# Patient Record
Sex: Female | Born: 1955 | Race: White | Hispanic: No | Marital: Married | Smoking: Current every day smoker
Health system: Southern US, Community
[De-identification: ages and names within clinical notes are randomized; demographics above are authoritative.]

## PROBLEM LIST (undated history)

## (undated) DIAGNOSIS — M1990 Unspecified osteoarthritis, unspecified site: Secondary | ICD-10-CM

## (undated) DIAGNOSIS — K219 Gastro-esophageal reflux disease without esophagitis: Secondary | ICD-10-CM

## (undated) DIAGNOSIS — K644 Residual hemorrhoidal skin tags: Secondary | ICD-10-CM

## (undated) DIAGNOSIS — H269 Unspecified cataract: Secondary | ICD-10-CM

## (undated) DIAGNOSIS — Z800 Family history of malignant neoplasm of digestive organs: Secondary | ICD-10-CM

## (undated) DIAGNOSIS — J449 Chronic obstructive pulmonary disease, unspecified: Secondary | ICD-10-CM

## (undated) DIAGNOSIS — Z5189 Encounter for other specified aftercare: Secondary | ICD-10-CM

## (undated) DIAGNOSIS — E785 Hyperlipidemia, unspecified: Secondary | ICD-10-CM

## (undated) DIAGNOSIS — E119 Type 2 diabetes mellitus without complications: Secondary | ICD-10-CM

## (undated) DIAGNOSIS — I10 Essential (primary) hypertension: Secondary | ICD-10-CM

## (undated) HISTORY — DX: Hyperlipidemia, unspecified: E78.5

## (undated) HISTORY — DX: Family history of malignant neoplasm of digestive organs: Z80.0

## (undated) HISTORY — PX: FINGER SURGERY: SHX640

## (undated) HISTORY — PX: BACK SURGERY: SHX140

## (undated) HISTORY — DX: Residual hemorrhoidal skin tags: K64.4

## (undated) HISTORY — PX: ABDOMINAL HYSTERECTOMY: SHX81

## (undated) HISTORY — DX: Encounter for other specified aftercare: Z51.89

## (undated) HISTORY — DX: Gastro-esophageal reflux disease without esophagitis: K21.9

## (undated) HISTORY — PX: BLADDER SURGERY: SHX569

## (undated) HISTORY — PX: NECK SURGERY: SHX720

## (undated) HISTORY — PX: CARPAL TUNNEL RELEASE: SHX101

## (undated) HISTORY — DX: Unspecified cataract: H26.9

## (undated) HISTORY — PX: COLONOSCOPY: SHX174

---

## 1999-03-22 ENCOUNTER — Encounter (INDEPENDENT_AMBULATORY_CARE_PROVIDER_SITE_OTHER): Payer: Self-pay

## 1999-03-22 ENCOUNTER — Other Ambulatory Visit: Admission: RE | Admit: 1999-03-22 | Discharge: 1999-03-22 | Payer: Self-pay | Admitting: Gastroenterology

## 2000-03-05 ENCOUNTER — Ambulatory Visit (HOSPITAL_COMMUNITY): Admission: RE | Admit: 2000-03-05 | Discharge: 2000-03-05 | Payer: Self-pay | Admitting: Specialist

## 2000-03-09 ENCOUNTER — Ambulatory Visit (HOSPITAL_COMMUNITY): Admission: RE | Admit: 2000-03-09 | Discharge: 2000-03-09 | Payer: Self-pay | Admitting: Specialist

## 2000-03-09 ENCOUNTER — Encounter: Payer: Self-pay | Admitting: Specialist

## 2000-03-23 ENCOUNTER — Encounter: Payer: Self-pay | Admitting: Specialist

## 2000-03-23 ENCOUNTER — Ambulatory Visit (HOSPITAL_COMMUNITY): Admission: RE | Admit: 2000-03-23 | Discharge: 2000-03-23 | Payer: Self-pay | Admitting: Specialist

## 2000-04-13 ENCOUNTER — Ambulatory Visit (HOSPITAL_COMMUNITY): Admission: RE | Admit: 2000-04-13 | Discharge: 2000-04-13 | Payer: Self-pay | Admitting: Specialist

## 2000-04-13 ENCOUNTER — Encounter: Payer: Self-pay | Admitting: Specialist

## 2000-06-13 ENCOUNTER — Observation Stay (HOSPITAL_COMMUNITY): Admission: RE | Admit: 2000-06-13 | Discharge: 2000-06-14 | Payer: Self-pay | Admitting: Specialist

## 2000-06-13 ENCOUNTER — Encounter (INDEPENDENT_AMBULATORY_CARE_PROVIDER_SITE_OTHER): Payer: Self-pay | Admitting: Specialist

## 2000-06-13 ENCOUNTER — Encounter: Payer: Self-pay | Admitting: Specialist

## 2000-12-03 ENCOUNTER — Observation Stay (HOSPITAL_COMMUNITY): Admission: RE | Admit: 2000-12-03 | Discharge: 2000-12-04 | Payer: Self-pay | Admitting: Specialist

## 2000-12-03 ENCOUNTER — Encounter (INDEPENDENT_AMBULATORY_CARE_PROVIDER_SITE_OTHER): Payer: Self-pay

## 2000-12-03 ENCOUNTER — Encounter: Payer: Self-pay | Admitting: Specialist

## 2001-05-07 ENCOUNTER — Encounter (INDEPENDENT_AMBULATORY_CARE_PROVIDER_SITE_OTHER): Payer: Self-pay | Admitting: Gastroenterology

## 2001-05-23 ENCOUNTER — Encounter (INDEPENDENT_AMBULATORY_CARE_PROVIDER_SITE_OTHER): Payer: Self-pay | Admitting: Gastroenterology

## 2001-05-28 ENCOUNTER — Other Ambulatory Visit: Admission: RE | Admit: 2001-05-28 | Discharge: 2001-05-28 | Payer: Self-pay | Admitting: Gastroenterology

## 2001-05-28 ENCOUNTER — Encounter (INDEPENDENT_AMBULATORY_CARE_PROVIDER_SITE_OTHER): Payer: Self-pay | Admitting: Gastroenterology

## 2001-05-28 ENCOUNTER — Encounter (INDEPENDENT_AMBULATORY_CARE_PROVIDER_SITE_OTHER): Payer: Self-pay | Admitting: *Deleted

## 2001-07-15 ENCOUNTER — Encounter: Payer: Self-pay | Admitting: Specialist

## 2001-07-15 ENCOUNTER — Encounter: Admission: RE | Admit: 2001-07-15 | Discharge: 2001-07-15 | Payer: Self-pay | Admitting: Specialist

## 2001-08-06 ENCOUNTER — Ambulatory Visit: Admission: RE | Admit: 2001-08-06 | Discharge: 2001-08-06 | Payer: Self-pay | Admitting: Specialist

## 2001-08-12 ENCOUNTER — Encounter: Payer: Self-pay | Admitting: Specialist

## 2001-08-12 ENCOUNTER — Inpatient Hospital Stay (HOSPITAL_COMMUNITY): Admission: RE | Admit: 2001-08-12 | Discharge: 2001-08-16 | Payer: Self-pay | Admitting: Specialist

## 2001-08-13 ENCOUNTER — Encounter: Payer: Self-pay | Admitting: Specialist

## 2002-03-05 ENCOUNTER — Other Ambulatory Visit: Admission: RE | Admit: 2002-03-05 | Discharge: 2002-03-05 | Payer: Self-pay | Admitting: Obstetrics & Gynecology

## 2002-04-22 DIAGNOSIS — K644 Residual hemorrhoidal skin tags: Secondary | ICD-10-CM

## 2002-04-22 HISTORY — DX: Residual hemorrhoidal skin tags: K64.4

## 2004-09-07 ENCOUNTER — Ambulatory Visit: Payer: Self-pay | Admitting: Family Medicine

## 2004-11-09 ENCOUNTER — Ambulatory Visit: Payer: Self-pay | Admitting: Gastroenterology

## 2004-12-19 ENCOUNTER — Ambulatory Visit: Payer: Self-pay | Admitting: Family Medicine

## 2005-06-14 ENCOUNTER — Encounter: Admission: RE | Admit: 2005-06-14 | Discharge: 2005-06-14 | Payer: Self-pay | Admitting: Specialist

## 2005-11-29 ENCOUNTER — Ambulatory Visit: Payer: Self-pay | Admitting: Gastroenterology

## 2007-05-30 ENCOUNTER — Ambulatory Visit: Payer: Self-pay | Admitting: Internal Medicine

## 2007-09-05 ENCOUNTER — Ambulatory Visit: Payer: Self-pay | Admitting: Gastroenterology

## 2007-10-02 ENCOUNTER — Ambulatory Visit: Payer: Self-pay | Admitting: Gastroenterology

## 2007-10-19 DIAGNOSIS — K219 Gastro-esophageal reflux disease without esophagitis: Secondary | ICD-10-CM | POA: Insufficient documentation

## 2009-02-11 ENCOUNTER — Telehealth: Payer: Self-pay | Admitting: Gastroenterology

## 2009-03-09 ENCOUNTER — Ambulatory Visit: Payer: Self-pay | Admitting: Gastroenterology

## 2010-05-17 ENCOUNTER — Encounter: Admission: RE | Admit: 2010-05-17 | Discharge: 2010-06-30 | Payer: Self-pay | Admitting: Specialist

## 2010-10-14 ENCOUNTER — Ambulatory Visit: Admit: 2010-10-14 | Payer: Self-pay | Admitting: Gastroenterology

## 2010-10-14 ENCOUNTER — Other Ambulatory Visit: Payer: Self-pay | Admitting: Gastroenterology

## 2010-10-14 ENCOUNTER — Encounter (INDEPENDENT_AMBULATORY_CARE_PROVIDER_SITE_OTHER): Payer: Self-pay | Admitting: *Deleted

## 2010-10-14 ENCOUNTER — Ambulatory Visit
Admission: RE | Admit: 2010-10-14 | Discharge: 2010-10-14 | Payer: Self-pay | Source: Home / Self Care | Attending: Gastroenterology | Admitting: Gastroenterology

## 2010-10-14 DIAGNOSIS — I152 Hypertension secondary to endocrine disorders: Secondary | ICD-10-CM | POA: Insufficient documentation

## 2010-10-14 DIAGNOSIS — E1159 Type 2 diabetes mellitus with other circulatory complications: Secondary | ICD-10-CM | POA: Insufficient documentation

## 2010-10-14 DIAGNOSIS — R110 Nausea: Secondary | ICD-10-CM | POA: Insufficient documentation

## 2010-10-14 DIAGNOSIS — E785 Hyperlipidemia, unspecified: Secondary | ICD-10-CM

## 2010-10-14 DIAGNOSIS — E1169 Type 2 diabetes mellitus with other specified complication: Secondary | ICD-10-CM | POA: Insufficient documentation

## 2010-10-14 DIAGNOSIS — I10 Essential (primary) hypertension: Secondary | ICD-10-CM

## 2010-10-14 LAB — BASIC METABOLIC PANEL
BUN: 11 mg/dL (ref 6–23)
CO2: 29 mEq/L (ref 19–32)
Calcium: 10.2 mg/dL (ref 8.4–10.5)
Chloride: 101 mEq/L (ref 96–112)
Creatinine, Ser: 0.7 mg/dL (ref 0.4–1.2)
GFR: 97.7 mL/min (ref >60.00)
Glucose, Bld: 102 mg/dL — ABNORMAL HIGH (ref 70–99)
Potassium: 4.4 mEq/L (ref 3.5–5.1)
Sodium: 140 mEq/L (ref 135–145)

## 2010-10-14 LAB — CBC WITH DIFFERENTIAL/PLATELET
Basophils Absolute: 0 10*3/uL (ref 0.0–0.1)
Basophils Relative: 0.2 % (ref 0.0–3.0)
Eosinophils Absolute: 0.1 10*3/uL (ref 0.0–0.7)
Eosinophils Relative: 0.8 % (ref 0.0–5.0)
HCT: 42.2 % (ref 36.0–46.0)
Hemoglobin: 14.4 g/dL (ref 12.0–15.0)
Lymphocytes Relative: 21.9 % (ref 12.0–46.0)
Lymphs Abs: 2.6 10*3/uL (ref 0.7–4.0)
MCHC: 34.1 g/dL (ref 30.0–36.0)
MCV: 92.9 fl (ref 78.0–100.0)
Monocytes Absolute: 0.5 10*3/uL (ref 0.1–1.0)
Monocytes Relative: 4.2 % (ref 3.0–12.0)
Neutro Abs: 8.7 10*3/uL — ABNORMAL HIGH (ref 1.4–7.7)
Neutrophils Relative %: 72.9 % (ref 43.0–77.0)
Platelets: 470 10*3/uL — ABNORMAL HIGH (ref 150.0–400.0)
RBC: 4.54 Mil/uL (ref 3.87–5.11)
RDW: 13.7 % (ref 11.5–14.6)
WBC: 11.9 10*3/uL — ABNORMAL HIGH (ref 4.5–10.5)

## 2010-10-14 LAB — HEPATIC FUNCTION PANEL
ALT: 27 U/L (ref 0–35)
AST: 20 U/L (ref 0–37)
Albumin: 4.2 g/dL (ref 3.5–5.2)
Alkaline Phosphatase: 96 U/L (ref 39–117)
Bilirubin, Direct: 0.1 mg/dL (ref 0.0–0.3)
Total Bilirubin: 0.7 mg/dL (ref 0.3–1.2)
Total Protein: 7.5 g/dL (ref 6.0–8.3)

## 2010-10-14 LAB — SEDIMENTATION RATE: Sed Rate: 43 mm/hr — ABNORMAL HIGH (ref 0–22)

## 2010-10-14 LAB — TSH: TSH: 1.61 u[IU]/mL (ref 0.35–5.50)

## 2010-10-14 LAB — IBC PANEL
Iron: 81 ug/dL (ref 42–145)
Saturation Ratios: 18.5 % — ABNORMAL LOW (ref 20.0–50.0)
Transferrin: 312.4 mg/dL (ref 212.0–360.0)

## 2010-10-14 LAB — FERRITIN: Ferritin: 87.1 ng/mL (ref 10.0–291.0)

## 2010-10-14 LAB — VITAMIN B12: Vitamin B-12: 333 pg/mL (ref 211–911)

## 2010-10-14 LAB — LIPASE: Lipase: 15 U/L (ref 11.0–59.0)

## 2010-10-14 LAB — AMYLASE: Amylase: 21 U/L — ABNORMAL LOW (ref 27–131)

## 2010-10-17 LAB — FOLATE: Folate: 6.4 ng/mL (ref >5.9)

## 2010-10-18 ENCOUNTER — Ambulatory Visit (HOSPITAL_COMMUNITY)
Admission: RE | Admit: 2010-10-18 | Discharge: 2010-10-18 | Payer: Self-pay | Source: Home / Self Care | Attending: Gastroenterology | Admitting: Gastroenterology

## 2010-10-21 ENCOUNTER — Telehealth: Payer: Self-pay | Admitting: Gastroenterology

## 2010-11-03 NOTE — Letter (Signed)
Summary: EGD Instructions  [facility]  [address] [place], [place] [address]   Phone: [phone]  Fax: [phone]       KENZIE THORESON    24-Mar-1956    MRN: [id number]       Procedure Day /Date: Friday 11/04/2010     Arrival Time: 1:00pm     Procedure Time: 2:00pm     Location of Procedure:                    [facility] (4th Floor)   PREPARATION FOR ENDOSCOPY   On 11/04/2010 THE DAY OF THE PROCEDURE:  1.   No solid foods, milk or milk products are allowed after midnight the night before your procedure.  2.   Do not drink anything colored red or purple.  Avoid juices with pulp.  No orange juice.  3.  You may drink clear liquids until 12:00pm, which is 2 hours before your procedure.                                                                                                CLEAR LIQUIDS INCLUDE: Water Jello Ice Popsicles Tea (sugar ok, no milk/cream) Powdered fruit flavored drinks Coffee (sugar ok, no milk/cream) Gatorade Juice: apple, white grape, white cranberry  Lemonade Clear bullion, consomm, broth Carbonated beverages (any kind) Strained chicken noodle soup Hard Candy   MEDICATION INSTRUCTIONS  Unless otherwise instructed, you should take regular prescription medications with a small sip of water as early as possible the morning of your procedure.                OTHER INSTRUCTIONS  You will need a responsible adult at least 54 years of age to accompany you and drive you home.   This person must remain in the waiting room during your procedure.  Wear loose fitting clothing that is easily removed.  Leave jewelry and other valuables at home.  However, you may wish to bring a book to read or an iPod/MP3 player to listen to music as you wait for your procedure to start.  Remove all body piercing jewelry and leave at home.  Total time from sign-in until discharge is approximately 2-3 hours.  You should go home  directly after your procedure and rest.  You can resume normal activities the day after your procedure.  The day of your procedure you should not:   Drive   Make legal decisions   Operate machinery   Drink alcohol   Return to work  You will receive specific instructions about eating, activities and medications before you leave.    The above instructions have been reviewed and explained to me by   _______________________    I fully understand and can verbalize these instructions _____________________________ Date _________

## 2010-11-03 NOTE — Progress Notes (Signed)
Summary: Ultrasound results   Phone Note Call from Patient Call back at [phone]   Call For: Dr Jarold Motto Reason for Call: Lab or Test Results Initial call taken by: Leanor Kail [facility],  October 21, 2010 10:54 AM  Follow-up for Phone Call        Notified patient per Dr Jarold Motto, "no gallstones noted but findings consistent with fatty liver." Patient stated understanding and she was reminded of her procedure in February. Follow-up by: Graciella Freer RN,  October 21, 2010 11:44 AM

## 2010-11-03 NOTE — Assessment & Plan Note (Signed)
Summary: NAUSEA.Lori Schroeder.    History of Present Illness Visit Type: Follow-up Visit Primary GI MD: Sheryn Bison MD FACP FAGA Primary Provider: Phill Myron, FNP  Requesting Provider: na Chief Complaint: Severe nausea, and one episode of black colored stools  History of Present Illness:   pleasant 55 year old Caucasian female with chronic acid reflux and IBS. She now relates worsening nausea with vague epigastric discomfort relieved by p.o. and Percocet which she takes for chronic low back pain. She is on Nexium 40 mg twice a day. She denies typical reflux symptoms or dysphagia. She also denies any specific hepatobiliary or lower gastrointestinal problems, and she is up-to-date on her colonoscopy. Last endoscopic exam was 10 years ago. She does smoke regular but denies alcohol or NSAID abuse. Previous surgeries have included laminectomy and hysterectomy. Family history is noncontributory.  She denies systemic complaints such as fever, chills, anorexia or weight loss. There is no history of known hepatitis or pancreatitis.   GI Review of Systems    Reports nausea.      Denies abdominal pain, acid reflux, belching, bloating, chest pain, dysphagia with liquids, dysphagia with solids, heartburn, loss of appetite, vomiting, vomiting blood, weight loss, and  weight gain.        Denies anal fissure, black tarry stools, change in bowel habit, constipation, diarrhea, diverticulosis, fecal incontinence, heme positive stool, hemorrhoids, irritable bowel syndrome, jaundice, light color stool, liver problems, rectal bleeding, and  rectal pain.    Current Medications (verified): 1)  Nexium 40 Mg Cpdr (Esomeprazole Magnesium) .... Take 1 Tablet By Mouth Two Times A Day 2)  Lisinopril 20 Mg Tabs (Lisinopril) .Marland Kitchen.. 1 By Mouth Once Daily 3)  Lipitor(Dosage Unknown) .... One Tablet By Mouth Once Daily  Allergies (verified): No Known Drug Allergies  Past History:  Past medical, surgical, family and  social histories (including risk factors) reviewed for relevance to current acute and chronic problems.  Past Medical History: HYPERTENSION (ICD-401.9) HYPERLIPIDEMIA (ICD-272.4) NAUSEA (ICD-787.02) GERD (ICD-530.81) COLORECTAL CANCER, FAMILY HX (ICD-V16.0)  Past Surgical History: Back Surgery Hysterectomy Bladder tact  Family History: Reviewed history from 03/09/2009 and no changes required. Family History of Colon Cancer:MGF   Social History: Reviewed history from 03/09/2009 and no changes required. Occupation: disability Patient currently smokes.  less the a ppd Alcohol Use - no Illicit Drug Use - no Daily Caffeine Use  Review of Systems       The patient complains of back pain, fatigue, and headaches-new.  The patient denies allergy/sinus, anemia, anxiety-new, arthritis/joint pain, blood in urine, breast changes/lumps, change in vision, confusion, cough, coughing up blood, depression-new, fainting, fever, hearing problems, heart murmur, heart rhythm changes, itching, menstrual pain, muscle pains/cramps, night sweats, nosebleeds, pregnancy symptoms, shortness of breath, skin rash, sleeping problems, sore throat, swelling of feet/legs, swollen lymph glands, thirst - excessive , urination - excessive , urination changes/pain, urine leakage, vision changes, and voice change.    Vital Signs:  Patient profile:   56 year old female Height:      59 inches Weight:      153 pounds BMI:     31.01 BSA:     1.65 Pulse rate:   88 / minute Pulse rhythm:   regular BP sitting:   100 / 60  (left arm) Cuff size:   regular  Vitals Entered By: Ok Anis CMA (October 14, 2010 10:50 AM)  Physical Exam  General:  Well developed, well nourished, no acute distress.healthy appearing.   Head:  Normocephalic and atraumatic. Eyes:  PERRLA, no icterus.exam deferred to patient's ophthalmologist.   Neck:  Supple; no masses or thyromegaly. Lungs:  Clear throughout to auscultation. Heart:   Regular rate and rhythm; no murmurs, rubs,  or bruits. Abdomen:  Soft, nontender and nondistended. No masses, hepatosplenomegaly or hernias noted. Normal bowel sounds.mild That hepatomegaly noted without splenomegaly, other abdominal masses or tenderness or ascites. Msk:  Symmetrical with no gross deformities. Normal posture. Extremities:  No clubbing, cyanosis, edema or deformities noted. Neurologic:  Alert and  oriented x4;  grossly normal neurologically. Skin:  Intact without significant lesions or rashes. Cervical Nodes:  No significant cervical adenopathy. Psych:  Alert and cooperative. Normal mood and affect.   Impression & Recommendations:  Problem # 1:  NAUSEA (ICD-787.02) Assessment Deteriorated Rule Out cholelithiasis, H. gloria infection, enlarging hiatal hernia, versus IBS complaints. Labs have been ordered, ultrasound exam, and endoscopy. She is to continue reflux maneuvers or twice a day Nexium and PCA and q.h.s. Carafate suspension with p.r.n. Zofran 4 mg every 8-12 hours as needed. She is up-to-date on her colonoscopy exams. Orders: TLB-CBC Platelet - w/Differential (85025-CBCD) TLB-BMP (Basic Metabolic Panel-BMET) (80048-METABOL) TLB-Hepatic/Liver Function Pnl (80076-HEPATIC) TLB-TSH (Thyroid Stimulating Hormone) (84443-TSH) TLB-B12, Serum-Total ONLY (98119-J47) TLB-Ferritin (82728-FER) TLB-Folic Acid (Folate) (82746-FOL) TLB-IBC Pnl (Iron/FE;Transferrin) (83550-IBC) TLB-Amylase (82150-AMYL) TLB-Lipase (83690-LIPASE) TLB-Sedimentation Rate (ESR) (85652-ESR) EGD (EGD) Ultrasound Abdomen (UAS)  Problem # 2:  HYPERTENSION (ICD-401.9) Assessment: Improved Blood Pressure Today 100/60 on lisinopril 20 mg a day.  Problem # 3:  COLORECTAL CANCER, FAMILY HX (ICD-V16.0) Assessment: Unchanged colonoscopy followup as per clinical protocol.  Patient Instructions: 1)  Copy sent to : Phill Myron, FNP  2)  Your procedure has been scheduled for 11/04/2010, please follow the  seperate instructions.  3)  Please go to the basement today for your labs.  4)  Your prescription(s) have been sent to you pharmacy.  5)   Endoscopy Center Patient Information Guide given to patient.  6)  Upper Endoscopy brochure given.  7)  Your abdominal ultrasound is scheduled for 10/18/2010, please follow the seperate instructions.  8)  The medication list was reviewed and reconciled.  All changed / newly prescribed medications were explained.  A complete medication list was provided to the patient / caregiver. 9)  Avoid foods high in acid content ( tomatoes, citrus juices, spicy foods) . Avoid eating within 3 to 4 hours of lying down or before exercising. Do not over eat; try smaller more frequent meals. Elevate head of bed four inches when sleeping.  Prescriptions: ZOFRAN 4 MG TABS (ONDANSETRON HCL) take one by mouth every 8 hours as needed  #30 x 0   Entered by:   Harlow Mares CMA (AAMA)   Authorized by:   Mardella Layman MD [facility]   Signed by:   Harlow Mares CMA (AAMA) on 10/14/2010   Method used:   Electronically to        [facility] [phone]* (retail)       [address] [place], [place]  [address]       Ph: [phone] or [phone]       Fax: [phone]   RxID:   [id number] CARAFATE 1 GM/10ML SUSP (SUCRALFATE) take 10ml after meals and at bedtime.  #1289ml x 1   Entered by:   Harlow Mares CMA (AAMA)   Authorized by:   Mardella Layman MD [facility]   Signed by:   Hulan Saas  Kowalk CMA (AAMA) on 10/14/2010   Method used:   Electronically to        [facility] [phone]* (retail)       [address] [place], [place]  [address]       Ph: [phone] or [phone]       Fax: [phone]   RxID:   [id number]

## 2010-11-04 ENCOUNTER — Ambulatory Visit: Admit: 2010-11-04 | Payer: Self-pay | Admitting: Gastroenterology

## 2010-11-04 ENCOUNTER — Other Ambulatory Visit: Payer: Self-pay | Admitting: Gastroenterology

## 2010-11-11 ENCOUNTER — Encounter: Payer: Self-pay | Admitting: Gastroenterology

## 2010-11-11 ENCOUNTER — Other Ambulatory Visit: Payer: Self-pay | Admitting: Gastroenterology

## 2010-11-11 ENCOUNTER — Other Ambulatory Visit (AMBULATORY_SURGERY_CENTER): Payer: PRIVATE HEALTH INSURANCE | Admitting: Gastroenterology

## 2010-11-11 DIAGNOSIS — R110 Nausea: Secondary | ICD-10-CM

## 2010-11-11 DIAGNOSIS — D131 Benign neoplasm of stomach: Secondary | ICD-10-CM

## 2010-11-11 DIAGNOSIS — K219 Gastro-esophageal reflux disease without esophagitis: Secondary | ICD-10-CM

## 2010-11-14 LAB — HELICOBACTER PYLORI SCREEN-BIOPSY: UREASE: NEGATIVE

## 2010-11-15 ENCOUNTER — Encounter: Payer: Self-pay | Admitting: Gastroenterology

## 2010-11-16 ENCOUNTER — Telehealth: Payer: Self-pay | Admitting: Gastroenterology

## 2010-11-17 NOTE — Miscellaneous (Signed)
Summary: Orders Update  Clinical Lists Changes  Orders: Added new Test order of TLB-H Pylori Screen Gastric Biopsy (83013-CLOTEST) - Signed 

## 2010-11-17 NOTE — Procedures (Addendum)
Summary: Upper Endoscopy  Patient: [name] Note: All result statuses are Final unless otherwise noted.  Tests: (1) Upper Endoscopy (EGD)   EGD Upper Endoscopy       DONE     [facility]     [address]     [place], [place]  [address]           ENDOSCOPY PROCEDURE REPORT           PATIENT:  Lori Schroeder, Lori Schroeder  MR#:  [id number]     BIRTHDATE:  [birth year], 54 yrs. old  GENDER:  female           ENDOSCOPIST:  Vania Rea. Jarold Motto, MD, [facility]     Referred by:           PROCEDURE DATE:  11/11/2010     PROCEDURE:  EGD with biopsy, 08657     ASA CLASS:  Class II     INDICATIONS:  nausea, GERD RECENT ULTRASOUND WAS NORMAL.           MEDICATIONS:   Fentanyl 50 mcg IV, Versed 8 mg IV     TOPICAL ANESTHETIC:  Exactacain Spray           DESCRIPTION OF PROCEDURE:   After the risks benefits and     alternatives of the procedure were thoroughly explained, informed     consent was obtained.  The LB GIF-H180 [id number] endoscope was     introduced through the mouth and advanced to the second portion of     the duodenum, without limitations.  The instrument was slowly     withdrawn as the mucosa was fully examined.     <<PROCEDUREIMAGES>>           There were multiple polyps identified. in the fundus. HYPERPLASTIC     POLYPS BIOPSIED.  The upper, middle, and distal third of the     esophagus were carefully inspected and no abnormalities were     noted. The z-line was well seen at the GEJ. The endoscope was     pushed into the fundus which was normal including a retroflexed     view. The antrum,gastric body, first and second part of the     duodenum were unremarkable. CLO BX. DONE.    POLYPS.SEE PICTURES.     The scope was then withdrawn from the patient and the procedure     completed.           COMPLICATIONS:  None           ENDOSCOPIC IMPRESSION:     1) Polyps, multiple in the fundus     2) Normal EGD     NAUSEA FROM GERD,BETTER WITH PPI RX.R/O H.PYLORI,     RECOMMENDATIONS:  1) Await biopsy results     2) Rx CLO if positive     3) continue PPI           REPEAT EXAM:  No           ______________________________     Vania Rea. Jarold Motto, MD, Clement[facility] Graham           CC:  Rudi Heap, MDDonna Bevelyn Buckles, NP           n.     Rosalie DoctorMarland Kitchen   Vania Rea. Sie Formisano at 11/11/2010 02:55 PM           Greggory Stallion, [id number]  Note: An exclamation mark (!) indicates a result that was not dispersed into the flowsheet. Document  Creation Date: 11/11/2010 2:56 PM _______________________________________________________________________  (1) Order result status: Final Collection or observation date-time: 11/11/2010 14:48 Requested date-time:  Receipt date-time:  Reported date-time:  Referring Physician:   Ordering Physician: Sheryn Bison [phone]) Specimen Source:  Source: Launa Grill Order Number: [id number] Lab site:

## 2010-11-18 ENCOUNTER — Encounter (INDEPENDENT_AMBULATORY_CARE_PROVIDER_SITE_OTHER): Payer: Self-pay | Admitting: *Deleted

## 2010-11-18 ENCOUNTER — Other Ambulatory Visit: Payer: Self-pay | Admitting: Gastroenterology

## 2010-11-18 ENCOUNTER — Other Ambulatory Visit: Payer: PRIVATE HEALTH INSURANCE

## 2010-11-18 ENCOUNTER — Encounter: Payer: Self-pay | Admitting: Gastroenterology

## 2010-11-18 ENCOUNTER — Ambulatory Visit (INDEPENDENT_AMBULATORY_CARE_PROVIDER_SITE_OTHER): Payer: Medicare Other | Admitting: Gastroenterology

## 2010-11-18 ENCOUNTER — Telehealth: Payer: Self-pay | Admitting: Gastroenterology

## 2010-11-18 ENCOUNTER — Ambulatory Visit (INDEPENDENT_AMBULATORY_CARE_PROVIDER_SITE_OTHER)
Admission: RE | Admit: 2010-11-18 | Discharge: 2010-11-18 | Disposition: A | Discharge status: Home / Self Care | Payer: PRIVATE HEALTH INSURANCE | Source: Ambulatory Visit | Attending: Gastroenterology | Admitting: Gastroenterology

## 2010-11-18 DIAGNOSIS — R079 Chest pain, unspecified: Secondary | ICD-10-CM

## 2010-11-18 DIAGNOSIS — K219 Gastro-esophageal reflux disease without esophagitis: Secondary | ICD-10-CM

## 2010-11-18 DIAGNOSIS — R109 Unspecified abdominal pain: Secondary | ICD-10-CM

## 2010-11-18 HISTORY — DX: Essential (primary) hypertension: I10

## 2010-11-18 LAB — CBC WITH DIFFERENTIAL/PLATELET
Basophils Relative: 0.6 % (ref 0.0–3.0)
Eosinophils Relative: 1 % (ref 0.0–5.0)
Hemoglobin: 14.3 g/dL (ref 12.0–15.0)
Lymphocytes Relative: 23.7 % (ref 12.0–46.0)
MCHC: 33.9 g/dL (ref 30.0–36.0)
Monocytes Relative: 4.9 % (ref 3.0–12.0)
Neutro Abs: 7.3 10*3/uL (ref 1.4–7.7)
Neutrophils Relative %: 69.8 % (ref 43.0–77.0)
RBC: 4.52 Mil/uL (ref 3.87–5.11)
WBC: 10.4 10*3/uL (ref 4.5–10.5)

## 2010-11-18 LAB — HEPATIC FUNCTION PANEL
Albumin: 3.9 g/dL (ref 3.5–5.2)
Alkaline Phosphatase: 98 U/L (ref 39–117)
Total Protein: 7 g/dL (ref 6.0–8.3)

## 2010-11-18 MED ORDER — IOHEXOL 300 MG/ML  SOLN
100.0000 mL | Freq: Once | INTRAMUSCULAR | Status: AC | PRN
  Administered 2010-11-18: 100 mL via INTRAVENOUS

## 2010-11-23 NOTE — Progress Notes (Signed)
Summary: Medication   Phone Note From Pharmacy   Caller: [facility] [phone] Call For: Dr. Jarold Motto  Summary of Call: Needs to verify the quanity and has questions about the Lidocaine Initial call taken by: Karna Christmas,  November 18, 2010 10:43 AM  Follow-up for Phone Call        rx verified Follow-up by: Harlow Mares CMA Duncan Dull),  November 18, 2010 11:34 AM

## 2010-11-23 NOTE — Letter (Signed)
Summary: Patient Notice-Endo Biopsy Results  [facility]  [address] [place], [place] [address]   Phone: [phone]  Fax: [phone]        November 15, 2010 MRN: [id number]    [facility] [address] [place], [place]  [address]    Dear Lori Schroeder,  I am pleased to inform you that the biopsies taken during your recent endoscopic examination did not show any evidence of cancer upon pathologic examination.Biopsies were negative for H. pylori infection.  Additional information/recommendations:  __No further action is needed at this time.  Please follow-up with      your primary care physician for your other healthcare needs.  __ Please call [phone] to schedule a return visit to review      your condition.  _xx_ Continue with the treatment plan as outlined on the day of your      exam.  __ You should have a repeat endoscopic examination for this problem              in _ months/years.   Please call us if you are having persistent problems or have questions about your condition that have not been fully answered at this time.  Sincerely,  Mardella Layman MD [facility]  This letter has been electronically signed by your physician.  Appended Document: Patient Notice-Endo Biopsy Results Letter Mailed  Appended Document: Patient Notice-Endo Biopsy Results Letter Mailed

## 2010-11-23 NOTE — Assessment & Plan Note (Signed)
Summary: Upper abd pain since EGD     History of Present Illness Visit Type: Follow-up Visit Primary GI MD: Sheryn Bison MD FACP FAGA Primary Provider: Phill Myron, FNP  Requesting Provider: na Chief Complaint: Pt c/o upper abd pain that radiates to her back since her EGD on 2-10 History of Present Illness:   55 year old Caucasian female status post endoscopy one week ago for evaluation of nausea. Since her endoscopy she complains of a persistent discomfort in the subxiphoid area with some radiation into her back and some worsening with deep inspiration. She's had no painful swallowing or dysphagia. She had been on PPI medication and Carafate suspension without improvement. She denies fever, chills, other cardiopulmonary or systemic complaints. She does not have a cough, sputum production, shortness of breath or wheezing.  Recent upper abdominal ultrasound exam was negative. Labs also been unremarkable.Biopsy for H. pylori was negative. There is no esophageal dilatation performed.   GI Review of Systems    Reports abdominal pain.     Location of  Abdominal pain: upper abdomen.    Denies acid reflux, belching, bloating, chest pain, dysphagia with liquids, dysphagia with solids, heartburn, loss of appetite, nausea, vomiting, vomiting blood, weight loss, and  weight gain.        Denies anal fissure, black tarry stools, change in bowel habit, constipation, diarrhea, diverticulosis, fecal incontinence, heme positive stool, hemorrhoids, irritable bowel syndrome, jaundice, light color stool, liver problems, rectal bleeding, and  rectal pain.    Current Medications (verified): 1)  Nexium 40 Mg Cpdr (Esomeprazole Magnesium) .... Take 1 Tablet By Mouth Two Times A Day 2)  Lisinopril 20 Mg Tabs (Lisinopril) .Marland Kitchen.. 1 By Mouth Once Daily 3)  Lipitor(Dosage Unknown) .... One Tablet By Mouth Once Daily 4)  Carafate 1 Gm/27ml Susp (Sucralfate) .... Take 10ml After Meals and At Bedtime. 5)  Zofran  4 Mg Tabs (Ondansetron Hcl) .... Take One By Mouth Every 8 Hours As Needed  Allergies (verified): No Known Drug Allergies  Past History:  Past medical, surgical, family and social histories (including risk factors) reviewed for relevance to current acute and chronic problems.  Past Medical History: Reviewed history from 10/14/2010 and no changes required. HYPERTENSION (ICD-401.9) HYPERLIPIDEMIA (ICD-272.4) NAUSEA (ICD-787.02) GERD (ICD-530.81) COLORECTAL CANCER, FAMILY HX (ICD-V16.0)  Past Surgical History: Reviewed history from 10/14/2010 and no changes required. Back Surgery Hysterectomy Bladder tact  Family History: Reviewed history from 10/14/2010 and no changes required. Family History of Colon Cancer:MGF   Social History: Reviewed history from 03/09/2009 and no changes required. Occupation: disability Patient currently smokes.  less the a ppd Alcohol Use - no Illicit Drug Use - no Daily Caffeine Use  Review of Systems       The patient complains of back pain.  The patient denies allergy/sinus, anemia, anxiety-new, arthritis/joint pain, blood in urine, breast changes/lumps, change in vision, confusion, cough, coughing up blood, depression-new, fainting, fatigue, fever, headaches-new, hearing problems, heart murmur, heart rhythm changes, itching, menstrual pain, muscle pains/cramps, night sweats, nosebleeds, pregnancy symptoms, shortness of breath, skin rash, sleeping problems, sore throat, swelling of feet/legs, swollen lymph glands, thirst - excessive , urination - excessive , urination changes/pain, urine leakage, vision changes, and voice change.    Vital Signs:  Patient profile:   55 year old female Height:      59 inches Weight:      158 pounds BMI:     32.03 BSA:     1.67 Pulse rate:   88 / minute Pulse  rhythm:   regular BP sitting:   128 / 74  (left arm) Cuff size:   regular  Vitals Entered By: Ok Anis CMA (November 18, 2010 9:22 AM)  Physical  Exam  General:  Well developed, well nourished, no acute distress.healthy appearing.   Head:  Normocephalic and atraumatic. Eyes:  PERRLA, no icterus. Neck:  Supple; no masses or thyromegaly. Lungs:  Clear throughout to auscultation. Heart:  Regular rate and rhythm; no murmurs, rubs,  or bruits. Abdomen:  Soft, nontender and nondistended. No masses, hepatosplenomegaly or hernias noted. Normal bowel sounds. Cervical Nodes:  No significant cervical adenopathy. Psych:  Alert and cooperative. Normal mood and affect.   Impression & Recommendations:  Problem # 1:  CHEST PAIN (ICD-786.50) Assessment New Unusual pain most consistent with esophagitis. I doubt that this patient has a perforation, but will proceed with CT scan of abdomen and chest and repeat labs. She is to continue PPI therapy and to use p.r.n. viscous Xylocaine p.o. Orders: TLB-Hepatic/Liver Function Pnl (80076-HEPATIC) TLB-CRP-High Sensitivity (C-Reactive Protein) (86140-FCRP) TLB-CBC Platelet - w/Differential (85025-CBCD) TLB-Sedimentation Rate (ESR) (85652-ESR) GI Misc Procedure/ Radiology Order (GI Misc )  Problem # 2:  HYPERTENSION (ICD-401.9) Assessment: Improved blood pressure today normal at 128/74 pulse is 88 and regular. She is to continue lisinopril 20 mg a day.  Patient Instructions: 1)  Copy sent to : Phill Myron, FNP  2)  Your CT Scan is scheduled for today 11/18/2010, please follow the seperate instructions.  3)  Please go to the basement today for your labs.  4)  Your prescription(s) have been sent to you pharmacy.  5)  The medication list was reviewed and reconciled.  All changed / newly prescribed medications were explained.  A complete medication list was provided to the patient / caregiver. Prescriptions: LIDOCAINE VISCOUS 2 % SOLN (LIDOCAINE HCL) take 2 teaspoons every 2-4 hours  #1 month x 0   Entered by:   Harlow Mares CMA (AAMA)   Authorized by:   Mardella Layman MD [facility]   Signed by:    Harlow Mares CMA (AAMA) on 11/18/2010   Method used:   Electronically to        [facility] [phone]* (retail)       [address] [place], [place]  [address]       Ph: [phone] or [phone]       Fax: [phone]   RxID:   [id number] TRAMADOL HCL 50 MG TABS (TRAMADOL HCL) take one by mouth every 6-8 hours.  #60 x 0   Entered by:   Harlow Mares CMA (AAMA)   Authorized by:   Mardella Layman MD [facility]   Signed by:   Harlow Mares CMA (AAMA) on 11/18/2010   Method used:   Faxed to ...       [facility] [phone]* (retail)       [address] [place], [place]  [address]       Ph: [phone] or [phone]       Fax: [phone]   RxID:   [id number]

## 2010-11-23 NOTE — Progress Notes (Signed)
Summary: Hurting since Endo   Phone Note Call from Patient Call back at [phone]   Call For: Dr Jarold Motto Reason for Call: Talk to Nurse Summary of Call: Has been hurting since friday after her Endo.  Initial call taken by: Leanor Kail [facility],  November 16, 2010 4:52 PM  Follow-up for Phone Call        Patient reports intermittent midsternal pain that radiates to her back several times a day that started after her EGD on Friday, 11/11/10.   Saturday the pain was so bad she thought she was having a heart attack. Patient is on Nexium and Carafate per instructions. Patient stated food doesn't matter- she cut out fried and spicey foods w/o help. ABD U/S on 10/18/10 showed nothing acute, small hepatic cyst. ENDO on 11/11/10 showed polyps in fundus and CLO negative. Please advise. Graciella Freer RN  November 17, 2010 8:34 AM   Patient stopped Reglan about 6 months ago d/t commercials on TV. Graciella Freer RN  November 17, 2010 8:41 AM   Additional Follow-up for Phone Call Additional follow up Details #1::         see tomorrow...drp Additional Follow-up by: Mardella Layman MD [facility],  November 17, 2010 9:47 AM    Additional Follow-up for Phone Call Additional follow up Details #2::    Scheduled patient for am appointment . Follow-up by: Graciella Freer RN,  November 17, 2010 10:28 AM

## 2011-02-14 NOTE — Assessment & Plan Note (Signed)
 HEALTHCARE                         GASTROENTEROLOGY OFFICE NOTE   NAME:Pollet, Nadra                          MRN:          [id number]  DATE:05/30/2007                            DOB:          1956/08/26    Ms. Hopson is a 55 year old white female with a family history of colon  cancer in her mother and maternal grandfather who has also chronic  gastroesophageal reflux disease, last endoscopy in 2000.  She is  dependent on Nexium 40 mg a day and Reglan 10 mg a day with complete  relief of her reflux symptoms.  Only on occasion she has additional  reflux.  She is a smoker.  She is also overweight and has been started  on Fosamax for osteopenia which seems to aggravate her symptoms of  reflux.  Last colonoscopy in July 2003, was essentially normal.  She is  due for another colonoscopy.   PHYSICAL EXAMINATION:  VITAL SIGNS:  Blood pressure 118/72, pulse 68,  weight 158 pounds.  GENERAL:  She was alert, oriented, no distress.  LUNGS:  Clear to auscultation.  COR:  Normal S1, normal S2.  ABDOMEN:  Soft, nontender.  Normoactive bowel sounds.  Liver edge at the  costal margin.  RECTAL:  Not done.   IMPRESSION:  29. A 55 year old white female with a family history of colon cancer,      who is due for a  for a repeat colonoscopy.  2. Gastroesophageal reflux disease with positive 24-hour      interesophageal pH probe  in the past,showing a total De Meester      score of 58, normal being less than 14, with normal upper endoscopy      in 2000.   PLAN:  1. Colonoscopy scheduled.  2. Refills for Nexium and Reglan.  3. Stop smoking.  4. Weight loss.     Hedwig Morton. Juanda Chance, MD  Electronically Signed    DMB/MedQ  DD: 05/30/2007  DT: 05/31/2007  Job #: [id number]

## 2011-02-17 NOTE — Discharge Summary (Signed)
[place]. [facility]  Patient:    Lori, Schroeder Visit Number: [id number] MRN: [id number]          Service Type: SUR Location: 5000 [id number] Attending Physician:  Pierce Crane Dictated by:   Marcie Bal Troncale, P.A.C. Admit Date:  08/12/2001 Discharge Date: 08/16/2001                             Discharge Summary  DISCHARGE DIAGNOSES: 1. Degenerative disc disease with L4-L5. 2. Acute blood loss anemia. 3. Epidural fibrosis at L4-L5. 4. Status post microdiskectomy at L4-L5 x 2. 5. Reflux disease.  PROCEDURE:  Redo lumbar decompression at L4-L5, lateral recess decompression, foraminotomies at L4 and L5.  Redo diskectomy.  Posterior lumbar interbody fusion utilizing Brantigan cages supplemented with autogenous and allograft bone.  Posterolateral fusion at L4-L5 utilizing pedicle screw instrumentation and lateral bone grafting, DePuy AcroMed pedicle screw system performed by Dr. Shelle Iron with the assistance of Dr. Noel Gerold and Dorie Rank, P.A.-C on August 12, 2001.  Please see operative summary for further details.  CONSULTATIONS:  None.  LABORATORY DATA AND X-RAY FINDINGS:  Preoperative EKG from August 06, 2001, showed normal sinus rhythm and normal EKG.  A tracing postop showed sinus tachycardia, otherwise normal EKG.  X-rays postop showed satisfactory postop appearance at the L4-L5 fusion.  Preoperative H&H were 12.8 and 37.3 respectively.  This reached a low on November 12, at 7.6 and 22.4.  After her transfusion, she remained stable ranging from 8.7 to 9.8 on the hemoglobin. Her routine chemistry postop showed a sodium of 136, potassium 3.9 and a BUN and creatinine of 8 and 0.8 respectively.  She had no growth with urine culture.  CHIEF COMPLAINT:  Continued back pain and bilateral leg pain.  HISTORY OF PRESENT ILLNESS:  Lori Schroeder is a 55 year old female who presents with persistent worsening of back and bilateral leg pain.  She had  undergone previous back surgeries x 2 at the L4-L5 level in 2001 and 2002.  She has some symptomatic relief at that time, but had not had recurrence of her back pain. She had tried conservative measures including selective nerve root blocks, antiinflammatory medications and various analgesic medications without improvement.  Because of her continued symptoms and the findings on discogram, it is recommended that she may benefit from surgical intervention, redo diskectomy and fusion.  She elected to undergo surgical intervention.  HOSPITAL COURSE:  Following this surgical procedure, the patient was taken to the PACU in stable condition and transferred to the orthopedic floor in good condition.  She did fairly well during her postoperative stay.  On postop day #1, she received two units of packed red blood cells for a low hemoglobin of 8.0.  This brought her hemoglobin back up and remained stable throughout and did not require any further transfusions.  Hemovac was removed on postop day #1.  The wound was examined on postop day #2 and subsequently found to be clean, dry and intact without signs or symptoms of infection.  She was fitted with a chair back brace by [employer].  Neurovascularly, she remained intact throughout her postoperative stay.  She was slow in regaining her bowel function, so she was kept on a clear liquid diet for the first two postoperative days.  She was on PCA initially for pain management and then subsequently switched over to p.o. Percocet with good pain control.  She did have a UA checked on  postop day #3 that showed no evidence of urinary tract infection.  By postop day #4, she was doing well with therapy.  Her pain was well-controlled and she was anxious and ready for discharge home.  DISPOSITION:  Discharged to home.  DIET:  Regular.  ACTIVITY:  No bending, stooping or lifting more than five pounds.  WOUND CARE:  Once daily dry dressing changes to the back.  She  may shower on postop day #5.  DISCHARGE MEDICATIONS: 1. Percocet 5/325 one to two p.o. q.4-6h. p.r.n. pain. 2. Robaxin 500 mg p.o. q.8h. p.r.n. spasm. 3. Nexium 40 mg p.o. q.a.m. 4. Axid 300 mg p.o. q.p.m. 5. Reglan 10 mg p.o. q.h.s. 6. Zoloft 50 mg p.o. q.h.s.  FOLLOWUP:  She is to follow up with Dr. Shelle Iron in one week.  She is to call [phone], for an appointment.  CONDITION ON DISCHARGE:  Good and improved. Dictated by:   Marcie Bal Troncale, P.A.C. Attending Physician:  Pierce Crane DD:  09/02/01 TD:  09/03/01 Job: [id number]

## 2011-02-17 NOTE — Op Note (Signed)
[facility]  Patient:    Lori Schroeder, GUSE Visit Number: [id number] MRN: [id number]          Service Type: SUR Location: 5000 [id number] Attending Physician:  Pierce Crane Dictated by:   Javier Docker, M.D. Proc. Date: 08/12/01 Admit Date:  08/12/2001                             Operative Report  PREOPERATIVE DIAGNOSIS:  Degenerative disc disease L4-5, lateral recess stenosis and epidural fibrosis, recurrent disc protrusion.  POSTOPERATIVE DIAGNOSIS:  Degenerative disc disease L4-5, lateral recess stenosis and epidural fibrosis, recurrent disc protrusion.  OPERATION:  Redo lumbar decompression L4-5, lateral recess decompression, foraminotomies L4 and L5.  Redo diskectomy. Posterior lumbar interbody fusion utilizing Brannigan cages supplemented with autogenous and Allograft bone. Posterior lateral fusion L4-5 utilizing pedicle screw instrumentation and lateral bone grafting, DePuy accruement pedicle screw system.  SURGEON:  Javier Docker, M.D.  ASSISTANT:  Patricia Nettle, M.D./Julie Payton Emerald, P.A.  BRIEF HISTORY AND INDICATIONS:  A 55 year old status post two diskectomies and four to five persistent remaining back pain and lower extremity pain. Discography confirming concordant pain and abnormal morphology at L4-5.  The patient developing more recent bilateral lower extremity, buttock and leg pain indicating further settling at L4-5 lateral recess stenosis.  Operative intervention is indicated for decompression and lumbar stabilization and fusion.  The risks and benefits including infection, CSF leakage, epidural fibrosis, persistent pain, need for repeat procedure were explained.  DESCRIPTION OF PROCEDURE:  With the patient in the supine position.  After administration of adequate general anesthesia, 1 gm of Kefzol and 400 mg of ciprofloxacin, the lumbar region was prepped and draped in the usual sterile fashion.  Incision was made from the  spinous process of 2 to the sacrum.  The subcutaneous tissue was dissected.  Electrocautery was utilized to achieve hemostasis.  Diffuse bleeding throughout the case.  Dorsal lumbar fascia was identified and divided in line with the skin incision.  Paraspinous muscle was elevated from the lamina.  Kochers were placed, initially we were at L2-3. Due to hyperlordosis of her spine, the incision was centered over the preexisting surgical incision at 4-5 and the scar tissue was excised. Paraspinous muscle was elevated from the lamina of S1, 5, 4 and 3 bilaterally. This is over the facet at 4-5 identifying the transverse processes of 4 and 5. Self retaining retractors were placed.  Kochers were placed over 4 and 5 and confirmed with x-ray.  Central decompression was then performed at 4-5 with removal of interspinous ligament with a rongeur, skeletonizing 4 and 5.  The spinous processes of 4 and 5 were then removed with a Leksell rongeur and saved for further bone grafting.  Epidural fibrosis was noted on the left side consistent with previous diskectomy.  Then 2 mm Kerrisons were utilized to enlarge the laminotomy bilaterally freeing up the scar tissue and residual ligamentum flavum.  In the lateral recesses bilaterally were significant stenotic with lateral recess stenosis.  A 2 mm Kerrison was utilized to decompress the lateral recess bilaterally. Foraminotomies were performed at 4-5 bilaterally.  They were both fairly stenotic especially on the left. Bipolar electrocautery was utilized to achieve strict hemostasis.  Attention was turned first to the right at 4-5.  Thecal sac was immobilized medially. Bipolar electrocautery was utilized to achieve hemostasis and with retractors protecting 4-5 root.  After the disc space was confirmed, hemifacetectomy  was performed and diskectomy was performed.  There was a slight disc herniation noted here.  The disc space was curetted and irrigated. Dilators  were utilized to a 9 mm to be optimal and this was confirmed with x-ray to a 25 mm depth. The curet and the brooch was utilized for a 9 Brannigan cage again with the neural elements protected at all times.  The end plates were inspected and recuretted with a forward angle curet.  Dural diskectomy was performed.  With a 9 spacer in, attention was turned to the left side and there was significant epidural fibrosis here.  This required significant mobilization.  Following immobilization medially, preserving the 4 and 5 roots an annulotomy was performed and copious portion of the disc material was removed from the disc space.  Again the disc space was prepared in a similar manner after dilation to a 9 mm.  It was felt that a 9 x 21 cage would be appropriate.  After preparation of the end plates and copious irrigation of the disc space with antibiotic irrigation, a 9 x 21 cage was then inserted after packing with bone graft.  This was combination of right medical Allomatrix bone putty with cancellus bone obtained from the facets from the partial median hemifacetectomies that performed bilaterally as well as the spinous processes. This cage was inserted first left and then on the right after the spacer was removed, augmentation with appropriate positioning of the cage the posterior third of the vertebral body.  Copiously irrigated and then removed.  The nerve roots had been well preserved.  These were protected at all times.  Next, the pars of 5 were identified bilaterally.  The facets were converged using the 4-5 transverse processes in the outer aspect of the facet.  Pedicles were then localized at 4 and 5 utilizing the transverse process and the periarticular process in the center of it converging 30 degrees. Pedicle was felt medially with a Hockey stick probe at all times.  This was utilized by augmentation and preoperative radiographs.  Each were found to be a depth of 40 measured with  a ball tip probe and then each were tapped with a 55 tap and a ball tip probe and each was placed.  Again finding throughout the pedicle throughout.  40 x  62 screws were then inserted at 4 and 5 bilaterally, excellent purchase and then augmentation was in the middle of the pedicle appropriate convergence on the AP.  Following this, the bone graft and the combination of the local bone graft cancellus taken from the facet, the lamina and the spinous process augmented with an Allomatrix bone putty was placed out into the lateral recess, where pressure was placed out between the transversus process on the outer aspect of the facets.  Short straight rods were then placed connecting the pedicles and they were locked in with a locked system and torked to 80 foot pounds.  Prior to this, compression device was utilized to compress the cages.  This was noted to be excellent placement on the AP and lateral. Foramen of 4 and 5 found to be widely patent. There was no residual material compressing the roots or within the canal.  There was no active bleeding at this point.  There was a fair number of facet bleeders noted throughout the case and generalized oozing.  The cell saver was utilized throughout the case.  Next, the remainder of the bone graft was placed beneath the rod in the facets.  Thrombin soaked Gelfoam was placed in the laminotomy defect. There was no active bleeding or ooze. Copious irrigation with antibiotic was utilized throughout the case and into the disc spaces prior to insertion of the cages.  Retractors were then removed.  Parous spinous muscle inspected. Electrocautery utilized to achieve hemostasis.  At 4-5 the paraspinous muscle was reapproximated with 0 Vicryl simple sutures.  Lumbar fascia was then reapproximated with #1 Vicryl with interrupted figure-of-eight sutures as well as oversewn with a #1 Vicryl running suture.  Hemovac was then placed and brought out through a  lateral stab wound in the skin.  Subcutaneous tissue was reapproximated with 2-0 Vicryl simple sutures and the skin closed with staples.  The wound was dressed sterilely.  The patient was placed supine on the hospital bed and extubated without difficulty.  Transported in satisfactory condition.  The patient tolerated the procedure well with no complications.  Blood loss was 1200 cc.  We gave her about 550 cc back in cell saver.  Complete urine output throughout the case as well as good pressure. Dictated by:   Javier Docker, M.D. Attending Physician:  Pierce Crane DD:  08/12/01 TD:  08/13/01 Job: [id number]

## 2011-02-17 NOTE — Op Note (Signed)
[facility]  Patient:    Lori Schroeder, Lori Schroeder                        MRN: [id number] Proc. Date: 12/03/00 Adm. 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  OPERATIVE PROCEDURE:  Redo microdiskectomy L4-5 left.  ANESTHESIA:  General.  SURGEON:  Javier Docker, M.D.  ASSISTANT:  Georges Lynch. Darrelyn Hillock, M.D.  INDICATIONS:  55 year old with recurrent disk herniation refractory radicular pain in the L5 nerve distribution. Operative intervention was indicated for redo diskectomy. We discussed the possibility of recurrent disk herniation, need for fusion, minimal back, no instability on flexion and extension. The risks and benefits discussed including bleeding, infection, damage to vascular structures, no change in symptoms, again need for repeat diskectomy or fusion, CSF leak and fibrosis.  DESCRIPTION OF PROCEDURE:  With the patient in supine position and after induction of adequate general anesthesia, 1 g of Kefzol given for antimicrobial prophylaxis, the patient was placed prone on the Andrews frame and all bony prominences were well padded. The lumbar region was prepped and draped in the usual sterile fashion. Marcaine 0.25% with epinephrine was infiltrated into the subcutaneous tissues. The previous surgical scar was excised. The subcutaneous tissue was sharply dissected. Electrocautery was used to achieve hemostasis in the fascia. The lumbar fascia was identified and divided in line of the skin incision. A McCullough retractor was then placed after the paraspinous muscles were elevated from the lamina of L4 and L5. First radiograph obtained indicated the L3-4 space, second radiograph obtained indicated the L4-5 space. Next, the epidural fibrosis was  gently mobilized from the inferior edge of the lamina of L4 and superior edge of lamina of L5. This was done with a straight curet. The laminotomy was expanded utilizing the 2 mm Kerrison cephalad and distally. The foraminotomy was expanded of L5 and the pedicle of L5 was identified. The nerve root of L5 was identified and gently mobilized medially. There was significant tension noted upon the nerve root with moderate recurrent disk herniation noted with associated epidural fibrosis. The nerve root was gently mobilized medially checking the L4 and L5 roots and the disk herniation was noted. This had slightly migrated caudad. An annulotomy was performed and a copious portion of disk material was removed from the inner space and the extruded fragment. This was mobilized with a hockey and an Epstein and multiple small fragments were retrieved with the micropituitary. This was extended laterally as well as medially protecting the neural elements at all times. Following this, the nerve root of L5 was free and mobilized greater than 1 cm medially and laterally to the pedicle. A hockey stick probe was placed in the foramen of L4 and L5 and found to be widely patent and also beneath the thecal sac and the axilla without any evidence of any residual nerve root compression. The disk space was then copiously irrigated. Bipolar electrocautery was utilized to achieve hemostasis with no evidence of active bleeding or CSF leakage. Thrombin-soaked Gelfoam was placed in the laminotomy defect. The McCullough retractor was removed after the operating microscope which had been used throughout the case was removed. The paraspinous muscles were inspected  with no evidence of active bleeding. The dorsolumbar fascia was reapproximated with #1 Vicryl interrupted figure-of-eight sutures, the subcutaneous tissues were reapproximated with 2-0 Vicryl simple sutures, the skin was reapproximated with 4-0  Prolene subcuticular, reinforced with Steri-Strips and a sterile dressing was applied. The patient was placed supine on the hospital bed, extubated without difficulty and transported to the recovery room in satisfactory condition. The patient tolerated the procedure well with no immediate complications. DD:  12/03/00 TD:  12/04/00 Job: [id number]

## 2011-02-17 NOTE — H&P (Signed)
[facility]  Patient:    Lori Schroeder, VOKES Visit Number: [id number] MRN: [id number]          Service Type: Attending:  Javier Docker, M.D. Dictated by:   Alexzandrew L. Perkins, P.A.-C. Adm. Date:  08/14/01                           History and Physical  CHIEF COMPLAINT:  Continued back pain and bilateral leg pain.  HISTORY OF PRESENT ILLNESS:  The patient is a 55 year old female, well known to Dr. Jene Every and [facility].  She has a previous history of undergoing back surgery back in September 2001, and again in March 2002, for microdiskectomy at the same level of L4-5.  She did have symptomatic relief with her previous surgeries, however, had recurring back pain following both surgeries.  She was seen and has been treated conservatively since her last surgery.  She has undergone bilateral selective nerve root block.  She has undergone different anti-inflammatories and also analgesic medications. This has been ongoing for several months now since her previous surgery.  She has undergo a diskogram and continues to follow up with little relief.  She was found to have an annular tearing posteriorly with extrusion of contrast on the diskogram at the L4-5 space and also some narrowing of the lateral recess. She has been found to have also some epidural fibrosis at the previous site of L4-5.  Due to the fact that the patient has not improved with previous surgeries and also conservative measures following her two previous surgeries, it is felt she could benefit from undergoing a redo diskectomy at L4-5 and posterolateral fusions at L4-5.  The risks and benefits of this procedure have been discussed with the patient, and she has elected to proceed with surgery.  ALLERGIES:  No known drug allergies.  CURRENT MEDICATIONS: 1. Nexium 40 mg p.o. q.a.m. 2. Axid 300 mg p.o. q.p.m. 3. Reglan 10 mg p.o. q.h.s. 4. Zoloft 50 mg p.o. q.h.s. 5.  Percocet for pain p.r.n. 6. Methocarbamol for spasm p.r.n.  PAST MEDICAL HISTORY: 1. Reflux disease. 2. Degenerative disk disease with previous disk ruptures.  PAST SURGICAL HISTORY: 1. Hysterectomy in 1983. 2. Bladder tack procedure in 1989. 3. Microdiskectomy at L4-06 June 2000. 4. Redo microdiskectomy L4-04 December 2000.  SOCIAL HISTORY:  She is married, two children.  Denies use of tobacco products or alcohol products.  She does have a prior history of tobacco; however, she quit 21 years ago.  FAMILY HISTORY:  Mother living, age 87, with a history of hypertension. Father living, age 76, with a history of emphysema.  REVIEW OF SYSTEMS:  GENERAL:  No fevers, chills, night sweats.  NEUROLOGIC: No seizures, syncope, paralysis.  RESPIRATORY:  No shortness of breath, productive cough, or hemoptysis.  CARDIOVASCULAR:  No chest pain, angina, or orthopnea.  GASTROINTESTINAL:  No nausea, vomiting, diarrhea, constipation. No blood or mucous in the stool.  GENITOURINARY:  No dysuria, hematuria, or discharge.  MUSCULOSKELETAL:  Pertinent to that found in the back and history of present illness.  PHYSICAL EXAMINATION:  VITAL SIGNS:  Pulse 84, respirations 16, blood pressure 132/76.  GENERAL:  [age]-year-old white female, short in stature.  Well-nourished, well-developed.  Appears to be in no acute distress.  She is accompanied by her husband.  She appears to be an average historian.  She is alert, oriented, and cooperative.  HEENT:  Normocephalic, atraumatic.  Pupils are round and  reactive.  The patient wears glasses for reading, however, does not have them on at the time of visit.  EOMs are intact.  NECK:  Supple.  No carotid bruits.  CHEST:  Clear to auscultation in anterior and posterior chest walls.  No rhonchi or rales.  HEART:  Regular rate and rhythm.  No murmurs.  S1, S2 noted.  ABDOMEN:  Soft abdomen, nontender.  Bowel sounds are present.  No rebound,  no guarding.  RECTAL, GENITOURINARY:  Not done, not pertinent to present illness.  EXTREMITIES:  Back and lower extremities:  She has global limited range of motion of the lumbar spine with flexion and extension and rotation.  Straight leg raise on the left produces back pain and also reproduces the left leg pain on exam.  Motor function is intact to lower extremities bilaterally.  IMPRESSION: 1. Degenerative disk disease L4-5. 2. Epidural fibrosis L4-5. 3. Status post microdiskectomy L4-5 x 2. 4. Reflux disease.  PLAN:  The patient will be admitted to [facility] to undergo posterolateral fusion at L4-5 with repeat redo microdiskectomy, local versus iliac crest bone graft harvesting with AlloMatrix bone grafting perfusion. The surgery is to be performed by Dr. Jene Every. Dictated by:   Alexzandrew L. Perkins, P.A.-C. Attending:  Javier Docker, M.D. DD:  08/07/01 TD:  08/08/01 Job: [id number]

## 2011-02-17 NOTE — Op Note (Signed)
[facility]  Patient:    Lori Schroeder, Lori Schroeder                        MRN: [id number] Proc. Date: 06/13/00 Adm. Date:  [id number] Attending:  Pierce Crane                           Operative Report  PREOPERATIVE DIAGNOSES:  Herniated nucleus pulposus L4-L5 left.  POSTOPERATIVE DIAGNOSIS:  Herniated nucleus pulposus L4-L5 left.  PROCEDURE:  Microdiskectomy on L4-L5 left, decompression of lateral recess L4-L5 left.  ANESTHESIA:  General.  ASSISTANT:  Georges Lynch. Gioffre, M.D.  BRIEF HISTORY AND INDICATIONS:  A 55 year old with refractory L5 radiculopathy.  MRI indicating HNP, degenerative disk disease, and lateral recessed stenosis.  Operative intervention is indicated for decompression of the L5 nerve root.  Risks and benefits discussed including bleeding, infection, damage to surrounding structures, CSF leakage, recurrent disk herniation, need for fusion in the future, etc.  Preoperative examination revealed EHL weakness on the left, paresthesias in the L5 nerve root distribution.  DESCRIPTION OF PROCEDURE:  The patient is in supine position.  After the induction of adequate general anesthesia, 1 gram of Kefzol IV.  The patient was placed prone on the [place] frame.  All bony prominences were well padded. Lumbar region was prepped and draped in the usual sterile fashion.  Two 18 inch spinal needles were used to localize the L4-L5 interspace confirmed with x-ray.  Incision was made from the spinous process of L4-L5.  Subcutaneous tissue was dissected and electrocautery utilized to achieve strict hemostasis. Dorsal lumbar fascia identified and divided in line with the skin incision. Paraspinous muscles elevated from the lamina of L4-L5.  Marcaine 0.25% with epinephrine with infiltrated in the subcutaneous tissue.  Second radiograph obtained from the L4-L5 intralaminar space.  Next the 3 mm Kerrison was utilized to perform a hemilaminotomy of L4 on  the left.  A 2 mm Kerrison was utilized to remove the ligamentum flavum from the interspace.  ______ confirmed radiographs obtained with ______ just below the disk space.  An operating microscope was draped and brought into the surgical field.  Significant stenosis in lateral recess.  Foraminotomy with 3 mm Kerrison after the nerve root was identified and gently mobilized medially and protected at all times with a DErrico retractor that was intermittently released throughout the case utilizing gentle retraction of the root only. Bipolar electrocautery was utilized to achieve strict hemostasis over the expanse of epidural venous plexus noted.  Large HNP was noted L4-L5.  Central to the left.  Lateral recess was decompressed with 2 mm Kerrison.  The neural element well protected, laminotomy was performed with a 15 blade.  Copious amounts of disk material was removed from the interspace.  This was mobilized from beneath the annulus with an Epstein curette.  The end plates were not curetted.  A biting pituitary and then stripping pituitary were utilized to thoroughly remove any noted herniated disk material.  Nerve root release was performed throughout the case.  Disk space was copiously irrigated. Hockey-stick probe placed in the foramen at L4-L5 ______ following decompression.  Disk space again copiously irrigated and thrombin soaked Gelfoam was placed in the laminotomy defect for 5 minutes.  Achieved strict hemostasis.  The thrombin soaked Gelfoam was removed after bone wax was utilized for the bleeding of bony surfaces.  Inspection revealed no evidence of active bleeding or CSF leakage.  The wound was copiously irrigated again. Thrombin soaked Gelfoam was placed in the laminotomy defect.  L5 nerve root had been found to be erythematous and edematous and completely decompressed following diskectomy and foraminotomy and lateral recess decompression.  Next the [facility] retractor was  removed, the paraspinous muscles inspected. No evidence of active bleeding.  Dorsal lumbar fascia reapproximated with #1 Vicryl interrupted suture, subcutaneous tissue reapproximated with 2-0 Vicryl subcuticular suture.  Skin was reapproximated with 0 subcuticular PDS, reinforced with Steri-Strips.  The wound was dressed sterilely and patient placed supine on the hospital bed, extubated without difficulty, and transported to recovery room in satisfactory condition.  The patient tolerated the procedure well without complications. DD:  06/13/00 TD:  06/14/00 Job: [id number]

## 2011-05-08 ENCOUNTER — Other Ambulatory Visit: Payer: Self-pay | Admitting: Gastroenterology

## 2011-08-29 ENCOUNTER — Other Ambulatory Visit: Payer: Self-pay | Admitting: Obstetrics & Gynecology

## 2011-11-30 DIAGNOSIS — G894 Chronic pain syndrome: Secondary | ICD-10-CM | POA: Diagnosis not present

## 2011-11-30 DIAGNOSIS — M961 Postlaminectomy syndrome, not elsewhere classified: Secondary | ICD-10-CM | POA: Diagnosis not present

## 2011-12-08 DIAGNOSIS — R040 Epistaxis: Secondary | ICD-10-CM | POA: Diagnosis not present

## 2011-12-08 DIAGNOSIS — R7309 Other abnormal glucose: Secondary | ICD-10-CM | POA: Diagnosis not present

## 2011-12-21 IMAGING — US US ABDOMEN COMPLETE
1 series · 13 of 25 positions shown · non-contrast
Comparison: None

CLINICAL DATA: Abdominal pain.

ABDOMINAL ULTRASOUND COMPLETE

[Series 1: us abdomen complete · 0.30mm/px · 13 of 67 slices shown]
[im 1/67]
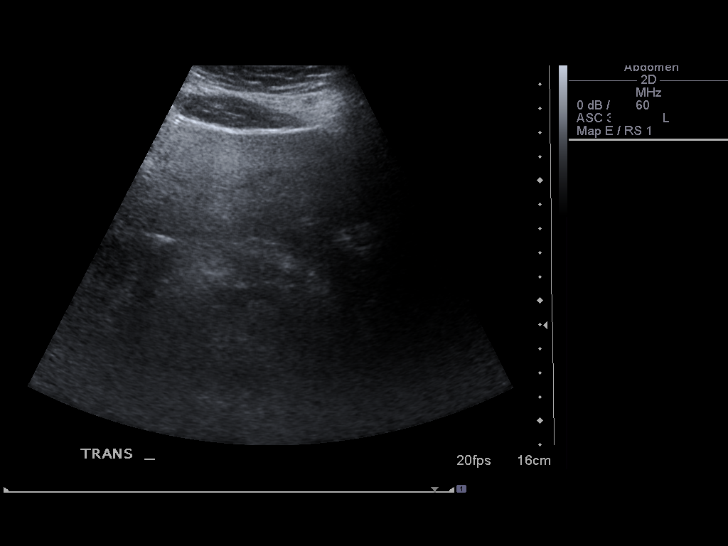
[im 6/67]
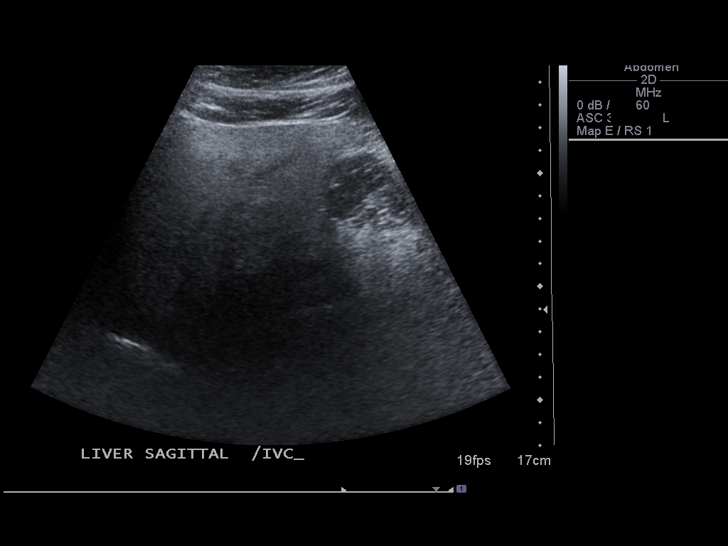
[im 12/67]
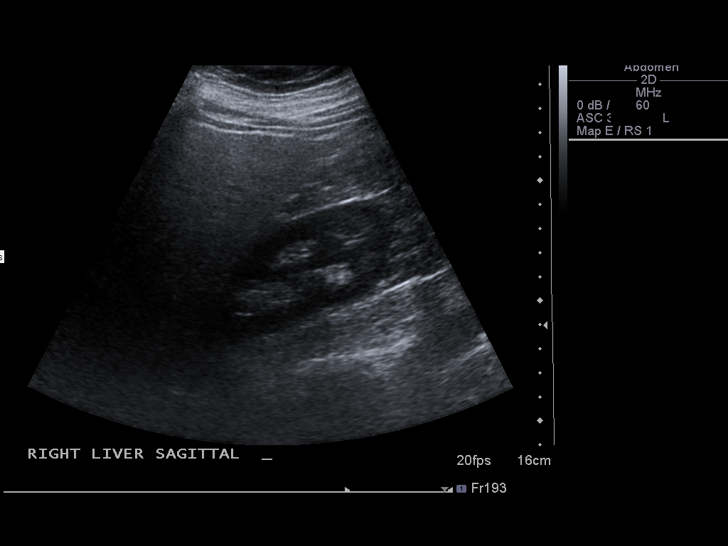
[im 17/67]
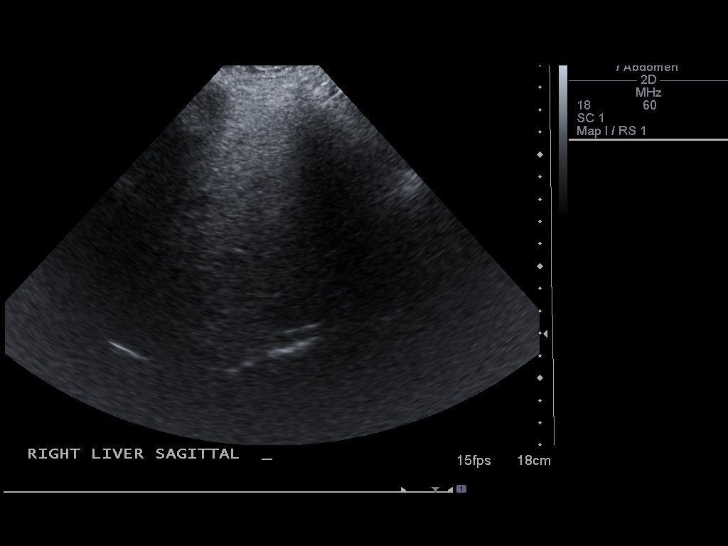
[im 23/67]
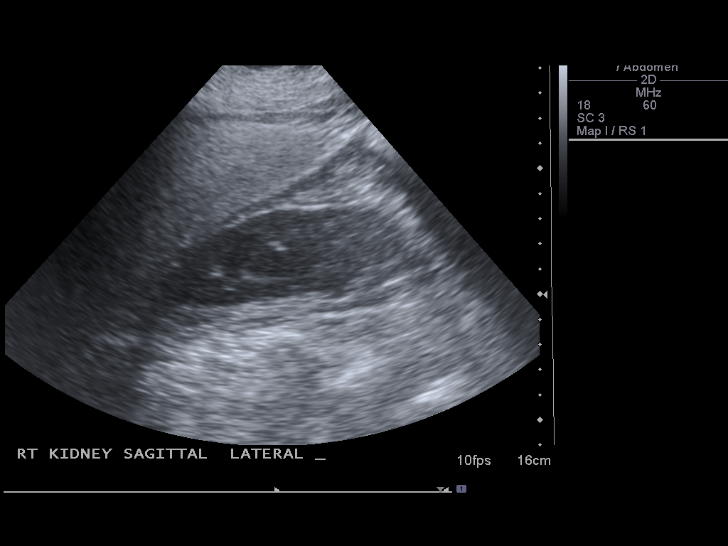
[im 28/67]
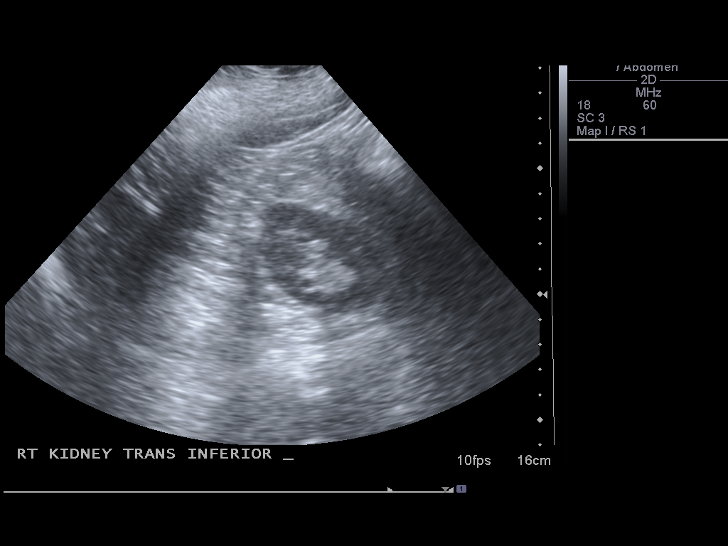
[im 34/67]
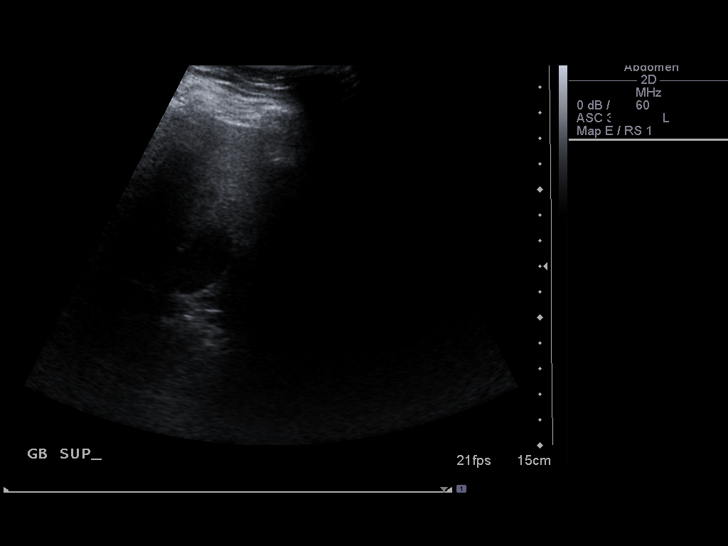
[im 39/67]
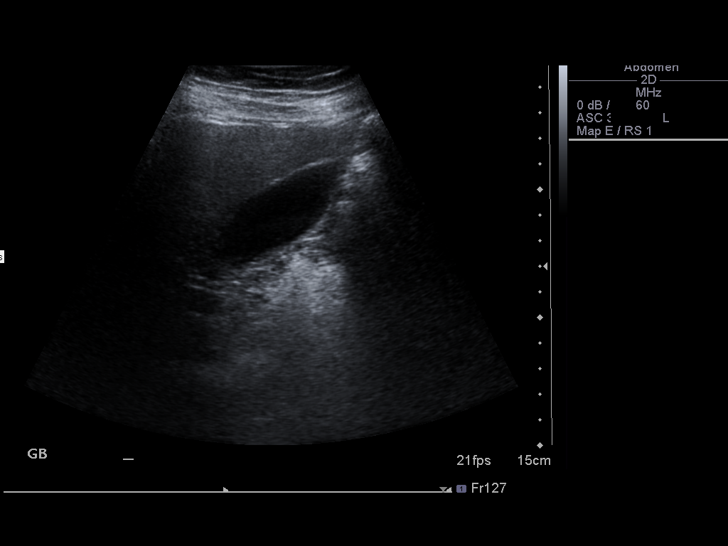
[im 45/67]
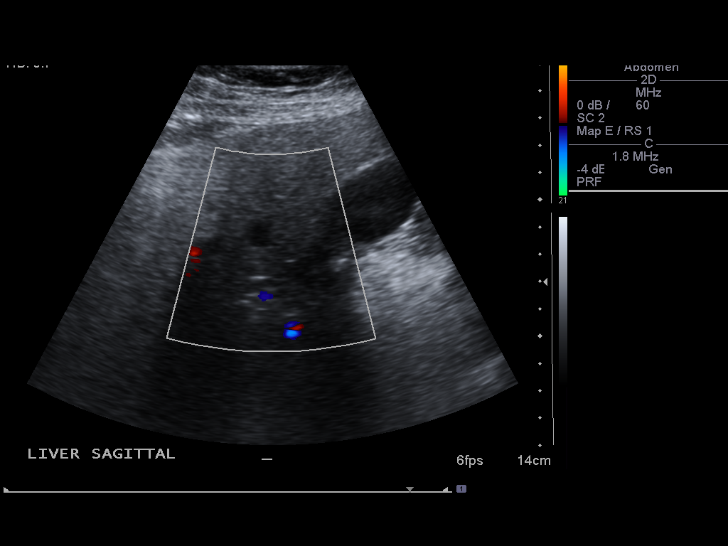
[im 50/67]
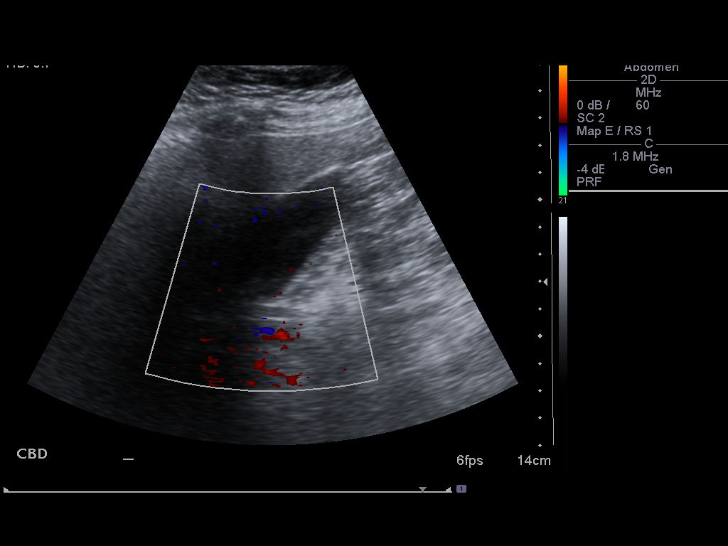
[im 56/67]
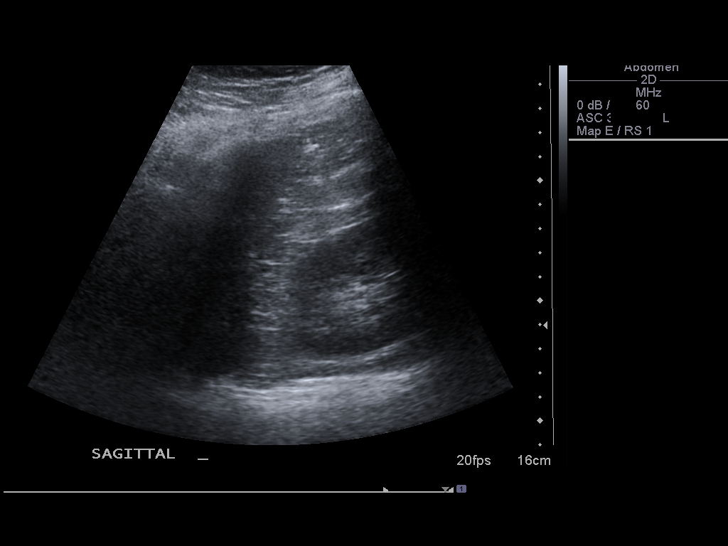
[im 61/67]
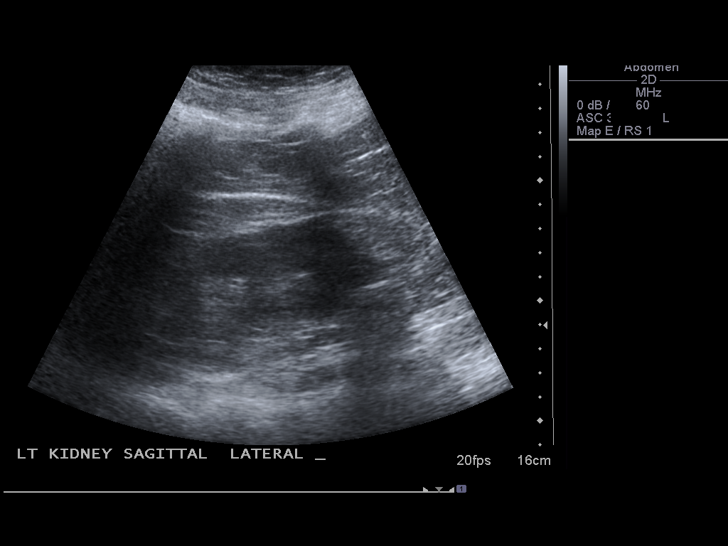
[im 67/67]
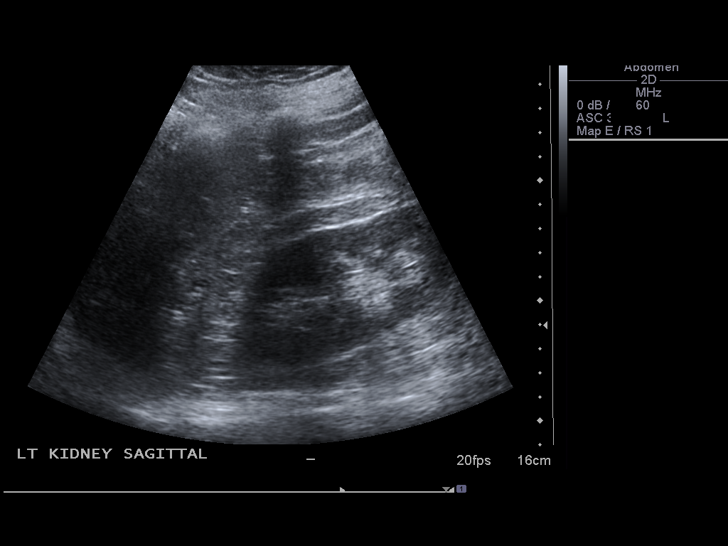

[13 of 25 positions shown; findings below may reference images not displayed]

FINDINGS: Gallbladder: No shadowing gallstones or echogenic sludge. No
gallbladder wall thickening or pericholecystic fluid. The
gallbladder wall thickness measured 1.2 mm. No sonographic Murphy's
sign according to the ultrasound technologist.

CBD: Normal in caliber measuring 4.2 mm. No choledocholithiasis is
evident.

Liver:  Normal size with increased density and increased
echogenicity of the parenchymal echotexture.  There is hypoechoic
area with a few internal echoes consistent with hepatic cyst.  This
area measures 1.3 x 1.1 x 1.0 cm.  Portal vein is patent.  There is
hepatopetal flow by color Doppler examination which is normal.

IVC:  Patent throughout its visualized course in the abdomen.

Pancreas:  Pancreatic tissue was obscured by overlying bowel gas.
It cannot be evaluated.

Spleen:  Normal size and echotexture without focal abnormality.
Splenic length is 7.4 cm.

Right kidney:  No hydronephrosis.  Well-preserved cortex.  Normal
parenchymal echotexture without focal abnormalities.  Right renal
length is 10.9 cm.

Left kidney:  No hydronephrosis.  Well-preserved cortex.  Normal
parenchymal echotexture without focal abnormalities.  Left renal
length is 10.8 cm.

Aorta:  Maximum diameter is 1.2 cm.  No aneurysm is evident.
Proximal portion cannot be visualized.

Ascites:  None.
IMPRESSION: No acute abdominal pathology was demonstrated.  Small hepatic cyst.
Diffuse increased echogenicity of parenchymal pattern of the liver.
Most commonly this is associated with fatty infiltration of the
liver.

## 2011-12-27 DIAGNOSIS — H04129 Dry eye syndrome of unspecified lacrimal gland: Secondary | ICD-10-CM | POA: Diagnosis not present

## 2012-01-01 DIAGNOSIS — K121 Other forms of stomatitis: Secondary | ICD-10-CM | POA: Diagnosis not present

## 2012-01-01 DIAGNOSIS — J019 Acute sinusitis, unspecified: Secondary | ICD-10-CM | POA: Diagnosis not present

## 2012-01-11 DIAGNOSIS — J441 Chronic obstructive pulmonary disease with (acute) exacerbation: Secondary | ICD-10-CM | POA: Diagnosis not present

## 2012-01-11 DIAGNOSIS — J40 Bronchitis, not specified as acute or chronic: Secondary | ICD-10-CM | POA: Diagnosis not present

## 2012-01-30 DIAGNOSIS — G894 Chronic pain syndrome: Secondary | ICD-10-CM | POA: Diagnosis not present

## 2012-01-30 DIAGNOSIS — M961 Postlaminectomy syndrome, not elsewhere classified: Secondary | ICD-10-CM | POA: Diagnosis not present

## 2012-03-11 DIAGNOSIS — R071 Chest pain on breathing: Secondary | ICD-10-CM | POA: Diagnosis not present

## 2012-03-11 DIAGNOSIS — M6250 Muscle wasting and atrophy, not elsewhere classified, unspecified site: Secondary | ICD-10-CM | POA: Diagnosis not present

## 2012-03-21 DIAGNOSIS — M961 Postlaminectomy syndrome, not elsewhere classified: Secondary | ICD-10-CM | POA: Diagnosis not present

## 2012-03-21 DIAGNOSIS — G894 Chronic pain syndrome: Secondary | ICD-10-CM | POA: Diagnosis not present

## 2012-03-21 DIAGNOSIS — M545 Low back pain, unspecified: Secondary | ICD-10-CM | POA: Diagnosis not present

## 2012-04-05 DIAGNOSIS — K120 Recurrent oral aphthae: Secondary | ICD-10-CM | POA: Diagnosis not present

## 2012-04-18 DIAGNOSIS — G894 Chronic pain syndrome: Secondary | ICD-10-CM | POA: Diagnosis not present

## 2012-04-18 DIAGNOSIS — M961 Postlaminectomy syndrome, not elsewhere classified: Secondary | ICD-10-CM | POA: Diagnosis not present

## 2012-05-25 ENCOUNTER — Other Ambulatory Visit: Payer: Self-pay | Admitting: Gastroenterology

## 2012-05-29 ENCOUNTER — Other Ambulatory Visit: Payer: Self-pay | Admitting: Gastroenterology

## 2012-05-29 MED ORDER — ESOMEPRAZOLE MAGNESIUM 40 MG PO CPDR: 40.0000 mg | DELAYED_RELEASE_CAPSULE | Freq: Two times a day (BID) | ORAL | Status: DC

## 2012-05-30 DIAGNOSIS — M533 Sacrococcygeal disorders, not elsewhere classified: Secondary | ICD-10-CM | POA: Diagnosis not present

## 2012-05-30 DIAGNOSIS — G894 Chronic pain syndrome: Secondary | ICD-10-CM | POA: Diagnosis not present

## 2012-05-30 DIAGNOSIS — M961 Postlaminectomy syndrome, not elsewhere classified: Secondary | ICD-10-CM | POA: Diagnosis not present

## 2012-05-30 DIAGNOSIS — Z79899 Other long term (current) drug therapy: Secondary | ICD-10-CM | POA: Diagnosis not present

## 2012-05-30 DIAGNOSIS — Z5181 Encounter for therapeutic drug level monitoring: Secondary | ICD-10-CM | POA: Diagnosis not present

## 2012-05-31 ENCOUNTER — Other Ambulatory Visit: Payer: Self-pay | Admitting: Obstetrics & Gynecology

## 2012-05-31 DIAGNOSIS — R928 Other abnormal and inconclusive findings on diagnostic imaging of breast: Secondary | ICD-10-CM

## 2012-06-05 ENCOUNTER — Ambulatory Visit
Admission: RE | Admit: 2012-06-05 | Discharge: 2012-06-05 | Disposition: A | Discharge status: Home / Self Care | Payer: Medicare Other | Source: Ambulatory Visit | Attending: Obstetrics & Gynecology | Admitting: Obstetrics & Gynecology

## 2012-06-05 ENCOUNTER — Ambulatory Visit
Admission: RE | Admit: 2012-06-05 | Discharge: 2012-06-05 | Disposition: A | Discharge status: Home / Self Care | Payer: PRIVATE HEALTH INSURANCE | Source: Ambulatory Visit | Attending: Obstetrics & Gynecology | Admitting: Obstetrics & Gynecology

## 2012-06-05 DIAGNOSIS — R928 Other abnormal and inconclusive findings on diagnostic imaging of breast: Secondary | ICD-10-CM

## 2012-06-11 ENCOUNTER — Ambulatory Visit: Payer: PRIVATE HEALTH INSURANCE | Admitting: Gastroenterology

## 2012-06-25 ENCOUNTER — Other Ambulatory Visit: Payer: Self-pay | Admitting: Gastroenterology

## 2012-07-16 ENCOUNTER — Ambulatory Visit: Payer: PRIVATE HEALTH INSURANCE | Admitting: Gastroenterology

## 2012-07-16 DIAGNOSIS — M533 Sacrococcygeal disorders, not elsewhere classified: Secondary | ICD-10-CM | POA: Diagnosis not present

## 2012-07-25 ENCOUNTER — Encounter: Payer: Self-pay | Admitting: *Deleted

## 2012-07-30 ENCOUNTER — Ambulatory Visit: Payer: PRIVATE HEALTH INSURANCE | Admitting: Gastroenterology

## 2012-07-30 ENCOUNTER — Telehealth: Payer: Self-pay | Admitting: Gastroenterology

## 2012-07-30 NOTE — Telephone Encounter (Signed)
Per Dr. Jarold Motto do not charge patient for same day cancellation.

## 2012-08-06 ENCOUNTER — Ambulatory Visit: Payer: PRIVATE HEALTH INSURANCE | Admitting: Gastroenterology

## 2012-08-08 ENCOUNTER — Encounter: Payer: Self-pay | Admitting: *Deleted

## 2012-08-13 ENCOUNTER — Encounter: Payer: Self-pay | Admitting: Gastroenterology

## 2012-08-13 ENCOUNTER — Ambulatory Visit (INDEPENDENT_AMBULATORY_CARE_PROVIDER_SITE_OTHER): Payer: PRIVATE HEALTH INSURANCE | Admitting: Gastroenterology

## 2012-08-13 VITALS — BP 108/66 | HR 88 | Ht 58.5 in | Wt 142.0 lb

## 2012-08-13 DIAGNOSIS — K219 Gastro-esophageal reflux disease without esophagitis: Secondary | ICD-10-CM

## 2012-08-13 MED ORDER — ESOMEPRAZOLE MAGNESIUM 40 MG PO CPDR: 40.0000 mg | DELAYED_RELEASE_CAPSULE | Freq: Two times a day (BID) | ORAL | Status: DC

## 2012-08-13 MED ORDER — TRAMADOL HCL 50 MG PO TABS: 50.0000 mg | ORAL_TABLET | Freq: Four times a day (QID) | ORAL | Status: DC | PRN

## 2012-08-13 NOTE — Patient Instructions (Addendum)
We have sent the following medications to your pharmacy for you to pick up at your convenience: Nexium. Please take one capsule by mouth twice daily. AND Tramadol. Please take as directed

## 2012-08-13 NOTE — Progress Notes (Signed)
This is a 56 year old Caucasian female with chronic recurrent chest pain. She currently is on Nexium 40 mg twice a day and is asymptomatic. She does smoke, but is down to 5 cigarettes a day. Previous gallbladder ultrasound exam is unremarkable. She is periodic hemorrhoidal type bleeding but denies other lower gastrointestinal problems. Labs are reviewed and are otherwise unremarkable.  Current Medications, Allergies, Past Medical History, Past Surgical History, Family History and Social History were reviewed in [employer] record.  Pertinent Review of Systems Negative   Physical Exam: Blood pressure 108/66, pulse 88 and regular, and weight 142 pounds. Chest is entirely clear without wheezes or rhonchi. She is a regular rhythm without murmurs gallops or rubs. I cannot appreciate hepatomegaly, abdominal masses or tenderness. Bowel sounds are normal. Mental status is normal.    Assessment and Plan: Chronic GERD under good management with twice a day PPI therapy. We'll continue this regime along with standard antireflux maneuvers. She will see [place] yearly or when necessary basis as needed. I again have encouraged her to discontinue her cigarette habit. She is up-to-date her colonoscopy exams. No diagnosis found.

## 2012-09-03 ENCOUNTER — Encounter: Payer: Self-pay | Admitting: Gastroenterology

## 2012-10-18 DIAGNOSIS — G894 Chronic pain syndrome: Secondary | ICD-10-CM | POA: Diagnosis not present

## 2012-10-18 DIAGNOSIS — M961 Postlaminectomy syndrome, not elsewhere classified: Secondary | ICD-10-CM | POA: Diagnosis not present

## 2012-10-18 DIAGNOSIS — M533 Sacrococcygeal disorders, not elsewhere classified: Secondary | ICD-10-CM | POA: Diagnosis not present

## 2012-11-01 DIAGNOSIS — M546 Pain in thoracic spine: Secondary | ICD-10-CM | POA: Diagnosis not present

## 2012-11-01 DIAGNOSIS — G894 Chronic pain syndrome: Secondary | ICD-10-CM | POA: Diagnosis not present

## 2012-11-01 DIAGNOSIS — M961 Postlaminectomy syndrome, not elsewhere classified: Secondary | ICD-10-CM | POA: Diagnosis not present

## 2012-11-01 DIAGNOSIS — M533 Sacrococcygeal disorders, not elsewhere classified: Secondary | ICD-10-CM | POA: Diagnosis not present

## 2012-11-19 DIAGNOSIS — G894 Chronic pain syndrome: Secondary | ICD-10-CM | POA: Diagnosis not present

## 2012-11-19 DIAGNOSIS — M961 Postlaminectomy syndrome, not elsewhere classified: Secondary | ICD-10-CM | POA: Diagnosis not present

## 2012-11-20 DIAGNOSIS — M542 Cervicalgia: Secondary | ICD-10-CM | POA: Diagnosis not present

## 2012-11-20 DIAGNOSIS — IMO0001 Reserved for inherently not codable concepts without codable children: Secondary | ICD-10-CM | POA: Diagnosis not present

## 2012-12-18 DIAGNOSIS — M961 Postlaminectomy syndrome, not elsewhere classified: Secondary | ICD-10-CM | POA: Diagnosis not present

## 2012-12-18 DIAGNOSIS — M546 Pain in thoracic spine: Secondary | ICD-10-CM | POA: Diagnosis not present

## 2012-12-18 DIAGNOSIS — G894 Chronic pain syndrome: Secondary | ICD-10-CM | POA: Diagnosis not present

## 2012-12-23 ENCOUNTER — Other Ambulatory Visit: Payer: Self-pay

## 2012-12-23 MED ORDER — ATORVASTATIN CALCIUM 10 MG PO TABS: 10.0000 mg | ORAL_TABLET | Freq: Every day | ORAL | Status: DC

## 2013-01-21 ENCOUNTER — Telehealth: Payer: Self-pay | Admitting: Nurse Practitioner

## 2013-01-21 NOTE — Telephone Encounter (Signed)
APPT MADE

## 2013-01-23 ENCOUNTER — Ambulatory Visit (INDEPENDENT_AMBULATORY_CARE_PROVIDER_SITE_OTHER): Payer: PRIVATE HEALTH INSURANCE | Admitting: Nurse Practitioner

## 2013-01-23 ENCOUNTER — Encounter: Payer: Self-pay | Admitting: Nurse Practitioner

## 2013-01-23 VITALS — BP 118/69 | HR 69 | Temp 99.0°F | Ht 59.0 in | Wt 138.0 lb

## 2013-01-23 DIAGNOSIS — M542 Cervicalgia: Secondary | ICD-10-CM

## 2013-01-23 MED ORDER — CYCLOBENZAPRINE HCL 5 MG PO TABS: 5.0000 mg | ORAL_TABLET | Freq: Three times a day (TID) | ORAL | Status: DC | PRN

## 2013-01-23 MED ORDER — PREDNISONE (PAK) 10 MG PO TABS: 10.0000 mg | ORAL_TABLET | Freq: Every day | ORAL | Status: DC

## 2013-01-23 NOTE — Patient Instructions (Signed)

## 2013-01-23 NOTE — Progress Notes (Signed)
  Subjective:    Patient ID: Lori Schroeder, female    DOB: Oct 19, 1955, 57 y.o.   MRN: [id number]  HPI- Patient in C/O neck pain. Started in February. Saw A.Caryn Bee- xray of cervical spine was negative. Still having pain and stiffness. Sometimes will get a knot on right lower neck. Rates pain 5/10. Heat helps pain but nothing helps stiffness. Patient goes to [facility] and patient is on Dilaudid.    Review of Systems  Musculoskeletal:       Neck pain- doesn't radiate down arms   Neurological: Negative for tremors.  All other systems reviewed and are negative.       Objective:   Physical Exam  Constitutional: She appears well-developed and well-nourished.  Cardiovascular: Normal rate.   Pulmonary/Chest: Effort normal.  Musculoskeletal:  Pain on palpation along right sternocleidomastoid muscle Decrease ROM of cervical spine due to pain on rotation in either direction and on extension Grips = bil Motor strength and sensation bil upper ext intact  Neurological: She has normal reflexes.  BP 118/69  Pulse 69  Temp(Src) 99 F (37.2 C) (Oral)  Ht 4\' 11"  (1.499 m)  Wt 138 lb (62.596 kg)  BMI 27.86 kg/m2       Assessment & Plan:  1. Neck pain Moist Heat Rest No heavy lifting If no better once finish prednisone will do MRI of neck - predniSONE (STERAPRED UNI-PAK) 10 MG tablet; Take 1 tablet (10 mg total) by mouth daily. As directed  Dispense: 21 tablet; Refill: 0 - cyclobenzaprine (FLEXERIL) 5 MG tablet; Take 1 tablet (5 mg total) by mouth 3 (three) times daily as needed for muscle spasms.  Dispense: 30 tablet; Refill: 1  Mary-Margaret Daphine Deutscher, FNP

## 2013-02-04 DIAGNOSIS — M47817 Spondylosis without myelopathy or radiculopathy, lumbosacral region: Secondary | ICD-10-CM | POA: Diagnosis not present

## 2013-02-04 DIAGNOSIS — M5380 Other specified dorsopathies, site unspecified: Secondary | ICD-10-CM | POA: Diagnosis not present

## 2013-02-04 DIAGNOSIS — M546 Pain in thoracic spine: Secondary | ICD-10-CM | POA: Diagnosis not present

## 2013-03-18 DIAGNOSIS — M25519 Pain in unspecified shoulder: Secondary | ICD-10-CM | POA: Diagnosis not present

## 2013-03-18 DIAGNOSIS — G894 Chronic pain syndrome: Secondary | ICD-10-CM | POA: Diagnosis not present

## 2013-03-18 DIAGNOSIS — M533 Sacrococcygeal disorders, not elsewhere classified: Secondary | ICD-10-CM | POA: Diagnosis not present

## 2013-03-18 DIAGNOSIS — Z5181 Encounter for therapeutic drug level monitoring: Secondary | ICD-10-CM | POA: Diagnosis not present

## 2013-03-18 DIAGNOSIS — Z79899 Other long term (current) drug therapy: Secondary | ICD-10-CM | POA: Diagnosis not present

## 2013-03-18 DIAGNOSIS — M961 Postlaminectomy syndrome, not elsewhere classified: Secondary | ICD-10-CM | POA: Diagnosis not present

## 2013-03-18 DIAGNOSIS — M546 Pain in thoracic spine: Secondary | ICD-10-CM | POA: Diagnosis not present

## 2013-03-25 DIAGNOSIS — M25511 Pain in right shoulder: Secondary | ICD-10-CM | POA: Insufficient documentation

## 2013-04-03 DIAGNOSIS — S139XXA Sprain of joints and ligaments of unspecified parts of neck, initial encounter: Secondary | ICD-10-CM | POA: Diagnosis not present

## 2013-04-03 DIAGNOSIS — M47812 Spondylosis without myelopathy or radiculopathy, cervical region: Secondary | ICD-10-CM | POA: Diagnosis not present

## 2013-04-03 DIAGNOSIS — M25519 Pain in unspecified shoulder: Secondary | ICD-10-CM | POA: Diagnosis not present

## 2013-04-15 DIAGNOSIS — M5412 Radiculopathy, cervical region: Secondary | ICD-10-CM | POA: Diagnosis not present

## 2013-04-15 DIAGNOSIS — M542 Cervicalgia: Secondary | ICD-10-CM | POA: Diagnosis not present

## 2013-04-23 DIAGNOSIS — M5030 Other cervical disc degeneration, unspecified cervical region: Secondary | ICD-10-CM | POA: Diagnosis not present

## 2013-04-23 DIAGNOSIS — M4802 Spinal stenosis, cervical region: Secondary | ICD-10-CM | POA: Diagnosis not present

## 2013-05-13 DIAGNOSIS — M533 Sacrococcygeal disorders, not elsewhere classified: Secondary | ICD-10-CM | POA: Diagnosis not present

## 2013-05-13 DIAGNOSIS — M25519 Pain in unspecified shoulder: Secondary | ICD-10-CM | POA: Diagnosis not present

## 2013-05-13 DIAGNOSIS — M961 Postlaminectomy syndrome, not elsewhere classified: Secondary | ICD-10-CM | POA: Diagnosis not present

## 2013-05-13 DIAGNOSIS — M546 Pain in thoracic spine: Secondary | ICD-10-CM | POA: Diagnosis not present

## 2013-05-13 DIAGNOSIS — G894 Chronic pain syndrome: Secondary | ICD-10-CM | POA: Diagnosis not present

## 2013-05-29 ENCOUNTER — Encounter: Payer: Self-pay | Admitting: Gastroenterology

## 2013-06-04 DIAGNOSIS — M961 Postlaminectomy syndrome, not elsewhere classified: Secondary | ICD-10-CM | POA: Diagnosis not present

## 2013-06-04 DIAGNOSIS — M461 Sacroiliitis, not elsewhere classified: Secondary | ICD-10-CM | POA: Diagnosis not present

## 2013-06-04 DIAGNOSIS — M545 Low back pain, unspecified: Secondary | ICD-10-CM | POA: Diagnosis not present

## 2013-06-04 DIAGNOSIS — M546 Pain in thoracic spine: Secondary | ICD-10-CM | POA: Diagnosis not present

## 2013-06-04 DIAGNOSIS — IMO0002 Reserved for concepts with insufficient information to code with codable children: Secondary | ICD-10-CM | POA: Diagnosis not present

## 2013-06-04 DIAGNOSIS — G894 Chronic pain syndrome: Secondary | ICD-10-CM | POA: Diagnosis not present

## 2013-06-12 DIAGNOSIS — M4802 Spinal stenosis, cervical region: Secondary | ICD-10-CM | POA: Diagnosis not present

## 2013-06-24 ENCOUNTER — Telehealth: Payer: Self-pay | Admitting: Nurse Practitioner

## 2013-06-24 NOTE — Telephone Encounter (Signed)
APPT MADE

## 2013-06-25 ENCOUNTER — Ambulatory Visit (INDEPENDENT_AMBULATORY_CARE_PROVIDER_SITE_OTHER): Payer: PRIVATE HEALTH INSURANCE | Admitting: Nurse Practitioner

## 2013-06-25 ENCOUNTER — Encounter: Payer: Self-pay | Admitting: Nurse Practitioner

## 2013-06-25 VITALS — BP 113/69 | HR 80 | Temp 99.2°F | Ht 59.0 in | Wt 141.0 lb

## 2013-06-25 DIAGNOSIS — J069 Acute upper respiratory infection, unspecified: Secondary | ICD-10-CM

## 2013-06-25 MED ORDER — AZITHROMYCIN 250 MG PO TABS: ORAL_TABLET | ORAL | Status: DC

## 2013-06-25 NOTE — Progress Notes (Signed)
  Subjective:    Patient ID: Lori Schroeder, female    DOB: [birth year], 57 y.o.   MRN: [id number]  HPI [place] in c/o congestion and cough that started Monday afternoon- bad taste in her mouth- Can't sleep at night due to cough- patient hasn't tried any OTC meds.    Review of Systems  Constitutional: Positive for fever (low grade). Negative for chills.  HENT: Positive for ear pain, congestion, sore throat, rhinorrhea, postnasal drip and sinus pressure. Negative for sneezing, trouble swallowing and voice change.   Respiratory: Positive for cough (productive).   Cardiovascular: Negative.        Objective:   Physical Exam  Constitutional: She is oriented to person, place, and time. She appears well-developed and well-nourished.  HENT:  Right Ear: Hearing, tympanic membrane, external ear and ear canal normal.  Left Ear: Hearing, tympanic membrane, external ear and ear canal normal.  Nose: Mucosal edema and rhinorrhea present. Right sinus exhibits no maxillary sinus tenderness and no frontal sinus tenderness. Left sinus exhibits no maxillary sinus tenderness and no frontal sinus tenderness.  Mouth/Throat: Uvula is midline and mucous membranes are normal. Posterior oropharyngeal erythema present.  Cardiovascular: Normal rate, regular rhythm and normal heart sounds.   Pulmonary/Chest: Effort normal and breath sounds normal. No respiratory distress. She has no wheezes. She has no rales. She exhibits no tenderness.  Deep dry cough  Neurological: She is alert and oriented to person, place, and time.  Skin: Skin is warm and dry.  Psychiatric: She has a normal mood and affect. Her behavior is normal. Judgment and thought content normal.     BP 113/69  Pulse 80  Temp(Src) 99.2 F (37.3 C) (Oral)  Ht 4\' 11"  (1.499 m)  Wt 141 lb (63.957 kg)  BMI 28.46 kg/m2      Assessment & Plan:   1. Upper respiratory infection   1. Take meds as prescribed 2. Use a cool mist humidifier especially during  the winter months and when heat has  been humid. 3. Use saline nose sprays frequently 4. Saline irrigations of the nose can be very helpful if done frequently.  * 4X daily for 1 week*  * Use of a nettie pot can be helpful with this. Follow directions with this* 5. Drink plenty of fluids 6. Keep thermostat turn down low 7.For any cough or congestion  Use plain Mucinex- regular strength or max strength is fine   * Children- consult with Pharmacist for dosing 8. For fever or aces or pains- take tylenol or ibuprofen appropriate for age and weight.  * for fevers greater than 101 orally you may alternate ibuprofen and tylenol every  3 hours.   Meds ordered this encounter  Medications  . azithromycin (ZITHROMAX) 250 MG tablet    Sig: As directed    Dispense:  6 tablet    Refill:  0    Order Specific Question:  Supervising Provider    Answer:  Ernestina Penna [[id number]]   Mary-Margaret Daphine Deutscher, FNP

## 2013-06-25 NOTE — Patient Instructions (Signed)

## 2013-06-26 ENCOUNTER — Other Ambulatory Visit: Payer: Self-pay | Admitting: Nurse Practitioner

## 2013-07-02 ENCOUNTER — Ambulatory Visit: Payer: Self-pay | Admitting: Nurse Practitioner

## 2013-07-07 ENCOUNTER — Ambulatory Visit (INDEPENDENT_AMBULATORY_CARE_PROVIDER_SITE_OTHER): Payer: PRIVATE HEALTH INSURANCE | Admitting: Nurse Practitioner

## 2013-07-07 ENCOUNTER — Encounter: Payer: Self-pay | Admitting: Nurse Practitioner

## 2013-07-07 ENCOUNTER — Other Ambulatory Visit: Payer: 59

## 2013-07-07 VITALS — BP 138/73 | HR 67 | Temp 97.7°F | Ht 59.0 in | Wt 143.0 lb

## 2013-07-07 DIAGNOSIS — R059 Cough, unspecified: Secondary | ICD-10-CM

## 2013-07-07 DIAGNOSIS — I10 Essential (primary) hypertension: Secondary | ICD-10-CM | POA: Diagnosis not present

## 2013-07-07 DIAGNOSIS — E785 Hyperlipidemia, unspecified: Secondary | ICD-10-CM

## 2013-07-07 DIAGNOSIS — R05 Cough: Secondary | ICD-10-CM

## 2013-07-07 DIAGNOSIS — L659 Nonscarring hair loss, unspecified: Secondary | ICD-10-CM | POA: Diagnosis not present

## 2013-07-07 DIAGNOSIS — R7989 Other specified abnormal findings of blood chemistry: Secondary | ICD-10-CM

## 2013-07-07 DIAGNOSIS — R7309 Other abnormal glucose: Secondary | ICD-10-CM

## 2013-07-07 DIAGNOSIS — J069 Acute upper respiratory infection, unspecified: Secondary | ICD-10-CM

## 2013-07-07 MED ORDER — BENZONATATE 100 MG PO CAPS: 100.0000 mg | ORAL_CAPSULE | Freq: Two times a day (BID) | ORAL | Status: DC | PRN

## 2013-07-07 MED ORDER — METHYLPREDNISOLONE ACETATE 80 MG/ML IJ SUSP
80.0000 mg | Freq: Once | INTRAMUSCULAR | Status: AC
  Administered 2013-07-07: 80 mg via INTRAMUSCULAR

## 2013-07-07 NOTE — Progress Notes (Signed)
Pt came in for labs only 

## 2013-07-07 NOTE — Progress Notes (Signed)
  Subjective:    Patient ID: Lori Schroeder, female    DOB: Apr 16, 1956, 57 y.o.   MRN: [id number]  HPI Patient on c/o cough and congestion- She was seen last week and was given a zpak- she feels no better- said that her cough is inchanged and sh ehas been wheezing a lot- PND not much better. No fever  * patient also c/o hair loss- she has been put to sleep for surgery 3 X since MAy  Review of Systems  Constitutional: Positive for appetite change and fatigue. Negative for fever and chills.  HENT: Positive for congestion and postnasal drip. Negative for ear pain.   Respiratory: Positive for cough and wheezing.   Cardiovascular: Negative.        Objective:   Physical Exam  Constitutional: She is oriented to person, place, and time. She appears well-developed and well-nourished.  HENT:  Right Ear: Hearing, tympanic membrane, external ear and ear canal normal.  Left Ear: Hearing, tympanic membrane, external ear and ear canal normal.  Nose: Mucosal edema and rhinorrhea present. Right sinus exhibits no maxillary sinus tenderness and no frontal sinus tenderness. Left sinus exhibits no maxillary sinus tenderness and no frontal sinus tenderness.  Mouth/Throat: Oropharynx is clear and moist and mucous membranes are normal.  Cardiovascular: Normal rate, regular rhythm and normal heart sounds.   Pulmonary/Chest: Effort normal and breath sounds normal. No respiratory distress. She has no wheezes. She has no rales.  Dry cough   Abdominal: Soft. Bowel sounds are normal.  Neurological: She is alert and oriented to person, place, and time.  Skin: Skin is warm and dry.      BP 138/73  Pulse 67  Temp(Src) 97.7 F (36.5 C) (Oral)  Ht 4\' 11"  (1.499 m)  Wt 143 lb (64.864 kg)  BMI 28.87 kg/m2     Assessment & Plan:   1. Hair loss   2. Cough   3. Viral URI with cough    Meds ordered this encounter  Medications  . methylPREDNISolone acetate (DEPO-MEDROL) injection 80 mg    Sig:   . benzonatate  (TESSALON) 100 MG capsule    Sig: Take 1 capsule (100 mg total) by mouth 2 (two) times daily as needed for cough.    Dispense:  20 capsule    Refill:  0    Order Specific Question:  Supervising Provider    Answer:  Ernestina Penna [[id number]]   1. Take meds as prescribed 2. Use a cool mist humidifier especially during the winter months and when heat has  been humid. 3. Use saline nose sprays frequently 4. Saline irrigations of the nose can be very helpful if done frequently.  * 4X daily for 1 week*  * Use of a nettie pot can be helpful with this. Follow directions with this* 5. Drink plenty of fluids 6. Keep thermostat turn down low 7.For any cough or congestion  Use plain Mucinex- regular strength or max strength is fine   * Children- consult with Pharmacist for dosing 8. For fever or aces or pains- take tylenol or ibuprofen appropriate for age and weight.  * for fevers greater than 101 orally you may alternate ibuprofen and tylenol every  3 hours.   Mary-Margaret Daphine Deutscher, FNP

## 2013-07-08 DIAGNOSIS — M5412 Radiculopathy, cervical region: Secondary | ICD-10-CM | POA: Diagnosis not present

## 2013-07-08 DIAGNOSIS — M546 Pain in thoracic spine: Secondary | ICD-10-CM | POA: Diagnosis not present

## 2013-07-08 DIAGNOSIS — G894 Chronic pain syndrome: Secondary | ICD-10-CM | POA: Diagnosis not present

## 2013-07-08 DIAGNOSIS — M25519 Pain in unspecified shoulder: Secondary | ICD-10-CM | POA: Diagnosis not present

## 2013-07-08 DIAGNOSIS — M533 Sacrococcygeal disorders, not elsewhere classified: Secondary | ICD-10-CM | POA: Diagnosis not present

## 2013-07-08 DIAGNOSIS — M542 Cervicalgia: Secondary | ICD-10-CM | POA: Diagnosis not present

## 2013-07-08 DIAGNOSIS — M961 Postlaminectomy syndrome, not elsewhere classified: Secondary | ICD-10-CM | POA: Diagnosis not present

## 2013-07-08 LAB — CMP14+EGFR
AST: 15 IU/L (ref 0–40)
Albumin/Globulin Ratio: 1.8 (ref 1.1–2.5)
Alkaline Phosphatase: 138 IU/L — ABNORMAL HIGH (ref 39–117)
BUN/Creatinine Ratio: 12 (ref 9–23)
Calcium: 9.7 mg/dL (ref 8.7–10.2)
Creatinine, Ser: 0.67 mg/dL (ref 0.57–1.00)
GFR calc Af Amer: 114 mL/min/{1.73_m2} (ref >59)
GFR calc non Af Amer: 99 mL/min/{1.73_m2} (ref >59)
Globulin, Total: 2.3 g/dL (ref 1.5–4.5)
Sodium: 140 mmol/L (ref 134–144)

## 2013-07-08 LAB — NMR, LIPOPROFILE
HDL Cholesterol by NMR: 52 mg/dL (ref >=40)
HDL Particle Number: 34.7 umol/L (ref >=30.5)
Small LDL Particle Number: 771 nmol/L — ABNORMAL HIGH (ref <=527)

## 2013-07-08 LAB — SPECIMEN STATUS REPORT

## 2013-07-09 LAB — THYROID PANEL WITH TSH: Free Thyroxine Index: 2.2 (ref 1.2–4.9)

## 2013-07-17 ENCOUNTER — Telehealth: Payer: Self-pay | Admitting: Nurse Practitioner

## 2013-07-18 NOTE — Telephone Encounter (Signed)
Patient aware.

## 2013-07-22 ENCOUNTER — Telehealth: Payer: Self-pay | Admitting: Nurse Practitioner

## 2013-07-22 NOTE — Addendum Note (Signed)
Addended by: Monica Becton on: 07/22/2013 06:38 PM   Modules accepted: Orders

## 2013-07-23 ENCOUNTER — Other Ambulatory Visit: Payer: Medicare Other

## 2013-07-24 NOTE — Telephone Encounter (Signed)
Patient aware.

## 2013-07-24 NOTE — Telephone Encounter (Signed)
HgbA1c was 6.9%- which is good- low carb diet and recheck in 3 months

## 2013-07-24 NOTE — Telephone Encounter (Signed)
Please go over A1c that you added for her lab and call her back

## 2013-07-31 DIAGNOSIS — G894 Chronic pain syndrome: Secondary | ICD-10-CM | POA: Diagnosis not present

## 2013-07-31 DIAGNOSIS — M542 Cervicalgia: Secondary | ICD-10-CM | POA: Diagnosis not present

## 2013-07-31 DIAGNOSIS — M5412 Radiculopathy, cervical region: Secondary | ICD-10-CM | POA: Diagnosis not present

## 2013-08-05 DIAGNOSIS — Z1231 Encounter for screening mammogram for malignant neoplasm of breast: Secondary | ICD-10-CM | POA: Diagnosis not present

## 2013-08-05 DIAGNOSIS — Z124 Encounter for screening for malignant neoplasm of cervix: Secondary | ICD-10-CM | POA: Diagnosis not present

## 2013-08-19 ENCOUNTER — Ambulatory Visit: Payer: PRIVATE HEALTH INSURANCE | Admitting: Physical Therapy

## 2013-09-02 ENCOUNTER — Ambulatory Visit: Payer: PRIVATE HEALTH INSURANCE | Admitting: Physical Therapy

## 2013-09-05 ENCOUNTER — Ambulatory Visit (INDEPENDENT_AMBULATORY_CARE_PROVIDER_SITE_OTHER): Payer: PRIVATE HEALTH INSURANCE | Admitting: General Practice

## 2013-09-05 ENCOUNTER — Encounter: Payer: Self-pay | Admitting: General Practice

## 2013-09-05 VITALS — BP 122/61 | HR 92 | Temp 97.6°F | Ht 59.0 in | Wt 138.5 lb

## 2013-09-05 DIAGNOSIS — J209 Acute bronchitis, unspecified: Secondary | ICD-10-CM

## 2013-09-05 DIAGNOSIS — R062 Wheezing: Secondary | ICD-10-CM

## 2013-09-05 DIAGNOSIS — J069 Acute upper respiratory infection, unspecified: Secondary | ICD-10-CM

## 2013-09-05 MED ORDER — PREDNISONE (PAK) 10 MG PO TABS: ORAL_TABLET | ORAL | Status: DC

## 2013-09-05 MED ORDER — AMOXICILLIN 500 MG PO CAPS: 500.0000 mg | ORAL_CAPSULE | Freq: Two times a day (BID) | ORAL | Status: DC

## 2013-09-05 MED ORDER — ALBUTEROL SULFATE HFA 108 (90 BASE) MCG/ACT IN AERS: 2.0000 | INHALATION_SPRAY | Freq: Four times a day (QID) | RESPIRATORY_TRACT | Status: DC | PRN

## 2013-09-05 NOTE — Patient Instructions (Signed)
Bronchitis Bronchitis is the body's way of reacting to injury and/or infection (inflammation) of the bronchi. Bronchi are the air tubes that extend from the windpipe into the lungs. If the inflammation becomes severe, it may cause shortness of breath. CAUSES  Inflammation may be caused by:  A virus.  Germs (bacteria).  Dust.  Allergens.  Pollutants and many other irritants. The cells lining the bronchial tree are covered with tiny hairs (cilia). These constantly beat upward, away from the lungs, toward the mouth. This keeps the lungs free of pollutants. When these cells become too irritated and are unable to do their job, mucus begins to develop. This causes the characteristic cough of bronchitis. The cough clears the lungs when the cilia are unable to do their job. Without either of these protective mechanisms, the mucus would settle in the lungs. Then you would develop pneumonia. Smoking is a common cause of bronchitis and can contribute to pneumonia. Stopping this habit is the single most important thing you can do to help yourself. TREATMENT   Your caregiver may prescribe an antibiotic if the cough is caused by bacteria. Also, medicines that open up your airways make it easier to breathe. Your caregiver may also recommend or prescribe an expectorant. It will loosen the mucus to be coughed up. Only take over-the-counter or prescription medicines for pain, discomfort, or fever as directed by your caregiver.  Removing whatever causes the problem (smoking, for example) is critical to preventing the problem from getting worse.  Cough suppressants may be prescribed for relief of cough symptoms.  Inhaled medicines may be prescribed to help with symptoms now and to help prevent problems from returning.  For those with recurrent (chronic) bronchitis, there may be a need for steroid medicines. SEEK IMMEDIATE MEDICAL CARE IF:   During treatment, you develop more pus-like mucus (purulent  sputum).  You have a fever.  You become progressively more ill.  You have increased difficulty breathing, wheezing, or shortness of breath. It is necessary to seek immediate medical care if you are elderly or sick from any other disease. MAKE SURE YOU:   Understand these instructions.  Will watch your condition.  Will get help right away if you are not doing well or get worse. Document Released: 09/18/2005 Document Revised: 05/21/2013 Document Reviewed: 05/13/2013 Exit[employer] Patient Information 2014 [employer].  Upper Respiratory Infection, Adult An upper respiratory infection (URI) is also sometimes known as the common cold. The upper respiratory tract includes the nose, sinuses, throat, trachea, and bronchi. Bronchi are the airways leading to the lungs. Most people improve within 1 week, but symptoms can last up to 2 weeks. A residual cough may last even longer.  CAUSES Many different viruses can infect the tissues lining the upper respiratory tract. The tissues become irritated and inflamed and often become very moist. Mucus production is also common. A cold is contagious. You can easily spread the virus to others by oral contact. This includes kissing, sharing a glass, coughing, or sneezing. Touching your mouth or nose and then touching a surface, which is then touched by another person, can also spread the virus. SYMPTOMS  Symptoms typically develop 1 to 3 days after you come in contact with a cold virus. Symptoms vary from person to person. They may include:  Runny nose.  Sneezing.  Nasal congestion.  Sinus irritation.  Sore throat.  Loss of voice (laryngitis).  Cough.  Fatigue.  Muscle aches.  Loss of appetite.  Headache.  Low-grade fever. DIAGNOSIS  You   might diagnose your own cold based on familiar symptoms, since most people get a cold 2 to 3 times a year. Your caregiver can confirm this based on your exam. Most importantly, your caregiver can check that  your symptoms are not due to another disease such as strep throat, sinusitis, pneumonia, asthma, or epiglottitis. Blood tests, throat tests, and X-rays are not necessary to diagnose a common cold, but they may sometimes be helpful in excluding other more serious diseases. Your caregiver will decide if any further tests are required. RISKS AND COMPLICATIONS  You may be at risk for a more severe case of the common cold if you smoke cigarettes, have chronic heart disease (such as heart failure) or lung disease (such as asthma), or if you have a weakened immune system. The very young and very old are also at risk for more serious infections. Bacterial sinusitis, middle ear infections, and bacterial pneumonia can complicate the common cold. The common cold can worsen asthma and chronic obstructive pulmonary disease (COPD). Sometimes, these complications can require emergency medical care and may be life-threatening. PREVENTION  The best way to protect against getting a cold is to practice good hygiene. Avoid oral or hand contact with people with cold symptoms. Wash your hands often if contact occurs. There is no clear evidence that vitamin C, vitamin E, echinacea, or exercise reduces the chance of developing a cold. However, it is always recommended to get plenty of rest and practice good nutrition. TREATMENT  Treatment is directed at relieving symptoms. There is no cure. Antibiotics are not effective, because the infection is caused by a virus, not by bacteria. Treatment may include:  Increased fluid intake. Sports drinks offer valuable electrolytes, sugars, and fluids.  Breathing heated mist or steam (vaporizer or shower).  Eating chicken soup or other clear broths, and maintaining good nutrition.  Getting plenty of rest.  Using gargles or lozenges for comfort.  Controlling fevers with ibuprofen or acetaminophen as directed by your caregiver.  Increasing usage of your inhaler if you have  asthma. Zinc gel and zinc lozenges, taken in the first 24 hours of the common cold, can shorten the duration and lessen the severity of symptoms. Pain medicines may help with fever, muscle aches, and throat pain. A variety of non-prescription medicines are available to treat congestion and runny nose. Your caregiver can make recommendations and may suggest nasal or lung inhalers for other symptoms.  HOME CARE INSTRUCTIONS   Only take over-the-counter or prescription medicines for pain, discomfort, or fever as directed by your caregiver.  Use a warm mist humidifier or inhale steam from a shower to increase air moisture. This may keep secretions moist and make it easier to breathe.  Drink enough water and fluids to keep your urine clear or pale yellow.  Rest as needed.  Return to work when your temperature has returned to normal or as your caregiver advises. You may need to stay home longer to avoid infecting others. You can also use a face mask and careful hand washing to prevent spread of the virus. SEEK MEDICAL CARE IF:   After the first few days, you feel you are getting worse rather than better.  You need your caregiver's advice about medicines to control symptoms.  You develop chills, worsening shortness of breath, or brown or red sputum. These may be signs of pneumonia.  You develop yellow or brown nasal discharge or pain in the face, especially when you bend forward. These may be signs of sinusitis.    You develop a fever, swollen neck glands, pain with swallowing, or white areas in the back of your throat. These may be signs of strep throat. SEEK IMMEDIATE MEDICAL CARE IF:   You have a fever.  You develop severe or persistent headache, ear pain, sinus pain, or chest pain.  You develop wheezing, a prolonged cough, cough up blood, or have a change in your usual mucus (if you have chronic lung disease).  You develop sore muscles or a stiff neck. Document Released: 03/14/2001  Document Revised: 12/11/2011 Document Reviewed: 01/20/2011 Exit[employer] Patient Information 2014 [employer].  

## 2013-09-05 NOTE — Progress Notes (Signed)
   Subjective:    Patient ID: Lori Schroeder, female    DOB: [birth year], 57 y.o.   MRN: [id number]  Cough This is a new problem. The current episode started in the past 7 days. The problem has been gradually worsening. The problem occurs hourly. The cough is productive of sputum. Associated symptoms include nasal congestion and wheezing. Pertinent negatives include no chest pain, chills, ear congestion, fever, headaches, postnasal drip, rhinorrhea, sore throat or shortness of breath. Nothing aggravates the symptoms. Treatments tried: mucinex OTC. There is no history of asthma, bronchitis or pneumonia.      Review of Systems  Constitutional: Negative for fever and chills.  HENT: Positive for congestion and sneezing. Negative for postnasal drip, rhinorrhea and sore throat.   Respiratory: Positive for cough and wheezing. Negative for chest tightness and shortness of breath.   Cardiovascular: Negative for chest pain and palpitations.  Gastrointestinal: Negative for abdominal pain.  Genitourinary: Negative for difficulty urinating.  Neurological: Negative for dizziness, weakness and headaches.       Objective:   Physical Exam  Constitutional: She is oriented to person, place, and time. She appears well-developed and well-nourished.  Cardiovascular: Normal rate, regular rhythm and normal heart sounds.   Pulmonary/Chest: Effort normal. No respiratory distress. She has wheezes in the right upper field and the left upper field. She exhibits no tenderness.  Neurological: She is alert and oriented to person, place, and time.  Skin: Skin is warm and dry. No rash noted.  Psychiatric: She has a normal mood and affect.          Assessment & Plan:  1. Upper respiratory infection  - amoxicillin (AMOXIL) 500 MG capsule; Take 1 capsule (500 mg total) by mouth 2 (two) times daily.  Dispense: 20 capsule; Refill: 0  2. Acute bronchitis  - predniSONE (STERAPRED UNI-PAK) 10 MG tablet; Take as directed   Dispense: 21 tablet; Refill: 0  3. Wheezing  - albuterol (PROVENTIL HFA;VENTOLIN HFA) 108 (90 BASE) MCG/ACT inhaler; Inhale 2 puffs into the lungs every 6 (six) hours as needed for wheezing or shortness of breath.  Dispense: 1 Inhaler; Refill: 0 -increase fluids -RTO if symptoms worsen or unresolved Patient verbalized understanding Coralie Keens, FNP-C

## 2013-09-15 ENCOUNTER — Ambulatory Visit: Payer: PRIVATE HEALTH INSURANCE | Admitting: Physical Therapy

## 2013-09-15 ENCOUNTER — Ambulatory Visit: Payer: Medicare Other | Admitting: Nurse Practitioner

## 2013-09-23 ENCOUNTER — Ambulatory Visit: Payer: PRIVATE HEALTH INSURANCE | Admitting: General Practice

## 2013-09-26 ENCOUNTER — Ambulatory Visit (INDEPENDENT_AMBULATORY_CARE_PROVIDER_SITE_OTHER): Payer: Medicare Other | Admitting: Nurse Practitioner

## 2013-09-26 ENCOUNTER — Encounter: Payer: Self-pay | Admitting: Nurse Practitioner

## 2013-09-26 DIAGNOSIS — H6640 Suppurative otitis media, unspecified, unspecified ear: Secondary | ICD-10-CM | POA: Diagnosis not present

## 2013-09-26 MED ORDER — CIPROFLOXACIN HCL 500 MG PO TABS: 500.0000 mg | ORAL_TABLET | Freq: Two times a day (BID) | ORAL | Status: DC

## 2013-09-26 NOTE — Patient Instructions (Signed)
Otitis Media with Effusion °Otitis media with effusion is the presence of fluid in the middle ear. This is a common problem that often follows ear infections. It may be present for weeks or longer after the infection. Unlike an acute ear infection, otits media with effusion refers only to fluid behind the ear drum and not infection. Children with repeated ear and sinus infections and allergy problems are the most likely to get otitis media with effusion. °CAUSES  °The most frequent cause of the fluid buildup is dysfunction of the eustacian tubes. These are the tubes that drain fluid in the ears to the throat. °SYMPTOMS  °· The main symptom of this condition is hearing loss. As a result, you or your child may: °· Listen to the TV at a loud volume. °· Not respond to questions. °· Ask "what" often when spoken to. °· There may be a sensation of fullness or pressure but usually not pain. °DIAGNOSIS  °· Your caregiver will diagnose this condition by examining you or your child's ears. °· Your caregiver may test the pressure in you or your child's ear with a tympanometer. °· A hearing test may be conducted if the problem persists. °· A caregiver will want to re-evaluate the condition periodically to see if it improves. °TREATMENT  °· Treatment depends on the duration and the effects of the effusion. °· Antibiotics, decongestants, nose drops, and cortisone-type drugs may not be helpful. °· Children with persistent ear effusions may have delayed language. Children at risk for developmental delays in hearing, learning, and speech may require referral to a specialist earlier than children not at risk. °· You or your child's caregiver may suggest a referral to an Ear, Nose, and Throat (ENT) surgeon for treatment. The following may help restore normal hearing: °· Drainage of fluid. °· Placement of ear tubes (tympanostomy tubes). °· Removal of adenoids (adenoidectomy). °HOME CARE INSTRUCTIONS  °· Avoid second hand  smoke. °· Infants who are breast fed are less likely to have this condition. °· Avoid feeding infants while laying flat. °· Avoid known environmental allergens. °· Be sure to see a caregiver or an ENT specialist for follow up. °· Avoid people who are sick. °SEEK MEDICAL CARE IF:  °· Hearing is not better in 3 months. °· Hearing is worse. °· Ear pain. °· Drainage from the ear. °· Dizziness. °Document Released: 10/26/2004 Document Revised: 12/11/2011 Document Reviewed: 04/15/2013 °ExitCare® Patient Information ©2014 [employer]. ° °

## 2013-09-26 NOTE — Progress Notes (Signed)
   Subjective:    Patient ID: Lori Schroeder, female    DOB: 1956/08/29, 57 y.o.   MRN: [id number]  HPI Patient in c/o ear pain started last night- motrin some help    Review of Systems  Constitutional: Negative for fever and chills.  HENT: Positive for congestion and ear pain.   Respiratory: Positive for cough.        Objective:   Physical Exam  Constitutional: She appears well-developed and well-nourished.  HENT:  Right Ear: Hearing normal. Tympanic membrane is erythematous.  Left Ear: Hearing, tympanic membrane, external ear and ear canal normal.  Nose: Mucosal edema and rhinorrhea present. Right sinus exhibits no maxillary sinus tenderness and no frontal sinus tenderness. Left sinus exhibits no maxillary sinus tenderness and no frontal sinus tenderness.  Cardiovascular: Normal rate and normal heart sounds.   Pulmonary/Chest: Effort normal and breath sounds normal.  BP 110/62  Pulse 88  Temp(Src) 98.8 F (37.1 C) (Oral)  Ht 4\' 11"  (1.499 m)  Wt 136 lb (61.689 kg)  BMI 27.45 kg/m2         Assessment & Plan:   1. Suppurative OM, right    Meds ordered this encounter  Medications  . ciprofloxacin (CIPRO) 500 MG tablet    Sig: Take 1 tablet (500 mg total) by mouth 2 (two) times daily.    Dispense:  14 tablet    Refill:  0    Order Specific Question:  Supervising Provider    Answer:  Deborra Medina   Motrin OTC for pain Force fluids OTC decongestant  Mary-Margaret Daphine Deutscher, FNP

## 2013-09-29 ENCOUNTER — Ambulatory Visit: Payer: PRIVATE HEALTH INSURANCE | Admitting: Physical Therapy

## 2013-10-22 ENCOUNTER — Ambulatory Visit: Payer: PRIVATE HEALTH INSURANCE | Admitting: Physical Therapy

## 2013-10-24 ENCOUNTER — Other Ambulatory Visit: Payer: Self-pay | Admitting: Gastroenterology

## 2013-10-24 ENCOUNTER — Other Ambulatory Visit: Payer: Self-pay | Admitting: Nurse Practitioner

## 2013-10-27 ENCOUNTER — Ambulatory Visit: Payer: PRIVATE HEALTH INSURANCE | Admitting: Physical Therapy

## 2013-10-29 ENCOUNTER — Ambulatory Visit: Payer: Managed Care, Other (non HMO) | Attending: Physician Assistant | Admitting: Physical Therapy

## 2013-10-29 DIAGNOSIS — R5381 Other malaise: Secondary | ICD-10-CM | POA: Diagnosis not present

## 2013-10-29 DIAGNOSIS — M542 Cervicalgia: Secondary | ICD-10-CM | POA: Diagnosis not present

## 2013-10-29 DIAGNOSIS — R293 Abnormal posture: Secondary | ICD-10-CM | POA: Diagnosis not present

## 2013-10-29 DIAGNOSIS — IMO0001 Reserved for inherently not codable concepts without codable children: Secondary | ICD-10-CM | POA: Diagnosis not present

## 2013-10-29 DIAGNOSIS — M2560 Stiffness of unspecified joint, not elsewhere classified: Secondary | ICD-10-CM | POA: Diagnosis not present

## 2013-10-29 DIAGNOSIS — M810 Age-related osteoporosis without current pathological fracture: Secondary | ICD-10-CM | POA: Diagnosis not present

## 2013-10-29 DIAGNOSIS — I10 Essential (primary) hypertension: Secondary | ICD-10-CM | POA: Diagnosis not present

## 2013-10-30 ENCOUNTER — Ambulatory Visit: Payer: Managed Care, Other (non HMO) | Admitting: Physical Therapy

## 2013-10-30 DIAGNOSIS — M2560 Stiffness of unspecified joint, not elsewhere classified: Secondary | ICD-10-CM | POA: Diagnosis not present

## 2013-10-30 DIAGNOSIS — R293 Abnormal posture: Secondary | ICD-10-CM | POA: Diagnosis not present

## 2013-10-30 DIAGNOSIS — I10 Essential (primary) hypertension: Secondary | ICD-10-CM | POA: Diagnosis not present

## 2013-10-30 DIAGNOSIS — R5381 Other malaise: Secondary | ICD-10-CM | POA: Diagnosis not present

## 2013-10-30 DIAGNOSIS — IMO0001 Reserved for inherently not codable concepts without codable children: Secondary | ICD-10-CM | POA: Diagnosis not present

## 2013-10-30 DIAGNOSIS — M542 Cervicalgia: Secondary | ICD-10-CM | POA: Diagnosis not present

## 2013-11-03 ENCOUNTER — Telehealth: Payer: Self-pay | Admitting: Nurse Practitioner

## 2013-11-03 NOTE — Telephone Encounter (Signed)
Due for f/u and has an appt next door on Thursday. Appt scheduled. Patient aware.

## 2013-11-05 ENCOUNTER — Ambulatory Visit: Payer: Managed Care, Other (non HMO) | Attending: Physician Assistant | Admitting: Physical Therapy

## 2013-11-05 DIAGNOSIS — R5381 Other malaise: Secondary | ICD-10-CM | POA: Insufficient documentation

## 2013-11-05 DIAGNOSIS — M2560 Stiffness of unspecified joint, not elsewhere classified: Secondary | ICD-10-CM | POA: Insufficient documentation

## 2013-11-05 DIAGNOSIS — IMO0001 Reserved for inherently not codable concepts without codable children: Secondary | ICD-10-CM | POA: Insufficient documentation

## 2013-11-05 DIAGNOSIS — M542 Cervicalgia: Secondary | ICD-10-CM | POA: Insufficient documentation

## 2013-11-05 DIAGNOSIS — R293 Abnormal posture: Secondary | ICD-10-CM | POA: Insufficient documentation

## 2013-11-06 ENCOUNTER — Encounter: Payer: Self-pay | Admitting: Nurse Practitioner

## 2013-11-06 ENCOUNTER — Ambulatory Visit (INDEPENDENT_AMBULATORY_CARE_PROVIDER_SITE_OTHER): Payer: 59 | Admitting: Nurse Practitioner

## 2013-11-06 ENCOUNTER — Ambulatory Visit: Payer: Managed Care, Other (non HMO) | Admitting: Physical Therapy

## 2013-11-06 VITALS — BP 107/67 | HR 62 | Temp 96.6°F | Ht 59.0 in | Wt 134.0 lb

## 2013-11-06 DIAGNOSIS — I10 Essential (primary) hypertension: Secondary | ICD-10-CM | POA: Diagnosis not present

## 2013-11-06 DIAGNOSIS — E785 Hyperlipidemia, unspecified: Secondary | ICD-10-CM | POA: Diagnosis not present

## 2013-11-06 DIAGNOSIS — K219 Gastro-esophageal reflux disease without esophagitis: Secondary | ICD-10-CM

## 2013-11-06 MED ORDER — LISINOPRIL-HYDROCHLOROTHIAZIDE 10-12.5 MG PO TABS: 1.0000 | ORAL_TABLET | Freq: Every day | ORAL | Status: DC

## 2013-11-06 MED ORDER — ATORVASTATIN CALCIUM 10 MG PO TABS: 10.0000 mg | ORAL_TABLET | Freq: Every day | ORAL | Status: DC

## 2013-11-06 MED ORDER — ESOMEPRAZOLE MAGNESIUM 40 MG PO CPDR: DELAYED_RELEASE_CAPSULE | ORAL | Status: DC

## 2013-11-06 NOTE — Patient Instructions (Signed)

## 2013-11-06 NOTE — Progress Notes (Signed)
   Subjective:    Patient ID: Lori Schroeder, female    DOB: 09/04/1956, 58 y.o.   MRN: [id number]  Hypertension This is a chronic problem. The current episode started more than 1 year ago. The problem has been resolved since onset. The problem is controlled. Associated symptoms include neck pain. Pertinent negatives include no blurred vision, chest pain, headaches, palpitations, shortness of breath or sweats. There are no associated agents to hypertension. Risk factors for coronary artery disease include dyslipidemia, family history and post-menopausal state. Past treatments include ACE inhibitors and diuretics. The current treatment provides moderate improvement. Compliance problems include diet and exercise.   Hyperlipidemia This is a chronic problem. The current episode started more than 1 year ago. The problem is controlled. Recent lipid tests were reviewed and are normal. She has no history of diabetes, hypothyroidism or obesity. Pertinent negatives include no chest pain or shortness of breath. Current antihyperlipidemic treatment includes statins. The current treatment provides moderate improvement of lipids. Compliance problems include adherence to diet and adherence to exercise.  Risk factors for coronary artery disease include dyslipidemia, family history, hypertension and post-menopausal.  GERD nexium working well. No symptoms when she takes meds.  Neck pain- patient is currently receiving physicla therapy- on tizanidine and lyrica which helps some with pain   Review of Systems  HENT: Negative.   Eyes: Negative for blurred vision.  Respiratory: Negative for shortness of breath.   Cardiovascular: Negative for chest pain and palpitations.  Musculoskeletal: Positive for neck pain.  Neurological: Negative for headaches.  Psychiatric/Behavioral: Negative.   All other systems reviewed and are negative.       Objective:   Physical Exam  Constitutional: She is oriented to person, place,  and time. She appears well-developed and well-nourished.  HENT:  Nose: Nose normal.  Mouth/Throat: Oropharynx is clear and moist.  Eyes: EOM are normal.  Neck: Trachea normal, normal range of motion and full passive range of motion without pain. Neck supple. No JVD present. Carotid bruit is not present. No thyromegaly present.  Cardiovascular: Normal rate, regular rhythm, normal heart sounds and intact distal pulses.  Exam reveals no gallop and no friction rub.   No murmur heard. Pulmonary/Chest: Effort normal and breath sounds normal.  Abdominal: Soft. Bowel sounds are normal. She exhibits no distension and no mass. There is no tenderness.  Musculoskeletal: Normal range of motion.  Lymphadenopathy:    She has no cervical adenopathy.  Neurological: She is alert and oriented to person, place, and time. She has normal reflexes.  Skin: Skin is warm and dry.  Psychiatric: She has a normal mood and affect. Her behavior is normal. Judgment and thought content normal.   BP 107/67  Pulse 62  Temp(Src) 96.6 F (35.9 C) (Oral)  Ht $R'4\' 11"'Ai$  (1.499 m)  Wt 134 lb (60.782 kg)  BMI 27.05 kg/m2        Assessment & Plan:  1. HYPERTENSION Low NA diet - lisinopril-hydrochlorothiazide (PRINZIDE,ZESTORETIC) 10-12.5 MG per tablet; Take 1 tablet by mouth daily.  Dispense: 90 tablet; Refill: 1 - CMP14+EGFR  2. HYPERLIPIDEMIA Low fat diet and exercise - atorvastatin (LIPITOR) 10 MG tablet; Take 1 tablet (10 mg total) by mouth daily.  Dispense: 90 tablet; Refill: 1 - NMR, lipoprofile  3. GERD Avoid spicy and fatty foods - esomeprazole (NEXIUM) 40 MG capsule; TAKE 1 CAPSULE (40 MG TOTAL)  BY MOUTH 2 (TWO) TIMES DAILY.  Dispense: 180 capsule; Refill: [place], FNP

## 2013-11-07 LAB — CMP14+EGFR
A/G RATIO: 1.8 (ref 1.1–2.5)
ALK PHOS: 127 IU/L — AB (ref 39–117)
ALT: 20 IU/L (ref 0–32)
AST: 13 IU/L (ref 0–40)
Albumin: 4.6 g/dL (ref 3.5–5.5)
BILIRUBIN TOTAL: 0.4 mg/dL (ref 0.0–1.2)
BUN / CREAT RATIO: 13 (ref 9–23)
BUN: 9 mg/dL (ref 6–24)
CO2: 25 mmol/L (ref 18–29)
CREATININE: 0.68 mg/dL (ref 0.57–1.00)
Calcium: 10.3 mg/dL — ABNORMAL HIGH (ref 8.7–10.2)
Chloride: 95 mmol/L — ABNORMAL LOW (ref 97–108)
GFR calc Af Amer: 113 mL/min/{1.73_m2} (ref >59)
GFR, EST NON AFRICAN AMERICAN: 98 mL/min/{1.73_m2} (ref >59)
GLOBULIN, TOTAL: 2.6 g/dL (ref 1.5–4.5)
Glucose: 114 mg/dL — ABNORMAL HIGH (ref 65–99)
POTASSIUM: 4.1 mmol/L (ref 3.5–5.2)
SODIUM: 138 mmol/L (ref 134–144)
Total Protein: 7.2 g/dL (ref 6.0–8.5)

## 2013-11-07 LAB — NMR, LIPOPROFILE
Cholesterol: 139 mg/dL (ref <200)
HDL Cholesterol by NMR: 43 mg/dL (ref >=40)
HDL PARTICLE NUMBER: 30.9 umol/L (ref >=30.5)
LDL Particle Number: 1279 nmol/L — ABNORMAL HIGH (ref <1000)
LDL SIZE: 20.2 nm — AB (ref >20.5)
LDLC SERPL CALC-MCNC: 75 mg/dL (ref <100)
LP-IR SCORE: 65 — AB (ref <=45)
SMALL LDL PARTICLE NUMBER: 790 nmol/L — AB (ref <=527)
Triglycerides by NMR: 106 mg/dL (ref <150)

## 2013-11-11 ENCOUNTER — Ambulatory Visit: Payer: Managed Care, Other (non HMO) | Admitting: Physical Therapy

## 2013-11-14 ENCOUNTER — Ambulatory Visit: Payer: Managed Care, Other (non HMO) | Admitting: Physical Therapy

## 2013-11-19 ENCOUNTER — Encounter: Payer: PRIVATE HEALTH INSURANCE | Admitting: Physical Therapy

## 2013-11-20 ENCOUNTER — Encounter: Payer: PRIVATE HEALTH INSURANCE | Admitting: Physical Therapy

## 2013-11-25 ENCOUNTER — Encounter: Payer: PRIVATE HEALTH INSURANCE | Admitting: Physical Therapy

## 2013-11-27 ENCOUNTER — Encounter: Payer: PRIVATE HEALTH INSURANCE | Admitting: *Deleted

## 2013-12-03 ENCOUNTER — Ambulatory Visit: Payer: Managed Care, Other (non HMO) | Attending: Physician Assistant | Admitting: Physical Therapy

## 2013-12-03 DIAGNOSIS — R5381 Other malaise: Secondary | ICD-10-CM | POA: Insufficient documentation

## 2013-12-03 DIAGNOSIS — R293 Abnormal posture: Secondary | ICD-10-CM | POA: Insufficient documentation

## 2013-12-03 DIAGNOSIS — M542 Cervicalgia: Secondary | ICD-10-CM | POA: Insufficient documentation

## 2013-12-03 DIAGNOSIS — M2560 Stiffness of unspecified joint, not elsewhere classified: Secondary | ICD-10-CM | POA: Insufficient documentation

## 2013-12-03 DIAGNOSIS — IMO0001 Reserved for inherently not codable concepts without codable children: Secondary | ICD-10-CM | POA: Insufficient documentation

## 2013-12-04 ENCOUNTER — Encounter: Payer: PRIVATE HEALTH INSURANCE | Admitting: Physical Therapy

## 2013-12-09 ENCOUNTER — Encounter: Payer: PRIVATE HEALTH INSURANCE | Admitting: Physical Therapy

## 2013-12-12 ENCOUNTER — Encounter: Payer: PRIVATE HEALTH INSURANCE | Admitting: Physical Therapy

## 2013-12-16 ENCOUNTER — Telehealth: Payer: Self-pay | Admitting: General Practice

## 2013-12-16 ENCOUNTER — Ambulatory Visit: Payer: Medicare Other | Admitting: General Practice

## 2013-12-17 ENCOUNTER — Ambulatory Visit (INDEPENDENT_AMBULATORY_CARE_PROVIDER_SITE_OTHER): Payer: 59 | Admitting: General Practice

## 2013-12-17 ENCOUNTER — Encounter: Payer: Self-pay | Admitting: General Practice

## 2013-12-17 VITALS — BP 111/66 | HR 72 | Temp 97.1°F | Ht 59.0 in | Wt 135.2 lb

## 2013-12-17 DIAGNOSIS — J00 Acute nasopharyngitis [common cold]: Secondary | ICD-10-CM

## 2013-12-17 NOTE — Patient Instructions (Signed)

## 2013-12-17 NOTE — Progress Notes (Signed)
   Subjective:    Patient ID: Lori Schroeder, female    DOB: 09/27/1956, 58 y.o.   MRN: [id number]  Cough This is a new problem. The problem has been unchanged. The cough is productive of sputum. Associated symptoms include nasal congestion and postnasal drip. Pertinent negatives include no chest pain, chills, fever, headaches, sore throat or wheezing. Nothing aggravates the symptoms. She has tried nothing for the symptoms. Her past medical history is significant for bronchitis. There is no history of asthma or pneumonia.      Review of Systems  Constitutional: Negative for fever and chills.  HENT: Positive for postnasal drip. Negative for sore throat.   Respiratory: Positive for cough. Negative for chest tightness and wheezing.   Cardiovascular: Negative for chest pain and palpitations.  Neurological: Negative for dizziness, weakness and headaches.       Objective:   Physical Exam  Constitutional: She is oriented to person, place, and time. She appears well-developed and well-nourished.  HENT:  Head: Normocephalic and atraumatic.  Right Ear: External ear normal.  Left Ear: External ear normal.  Mouth/Throat: Oropharynx is clear and moist.  Eyes: EOM are normal. Pupils are equal, round, and reactive to light.  Cardiovascular: Normal rate, regular rhythm and normal heart sounds.   Pulmonary/Chest: Effort normal and breath sounds normal. No respiratory distress. She exhibits no tenderness.  Neurological: She is alert and oriented to person, place, and time.  Skin: Skin is warm and dry.  Psychiatric: She has a normal mood and affect.          Assessment & Plan:  1. Common cold -patient information provided and discussed -RTO if symptoms worsen or unresolved Patient verbalized understanding Erby Pian, FNP-C

## 2013-12-22 DIAGNOSIS — M5030 Other cervical disc degeneration, unspecified cervical region: Secondary | ICD-10-CM | POA: Diagnosis not present

## 2013-12-24 DIAGNOSIS — G894 Chronic pain syndrome: Secondary | ICD-10-CM | POA: Diagnosis not present

## 2013-12-24 DIAGNOSIS — M961 Postlaminectomy syndrome, not elsewhere classified: Secondary | ICD-10-CM | POA: Diagnosis not present

## 2014-02-12 DIAGNOSIS — M4802 Spinal stenosis, cervical region: Secondary | ICD-10-CM | POA: Diagnosis not present

## 2014-03-03 DIAGNOSIS — N9089 Other specified noninflammatory disorders of vulva and perineum: Secondary | ICD-10-CM | POA: Diagnosis not present

## 2014-03-25 DIAGNOSIS — G894 Chronic pain syndrome: Secondary | ICD-10-CM | POA: Diagnosis not present

## 2014-04-08 DIAGNOSIS — M25519 Pain in unspecified shoulder: Secondary | ICD-10-CM | POA: Diagnosis not present

## 2014-05-11 DIAGNOSIS — G894 Chronic pain syndrome: Secondary | ICD-10-CM | POA: Diagnosis not present

## 2014-05-11 DIAGNOSIS — M25519 Pain in unspecified shoulder: Secondary | ICD-10-CM | POA: Diagnosis not present

## 2014-06-07 ENCOUNTER — Other Ambulatory Visit: Payer: Self-pay | Admitting: *Deleted

## 2014-06-07 DIAGNOSIS — E785 Hyperlipidemia, unspecified: Secondary | ICD-10-CM

## 2014-06-07 MED ORDER — ATORVASTATIN CALCIUM 10 MG PO TABS: 10.0000 mg | ORAL_TABLET | Freq: Every day | ORAL | Status: DC

## 2014-06-17 DIAGNOSIS — M25519 Pain in unspecified shoulder: Secondary | ICD-10-CM | POA: Diagnosis not present

## 2014-06-17 DIAGNOSIS — M4802 Spinal stenosis, cervical region: Secondary | ICD-10-CM | POA: Diagnosis not present

## 2014-06-23 DIAGNOSIS — M4802 Spinal stenosis, cervical region: Secondary | ICD-10-CM | POA: Diagnosis not present

## 2014-06-30 ENCOUNTER — Ambulatory Visit: Payer: 59 | Admitting: Family Medicine

## 2014-07-10 ENCOUNTER — Ambulatory Visit (INDEPENDENT_AMBULATORY_CARE_PROVIDER_SITE_OTHER): Payer: 59 | Admitting: Nurse Practitioner

## 2014-07-10 ENCOUNTER — Encounter: Payer: Self-pay | Admitting: Nurse Practitioner

## 2014-07-10 VITALS — BP 115/64 | HR 67 | Temp 98.0°F | Ht 59.0 in | Wt 141.4 lb

## 2014-07-10 DIAGNOSIS — I10 Essential (primary) hypertension: Secondary | ICD-10-CM | POA: Diagnosis not present

## 2014-07-10 DIAGNOSIS — K219 Gastro-esophageal reflux disease without esophagitis: Secondary | ICD-10-CM | POA: Diagnosis not present

## 2014-07-10 DIAGNOSIS — E785 Hyperlipidemia, unspecified: Secondary | ICD-10-CM | POA: Diagnosis not present

## 2014-07-10 NOTE — Patient Instructions (Signed)

## 2014-07-10 NOTE — Progress Notes (Signed)
   Subjective:    Patient ID: Lori Schroeder, female    DOB: 07-20-1956, 58 y.o.   MRN: [id number]  Patient here today for follow up of chronic medical problems.   Hypertension This is a chronic problem. The current episode started more than 1 year ago. The problem has been resolved since onset. The problem is controlled. Associated symptoms include neck pain. Pertinent negatives include no blurred vision, chest pain, headaches, palpitations, shortness of breath or sweats. There are no associated agents to hypertension. Risk factors for coronary artery disease include dyslipidemia, family history and post-menopausal state. Past treatments include ACE inhibitors and diuretics. The current treatment provides moderate improvement. Compliance problems include diet and exercise.   Hyperlipidemia This is a chronic problem. The current episode started more than 1 year ago. The problem is controlled. Recent lipid tests were reviewed and are normal. She has no history of diabetes, hypothyroidism or obesity. Pertinent negatives include no chest pain or shortness of breath. Current antihyperlipidemic treatment includes statins. The current treatment provides moderate improvement of lipids. Compliance problems include adherence to diet and adherence to exercise.  Risk factors for coronary artery disease include dyslipidemia, family history, hypertension and post-menopausal.  GERD nexium working well. No symptoms when she takes meds.  * Patient is scheduled for an anterior cervical fusion Oct 21,2015   Review of Systems  HENT: Negative.   Eyes: Negative for blurred vision.  Respiratory: Negative for shortness of breath.   Cardiovascular: Negative for chest pain and palpitations.  Musculoskeletal: Positive for neck pain.  Neurological: Negative for headaches.  Psychiatric/Behavioral: Negative.   All other systems reviewed and are negative.      Objective:   Physical Exam  Constitutional: She is oriented  to person, place, and time. She appears well-developed and well-nourished.  HENT:  Nose: Nose normal.  Mouth/Throat: Oropharynx is clear and moist.  Eyes: EOM are normal.  Neck: Trachea normal, normal range of motion and full passive range of motion without pain. Neck supple. No JVD present. Carotid bruit is not present. No thyromegaly present.  Cardiovascular: Normal rate, regular rhythm, normal heart sounds and intact distal pulses.  Exam reveals no gallop and no friction rub.   No murmur heard. Pulmonary/Chest: Effort normal and breath sounds normal.  Abdominal: Soft. Bowel sounds are normal. She exhibits no distension and no mass. There is no tenderness.  Musculoskeletal: Normal range of motion.  Lymphadenopathy:    She has no cervical adenopathy.  Neurological: She is alert and oriented to person, place, and time. She has normal reflexes.  Skin: Skin is warm and dry.  Psychiatric: She has a normal mood and affect. Her behavior is normal. Judgment and thought content normal.   BP 115/64  Pulse 67  Temp(Src) 98 F (36.7 C) (Oral)  Ht $R'4\' 11"'qb$  (1.499 m)  Wt 141 lb 6.4 oz (64.139 kg)  BMI 28.54 kg/m2        Assessment & Plan:   1. Essential hypertension Low NA+ diet - CMP14+EGFR  2. Gastroesophageal reflux disease without esophagitis Avoid caffeine and spicy foods  3. Hyperlipidemia with target LDL less than 100 Watch fat in diet - NMR, lipoprofile  STOP smoking Labs pending Health maintenance reviewed Diet and exercise encouraged Continue all meds Follow up  In 3 months   [place], FNP

## 2014-07-11 LAB — CMP14+EGFR
ALBUMIN: 4.7 g/dL (ref 3.5–5.5)
ALT: 29 IU/L (ref 0–32)
AST: 16 IU/L (ref 0–40)
Albumin/Globulin Ratio: 1.9 (ref 1.1–2.5)
Alkaline Phosphatase: 136 IU/L — ABNORMAL HIGH (ref 39–117)
BILIRUBIN TOTAL: 0.4 mg/dL (ref 0.0–1.2)
BUN/Creatinine Ratio: 13 (ref 9–23)
BUN: 10 mg/dL (ref 6–24)
CALCIUM: 10.2 mg/dL (ref 8.7–10.2)
CHLORIDE: 93 mmol/L — AB (ref 97–108)
CO2: 26 mmol/L (ref 18–29)
Creatinine, Ser: 0.77 mg/dL (ref 0.57–1.00)
GFR calc non Af Amer: 86 mL/min/{1.73_m2} (ref >59)
GFR, EST AFRICAN AMERICAN: 99 mL/min/{1.73_m2} (ref >59)
GLUCOSE: 105 mg/dL — AB (ref 65–99)
Globulin, Total: 2.5 g/dL (ref 1.5–4.5)
POTASSIUM: 4.9 mmol/L (ref 3.5–5.2)
Sodium: 135 mmol/L (ref 134–144)
TOTAL PROTEIN: 7.2 g/dL (ref 6.0–8.5)

## 2014-07-11 LAB — NMR, LIPOPROFILE
Cholesterol: 189 mg/dL (ref 100–199)
HDL Cholesterol by NMR: 46 mg/dL (ref >39)
HDL Particle Number: 33.5 umol/L (ref >=30.5)
LDL PARTICLE NUMBER: 1138 nmol/L — AB (ref <1000)
LDL SIZE: 20.9 nm (ref >20.5)
LDLC SERPL CALC-MCNC: 106 mg/dL — AB (ref 0–99)
LP-IR Score: 80 — ABNORMAL HIGH (ref <=45)
Small LDL Particle Number: 199 nmol/L (ref <=527)
TRIGLYCERIDES BY NMR: 187 mg/dL — AB (ref 0–149)

## 2014-07-15 ENCOUNTER — Telehealth: Payer: Self-pay | Admitting: Family Medicine

## 2014-07-15 DIAGNOSIS — G894 Chronic pain syndrome: Secondary | ICD-10-CM | POA: Diagnosis not present

## 2014-07-15 DIAGNOSIS — M25511 Pain in right shoulder: Secondary | ICD-10-CM | POA: Diagnosis not present

## 2014-07-15 DIAGNOSIS — M25512 Pain in left shoulder: Secondary | ICD-10-CM | POA: Diagnosis not present

## 2014-07-15 DIAGNOSIS — M461 Sacroiliitis, not elsewhere classified: Secondary | ICD-10-CM | POA: Diagnosis not present

## 2014-07-15 DIAGNOSIS — I10 Essential (primary) hypertension: Secondary | ICD-10-CM | POA: Diagnosis not present

## 2014-07-15 DIAGNOSIS — M5417 Radiculopathy, lumbosacral region: Secondary | ICD-10-CM | POA: Diagnosis not present

## 2014-07-15 DIAGNOSIS — M5124 Other intervertebral disc displacement, thoracic region: Secondary | ICD-10-CM | POA: Diagnosis not present

## 2014-07-15 NOTE — Telephone Encounter (Signed)
Message copied by Waverly Ferrari on Wed Jul 15, 2014 12:40 PM ------      Message from: Chevis Pretty      Created: Sat Jul 11, 2014  9:31 AM       Kidney and liver function stable      Cholesterol looks good      Continue current meds- low fat diet and exercise and recheck in 3 months       ------

## 2014-07-17 ENCOUNTER — Encounter: Payer: Self-pay | Admitting: Nurse Practitioner

## 2014-07-22 DIAGNOSIS — M4722 Other spondylosis with radiculopathy, cervical region: Secondary | ICD-10-CM | POA: Diagnosis present

## 2014-07-22 DIAGNOSIS — F4542 Pain disorder with related psychological factors: Secondary | ICD-10-CM | POA: Diagnosis present

## 2014-07-22 DIAGNOSIS — F17210 Nicotine dependence, cigarettes, uncomplicated: Secondary | ICD-10-CM | POA: Diagnosis present

## 2014-07-22 DIAGNOSIS — K219 Gastro-esophageal reflux disease without esophagitis: Secondary | ICD-10-CM | POA: Diagnosis present

## 2014-07-22 DIAGNOSIS — M5412 Radiculopathy, cervical region: Secondary | ICD-10-CM | POA: Diagnosis not present

## 2014-07-22 DIAGNOSIS — I10 Essential (primary) hypertension: Secondary | ICD-10-CM | POA: Diagnosis present

## 2014-07-22 DIAGNOSIS — G894 Chronic pain syndrome: Secondary | ICD-10-CM | POA: Diagnosis present

## 2014-08-01 ENCOUNTER — Other Ambulatory Visit: Payer: Self-pay | Admitting: Nurse Practitioner

## 2014-09-15 DIAGNOSIS — M5417 Radiculopathy, lumbosacral region: Secondary | ICD-10-CM | POA: Diagnosis not present

## 2014-09-15 DIAGNOSIS — M461 Sacroiliitis, not elsewhere classified: Secondary | ICD-10-CM | POA: Diagnosis not present

## 2014-09-15 DIAGNOSIS — M961 Postlaminectomy syndrome, not elsewhere classified: Secondary | ICD-10-CM | POA: Diagnosis not present

## 2014-09-15 DIAGNOSIS — G894 Chronic pain syndrome: Secondary | ICD-10-CM | POA: Diagnosis not present

## 2014-10-08 ENCOUNTER — Encounter: Payer: Self-pay | Admitting: Nurse Practitioner

## 2014-10-08 ENCOUNTER — Ambulatory Visit (INDEPENDENT_AMBULATORY_CARE_PROVIDER_SITE_OTHER): Payer: 59 | Admitting: Nurse Practitioner

## 2014-10-08 VITALS — BP 125/78 | HR 108 | Temp 97.5°F | Ht 59.0 in | Wt 139.0 lb

## 2014-10-08 DIAGNOSIS — J029 Acute pharyngitis, unspecified: Secondary | ICD-10-CM | POA: Diagnosis not present

## 2014-10-08 DIAGNOSIS — J069 Acute upper respiratory infection, unspecified: Secondary | ICD-10-CM | POA: Diagnosis not present

## 2014-10-08 MED ORDER — AZITHROMYCIN 250 MG PO TABS: ORAL_TABLET | ORAL | Status: DC

## 2014-10-08 MED ORDER — HYDROCODONE-HOMATROPINE 5-1.5 MG/5ML PO SYRP
5.0000 mL | ORAL_SOLUTION | Freq: Three times a day (TID) | ORAL | Status: DC | PRN
Start: 2014-10-08 — End: 2014-12-03

## 2014-10-08 NOTE — Progress Notes (Signed)
Subjective:    Patient ID: Lori Schroeder, female    DOB: 25-Aug-1956, 59 y.o.   MRN: [id number]  HPI Patient in today c/o cough and congestion that started 3 weeks ago - Yesterday throat started hurting and had low grade fever.    Review of Systems  Constitutional: Positive for fever and fatigue. Negative for chills and appetite change.  HENT: Positive for congestion, postnasal drip, rhinorrhea, sore throat, trouble swallowing and voice change.   Respiratory: Positive for cough.   Cardiovascular: Negative.   Gastrointestinal: Negative.   Genitourinary: Negative.   Musculoskeletal: Negative.   Neurological: Negative.   Psychiatric/Behavioral: Negative.   All other systems reviewed and are negative.      Objective:   Physical Exam  Constitutional: She is oriented to person, place, and time. She appears well-developed and well-nourished. No distress.  HENT:  Right Ear: Hearing, tympanic membrane, external ear and ear canal normal.  Left Ear: Hearing, tympanic membrane, external ear and ear canal normal.  Nose: Mucosal edema and rhinorrhea present. Right sinus exhibits no maxillary sinus tenderness and no frontal sinus tenderness. Left sinus exhibits no maxillary sinus tenderness and no frontal sinus tenderness.  Mouth/Throat: Uvula is midline. Posterior oropharyngeal erythema (mild) present.  Eyes: Pupils are equal, round, and reactive to light.  Neck: Neck supple.  Cardiovascular: Normal rate, regular rhythm and normal heart sounds.   Pulmonary/Chest: Effort normal and breath sounds normal. No respiratory distress. She has no wheezes. She has no rales.  Dry cough  Abdominal: Soft.  Lymphadenopathy:    She has no cervical adenopathy.  Neurological: She is alert and oriented to person, place, and time.  Skin: Skin is warm and dry.  Psychiatric: She has a normal mood and affect. Her behavior is normal. Judgment and thought content normal.   BP 125/78 mmHg  Pulse 108  Temp(Src)  97.5 F (36.4 C) (Oral)  Ht 4\' 11"  (1.499 m)  Wt 139 lb (63.05 kg)  BMI 28.06 kg/m2        Assessment & Plan:   1. Acute pharyngitis, unspecified pharyngitis type   2. Upper respiratory infection with cough and congestion    Meds ordered this encounter  Medications  . azithromycin (ZITHROMAX Z-PAK) 250 MG tablet    Sig: As directed    Dispense:  6 each    Refill:  0    Order Specific Question:  Supervising Provider    Answer:  Chipper Herb [[id number]]  . HYDROcodone-homatropine (HYCODAN) 5-1.5 MG/5ML syrup    Sig: Take 5 mLs by mouth every 8 (eight) hours as needed for cough.    Dispense:  120 mL    Refill:  0    Order Specific Question:  Supervising Provider    Answer:  Chipper Herb [[id number]]   1. Take meds as prescribed 2. Use a cool mist humidifier especially during the winter months and when heat has been humid. 3. Use saline nose sprays frequently 4. Saline irrigations of the nose can be very helpful if done frequently.  * 4X daily for 1 week*  * Use of a nettie pot can be helpful with this. Follow directions with this* 5. Drink plenty of fluids 6. Keep thermostat turn down low 7.For any cough or congestion  Use plain Mucinex- regular strength or max strength is fine   * Children- consult with Pharmacist for dosing 8. For fever or aces or pains- take tylenol or ibuprofen appropriate for age and weight.  * for fevers  greater than 101 orally you may alternate ibuprofen and tylenol every  3 hours.   Mary-Margaret Hassell Done, FNP

## 2014-10-08 NOTE — Patient Instructions (Signed)

## 2014-10-13 DIAGNOSIS — Z981 Arthrodesis status: Secondary | ICD-10-CM | POA: Insufficient documentation

## 2014-10-13 DIAGNOSIS — F17210 Nicotine dependence, cigarettes, uncomplicated: Secondary | ICD-10-CM | POA: Diagnosis not present

## 2014-10-13 DIAGNOSIS — M47892 Other spondylosis, cervical region: Secondary | ICD-10-CM | POA: Diagnosis not present

## 2014-10-13 DIAGNOSIS — M545 Low back pain: Secondary | ICD-10-CM | POA: Diagnosis not present

## 2014-10-13 DIAGNOSIS — M791 Myalgia: Secondary | ICD-10-CM | POA: Diagnosis not present

## 2014-10-13 DIAGNOSIS — M5136 Other intervertebral disc degeneration, lumbar region: Secondary | ICD-10-CM | POA: Diagnosis not present

## 2014-11-10 DIAGNOSIS — Z981 Arthrodesis status: Secondary | ICD-10-CM | POA: Diagnosis not present

## 2014-11-10 DIAGNOSIS — M47896 Other spondylosis, lumbar region: Secondary | ICD-10-CM | POA: Diagnosis not present

## 2014-11-10 DIAGNOSIS — M5136 Other intervertebral disc degeneration, lumbar region: Secondary | ICD-10-CM | POA: Diagnosis not present

## 2014-11-10 DIAGNOSIS — M4716 Other spondylosis with myelopathy, lumbar region: Secondary | ICD-10-CM | POA: Diagnosis not present

## 2014-11-23 DIAGNOSIS — Z1231 Encounter for screening mammogram for malignant neoplasm of breast: Secondary | ICD-10-CM | POA: Diagnosis not present

## 2014-11-24 DIAGNOSIS — M546 Pain in thoracic spine: Secondary | ICD-10-CM | POA: Diagnosis not present

## 2014-11-24 DIAGNOSIS — M5414 Radiculopathy, thoracic region: Secondary | ICD-10-CM | POA: Diagnosis not present

## 2014-11-24 DIAGNOSIS — G894 Chronic pain syndrome: Secondary | ICD-10-CM | POA: Diagnosis not present

## 2014-11-25 ENCOUNTER — Other Ambulatory Visit: Payer: Self-pay | Admitting: Obstetrics & Gynecology

## 2014-11-25 DIAGNOSIS — R928 Other abnormal and inconclusive findings on diagnostic imaging of breast: Secondary | ICD-10-CM

## 2014-11-27 ENCOUNTER — Other Ambulatory Visit: Payer: Self-pay | Admitting: Family Medicine

## 2014-12-03 ENCOUNTER — Encounter (INDEPENDENT_AMBULATORY_CARE_PROVIDER_SITE_OTHER): Payer: Self-pay

## 2014-12-03 ENCOUNTER — Ambulatory Visit (INDEPENDENT_AMBULATORY_CARE_PROVIDER_SITE_OTHER): Payer: 59

## 2014-12-03 ENCOUNTER — Encounter: Payer: Self-pay | Admitting: Nurse Practitioner

## 2014-12-03 ENCOUNTER — Ambulatory Visit (INDEPENDENT_AMBULATORY_CARE_PROVIDER_SITE_OTHER): Payer: 59 | Admitting: Nurse Practitioner

## 2014-12-03 VITALS — BP 102/59 | HR 64 | Temp 98.8°F | Ht 59.0 in | Wt 138.0 lb

## 2014-12-03 DIAGNOSIS — M2041 Other hammer toe(s) (acquired), right foot: Secondary | ICD-10-CM | POA: Diagnosis not present

## 2014-12-03 DIAGNOSIS — M25571 Pain in right ankle and joints of right foot: Secondary | ICD-10-CM

## 2014-12-03 MED ORDER — MELOXICAM 15 MG PO TABS: 15.0000 mg | ORAL_TABLET | Freq: Every day | ORAL | Status: DC

## 2014-12-03 MED ORDER — ESOMEPRAZOLE MAGNESIUM 40 MG PO CPDR: DELAYED_RELEASE_CAPSULE | ORAL | Status: DC

## 2014-12-03 NOTE — Progress Notes (Signed)
   Subjective:    Patient ID: Lori Schroeder, female    DOB: [birth year], 59 y.o.   MRN: [id number]  HPI Patient here today c/o right toe pain- started about 2 months ago and is getting worse. Thought sh ehad an ingrown toenail. Says that toe feels like it turns downward when she walks.    Review of Systems  Constitutional: Negative.   HENT: Negative.   Respiratory: Negative.   Cardiovascular: Negative.   Genitourinary: Negative.   Neurological: Negative.   Psychiatric/Behavioral: Negative.   All other systems reviewed and are negative.      Objective:   Physical Exam  Constitutional: She is oriented to person, place, and time. She appears well-developed and well-nourished.  Cardiovascular: Normal rate, regular rhythm and normal heart sounds.   Pulmonary/Chest: Effort normal and breath sounds normal.  Musculoskeletal:  Mild edema of distal phalange right second toe with pain on palaption at distal PIP joint and medially down the side of toe.  Neurological: She is alert and oriented to person, place, and time.  Skin: Skin is warm and dry.  Psychiatric: She has a normal mood and affect. Her behavior is normal. Judgment and thought content normal.    BP 102/59 mmHg  Pulse 64  Temp(Src) 98.8 F (37.1 C) (Oral)  Ht 4\' 11"  (1.499 m)  Wt 138 lb (62.596 kg)  BMI 27.86 kg/m2  Right 2nd toe x ray- negative for fracture- hammertoe-Preliminary reading by Ronnald Collum, FNP  [facility] [place]       Assessment & Plan:  1. Pain in joint, ankle and foot, right - DG Foot Complete Right; Future  2. Hammer toe of right foot Soak in epsom salt bid - meloxicam (MOBIC) 15 MG tablet; Take 1 tablet (15 mg total) by mouth daily.  Dispense: 30 tablet; Refill: [place], FNP

## 2014-12-03 NOTE — Patient Instructions (Signed)
Hammer Toes Hammer toes is a condition in which one or more of your toes is permanently flexed. CAUSES  This happens when a muscle imbalance or abnormal bone length makes your small toes buckle. This causes the toe joint to contract and the strong cord-like bands that attach muscles to the bones (tendons) in your toes to shorten.  SIGNS AND SYMPTOMS  Common symptoms of flexible hammer toes include:   A buildup of skin cells (corns). Corns occur where boney bumps come in frequent contact with hard surfaces. For example, where your shoes press and rub.  Irritation.  Inflammation.  Pain.  Limited motion in your toes. DIAGNOSIS  Hammer toes are diagnosed through a physical exam of your toes. During the exam, your health care provider may try to reproduce your symptoms by manipulating your foot. Often, X-ray exams are done to determine the degree of deformity and to make sure that the cause is not a fracture.  TREATMENT  Hammer toes can be treated with corrective surgery. There are several types of surgical procedures that can treat hammer toes. The most common procedures include:  Arthroplasty--A portion of the joint is surgically removed and your toe is straightened. The gap fills in with fibrous tissue. This procedure helps treat pain and deformity and helps restore function.  Fusion--Cartilage between the two bones of the affected joint is taken out and the bones fuse together into one longer bone. This helps keep your toe stable and reduces pain but leaves your toe stiff, yet straight.  Implantation--A portion of your bone is removed and replaced with an implant to restore motion.  Flexor tendon transfers--This procedure repositions the tendons that curl the toes down (flexor tendons). This may be done to release the deforming force that causes your toe to buckle. Several of these procedures require fixing your toe with a pin that is visible at the tip of your toe. The pin keeps the toe  straight during healing. Your health care provider will remove the pin usually within 4-8 weeks after the procedure.  Document Released: 09/15/2000 Document Revised: 09/23/2013 Document Reviewed: 05/26/2013 [facility] Patient Information 2015 [place], [place]. This information is not intended to replace advice given to you by your health care provider. Make sure you discuss any questions you have with your health care provider.

## 2014-12-07 ENCOUNTER — Ambulatory Visit
Admission: RE | Admit: 2014-12-07 | Discharge: 2014-12-07 | Disposition: A | Discharge status: Home / Self Care | Payer: Medicare Other | Source: Ambulatory Visit | Attending: Obstetrics & Gynecology | Admitting: Obstetrics & Gynecology

## 2014-12-07 ENCOUNTER — Other Ambulatory Visit: Payer: Self-pay | Admitting: Obstetrics & Gynecology

## 2014-12-07 ENCOUNTER — Ambulatory Visit
Admission: RE | Admit: 2014-12-07 | Discharge: 2014-12-07 | Disposition: A | Discharge status: Home / Self Care | Payer: Managed Care, Other (non HMO) | Source: Ambulatory Visit | Attending: Obstetrics & Gynecology | Admitting: Obstetrics & Gynecology

## 2014-12-07 DIAGNOSIS — R928 Other abnormal and inconclusive findings on diagnostic imaging of breast: Secondary | ICD-10-CM

## 2014-12-07 DIAGNOSIS — R922 Inconclusive mammogram: Secondary | ICD-10-CM | POA: Diagnosis not present

## 2014-12-15 ENCOUNTER — Encounter: Payer: Self-pay | Admitting: Nurse Practitioner

## 2014-12-15 ENCOUNTER — Ambulatory Visit (INDEPENDENT_AMBULATORY_CARE_PROVIDER_SITE_OTHER): Payer: 59 | Admitting: Nurse Practitioner

## 2014-12-15 VITALS — BP 130/78 | HR 91 | Temp 97.8°F | Resp 18 | Wt 140.4 lb

## 2014-12-15 DIAGNOSIS — J069 Acute upper respiratory infection, unspecified: Secondary | ICD-10-CM | POA: Diagnosis not present

## 2014-12-15 MED ORDER — HYDROCODONE-HOMATROPINE 5-1.5 MG/5ML PO SYRP: 5.0000 mL | ORAL_SOLUTION | Freq: Four times a day (QID) | ORAL | Status: DC | PRN

## 2014-12-15 NOTE — Patient Instructions (Signed)

## 2014-12-15 NOTE — Progress Notes (Signed)
  Subjective:     Lori Schroeder is a 59 y.o. female who presents for evaluation of sinus pain. Symptoms include: congestion, cough, headaches, mouth breathing, nasal congestion, post nasal drip, sinus pressure and sore throat. Onset of symptoms was 5 days ago. Symptoms have been gradually worsening since that time. Past history is significant for hx of bronchitis, pt is a current smoker. . Patient is a smoker  (1/2 ppd x day).  The following portions of the patient's history were reviewed and updated as appropriate: allergies, current medications, past family history, past social history, past surgical history and problem list.  Review of Systems Pertinent items are noted in HPI.   Objective:    BP 130/78 mmHg  Pulse 91  Temp(Src) 97.8 F (36.6 C)  Resp 18  Wt 140 lb 6.4 oz (63.685 kg) General appearance: alert, cooperative and appears stated age Head: Normocephalic, without obvious abnormality, atraumatic Eyes: conjunctivae/corneas clear. PERRL, EOM's intact. Fundi benign. Ears: normal TM's and external ear canals both ears Nose: Nares normal. Septum midline. Mucosa normal. No drainage or sinus tenderness. Throat: lips, mucosa, and tongue normal; teeth and gums normal Neck: no adenopathy, no carotid bruit, no JVD, supple, symmetrical, trachea midline and thyroid not enlarged, symmetric, no tenderness/mass/nodules Lungs: diminished breath sounds LLL and RLL Heart: regular rate and rhythm, S1, S2 normal, no murmur, click, rub or gallop    Assessment:   URI WITH COUGH  Plan:   Meds ordered this encounter  Medications  . HYDROcodone-homatropine (HYCODAN) 5-1.5 MG/5ML syrup    Sig: Take 5 mLs by mouth every 6 (six) hours as needed for cough.    Dispense:  120 mL    Refill:  0    Order Specific Question:  Supervising Provider    Answer:  Chipper Herb [[id number]]    1. Take meds as prescribed 2. Use a cool mist humidifier especially during the winter months and when heat has been  humid. 3. Use saline nose sprays frequently 4. Saline irrigations of the nose can be very helpful if done frequently.  * 4X daily for 1 week*  * Use of a nettie pot can be helpful with this. Follow directions with this* 5. Drink plenty of fluids 6. Keep thermostat turn down low 7.For any cough or congestion  Use plain Mucinex- regular strength or max strength is fine   * Children- consult with Pharmacist for dosing 8. For fever or aces or pains- take tylenol or ibuprofen appropriate for age and weight.  * for fevers greater than 101 orally you may alternate ibuprofen and tylenol every  3 hours.   RTO PRN  Mary-Margaret Hassell Done, FNP

## 2014-12-22 DIAGNOSIS — M461 Sacroiliitis, not elsewhere classified: Secondary | ICD-10-CM | POA: Diagnosis not present

## 2014-12-22 DIAGNOSIS — G894 Chronic pain syndrome: Secondary | ICD-10-CM | POA: Diagnosis not present

## 2014-12-22 DIAGNOSIS — M961 Postlaminectomy syndrome, not elsewhere classified: Secondary | ICD-10-CM | POA: Diagnosis not present

## 2014-12-22 DIAGNOSIS — M5417 Radiculopathy, lumbosacral region: Secondary | ICD-10-CM | POA: Diagnosis not present

## 2015-02-15 ENCOUNTER — Ambulatory Visit: Payer: Medicare Other | Admitting: Nurse Practitioner

## 2015-02-24 DIAGNOSIS — Z79899 Other long term (current) drug therapy: Secondary | ICD-10-CM | POA: Diagnosis not present

## 2015-02-24 DIAGNOSIS — G894 Chronic pain syndrome: Secondary | ICD-10-CM | POA: Diagnosis not present

## 2015-02-24 DIAGNOSIS — M961 Postlaminectomy syndrome, not elsewhere classified: Secondary | ICD-10-CM | POA: Diagnosis not present

## 2015-02-24 DIAGNOSIS — Z5181 Encounter for therapeutic drug level monitoring: Secondary | ICD-10-CM | POA: Diagnosis not present

## 2015-02-24 DIAGNOSIS — M533 Sacrococcygeal disorders, not elsewhere classified: Secondary | ICD-10-CM | POA: Diagnosis not present

## 2015-03-24 ENCOUNTER — Other Ambulatory Visit: Payer: Self-pay | Admitting: Nurse Practitioner

## 2015-03-29 DIAGNOSIS — M533 Sacrococcygeal disorders, not elsewhere classified: Secondary | ICD-10-CM | POA: Diagnosis not present

## 2015-04-09 ENCOUNTER — Encounter: Payer: Self-pay | Admitting: Gastroenterology

## 2015-04-13 DIAGNOSIS — M961 Postlaminectomy syndrome, not elsewhere classified: Secondary | ICD-10-CM | POA: Diagnosis not present

## 2015-04-13 DIAGNOSIS — M533 Sacrococcygeal disorders, not elsewhere classified: Secondary | ICD-10-CM | POA: Diagnosis not present

## 2015-04-13 DIAGNOSIS — G894 Chronic pain syndrome: Secondary | ICD-10-CM | POA: Diagnosis not present

## 2015-04-27 ENCOUNTER — Ambulatory Visit: Payer: 59 | Admitting: Nurse Practitioner

## 2015-05-10 DIAGNOSIS — M5417 Radiculopathy, lumbosacral region: Secondary | ICD-10-CM | POA: Diagnosis not present

## 2015-05-21 ENCOUNTER — Ambulatory Visit: Payer: 59 | Admitting: Nurse Practitioner

## 2015-05-28 ENCOUNTER — Other Ambulatory Visit: Payer: Self-pay | Admitting: Nurse Practitioner

## 2015-05-28 NOTE — Telephone Encounter (Signed)
Last lipids 07/2014

## 2015-05-28 NOTE — Telephone Encounter (Signed)
Refill on lipitor denied- Patient NTBS for follow up and lab work

## 2015-06-14 ENCOUNTER — Ambulatory Visit: Payer: 59 | Admitting: Nurse Practitioner

## 2015-06-22 ENCOUNTER — Other Ambulatory Visit: Payer: Self-pay | Admitting: Nurse Practitioner

## 2015-06-22 NOTE — Telephone Encounter (Signed)
Patient aware and has appointment for Friday

## 2015-06-22 NOTE — Telephone Encounter (Signed)
Last seen 12/15/14  MMM

## 2015-06-22 NOTE — Telephone Encounter (Signed)
no more refills without being seen  

## 2015-06-25 ENCOUNTER — Ambulatory Visit: Payer: 59 | Admitting: Nurse Practitioner

## 2015-06-30 DIAGNOSIS — M533 Sacrococcygeal disorders, not elsewhere classified: Secondary | ICD-10-CM | POA: Diagnosis not present

## 2015-06-30 DIAGNOSIS — M961 Postlaminectomy syndrome, not elsewhere classified: Secondary | ICD-10-CM | POA: Diagnosis not present

## 2015-06-30 DIAGNOSIS — G894 Chronic pain syndrome: Secondary | ICD-10-CM | POA: Diagnosis not present

## 2015-07-20 ENCOUNTER — Encounter: Payer: Self-pay | Admitting: Family Medicine

## 2015-07-20 ENCOUNTER — Ambulatory Visit (INDEPENDENT_AMBULATORY_CARE_PROVIDER_SITE_OTHER): Payer: BLUE CROSS/BLUE SHIELD | Admitting: Family Medicine

## 2015-07-20 VITALS — BP 125/76 | HR 79 | Temp 97.9°F | Ht 59.0 in | Wt 135.8 lb

## 2015-07-20 DIAGNOSIS — J189 Pneumonia, unspecified organism: Secondary | ICD-10-CM | POA: Diagnosis not present

## 2015-07-20 MED ORDER — BENZONATATE 100 MG PO CAPS: 100.0000 mg | ORAL_CAPSULE | Freq: Two times a day (BID) | ORAL | Status: DC | PRN

## 2015-07-20 MED ORDER — LEVOFLOXACIN 500 MG PO TABS: 500.0000 mg | ORAL_TABLET | Freq: Every day | ORAL | Status: DC

## 2015-07-20 NOTE — Patient Instructions (Signed)
Great to meet you!  Come back if you do  Not improve or get worse.   Community-Acquired Pneumonia, Adult Pneumonia is an infection of the lungs. There are different types of pneumonia. One type can develop while a person is in a hospital. A different type, called community-acquired pneumonia, develops in people who are not, or have not recently been, in the hospital or other health care facility.  CAUSES Pneumonia may be caused by bacteria, viruses, or funguses. Community-acquired pneumonia is often caused by Streptococcus pneumonia bacteria. These bacteria are often passed from one person to another by breathing in droplets from the cough or sneeze of an infected person. RISK FACTORS The condition is more likely to develop in:  People who havechronic diseases, such as chronic obstructive pulmonary disease (COPD), asthma, congestive heart failure, cystic fibrosis, diabetes, or kidney disease.  People who haveearly-stage or late-stage HIV.  People who havesickle cell disease.  People who havehad their spleen removed (splenectomy).  People who havepoor Human resources officer.  People who havemedical conditions that increase the risk of breathing in (aspirating) secretions their own mouth and nose.   People who havea weakened immune system (immunocompromised).  People who smoke.  People whotravel to areas where pneumonia-causing germs commonly exist.  People whoare around animal habitats or animals that have pneumonia-causing germs, including birds, bats, rabbits, cats, and farm animals. SYMPTOMS Symptoms of this condition include:  Adry cough.  A wet (productive) cough.  Fever.  Sweating.  Chest pain, especially when breathing deeply or coughing.  Rapid breathing or difficulty breathing.  Shortness of breath.  Shaking chills.  Fatigue.  Muscle aches. DIAGNOSIS Your health care provider will take a medical history and perform a physical exam. You may also have  other tests, including:  Imaging studies of your chest, including X-rays.  Tests to check your blood oxygen level and other blood gases.  Other tests on blood, mucus (sputum), fluid around your lungs (pleural fluid), and urine. If your pneumonia is severe, other tests may be done to identify the specific cause of your illness. TREATMENT The type of treatment that you receive depends on many factors, such as the cause of your pneumonia, the medicines you take, and other medical conditions that you have. For most adults, treatment and recovery from pneumonia may occur at home. In some cases, treatment must happen in a hospital. Treatment may include:  Antibiotic medicines, if the pneumonia was caused by bacteria.  Antiviral medicines, if the pneumonia was caused by a virus.  Medicines that are given by mouth or through an IV tube.  Oxygen.  Respiratory therapy. Although rare, treating severe pneumonia may include:  Mechanical ventilation. This is done if you are not breathing well on your own and you cannot maintain a safe blood oxygen level.  Thoracentesis. This procedureremoves fluid around one lung or both lungs to help you breathe better. HOME CARE INSTRUCTIONS  Take over-the-counter and prescription medicines only as told by your health care provider.  Only takecough medicine if you are losing sleep. Understand that cough medicine can prevent your body's natural ability to remove mucus from your lungs.  If you were prescribed an antibiotic medicine, take it as told by your health care provider. Do not stop taking the antibiotic even if you start to feel better.  Sleep in a semi-upright position at night. Try sleeping in a reclining chair, or place a few pillows under your head.  Do not use tobacco products, including cigarettes, chewing tobacco, and e-cigarettes.  If you need help quitting, ask your health care provider.  Drink enough water to keep your urine clear or pale  yellow. This will help to thin out mucus secretions in your lungs. PREVENTION There are ways that you can decrease your risk of developing community-acquired pneumonia. Consider getting a pneumococcal vaccine if:  You are older than 58 years of age.  You are older than 58 years of age and are undergoing cancer treatment, have chronic lung disease, or have other medical conditions that affect your immune system. Ask your health care provider if this applies to you. There are different types and schedules of pneumococcal vaccines. Ask your health care provider which vaccination option is best for you. You may also prevent community-acquired pneumonia if you take these actions:  Get an influenza vaccine every year. Ask your health care provider which type of influenza vaccine is best for you.  Go to the dentist on a regular basis.  Wash your hands often. Use hand sanitizer if soap and water are not available. SEEK MEDICAL CARE IF:  You have a fever.  You are losing sleep because you cannot control your cough with cough medicine. SEEK IMMEDIATE MEDICAL CARE IF:  You have worsening shortness of breath.  You have increased chest pain.  Your sickness becomes worse, especially if you are an older adult or have a weakened immune system.  You cough up blood.   This information is not intended to replace advice given to you by your health care provider. Make sure you discuss any questions you have with your health care provider.   Document Released: 09/18/2005 Document Revised: 06/09/2015 Document Reviewed: 01/13/2015 Elsevier Interactive Patient Education [employer].

## 2015-07-20 NOTE — Progress Notes (Signed)
   HPI  Patient presents today here for cough, congestion  Patient spends that she's been ill for 11 days with cough, nasal congestion, chest congestion, ear pain bilaterally, and sore throat. She states that she was ill for about 3-4 days and then improved for 2 days, then her symptoms returned with predominant cough, dyspnea, and malaise. That was about 6 days ago. She's tolerating food and fluid easily, she is short of breath and not struggling to breathe and has no increased work of breathing.  She's not taking any medications.  PMH: Smoking status noted ROS: Per HPI  Objective: BP 125/76 mmHg  Pulse 79  Temp(Src) 97.9 F (36.6 C) (Oral)  Ht 4\' 11"  (1.499 m)  Wt 135 lb 12.8 oz (61.598 kg)  BMI 27.41 kg/m2 Gen: NAD, alert, cooperative with exam HEENT: NCAT, TMs erythematous bilaterally but no loss of landmarks, no sinus pain to palpation, oropharynx and nares normal CV: RRR, good S1/S2, no murmur Resp: Nonlabored, coarse breath sounds in left upper lobe and right lower lobe Ext: No edema, warm Neuro: Alert and oriented, No gross deficits  Assessment and plan:  # Community-acquired pneumonia Clinically diagnosed considering duration of illness and lung exam Trea with Levaquin, given 10 day course to cover for sinusitis with prominent frontal sinus pressure and pain Mucinex without decongestion, also Tessalon Perles given    Meds ordered this encounter  Medications  . levofloxacin (LEVAQUIN) 500 MG tablet    Sig: Take 1 tablet (500 mg total) by mouth daily.    Dispense:  10 tablet    Refill:  0  . benzonatate (TESSALON) 100 MG capsule    Sig: Take 1 capsule (100 mg total) by mouth 2 (two) times daily as needed for cough.    Dispense:  30 capsule    Refill:  [place], MD [facility] 07/20/2015, 12:43 PM

## 2015-07-27 ENCOUNTER — Ambulatory Visit: Payer: 59 | Admitting: Nurse Practitioner

## 2015-08-19 ENCOUNTER — Ambulatory Visit: Payer: 59 | Admitting: Nurse Practitioner

## 2015-08-30 ENCOUNTER — Ambulatory Visit: Payer: Medicare Other | Admitting: Nurse Practitioner

## 2015-08-30 ENCOUNTER — Other Ambulatory Visit: Payer: Self-pay | Admitting: Nurse Practitioner

## 2015-09-03 ENCOUNTER — Ambulatory Visit: Payer: Medicare Other | Admitting: Family Medicine

## 2015-09-09 DIAGNOSIS — M47816 Spondylosis without myelopathy or radiculopathy, lumbar region: Secondary | ICD-10-CM | POA: Diagnosis not present

## 2015-09-09 DIAGNOSIS — Z79899 Other long term (current) drug therapy: Secondary | ICD-10-CM | POA: Diagnosis not present

## 2015-09-09 DIAGNOSIS — Z5181 Encounter for therapeutic drug level monitoring: Secondary | ICD-10-CM | POA: Diagnosis not present

## 2015-09-09 DIAGNOSIS — M533 Sacrococcygeal disorders, not elsewhere classified: Secondary | ICD-10-CM | POA: Diagnosis not present

## 2015-09-09 DIAGNOSIS — M961 Postlaminectomy syndrome, not elsewhere classified: Secondary | ICD-10-CM | POA: Diagnosis not present

## 2015-09-09 DIAGNOSIS — G894 Chronic pain syndrome: Secondary | ICD-10-CM | POA: Diagnosis not present

## 2015-09-14 ENCOUNTER — Ambulatory Visit (INDEPENDENT_AMBULATORY_CARE_PROVIDER_SITE_OTHER): Payer: BLUE CROSS/BLUE SHIELD | Admitting: Family

## 2015-09-14 ENCOUNTER — Encounter: Payer: Self-pay | Admitting: Family

## 2015-09-14 VITALS — BP 132/73 | HR 72 | Temp 97.6°F | Ht 59.0 in | Wt 136.2 lb

## 2015-09-14 DIAGNOSIS — J309 Allergic rhinitis, unspecified: Secondary | ICD-10-CM | POA: Diagnosis not present

## 2015-09-14 DIAGNOSIS — J069 Acute upper respiratory infection, unspecified: Secondary | ICD-10-CM | POA: Diagnosis not present

## 2015-09-14 MED ORDER — BENZONATATE 200 MG PO CAPS: 200.0000 mg | ORAL_CAPSULE | Freq: Three times a day (TID) | ORAL | Status: DC | PRN

## 2015-09-14 MED ORDER — CETIRIZINE HCL 10 MG PO TABS: 10.0000 mg | ORAL_TABLET | Freq: Every day | ORAL | Status: DC

## 2015-09-14 MED ORDER — MOMETASONE FUROATE 50 MCG/ACT NA SUSP: 2.0000 | Freq: Every day | NASAL | Status: DC

## 2015-09-14 NOTE — Progress Notes (Signed)
Subjective:    Patient ID: Lori Schroeder, female    DOB: 25-Jan-1956, 59 y.o.   MRN: [id number]  Cough This is a new problem. The current episode started in the past 7 days. The problem has been gradually worsening. The problem occurs every few minutes. The cough is productive of purulent sputum. Associated symptoms include chills, ear pain, headaches, postnasal drip, rhinorrhea and a sore throat. Pertinent negatives include no ear congestion, fever, nasal congestion, shortness of breath or wheezing. The symptoms are aggravated by lying down. Risk factors for lung disease include smoking/tobacco exposure. She has tried rest for the symptoms. The treatment provided no relief. There is no history of asthma or COPD.      Review of Systems  Constitutional: Positive for chills. Negative for fever.  HENT: Positive for ear pain, postnasal drip, rhinorrhea and sore throat.   Eyes: Negative.   Respiratory: Positive for cough. Negative for shortness of breath and wheezing.   Cardiovascular: Negative.  Negative for palpitations.  Gastrointestinal: Negative.   Endocrine: Negative.   Genitourinary: Negative.   Musculoskeletal: Negative.   Neurological: Positive for headaches.  Hematological: Negative.   Psychiatric/Behavioral: Negative.   All other systems reviewed and are negative.      Objective:   Physical Exam  Constitutional: She is oriented to person, place, and time. She appears well-developed and well-nourished. No distress.  HENT:  Head: Normocephalic and atraumatic.  Right Ear: External ear normal.  Left Ear: External ear normal.  Nasal passage erythemas with mild swelling  Oropharynx erythmeas  Eyes: Pupils are equal, round, and reactive to light.  Neck: Normal range of motion. Neck supple. No thyromegaly present.  Cardiovascular: Normal rate, regular rhythm, normal heart sounds and intact distal pulses.   No murmur heard. Pulmonary/Chest: Effort normal and breath sounds normal.  No respiratory distress. She has no wheezes.  Abdominal: Soft. Bowel sounds are normal. She exhibits no distension. There is no tenderness.  Musculoskeletal: Normal range of motion. She exhibits no edema or tenderness.  Neurological: She is alert and oriented to person, place, and time. She has normal reflexes. No cranial nerve deficit.  Skin: Skin is warm and dry.  Psychiatric: She has a normal mood and affect. Her behavior is normal. Judgment and thought content normal.  Vitals reviewed.   BP 132/73 mmHg  Pulse 72  Temp(Src) 97.6 F (36.4 C) (Oral)  Ht 4\' 11"  (1.499 m)  Wt 136 lb 3.2 oz (61.78 kg)  BMI 27.49 kg/m2       Assessment & Plan:  1. Acute upper respiratory infection -- Take meds as prescribed - Use a cool mist humidifier  -Use saline nose sprays frequently -Saline irrigations of the nose can be very helpful if done frequently.  * 4X daily for 1 week*  * Use of a nettie pot can be helpful with this. Follow directions with this* -Force fluids -For any cough or congestion  Use plain Mucinex- regular strength or max strength is fine   * Children- consult with Pharmacist for dosing -For fever or aces or pains- take tylenol or ibuprofen appropriate for age and weight.  * for fevers greater than 101 orally you may alternate ibuprofen and tylenol every  3 hours. -Throat lozenges if help - mometasone (NASONEX) 50 MCG/ACT nasal spray; Place 2 sprays into the nose daily.  Dispense: 17 g; Refill: 12 - cetirizine (ZYRTEC) 10 MG tablet; Take 1 tablet (10 mg total) by mouth daily.  Dispense: 30 tablet; Refill: 11 -  benzonatate (TESSALON) 200 MG capsule; Take 1 capsule (200 mg total) by mouth 3 (three) times daily as needed.  Dispense: 30 capsule; Refill: 1  2. Allergic rhinitis, unspecified allergic rhinitis type -Avoid allergens when possible  - mometasone (NASONEX) 50 MCG/ACT nasal spray; Place 2 sprays into the nose daily.  Dispense: 17 g; Refill: 12 - cetirizine (ZYRTEC) 10  MG tablet; Take 1 tablet (10 mg total) by mouth daily.  Dispense: 30 tablet; Refill: [place], FNP

## 2015-09-14 NOTE — Patient Instructions (Signed)
Upper Respiratory Infection, Adult Most upper respiratory infections (URIs) are a viral infection of the air passages leading to the lungs. A URI affects the nose, throat, and upper air passages. The most common type of URI is nasopharyngitis and is typically referred to as "the common cold." URIs run their course and usually go away on their own. Most of the time, a URI does not require medical attention, but sometimes a bacterial infection in the upper airways can follow a viral infection. This is called a secondary infection. Sinus and middle ear infections are common types of secondary upper respiratory infections. Bacterial pneumonia can also complicate a URI. A URI can worsen asthma and chronic obstructive pulmonary disease (COPD). Sometimes, these complications can require emergency medical care and may be life threatening.  CAUSES Almost all URIs are caused by viruses. A virus is a type of germ and can spread from one person to another.  RISKS FACTORS You may be at risk for a URI if:   You smoke.   You have chronic heart or lung disease.  You have a weakened defense (immune) system.   You are very young or very old.   You have nasal allergies or asthma.  You work in crowded or poorly ventilated areas.  You work in health care facilities or schools. SIGNS AND SYMPTOMS  Symptoms typically develop 2-3 days after you come in contact with a cold virus. Most viral URIs last 7-10 days. However, viral URIs from the influenza virus (flu virus) can last 14-18 days and are typically more severe. Symptoms may include:   Runny or stuffy (congested) nose.   Sneezing.   Cough.   Sore throat.   Headache.   Fatigue.   Fever.   Loss of appetite.   Pain in your forehead, behind your eyes, and over your cheekbones (sinus pain).  Muscle aches.  DIAGNOSIS  Your health care provider may diagnose a URI by:  Physical exam.  Tests to check that your symptoms are not due to  another condition such as:  Strep throat.  Sinusitis.  Pneumonia.  Asthma. TREATMENT  A URI goes away on its own with time. It cannot be cured with medicines, but medicines may be prescribed or recommended to relieve symptoms. Medicines may help:  Reduce your fever.  Reduce your cough.  Relieve nasal congestion. HOME CARE INSTRUCTIONS   Take medicines only as directed by your health care provider.   Gargle warm saltwater or take cough drops to comfort your throat as directed by your health care provider.  Use a warm mist humidifier or inhale steam from a shower to increase air moisture. This may make it easier to breathe.  Drink enough fluid to keep your urine clear or pale yellow.   Eat soups and other clear broths and maintain good nutrition.   Rest as needed.   Return to work when your temperature has returned to normal or as your health care provider advises. You may need to stay home longer to avoid infecting others. You can also use a face mask and careful hand washing to prevent spread of the virus.  Increase the usage of your inhaler if you have asthma.   Do not use any tobacco products, including cigarettes, chewing tobacco, or electronic cigarettes. If you need help quitting, ask your health care provider. PREVENTION  The best way to protect yourself from getting a cold is to practice good hygiene.   Avoid oral or hand contact with people with cold   symptoms.   Wash your hands often if contact occurs.  There is no clear evidence that vitamin C, vitamin E, echinacea, or exercise reduces the chance of developing a cold. However, it is always recommended to get plenty of rest, exercise, and practice good nutrition.  SEEK MEDICAL CARE IF:   You are getting worse rather than better.   Your symptoms are not controlled by medicine.   You have chills.  You have worsening shortness of breath.  You have brown or red mucus.  You have yellow or brown nasal  discharge.  You have pain in your face, especially when you bend forward.  You have a fever.  You have swollen neck glands.  You have pain while swallowing.  You have white areas in the back of your throat. SEEK IMMEDIATE MEDICAL CARE IF:   You have severe or persistent:  Headache.  Ear pain.  Sinus pain.  Chest pain.  You have chronic lung disease and any of the following:  Wheezing.  Prolonged cough.  Coughing up blood.  A change in your usual mucus.  You have a stiff neck.  You have changes in your:  Vision.  Hearing.  Thinking.  Mood. MAKE SURE YOU:   Understand these instructions.  Will watch your condition.  Will get help right away if you are not doing well or get worse.   This information is not intended to replace advice given to you by your health care provider. Make sure you discuss any questions you have with your health care provider.   Document Released: 03/14/2001 Document Revised: 02/02/2015 Document Reviewed: 12/24/2013 Elsevier Interactive Patient Education 2016 [employer]  - Take meds as prescribed - Use a cool mist humidifier  -Use saline nose sprays frequently -Saline irrigations of the nose can be very helpful if done frequently.  * 4X daily for 1 week*  * Use of a nettie pot can be helpful with this. Follow directions with this* -Force fluids -For any cough or congestion  Use plain Mucinex- regular strength or max strength is fine   * Children- consult with Pharmacist for dosing -For fever or aces or pains- take tylenol or ibuprofen appropriate for age and weight.  * for fevers greater than 101 orally you may alternate ibuprofen and tylenol every  3 hours. -Throat lozenges if help   Tayson Schnelle, FNP   

## 2015-10-01 DIAGNOSIS — I10 Essential (primary) hypertension: Secondary | ICD-10-CM | POA: Diagnosis not present

## 2015-10-01 DIAGNOSIS — M961 Postlaminectomy syndrome, not elsewhere classified: Secondary | ICD-10-CM | POA: Diagnosis not present

## 2015-10-01 DIAGNOSIS — G894 Chronic pain syndrome: Secondary | ICD-10-CM | POA: Diagnosis not present

## 2015-10-01 DIAGNOSIS — M47817 Spondylosis without myelopathy or radiculopathy, lumbosacral region: Secondary | ICD-10-CM | POA: Diagnosis not present

## 2015-10-01 DIAGNOSIS — Z981 Arthrodesis status: Secondary | ICD-10-CM | POA: Diagnosis not present

## 2015-10-07 ENCOUNTER — Ambulatory Visit: Payer: Medicare Other | Admitting: Nurse Practitioner

## 2015-10-21 ENCOUNTER — Ambulatory Visit: Payer: Medicare Other | Admitting: Nurse Practitioner

## 2015-10-22 ENCOUNTER — Encounter: Payer: Self-pay | Admitting: Nurse Practitioner

## 2015-10-26 ENCOUNTER — Other Ambulatory Visit: Payer: Self-pay | Admitting: *Deleted

## 2015-10-26 MED ORDER — LISINOPRIL-HYDROCHLOROTHIAZIDE 10-12.5 MG PO TABS: 1.0000 | ORAL_TABLET | Freq: Every day | ORAL | Status: DC

## 2015-10-26 NOTE — Telephone Encounter (Signed)
Last labs 07/2014

## 2015-10-26 NOTE — Telephone Encounter (Signed)
Denied lipitor because need to have blood work- no more refills on lisinopril without being seen

## 2015-10-27 NOTE — Telephone Encounter (Signed)
Patient aware.

## 2015-10-29 ENCOUNTER — Ambulatory Visit (INDEPENDENT_AMBULATORY_CARE_PROVIDER_SITE_OTHER): Payer: BLUE CROSS/BLUE SHIELD | Admitting: Nurse Practitioner

## 2015-10-29 ENCOUNTER — Encounter: Payer: Self-pay | Admitting: Nurse Practitioner

## 2015-10-29 VITALS — BP 113/64 | HR 68 | Temp 97.4°F | Ht 59.0 in | Wt 131.0 lb

## 2015-10-29 DIAGNOSIS — Z1159 Encounter for screening for other viral diseases: Secondary | ICD-10-CM

## 2015-10-29 DIAGNOSIS — K219 Gastro-esophageal reflux disease without esophagitis: Secondary | ICD-10-CM

## 2015-10-29 DIAGNOSIS — Z1212 Encounter for screening for malignant neoplasm of rectum: Secondary | ICD-10-CM

## 2015-10-29 DIAGNOSIS — Z6826 Body mass index (BMI) 26.0-26.9, adult: Secondary | ICD-10-CM | POA: Insufficient documentation

## 2015-10-29 DIAGNOSIS — E785 Hyperlipidemia, unspecified: Secondary | ICD-10-CM | POA: Diagnosis not present

## 2015-10-29 DIAGNOSIS — I10 Essential (primary) hypertension: Secondary | ICD-10-CM | POA: Diagnosis not present

## 2015-10-29 MED ORDER — ATORVASTATIN CALCIUM 10 MG PO TABS: 10.0000 mg | ORAL_TABLET | Freq: Every day | ORAL | Status: DC

## 2015-10-29 MED ORDER — ESOMEPRAZOLE MAGNESIUM 40 MG PO CPDR: 40.0000 mg | DELAYED_RELEASE_CAPSULE | Freq: Two times a day (BID) | ORAL | Status: DC

## 2015-10-29 MED ORDER — LISINOPRIL-HYDROCHLOROTHIAZIDE 10-12.5 MG PO TABS: 1.0000 | ORAL_TABLET | Freq: Every day | ORAL | Status: DC

## 2015-10-29 NOTE — Patient Instructions (Signed)
Bone Health Bones protect organs, store calcium, and anchor muscles. Good health habits, such as eating nutritious foods and exercising regularly, are important for maintaining healthy bones. They can also help to prevent a condition that causes bones to lose density and become weak and brittle (osteoporosis). WHY IS BONE MASS IMPORTANT? Bone mass refers to the amount of bone tissue that you have. The higher your bone mass, the stronger your bones. An important step toward having healthy bones throughout life is to have strong and dense bones during childhood. A young adult who has a high bone mass is more likely to have a high bone mass later in life. Bone mass at its greatest it is called peak bone mass. A large decline in bone mass occurs in older adults. In women, it occurs about the time of menopause. During this time, it is important to practice good health habits, because if more bone is lost than what is replaced, the bones will become less healthy and more likely to break (fracture). If you find that you have a low bone mass, you may be able to prevent osteoporosis or further bone loss by changing your diet and lifestyle. HOW CAN I FIND OUT IF MY BONE MASS IS LOW? Bone mass can be measured with an X-ray test that is called a bone mineral density (BMD) test. This test is recommended for all women who are age 65 or older. It may also be recommended for men who are age 70 or older, or for people who are more likely to develop osteoporosis due to:  Having bones that break easily.  Having a long-term disease that weakens bones, such as kidney disease or rheumatoid arthritis.  Having menopause earlier than normal.  Taking medicine that weakens bones, such as steroids, thyroid hormones, or hormone treatment for breast cancer or prostate cancer.  Smoking.  Drinking three or more alcoholic drinks each day. WHAT ARE THE NUTRITIONAL RECOMMENDATIONS FOR HEALTHY BONES? To have healthy bones, you need  to get enough of the right minerals and vitamins. Most nutrition experts recommend getting these nutrients from the foods that you eat. Nutritional recommendations vary from person to person. Ask your health care provider what is healthy for you. Here are some general guidelines. Calcium Recommendations Calcium is the most important (essential) mineral for bone health. Most people can get enough calcium from their diet, but supplements may be recommended for people who are at risk for osteoporosis. Good sources of calcium include:  Dairy products, such as low-fat or nonfat milk, cheese, and yogurt.  Dark green leafy vegetables, such as bok choy and broccoli.  Calcium-fortified foods, such as orange juice, cereal, bread, soy beverages, and tofu products.  Nuts, such as almonds. Follow these recommended amounts for daily calcium intake:  Children, age 1-3: 700 mg.  Children, age 4-8: 1,000 mg.  Children, age 9-13: 1,300 mg.  Teens, age 14-18: 1,300 mg.  Adults, age 19-50: 1,000 mg.  Adults, age 51-70:  Men: 1,000 mg.  Women: 1,200 mg.  Adults, age 71 or older: 1,200 mg.  Pregnant and breastfeeding females:  Teens: 1,300 mg.  Adults: 1,000 mg. Vitamin D Recommendations Vitamin D is the most essential vitamin for bone health. It helps the body to absorb calcium. Sunlight stimulates the skin to make vitamin D, so be sure to get enough sunlight. If you live in a cold climate or you do not get outside often, your health care provider may recommend that you take vitamin D supplements. Good   sources of vitamin D in your diet include:  Egg yolks.  Saltwater fish.  Milk and cereal fortified with vitamin D. Follow these recommended amounts for daily vitamin D intake:  Children and teens, age 1-18: 600 international units.  Adults, age 50 or younger: 400-800 international units.  Adults, age 51 or older: 800-1,000 international units. Other Nutrients Other nutrients for bone  health include:  Phosphorus. This mineral is found in meat, poultry, dairy foods, nuts, and legumes. The recommended daily intake for adult men and adult women is 700 mg.  Magnesium. This mineral is found in seeds, nuts, dark green vegetables, and legumes. The recommended daily intake for adult men is 400-420 mg. For adult women, it is 310-320 mg.  Vitamin K. This vitamin is found in green leafy vegetables. The recommended daily intake is 120 mg for adult men and 90 mg for adult women. WHAT TYPE OF PHYSICAL ACTIVITY IS BEST FOR BUILDING AND MAINTAINING HEALTHY BONES? Weight-bearing and strength-building activities are important for building and maintaining peak bone mass. Weight-bearing activities cause muscles and bones to work against gravity. Strength-building activities increases muscle strength that supports bones. Weight-bearing and muscle-building activities include:  Walking and hiking.  Jogging and running.  Dancing.  Gym exercises.  Lifting weights.  Tennis and racquetball.  Climbing stairs.  Aerobics. Adults should get at least 30 minutes of moderate physical activity on most days. Children should get at least 60 minutes of moderate physical activity on most days. Ask your health care provide what type of exercise is best for you. WHERE CAN I FIND MORE INFORMATION? For more information, check out the following websites:  National Osteoporosis Foundation: http://nof.org/learn/basics  National Institutes of Health: http://www.niams.nih.gov/Health_Info/Bone/Bone_Health/bone_health_for_life.asp   This information is not intended to replace advice given to you by your health care provider. Make sure you discuss any questions you have with your health care provider.   Document Released: 12/09/2003 Document Revised: 02/02/2015 Document Reviewed: 09/23/2014 Elsevier Interactive Patient Education 2016 Elsevier Inc.  

## 2015-10-29 NOTE — Progress Notes (Signed)
Subjective:    Patient ID: Lori Schroeder, female    DOB: 31-May-1956, 60 y.o.   MRN: [id number]  Patient here today for follow up of chronic medical problems.  Outpatient Encounter Prescriptions as of 10/29/2015  Medication Sig  . atorvastatin (LIPITOR) 10 MG tablet TAKE ONE TABLET BY MOUTH ONE TIME DAILY  . cetirizine (ZYRTEC) 10 MG tablet Take 1 tablet (10 mg total) by mouth daily.  Marland Kitchen esomeprazole (NEXIUM) 40 MG capsule TAKE ONE CAPSULE BY MOUTH TWICE DAILY  . HYDROmorphone (DILAUDID) 4 MG tablet Take 4 mg by mouth 3 (three) times daily as needed.  Marland Kitchen lisinopril-hydrochlorothiazide (PRINZIDE,ZESTORETIC) 10-12.5 MG tablet Take 1 tablet by mouth daily.  . pregabalin (LYRICA) 300 MG capsule Take 450 mg by mouth daily.   Marland Kitchen tiZANidine (ZANAFLEX) 4 MG tablet   . mometasone (NASONEX) 50 MCG/ACT nasal spray Place 2 sprays into the nose daily. (Patient not taking: Reported on 10/29/2015)  . [DISCONTINUED] benzonatate (TESSALON) 200 MG capsule Take 1 capsule (200 mg total) by mouth 3 (three) times daily as needed.   No facility-administered encounter medications on file as of 10/29/2015.     Hypertension This is a chronic problem. The current episode started more than 1 year ago. The problem has been resolved since onset. The problem is controlled. Associated symptoms include neck pain. Pertinent negatives include no blurred vision, chest pain, headaches, palpitations, shortness of breath or sweats. There are no associated agents to hypertension. Risk factors for coronary artery disease include dyslipidemia, family history and post-menopausal state. Past treatments include ACE inhibitors and diuretics. The current treatment provides moderate improvement. Compliance problems include diet and exercise.   Hyperlipidemia This is a chronic problem. The current episode started more than 1 year ago. The problem is controlled. Recent lipid tests were reviewed and are normal. She has no history of diabetes,  hypothyroidism or obesity. Pertinent negatives include no chest pain or shortness of breath. Current antihyperlipidemic treatment includes statins. The current treatment provides moderate improvement of lipids. Compliance problems include adherence to diet and adherence to exercise.  Risk factors for coronary artery disease include dyslipidemia, family history, hypertension and post-menopausal.  GERD On nexium daily- works well to keep symptoms under control  * Has chronic back pain- goes to pain clinic for treatment  Review of Systems  Constitutional: Negative.   Eyes: Negative for blurred vision.  Respiratory: Negative for shortness of breath.   Cardiovascular: Negative for chest pain and palpitations.  Genitourinary: Negative.   Musculoskeletal: Positive for neck pain.  Neurological: Negative for headaches.  Psychiatric/Behavioral: Negative.   All other systems reviewed and are negative.      Objective:   Physical Exam  Constitutional: She is oriented to person, place, and time. She appears well-developed and well-nourished.  Neck: Normal range of motion. Neck supple.  Cardiovascular: Normal rate and normal heart sounds.   Pulmonary/Chest: Effort normal and breath sounds normal.  Neurological: She is alert and oriented to person, place, and time.  Skin: Skin is warm.  Psychiatric: She has a normal mood and affect. Her behavior is normal. Thought content normal.    BP 113/64 mmHg  Pulse 68  Temp(Src) 97.4 F (36.3 C) (Oral)  Ht '4\' 11"'$  (1.499 m)  Wt 131 lb (59.421 kg)  BMI 26.44 kg/m2       Assessment & Plan:  1. BMI 26.0-26.9,adult Discussed diet and exercise for person with BMI >25 Will recheck weight in 3-6 months   2. Essential hypertension Do not  add salt to diet - lisinopril-hydrochlorothiazide (PRINZIDE,ZESTORETIC) 10-12.5 MG tablet; Take 1 tablet by mouth daily.  Dispense: 90 tablet; Refill: 0 - CMP14+EGFR  3. Gastroesophageal reflux disease without  esophagitis Avoid spicy foods Do not eat 2 hours prior to bedtime - esomeprazole (NEXIUM) 40 MG capsule; Take 1 capsule (40 mg total) by mouth 2 (two) times daily.  Dispense: 180 capsule; Refill: 1  4. Hyperlipidemia with target LDL less than 100 Low fat diet - atorvastatin (LIPITOR) 10 MG tablet; Take 1 tablet (10 mg total) by mouth daily.  Dispense: 90 tablet; Refill: 1 - Lipid panel  5. Screening for malignant neoplasm of the rectum - Fecal occult blood, imunochemical; Future  6. Need for hepatitis C screening test - Hepatitis C antibody    Labs pending Health maintenance reviewed Diet and exercise encouraged Continue all meds Follow up  In 3 month   [place], FNP

## 2015-10-30 LAB — CMP14+EGFR
ALBUMIN: 4.3 g/dL (ref 3.5–5.5)
ALK PHOS: 128 IU/L — AB (ref 39–117)
ALT: 20 IU/L (ref 0–32)
AST: 17 IU/L (ref 0–40)
Albumin/Globulin Ratio: 1.7 (ref 1.1–2.5)
BUN/Creatinine Ratio: 12 (ref 9–23)
BUN: 8 mg/dL (ref 6–24)
Bilirubin Total: 0.4 mg/dL (ref 0.0–1.2)
CO2: 24 mmol/L (ref 18–29)
CREATININE: 0.69 mg/dL (ref 0.57–1.00)
Calcium: 9.7 mg/dL (ref 8.7–10.2)
Chloride: 94 mmol/L — ABNORMAL LOW (ref 96–106)
GFR calc Af Amer: 111 mL/min/{1.73_m2} (ref >59)
GFR calc non Af Amer: 96 mL/min/{1.73_m2} (ref >59)
GLUCOSE: 147 mg/dL — AB (ref 65–99)
Globulin, Total: 2.6 g/dL (ref 1.5–4.5)
Potassium: 4.1 mmol/L (ref 3.5–5.2)
Sodium: 136 mmol/L (ref 134–144)
Total Protein: 6.9 g/dL (ref 6.0–8.5)

## 2015-10-30 LAB — HEPATITIS C ANTIBODY: Hep C Virus Ab: <0.1 s/co ratio (ref 0.0–0.9)

## 2015-10-30 LAB — LIPID PANEL
CHOL/HDL RATIO: 2.8 ratio (ref 0.0–4.4)
CHOLESTEROL TOTAL: 148 mg/dL (ref 100–199)
HDL: 53 mg/dL (ref >39)
LDL Calculated: 73 mg/dL (ref 0–99)
TRIGLYCERIDES: 112 mg/dL (ref 0–149)
VLDL Cholesterol Cal: 22 mg/dL (ref 5–40)

## 2015-11-03 ENCOUNTER — Other Ambulatory Visit: Payer: Medicare Other

## 2015-11-03 DIAGNOSIS — Z1212 Encounter for screening for malignant neoplasm of rectum: Secondary | ICD-10-CM | POA: Diagnosis not present

## 2015-11-08 LAB — FECAL OCCULT BLOOD, IMMUNOCHEMICAL: Fecal Occult Bld: NEGATIVE

## 2015-11-26 ENCOUNTER — Ambulatory Visit (INDEPENDENT_AMBULATORY_CARE_PROVIDER_SITE_OTHER): Payer: BLUE CROSS/BLUE SHIELD

## 2015-11-26 ENCOUNTER — Encounter: Payer: Self-pay | Admitting: Nurse Practitioner

## 2015-11-26 ENCOUNTER — Ambulatory Visit (INDEPENDENT_AMBULATORY_CARE_PROVIDER_SITE_OTHER): Payer: BLUE CROSS/BLUE SHIELD | Admitting: Nurse Practitioner

## 2015-11-26 VITALS — BP 110/71 | HR 118 | Temp 101.1°F | Ht 59.0 in | Wt 131.8 lb

## 2015-11-26 DIAGNOSIS — R05 Cough: Secondary | ICD-10-CM

## 2015-11-26 DIAGNOSIS — R059 Cough, unspecified: Secondary | ICD-10-CM

## 2015-11-26 DIAGNOSIS — R509 Fever, unspecified: Secondary | ICD-10-CM | POA: Diagnosis not present

## 2015-11-26 DIAGNOSIS — J111 Influenza due to unidentified influenza virus with other respiratory manifestations: Secondary | ICD-10-CM | POA: Diagnosis not present

## 2015-11-26 LAB — POCT INFLUENZA A/B
INFLUENZA B, POC: NEGATIVE
Influenza A, POC: POSITIVE — AB

## 2015-11-26 MED ORDER — OSELTAMIVIR PHOSPHATE 75 MG PO CAPS: 75.0000 mg | ORAL_CAPSULE | Freq: Two times a day (BID) | ORAL | Status: DC

## 2015-11-26 NOTE — Progress Notes (Signed)
  Subjective:     Lori Schroeder is a 60 y.o. female here for evaluation of a cough. Onset of symptoms was 2 days ago. Symptoms have been gradually worsening since that time. The cough is barky and productive of yellow and green sputum and is aggravated by nothing. Associated symptoms include: change in voice, chills, fever, shortness of breath and sputum production. Patient does not have a history of asthma. Patient does not have a history of environmental allergens. Patient has not traveled recently. Patient does have a history of smoking. Patient has had a previous chest x-ray. Patient has not had a PPD done.  The following portions of the patient's history were reviewed and updated as appropriate: allergies, current medications, past family history, past medical history, past social history, past surgical history and problem list.  Review of Systems Pertinent items are noted in HPI.    Objective:     BP 110/71 mmHg  Pulse 118  Temp(Src) 101.1 F (38.4 C) (Oral)  Ht 4\' 11"  (1.499 m)  Wt 131 lb 12.8 oz (59.784 kg)  BMI 26.61 kg/m2 General appearance: alert, cooperative and mild distress Eyes: conjunctivae/corneas clear. PERRL, EOM's intact. Fundi benign. Ears: normal TM's and external ear canals both ears Nose: clear discharge, moderate congestion, turbinates red, no sinus tenderness Throat: lips, mucosa, and tongue normal; teeth and gums normal Neck: no adenopathy, no carotid bruit, no JVD, supple, symmetrical, trachea midline and thyroid not enlarged, symmetric, no tenderness/mass/nodules Lungs: clear to auscultation bilaterally and dry cough Heart: regular rate and rhythm, S1, S2 normal, no murmur, click, rub or gallop  Results for orders placed or performed in visit on 11/26/15  POCT Influenza A/B  Result Value Ref Range   Influenza A, POC Positive (A) Negative   Influenza B, POC Negative Negative     Assessment:    Influenza    Plan:   1. Take meds as prescribed 2. Use a  cool mist humidifier especially during the winter months and when heat has been humid. 3. Use saline nose sprays frequently 4. Saline irrigations of the nose can be very helpful if done frequently.  * 4X daily for 1 week*  * Use of a nettie pot can be helpful with this. Follow directions with this* 5. Drink plenty of fluids 6. Keep thermostat turn down low 7.For any cough or congestion  Use plain Mucinex- regular strength or max strength is fine   * Children- consult with Pharmacist for dosing 8. For fever or aces or pains- take tylenol or ibuprofen appropriate for age and weight.  * for fevers greater than 101 orally you may alternate ibuprofen and tylenol every  3 hours.   Meds ordered this encounter  Medications  . oseltamivir (TAMIFLU) 75 MG capsule    Sig: Take 1 capsule (75 mg total) by mouth 2 (two) times daily.    Dispense:  10 capsule    Refill:  0    Order Specific Question:  Supervising Provider    Answer:  Chipper Herb Indios, FNP

## 2015-11-26 NOTE — Patient Instructions (Signed)

## 2016-01-18 ENCOUNTER — Ambulatory Visit: Payer: BLUE CROSS/BLUE SHIELD | Attending: Anesthesiology | Admitting: Physical Therapy

## 2016-01-18 DIAGNOSIS — M545 Low back pain, unspecified: Secondary | ICD-10-CM

## 2016-01-18 NOTE — Therapy (Signed)
[facility] [place], [place], [address] Phone: [phone]   Fax:  [phone]  Physical Therapy Evaluation  Patient Details  Name: Lori Schroeder MRN: [id number] Date of Birth: [birth year] Referring Provider: Cleon Gustin MD  Encounter Date: 01/18/2016      PT End of Session - 01/18/16 1252    Visit Number 1   Number of Visits 12   Date for PT Re-Evaluation 03/14/16   PT Start Time 0958   PT Stop Time 1054   PT Time Calculation (min) 56 min   Activity Tolerance Patient tolerated treatment well   Behavior During Therapy [facility] for tasks assessed/performed      Past Medical History  Diagnosis Date  . Hypertension   . Hyperlipemia   . GERD (gastroesophageal reflux disease)   . Family history of malignant neoplasm of gastrointestinal tract   . External hemorrhoids without mention of complication 123XX123    Colonoscopy  . Family history of malignant neoplasm of gastrointestinal tract     Past Surgical History  Procedure Laterality Date  . Back surgery    . Abdominal hysterectomy    . Bladder surgery      Bladder Tact    There were no vitals filed for this visit.       Subjective Assessment - 01/18/16 1440    Subjective They want me to get injections but I have to try therapy first.   Limitations Sitting;Standing   How long can you sit comfortably? 10-15 minutes.   How long can you stand comfortably? 10-15 minues.   Patient Stated Goals Get rid of this pain or at least go longer without pain.   Currently in Pain? Yes   Pain Score 8    Pain Location Back   Pain Descriptors / Indicators Aching;Sharp   Pain Type Chronic pain   Pain Onset More than a month ago   Pain Frequency Constant   Aggravating Factors  Sitting and standing.   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    Referring Provider Cleon Gustin MD   Onset Date/Surgical Date --  Ongoing.   Precautions   Precaution Comments Avoid spinal loading.   Restrictions   Weight Bearing Restrictions No   Balance Screen   Has the patient fallen in the past 6 months No   Has the patient had a decrease in activity level because of a fear of falling?  No   Is the patient reluctant to leave their home because of a fear of falling?  No   Home Environment   Living Environment Private residence   Prior Function   Level of Independence Independent   Posture/Postural Control   Posture/Postural Control Postural limitations   Postural Limitations Rounded Shoulders;Forward head   ROM / Strength   AROM / PROM / Strength AROM;Strength   AROM   Overall AROM Comments Deferred spinal ROM testing due reported instability.   Strength   Overall Strength Comments Right dorsiflexion= 3+/5.   Palpation   Palpation comment Right lower musculature was tender to palpation and very taut to touch.  She is experiencing referred pain into her right buttock.   Special Tests    Special Tests --  LT [facility] DTR= 0+/4+; RT PAT 1+/4+.   Ambulation/Gait   Gait Comments Slow and purposeful and patient appears  to be in obvious pain.                   [facility] Adult PT Treatment/Exercise - 01/18/16 0001    Modalities   Modalities Electrical Stimulation;Moist Heat   Moist Heat Therapy   Number Minutes Moist Heat 15 Minutes   Moist Heat Location --  Low back.   Acupuncturist Location --  Low back.   Electrical Stimulation Action IFC   Electrical Stimulation Parameters 80-150 Hz x 15 minutes at 80-150 Hz 100% scan.                  PT Short Term Goals - 01/18/16 1456    PT SHORT TERM GOAL #1   Title Ind with a HEP.   Time 2   Period Weeks   Status New           PT Long Term Goals - 01/18/16 1456    PT LONG TERM GOAL #1   Title Sit 30 minutes with pain not > 3-4/10.    Time 8   Period Weeks   Status New   PT LONG TERM GOAL #2   Title Stand 20 minutes with pain not > 3-4/10.   Time 8   Period Weeks   Status New   PT LONG TERM GOAL #3   Title Eliminate right buttock pain.   Time 8   Period Weeks   Status New               Plan - 01/18/16 1300    Clinical Impression Statement The patient has a long h/o low back pain and has had multiple lumbar surgeries.  She states her fusion "has shifted" and she is experiencing right low back pain and pain into her right buttock.  Her pain is a 8/10 but she experinces 10/10 at times with prolonged sitting, standing and increased walking.  She has found "nothing" really decreases her pain.     PT Frequency 2x / week   PT Duration 8 weeks   PT Treatment/Interventions ADLs/Self Care Home Management;Cryotherapy;Electrical Stimulation;Moist Heat;Therapeutic exercise;Therapeutic activities;Ultrasound;Patient/family education;Manual techniques   PT Next Visit Plan STW/M to right low back musculature; U/S.  Gentle core exercises without spinal loading.   Consulted and Agree with Plan of Care Patient      Patient will benefit from skilled therapeutic intervention in order to improve the following deficits and impairments:  Pain, Decreased activity tolerance  Visit Diagnosis: Right-sided low back pain without sciatica - Plan: PT plan of care cert/re-cert      G-Codes - [id number]    Functional Assessment Tool Used Clinical judgement.   Functional Limitation Mobility: Walking and moving around   Mobility: Walking and Moving Around Current Status [phone]) At least 60 percent but less than 80 percent impaired, limited or restricted   Mobility: Walking and Moving Around Goal Status [id number]) At least 40 percent but less than 60 percent impaired, limited or restricted       Problem List Patient Active Problem List   Diagnosis Date Noted  . BMI 26.0-26.9,adult 10/29/2015  . Hyperlipidemia with target LDL  less than 100 10/14/2010  . Essential hypertension 10/14/2010  . GERD 10/19/2007    Zarayah Lanting, [place] MPT 01/18/2016, 3:10 PM  [facility] [address] [place], [place], [address] Phone: [phone]   Fax:  [phone]  Name: Lori Schroeder MRN: [id number] Date of Birth: Jan 05, 1956

## 2016-01-26 ENCOUNTER — Encounter: Payer: BLUE CROSS/BLUE SHIELD | Admitting: Physical Therapy

## 2016-01-27 ENCOUNTER — Encounter: Payer: Self-pay | Admitting: Physical Therapy

## 2016-01-27 ENCOUNTER — Ambulatory Visit: Payer: BLUE CROSS/BLUE SHIELD | Admitting: Physical Therapy

## 2016-01-27 DIAGNOSIS — M545 Low back pain, unspecified: Secondary | ICD-10-CM

## 2016-01-27 NOTE — Therapy (Signed)
[facility] [place], [place], [address] Phone: [phone]   Fax:  [phone]  Physical Therapy Treatment  Patient Details  Name: Lori Schroeder MRN: [id number] Date of Birth: 06/06/1956 Referring Provider: Cleon Gustin MD  Encounter Date: 01/27/2016      PT End of Session - 01/27/16 0813    Visit Number 2   Number of Visits 12   Date for PT Re-Evaluation 03/14/16   PT Start Time 0817   PT Stop Time 0901   PT Time Calculation (min) 44 min   Activity Tolerance Patient tolerated treatment well   Behavior During Therapy [facility] for tasks assessed/performed      Past Medical History  Diagnosis Date  . Hypertension   . Hyperlipemia   . GERD (gastroesophageal reflux disease)   . Family history of malignant neoplasm of gastrointestinal tract   . External hemorrhoids without mention of complication 123XX123    Colonoscopy  . Family history of malignant neoplasm of gastrointestinal tract     Past Surgical History  Procedure Laterality Date  . Back surgery    . Abdominal hysterectomy    . Bladder surgery      Bladder Tact    There were no vitals filed for this visit.      Subjective Assessment - 01/27/16 0814    Subjective Did not take pain medication as she had to drive grandkids to school.   Limitations Sitting;Standing   How long can you sit comfortably? 10-15 minutes.   How long can you stand comfortably? 10-15 minues.   Patient Stated Goals Get rid of this pain or at least go longer without pain.   Currently in Pain? 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   Precautions   Precaution Comments Avoid spinal  loading.   Restrictions   Weight Bearing Restrictions No                     [facility] Adult PT Treatment/Exercise - 01/27/16 0001    Modalities   Modalities Electrical Stimulation;Moist Heat;Ultrasound   Moist Heat Therapy   Number Minutes Moist Heat 15 Minutes   Moist Heat Location Lumbar Spine;Other (comment)  in prone per patient request   Acupuncturist Location R low back/ hip   Electrical Stimulation Action IFC   Electrical Stimulation Parameters 80-150 Hz x15 min   Electrical Stimulation Goals Pain   Ultrasound   Ultrasound Location R Glute region   Ultrasound Parameters 1.5 w/cm2, 100%,1 mhz x10 min   Ultrasound Goals Pain   Manual Therapy   Manual Therapy Myofascial release   Myofascial Release MFR/STW to R Glute/ lumbar paraspinals/ QL/ Piriformis to decrease tightness and pain in L sidelying                  PT Short Term Goals - 01/18/16 1456    PT SHORT TERM GOAL #1   Title Ind with a HEP.   Time 2   Period Weeks   Status New  PT Long Term Goals - 01/18/16 1456    PT LONG TERM GOAL #1   Title Sit 30 minutes with pain not > 3-4/10.   Time 8   Period Weeks   Status New   PT LONG TERM GOAL #2   Title Stand 20 minutes with pain not > 3-4/10.   Time 8   Period Weeks   Status New   PT LONG TERM GOAL #3   Title Eliminate right buttock pain.   Time 8   Period Weeks   Status New               Plan - 01/27/16 [id number]    Clinical Impression Statement Patient tolerated today's treatment well although she arrived at clinic with 7/10 R low back pain. Patient requests modalities in prone compared to supine as supine positioning increases her pain. Moderate tightness noted in R Glute region today and minimal increased tightness in R lumbar paraspinals. Patient experienced tenderness and sensitiivty to palpation to R Glute region where the tightness was present per patient report. Normal  modalities response noted following removal of the modalities. Patient experienced 5.5/10 R low back pain upon end of treatment and stated she was returning home and would take her pain medication.   PT Frequency 2x / week   PT Duration 8 weeks   PT Treatment/Interventions ADLs/Self Care Home Management;Cryotherapy;Electrical Stimulation;Moist Heat;Therapeutic exercise;Therapeutic activities;Ultrasound;Patient/family education;Manual techniques   PT Next Visit Plan STW/M to right low back musculature; U/S.  Gentle core exercises without spinal loading.   Consulted and Agree with Plan of Care Patient      Patient will benefit from skilled therapeutic intervention in order to improve the following deficits and impairments:  Pain, Decreased activity tolerance  Visit Diagnosis: Right-sided low back pain without sciatica     Problem List Patient Active Problem List   Diagnosis Date Noted  . BMI 26.0-26.9,adult 10/29/2015  . Hyperlipidemia with target LDL less than 100 10/14/2010  . Essential hypertension 10/14/2010  . GERD 10/19/2007    Wynelle Fanny, PTA 01/27/2016, 9:09 AM  [facility] [address] [place], [place], [address] Phone: [phone]   Fax:  [phone]  Name: Lori Schroeder MRN: [id number] Date of Birth: 1956-07-22

## 2016-01-31 ENCOUNTER — Ambulatory Visit: Payer: BLUE CROSS/BLUE SHIELD | Attending: Anesthesiology | Admitting: Physical Therapy

## 2016-01-31 ENCOUNTER — Encounter: Payer: Self-pay | Admitting: Physical Therapy

## 2016-01-31 DIAGNOSIS — M545 Low back pain, unspecified: Secondary | ICD-10-CM

## 2016-01-31 NOTE — Therapy (Signed)
[facility] [place], [place], [address] Phone: [phone]   Fax:  [phone]  Physical Therapy Treatment  Patient Details  Name: Lori Schroeder MRN: [id number] Date of Birth: November 30, 1955 Referring Provider: Cleon Gustin MD  Encounter Date: 01/31/2016      PT End of Session - 01/31/16 0859    Visit Number 3   Number of Visits 12   Date for PT Re-Evaluation 03/14/16   PT Start Time 0900   PT Stop Time [id number]   PT Time Calculation (min) 47 min      Past Medical History  Diagnosis Date  . Hypertension   . Hyperlipemia   . GERD (gastroesophageal reflux disease)   . Family history of malignant neoplasm of gastrointestinal tract   . External hemorrhoids without mention of complication 123XX123    Colonoscopy  . Family history of malignant neoplasm of gastrointestinal tract     Past Surgical History  Procedure Laterality Date  . Back surgery    . Abdominal hysterectomy    . Bladder surgery      Bladder Tact    There were no vitals filed for this visit.      Subjective Assessment - 01/31/16 0900    Subjective Reports that she is hurting this morning.   Limitations Sitting;Standing   How long can you sit comfortably? 10-15 minutes.   How long can you stand comfortably? 10-15 minues.   Patient Stated Goals Get rid of this pain or at least go longer without pain.   Currently in Pain? 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   Next MD Visit 01/2016   Precautions   Precaution Comments Avoid spinal loading.   Restrictions   Weight Bearing Restrictions No                     [facility] Adult PT  Treatment/Exercise - 01/31/16 0001    Modalities   Modalities Electrical Stimulation;Moist Heat;Ultrasound   Moist Heat Therapy   Number Minutes Moist Heat 15 Minutes   Moist Heat Location Lumbar Spine;Other (comment)  Prone per patient request   Electrical Stimulation   Electrical Stimulation Location R low back/ Glute in prone per patient request   Electrical Stimulation Action IFC   Electrical Stimulation Parameters 80-150 hz x15 min   Electrical Stimulation Goals Pain;Tone   Ultrasound   Ultrasound Location R lumbar paraspinals/ Glute   Ultrasound Parameters 1.2 w/cm2, 100%, 44mhz x10 min   Ultrasound Goals Pain   Manual Therapy   Manual Therapy Myofascial release   Myofascial Release MFR/STW to R Glute/ lumbar paraspinals/ QL/ Piriformis to decrease tightness and pain in L sidelying                  PT Short Term Goals - 01/18/16 1456    PT SHORT TERM GOAL #1   Title Ind with a HEP.   Time 2   Period Weeks   Status New           PT Long Term Goals - 01/18/16 1456    PT LONG TERM GOAL #  1   Title Sit 30 minutes with pain not > 3-4/10.   Time 8   Period Weeks   Status New   PT LONG TERM GOAL #2   Title Stand 20 minutes with pain not > 3-4/10.   Time 8   Period Weeks   Status New   PT LONG TERM GOAL #3   Title Eliminate right buttock pain.   Time 8   Period Weeks   Status New               Plan - 01/31/16 0934    Clinical Impression Statement Patient tolerated today's treatment fairly well as patient reported stinigng with manual therapy today. Continues to present with R lumbar paraspinals tightness in upper lumbar paraspinals and in R Glute. Patient reported that stinging sensation with palpation during manual therapy to R Glute musculature. Normal modalites response noted following removal of the modaliities. Goals remain on-going at this time secondary to pain experienced by patient. Patient experienced stiffness upon rising from prone but  states she has stiffness after every position. Patient experienced 6/10 R lumbar pain following today's treatment.   PT Frequency 2x / week   PT Duration 8 weeks   PT Treatment/Interventions ADLs/Self Care Home Management;Cryotherapy;Electrical Stimulation;Moist Heat;Therapeutic exercise;Therapeutic activities;Ultrasound;Patient/family education;Manual techniques   PT Next Visit Plan STW/M to right low back musculature; U/S.  Gentle core exercises without spinal loading.   Consulted and Agree with Plan of Care Patient      Patient will benefit from skilled therapeutic intervention in order to improve the following deficits and impairments:  Pain, Decreased activity tolerance  Visit Diagnosis: Right-sided low back pain without sciatica     Problem List Patient Active Problem List   Diagnosis Date Noted  . BMI 26.0-26.9,adult 10/29/2015  . Hyperlipidemia with target LDL less than 100 10/14/2010  . Essential hypertension 10/14/2010  . GERD 10/19/2007    Wynelle Fanny, PTA 01/31/2016, 9:53 AM  [facility] [place], [place], [address] Phone: [phone]   Fax:  [phone]  Name: Lori Schroeder MRN: [id number] Date of Birth: 1955/11/07

## 2016-02-01 ENCOUNTER — Ambulatory Visit: Payer: BLUE CROSS/BLUE SHIELD | Admitting: Physical Therapy

## 2016-02-01 ENCOUNTER — Encounter: Payer: Self-pay | Admitting: Physical Therapy

## 2016-02-01 ENCOUNTER — Encounter: Payer: Self-pay | Admitting: Nurse Practitioner

## 2016-02-01 ENCOUNTER — Ambulatory Visit (INDEPENDENT_AMBULATORY_CARE_PROVIDER_SITE_OTHER): Payer: BLUE CROSS/BLUE SHIELD | Admitting: Nurse Practitioner

## 2016-02-01 VITALS — BP 107/66 | HR 68 | Temp 97.2°F | Ht 59.0 in | Wt 130.4 lb

## 2016-02-01 DIAGNOSIS — M545 Low back pain, unspecified: Secondary | ICD-10-CM

## 2016-02-01 DIAGNOSIS — I10 Essential (primary) hypertension: Secondary | ICD-10-CM

## 2016-02-01 DIAGNOSIS — G8929 Other chronic pain: Secondary | ICD-10-CM | POA: Insufficient documentation

## 2016-02-01 DIAGNOSIS — M549 Dorsalgia, unspecified: Secondary | ICD-10-CM | POA: Diagnosis not present

## 2016-02-01 DIAGNOSIS — Z6826 Body mass index (BMI) 26.0-26.9, adult: Secondary | ICD-10-CM | POA: Diagnosis not present

## 2016-02-01 DIAGNOSIS — E785 Hyperlipidemia, unspecified: Secondary | ICD-10-CM | POA: Diagnosis not present

## 2016-02-01 DIAGNOSIS — K219 Gastro-esophageal reflux disease without esophagitis: Secondary | ICD-10-CM

## 2016-02-01 DIAGNOSIS — J301 Allergic rhinitis due to pollen: Secondary | ICD-10-CM | POA: Diagnosis not present

## 2016-02-01 MED ORDER — LISINOPRIL-HYDROCHLOROTHIAZIDE 10-12.5 MG PO TABS: 1.0000 | ORAL_TABLET | Freq: Every day | ORAL | Status: DC

## 2016-02-01 MED ORDER — ESOMEPRAZOLE MAGNESIUM 40 MG PO CPDR: 40.0000 mg | DELAYED_RELEASE_CAPSULE | Freq: Two times a day (BID) | ORAL | Status: DC

## 2016-02-01 MED ORDER — ATORVASTATIN CALCIUM 10 MG PO TABS: 10.0000 mg | ORAL_TABLET | Freq: Every day | ORAL | Status: DC

## 2016-02-01 NOTE — Therapy (Signed)
[facility] [place], [place], [address] Phone: [phone]   Fax:  [phone]  Physical Therapy Treatment  Patient Details  Name: Lori Schroeder MRN: [id number] Date of Birth: 1956-02-12 Referring Provider: Cleon Gustin MD  Encounter Date: 02/01/2016      PT End of Session - 02/01/16 0901    Visit Number 4   Number of Visits 12   Date for PT Re-Evaluation 03/14/16   PT Start Time 0903   PT Stop Time 0948   PT Time Calculation (min) 45 min   Activity Tolerance Patient tolerated treatment well   Behavior During Therapy [facility] for tasks assessed/performed      Past Medical History  Diagnosis Date  . Hypertension   . Hyperlipemia   . GERD (gastroesophageal reflux disease)   . Family history of malignant neoplasm of gastrointestinal tract   . External hemorrhoids without mention of complication 123XX123    Colonoscopy  . Family history of malignant neoplasm of gastrointestinal tract     Past Surgical History  Procedure Laterality Date  . Back surgery    . Abdominal hysterectomy    . Bladder surgery      Bladder Tact    There were no vitals filed for this visit.      Subjective Assessment - 02/01/16 0902    Subjective (p) Reports that she medicated yesterday after treatment as she wasn't going anywhere else. Thinks pain yesterday was from sleeping position. Today her back pain is better today.   Limitations (p) Sitting;Standing   How long can you sit comfortably? (p) 10-15 minutes.   How long can you stand comfortably? (p) 10-15 minues.   Patient Stated Goals (p) Get rid of this pain or at least go longer without pain.   Currently in Pain? (p) Yes   Pain Score (p) 4    Pain Location (p) Back   Pain Orientation (p) Right;Lower   Pain Type (p) Chronic pain   Pain Onset (p) More than a month ago            [facility] PT Assessment - 02/01/16 0001    Assessment   Medical Diagnosis Spondylosis of lumbar  region without myleopathy.   Next MD Visit 01/2016   Precautions   Precaution Comments Avoid spinal loading.   Restrictions   Weight Bearing Restrictions No                     [facility] Adult PT Treatment/Exercise - 02/01/16 0001    Modalities   Modalities Electrical Stimulation;Moist Heat;Ultrasound   Moist Heat Therapy   Number Minutes Moist Heat 15 Minutes   Moist Heat Location Lumbar Spine;Other (comment)  in prone per patient request   Acupuncturist Location R low back/ Glute in prone per patient request   Electrical Stimulation Action IFC   Electrical Stimulation Parameters 80-150 Hzx 15 min   Electrical Stimulation Goals Pain;Tone   Ultrasound   Ultrasound Location R lumbar paraspinals/ Glute   Ultrasound Parameters 1.2 w/cm2, 100%,1 mhz x10 min   Ultrasound Goals Pain   Manual Therapy   Manual Therapy Myofascial release   Myofascial Release MFR/STW to R Glute/ lumbar paraspinals/ QL/ Piriformis to decrease tightness and pain in L sidelying                  PT Short Term Goals - 01/18/16 1456    PT SHORT TERM GOAL #1   Title Ind  with a HEP.   Time 2   Period Weeks   Status New           PT Long Term Goals - 01/18/16 1456    PT LONG TERM GOAL #1   Title Sit 30 minutes with pain not > 3-4/10.   Time 8   Period Weeks   Status New   PT LONG TERM GOAL #2   Title Stand 20 minutes with pain not > 3-4/10.   Time 8   Period Weeks   Status New   PT LONG TERM GOAL #3   Title Eliminate right buttock pain.   Time 8   Period Weeks   Status New               Plan - 02/01/16 [id number]    Clinical Impression Statement Patient tolerated today's treatment well with arrival at clinic with decreased pain. Tightness in R lumbar paraspinals and Glute was decreased overall today. Patient encountered stinging pain into R buttock region with manual therapy in lower lumbar paraspinals just to the R of the incision. No  other stinging or tenderness was noted during manual therapy. Normal modalities response noted following removal of the modalities.   PT Frequency 2x / week   PT Duration 8 weeks   PT Treatment/Interventions ADLs/Self Care Home Management;Cryotherapy;Electrical Stimulation;Moist Heat;Therapeutic exercise;Therapeutic activities;Ultrasound;Patient/family education;Manual techniques   PT Next Visit Plan STW/M to right low back musculature; U/S.  Gentle core exercises without spinal loading.   Consulted and Agree with Plan of Care Patient      Patient will benefit from skilled therapeutic intervention in order to improve the following deficits and impairments:  Pain, Decreased activity tolerance  Visit Diagnosis: Right-sided low back pain without sciatica     Problem List Patient Active Problem List   Diagnosis Date Noted  . BMI 26.0-26.9,adult 10/29/2015  . Hyperlipidemia with target LDL less than 100 10/14/2010  . Essential hypertension 10/14/2010  . GERD 10/19/2007    Lori Schroeder, PTA 02/01/2016, 9:52 AM  [facility] [address] [place], [place], [address] Phone: [phone]   Fax:  [phone]  Name: Lori Schroeder MRN: [id number] Date of Birth: 1956/09/30

## 2016-02-01 NOTE — Patient Instructions (Signed)
Exercising to Stay Healthy Exercising regularly is important. It has many health benefits, such as:  Improving your overall fitness, flexibility, and endurance.  Increasing your bone density.  Helping with weight control.  Decreasing your body fat.  Increasing your muscle strength.  Reducing stress and tension.  Improving your overall health. In order to become healthy and stay healthy, it is recommended that you do moderate-intensity and vigorous-intensity exercise. You can tell that you are exercising at a moderate intensity if you have a higher heart rate and faster breathing, but you are still able to hold a conversation. You can tell that you are exercising at a vigorous intensity if you are breathing much harder and faster and cannot hold a conversation while exercising. HOW OFTEN SHOULD I EXERCISE? Choose an activity that you enjoy and set realistic goals. Your health care provider can help you to make an activity plan that works for you. Exercise regularly as directed by your health care provider. This may include:   Doing resistance training twice each week, such as:  Push-ups.  Sit-ups.  Lifting weights.  Using resistance bands.  Doing a given intensity of exercise for a given amount of time. Choose from these options:  150 minutes of moderate-intensity exercise every week.  75 minutes of vigorous-intensity exercise every week.  A mix of moderate-intensity and vigorous-intensity exercise every week. Children, pregnant women, people who are out of shape, people who are overweight, and older adults may need to consult a health care provider for individual recommendations. If you have any sort of medical condition, be sure to consult your health care provider before starting a new exercise program.  WHAT ARE SOME EXERCISE IDEAS? Some moderate-intensity exercise ideas include:   Walking at a rate of 1 mile in 15  minutes.  Biking.  Hiking.  Golfing.  Dancing. Some vigorous-intensity exercise ideas include:   Walking at a rate of at least 4.5 miles per hour.  Jogging or running at a rate of 5 miles per hour.  Biking at a rate of at least 10 miles per hour.  Lap swimming.  Roller-skating or in-line skating.  Cross-country skiing.  Vigorous competitive sports, such as football, basketball, and soccer.  Jumping rope.  Aerobic dancing. WHAT ARE SOME EVERYDAY ACTIVITIES THAT CAN HELP ME TO GET EXERCISE?  Yard work, such as:  Pushing a lawn mower.  Raking and bagging leaves.  Washing and waxing your car.  Pushing a stroller.  Shoveling snow.  Gardening.  Washing windows or floors. HOW CAN I BE MORE ACTIVE IN MY DAY-TO-DAY ACTIVITIES?  Use the stairs instead of the elevator.  Take a walk during your lunch break.  If you drive, park your car farther away from work or school.  If you take public transportation, get off one stop early and walk the rest of the way.  Make all of your phone calls while standing up and walking around.  Get up, stretch, and walk around every 30 minutes throughout the day. WHAT GUIDELINES SHOULD I FOLLOW WHILE EXERCISING?  Do not exercise so much that you hurt yourself, feel dizzy, or get very short of breath.  Consult your health care provider before starting a new exercise program.  Wear comfortable clothes and shoes with good support.  Drink plenty of water while you exercise to prevent dehydration or heat stroke. Body water is lost during exercise and must be replaced.  Work out until you breathe faster and your heart beats faster.   This information   is not intended to replace advice given to you by your health care provider. Make sure you discuss any questions you have with your health care provider.   Document Released: 10/21/2010 Document Revised: 10/09/2014 Document Reviewed: 02/19/2014 Elsevier Interactive Patient Education  2016 Elsevier Inc.  

## 2016-02-01 NOTE — Progress Notes (Signed)
Subjective:    Patient ID: Lori Schroeder, female    DOB: 07/25/1956, 60 y.o.   MRN: [id number]  Patient here today for follow up of chronic medical problems.  Outpatient Encounter Prescriptions as of 02/01/2016  Medication Sig  . atorvastatin (LIPITOR) 10 MG tablet Take 1 tablet (10 mg total) by mouth daily.  . cetirizine (ZYRTEC) 10 MG tablet Take 1 tablet (10 mg total) by mouth daily.  Marland Kitchen esomeprazole (NEXIUM) 40 MG capsule Take 1 capsule (40 mg total) by mouth 2 (two) times daily.  Marland Kitchen HYDROmorphone (DILAUDID) 4 MG tablet Take 4 mg by mouth 3 (three) times daily as needed.  Marland Kitchen lisinopril-hydrochlorothiazide (PRINZIDE,ZESTORETIC) 10-12.5 MG tablet Take 1 tablet by mouth daily.  . mometasone (NASONEX) 50 MCG/ACT nasal spray Place 2 sprays into the nose daily.  . pregabalin (LYRICA) 300 MG capsule Take 450 mg by mouth daily.   Marland Kitchen tiZANidine (ZANAFLEX) 4 MG tablet     Hypertension This is a chronic problem. The current episode started more than 1 year ago. The problem has been resolved since onset. The problem is controlled. Associated symptoms include neck pain. Pertinent negatives include no blurred vision, chest pain, headaches, palpitations, shortness of breath or sweats. There are no associated agents to hypertension. Risk factors for coronary artery disease include dyslipidemia, family history and post-menopausal state. Past treatments include ACE inhibitors and diuretics. The current treatment provides moderate improvement. Compliance problems include diet and exercise.   Hyperlipidemia This is a chronic problem. The current episode started more than 1 year ago. The problem is controlled. Recent lipid tests were reviewed and are normal. She has no history of diabetes, hypothyroidism or obesity. Pertinent negatives include no chest pain or shortness of breath. Current antihyperlipidemic treatment includes statins. The current treatment provides moderate improvement of lipids. Compliance problems  include adherence to diet and adherence to exercise.  Risk factors for coronary artery disease include dyslipidemia, family history, hypertension and post-menopausal.  GERD On nexium daily- works well to keep symptoms under control Chronic back pain Goes to pain clinic- still has pain daiy between 2-5/10 despite pain medication. Allergic rhinitis Zyrtec and nasonex work well to keep symptoms  Under control   Review of Systems  Constitutional: Negative.   Eyes: Negative for blurred vision.  Respiratory: Negative for shortness of breath.   Cardiovascular: Negative for chest pain and palpitations.  Genitourinary: Negative.   Musculoskeletal: Positive for neck pain.  Neurological: Negative for headaches.  Psychiatric/Behavioral: Negative.   All other systems reviewed and are negative.      Objective:   Physical Exam  Constitutional: She is oriented to person, place, and time. She appears well-developed and well-nourished.  Neck: Normal range of motion. Neck supple.  Cardiovascular: Normal rate and normal heart sounds.   Pulmonary/Chest: Effort normal and breath sounds normal.  Neurological: She is alert and oriented to person, place, and time.  Skin: Skin is warm.  Psychiatric: She has a normal mood and affect. Her behavior is normal. Thought content normal.    BP 107/66 mmHg  Pulse 68  Temp(Src) 97.2 F (36.2 C) (Oral)  Ht '4\' 11"'$  (1.499 m)  Wt 130 lb 6.4 oz (59.149 kg)  BMI 26.32 kg/m2  EKG- Kerry Hough, FNP      Assessment & Plan:  1. Essential hypertension Do not add salt to diet - lisinopril-hydrochlorothiazide (PRINZIDE,ZESTORETIC) 10-12.5 MG tablet; Take 1 tablet by mouth daily.  Dispense: 90 tablet; Refill: 0 - CMP14+EGFR - EKG 12-Lead  2. Gastroesophageal  reflux disease without esophagitis Avoid spicy foods Do not eat 2 hours prior to bedtime - esomeprazole (NEXIUM) 40 MG capsule; Take 1 capsule (40 mg total) by mouth 2 (two) times daily.   Dispense: 180 capsule; Refill: 1  3. Hyperlipidemia with target LDL less than 100 Low fat diet - atorvastatin (LIPITOR) 10 MG tablet; Take 1 tablet (10 mg total) by mouth daily.  Dispense: 90 tablet; Refill: 1 - Lipid panel  4. BMI 26.0-26.9,adult Discussed diet and exercise for person with BMI >25 Will recheck weight in 3-6 months  5. Chronic back pain Continue with pain management and PT  6. Allergic rhinitis due to pollen Continue allergy meds    Labs pending Health maintenance reviewed Diet and exercise encouraged Continue all meds Follow up  In 6 month   [place], FNP

## 2016-02-02 LAB — CMP14+EGFR
ALK PHOS: 114 IU/L (ref 39–117)
ALT: 20 IU/L (ref 0–32)
AST: 19 IU/L (ref 0–40)
Albumin/Globulin Ratio: 1.9 (ref 1.2–2.2)
Albumin: 4.6 g/dL (ref 3.5–5.5)
BUN/Creatinine Ratio: 14 (ref 9–23)
BUN: 8 mg/dL (ref 6–24)
Bilirubin Total: 0.4 mg/dL (ref 0.0–1.2)
CALCIUM: 9.9 mg/dL (ref 8.7–10.2)
CO2: 24 mmol/L (ref 18–29)
CREATININE: 0.59 mg/dL (ref 0.57–1.00)
Chloride: 92 mmol/L — ABNORMAL LOW (ref 96–106)
GFR calc Af Amer: 117 mL/min/{1.73_m2} (ref >59)
GFR, EST NON AFRICAN AMERICAN: 101 mL/min/{1.73_m2} (ref >59)
GLOBULIN, TOTAL: 2.4 g/dL (ref 1.5–4.5)
Glucose: 130 mg/dL — ABNORMAL HIGH (ref 65–99)
Potassium: 4.2 mmol/L (ref 3.5–5.2)
SODIUM: 134 mmol/L (ref 134–144)
TOTAL PROTEIN: 7 g/dL (ref 6.0–8.5)

## 2016-02-02 LAB — LIPID PANEL
CHOLESTEROL TOTAL: 171 mg/dL (ref 100–199)
Chol/HDL Ratio: 3.7 ratio units (ref 0.0–4.4)
HDL: 46 mg/dL (ref >39)
LDL CALC: 95 mg/dL (ref 0–99)
TRIGLYCERIDES: 149 mg/dL (ref 0–149)
VLDL Cholesterol Cal: 30 mg/dL (ref 5–40)

## 2016-02-09 ENCOUNTER — Encounter: Payer: BLUE CROSS/BLUE SHIELD | Admitting: Physical Therapy

## 2016-02-09 DIAGNOSIS — Z79899 Other long term (current) drug therapy: Secondary | ICD-10-CM | POA: Diagnosis not present

## 2016-02-09 DIAGNOSIS — G8929 Other chronic pain: Secondary | ICD-10-CM | POA: Diagnosis not present

## 2016-02-09 DIAGNOSIS — M961 Postlaminectomy syndrome, not elsewhere classified: Secondary | ICD-10-CM | POA: Diagnosis not present

## 2016-02-09 DIAGNOSIS — G894 Chronic pain syndrome: Secondary | ICD-10-CM | POA: Diagnosis not present

## 2016-02-09 DIAGNOSIS — M5442 Lumbago with sciatica, left side: Secondary | ICD-10-CM | POA: Diagnosis not present

## 2016-02-09 DIAGNOSIS — Z5181 Encounter for therapeutic drug level monitoring: Secondary | ICD-10-CM | POA: Diagnosis not present

## 2016-02-10 ENCOUNTER — Ambulatory Visit: Payer: BLUE CROSS/BLUE SHIELD | Admitting: Physical Therapy

## 2016-02-10 ENCOUNTER — Encounter: Payer: Self-pay | Admitting: Physical Therapy

## 2016-02-10 DIAGNOSIS — M545 Low back pain, unspecified: Secondary | ICD-10-CM

## 2016-02-10 NOTE — Therapy (Signed)
[facility] [place], [place], [address] Phone: [phone]   Fax:  [phone]  Physical Therapy Treatment  Patient Details  Name: Lori Schroeder MRN: [id number] Date of Birth: May 15, 1956 Referring Provider: Cleon Gustin MD  Encounter Date: 02/10/2016      PT End of Session - 02/10/16 0902    Visit Number 5   Number of Visits 12   Date for PT Re-Evaluation 03/14/16   PT Start Time 0902   PT Stop Time 0946   PT Time Calculation (min) 44 min   Activity Tolerance Patient tolerated treatment well   Behavior During Therapy [facility] for tasks assessed/performed      Past Medical History  Diagnosis Date  . Hypertension   . Hyperlipemia   . GERD (gastroesophageal reflux disease)   . Family history of malignant neoplasm of gastrointestinal tract   . External hemorrhoids without mention of complication 123XX123    Colonoscopy  . Family history of malignant neoplasm of gastrointestinal tract     Past Surgical History  Procedure Laterality Date  . Back surgery    . Abdominal hysterectomy    . Bladder surgery      Bladder Tact    There were no vitals filed for this visit.      Subjective Assessment - 02/10/16 0901    Subjective Reports that she had a bad day Tuesday and thinks it may come from the weather.   Limitations Sitting;Standing   How long can you sit comfortably? 10-15 minutes.   How long can you stand comfortably? 10-15 minues.   Patient Stated Goals Get rid of this pain or at least go longer without pain.   Currently in Pain? 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   Next MD Visit 01/2016   Precautions   Precaution Comments Avoid spinal loading.   Restrictions   Weight Bearing Restrictions No                     [facility] Adult PT Treatment/Exercise - 02/10/16 0001    Modalities   Modalities Electrical Stimulation;Moist Heat;Ultrasound   Moist Heat Therapy   Number Minutes Moist Heat 15 Minutes   Moist Heat Location Lumbar Spine;Other (comment)  in prone per patient report   Acupuncturist Location R low back/ Glute in prone per patient request   Electrical Stimulation Action IFC   Electrical Stimulation Parameters 80-150 Hz x15 min   Electrical Stimulation Goals Pain;Tone   Ultrasound   Ultrasound Location R lumbar parapsinals/ Glute   Ultrasound Parameters 1.2 w/cm2, 100%,1 mhz x10 min   Ultrasound Goals Pain   Manual Therapy   Manual Therapy Myofascial release   Myofascial Release MFR/STW to R Glute/ lumbar paraspinals/ QL/ Piriformis to decrease tightness and pain in L sidelying                  PT Short Term Goals - 01/18/16 1456    PT SHORT TERM GOAL #1   Title Ind with a HEP.   Time 2  Period Weeks   Status New           PT Long Term Goals - 02/10/16 0950    PT LONG TERM GOAL #1   Title Sit 30 minutes with pain not > 3-4/10.   Time 8   Period Weeks   Status Achieved  If sitting sideways per patient report 02/10/2016   PT LONG TERM GOAL #2   Title Stand 20 minutes with pain not > 3-4/10.   Time 8   Period Weeks   Status On-going   PT LONG TERM GOAL #3   Title Eliminate right buttock pain.   Time 8   Period Weeks   Status On-going               Plan - 02/10/16 0937    Clinical Impression Statement Patient continues to present in clinic with increased R low back pain with tightness along R lumbar paraspinal and into Glute region. Patient reports experiencing stinging sensation predominately in R upper Glute region. Patient reports that disk herniation previously she experienced a  stabbing pain as now it is a stinging sensaiton. Patient had tightness throughout R lumbar paraspinals and into R upper Glute region with fairly good release with manual therapy in L sideling. Patient reported tenderness along distal R lumbar paraspinals and below during manual therapy. Normal modalities response noted following removal of the modalities with stimulation and moist heat in prone per patient request. Patient able to achieve sitting goal as long as she is sitting sidelying with one knee flexed per patient report and that she cannot sit long if both feet have to be on the floor. Patient's standing and LE symptoms goal continues to be on-going at this time. Patient reported experiencing decreased tightness following today's treatment.   PT Frequency 2x / week   PT Duration 8 weeks   PT Treatment/Interventions ADLs/Self Care Home Management;Cryotherapy;Electrical Stimulation;Moist Heat;Therapeutic exercise;Therapeutic activities;Ultrasound;Patient/family education;Manual techniques   PT Next Visit Plan STW/M to right low back musculature; U/S.  Gentle core exercises without spinal loading.   Consulted and Agree with Plan of Care Patient      Patient will benefit from skilled therapeutic intervention in order to improve the following deficits and impairments:  Pain, Decreased activity tolerance  Visit Diagnosis: Right-sided low back pain without sciatica     Problem List Patient Active Problem List   Diagnosis Date Noted  . Chronic back pain 02/01/2016  . Allergic rhinitis due to pollen 02/01/2016  . BMI 26.0-26.9,adult 10/29/2015  . Hyperlipidemia with target LDL less than 100 10/14/2010  . Essential hypertension 10/14/2010  . GERD 10/19/2007    Wynelle Fanny, PTA 02/10/2016, 9:53 AM  [facility] [address] [place], [place], [address] Phone: [phone]   Fax:  [phone]  Name: Lori Schroeder MRN: [id number] Date of  Birth: [birth year]

## 2016-02-14 ENCOUNTER — Ambulatory Visit: Payer: BLUE CROSS/BLUE SHIELD | Admitting: Physical Therapy

## 2016-02-14 ENCOUNTER — Encounter: Payer: Self-pay | Admitting: Physical Therapy

## 2016-02-14 DIAGNOSIS — M545 Low back pain, unspecified: Secondary | ICD-10-CM

## 2016-02-14 NOTE — Therapy (Signed)
[facility] [place], [place], [address] Phone: [phone]   Fax:  [phone]  Physical Therapy Treatment  Patient Details  Name: Ailey Knepper MRN: [id number] Date of Birth: 1956/09/11 Referring Provider: Cleon Gustin MD  Encounter Date: 02/14/2016      PT End of Session - 02/14/16 0902    Visit Number 6   Number of Visits 12   Date for PT Re-Evaluation 03/14/16   PT Start Time 0902   PT Stop Time 0946   PT Time Calculation (min) 44 min   Activity Tolerance Patient tolerated treatment well   Behavior During Therapy [facility] for tasks assessed/performed      Past Medical History  Diagnosis Date  . Hypertension   . Hyperlipemia   . GERD (gastroesophageal reflux disease)   . Family history of malignant neoplasm of gastrointestinal tract   . External hemorrhoids without mention of complication 123XX123    Colonoscopy  . Family history of malignant neoplasm of gastrointestinal tract     Past Surgical History  Procedure Laterality Date  . Back surgery    . Abdominal hysterectomy    . Bladder surgery      Bladder Tact    There were no vitals filed for this visit.      Subjective Assessment - 02/14/16 0901    Subjective Reports that she has not seen much improvement. Reports that she got papers from [facility] that they denied her nerve burn.   Limitations Sitting;Standing   How long can you sit comfortably? 10-15 minutes.   How long can you stand comfortably? 10-15 minues.   Patient Stated Goals Get rid of this pain or at least go longer without pain.   Currently in Pain? Yes   Pain Score 7    Pain Location Back   Pain Orientation Lower   Pain Descriptors / Indicators Other (Comment)  Stinging   Pain Type Chronic pain   Pain Onset More than a month ago            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   Next MD Visit  04/2016   Precautions   Precaution Comments Avoid spinal loading.   Restrictions   Weight Bearing Restrictions No                     [facility] Adult PT Treatment/Exercise - 02/14/16 0001    Modalities   Modalities Electrical Stimulation;Moist Heat;Ultrasound   Moist Heat Therapy   Number Minutes Moist Heat 15 Minutes   Moist Heat Location Lumbar Spine;Other (comment)  in prone per patient request   Acupuncturist Location R low back/ Glute in prone per patient request   Electrical Stimulation Action IFC   Electrical Stimulation Parameters 80-150 Hz x15 min   Electrical Stimulation Goals Pain;Tone   Ultrasound   Ultrasound Location R Glute region   Ultrasound Parameters 1.2 w/cm2, 100%,1 mhz x10 min   Ultrasound Goals Pain   Manual Therapy   Manual Therapy Myofascial release   Myofascial Release MFR/STW to R Glute/ lumbar paraspinals/ QL/ Piriformis to decrease tightness and pain in L sidelying                  PT Short Term Goals - 01/18/16 1456    PT SHORT TERM GOAL #1   Title Ind with a HEP.   Time 2  Period Weeks   Status New           PT Long Term Goals - 02/10/16 0950    PT LONG TERM GOAL #1   Title Sit 30 minutes with pain not > 3-4/10.   Time 8   Period Weeks   Status Achieved  If sitting sideways per patient report 02/10/2016   PT LONG TERM GOAL #2   Title Stand 20 minutes with pain not > 3-4/10.   Time 8   Period Weeks   Status On-going   PT LONG TERM GOAL #3   Title Eliminate right buttock pain.   Time 8   Period Weeks   Status On-going               Plan - 02/14/16 0941    Clinical Impression Statement Patient continues to arrive to clinic with increased R lumbar/ glute pain today with stinging description. Patient reports not seeing much improvement with PT and does that see referring MD until 04/2016 or sooner if needed. Patient very sensitive to palpation just superior to R SI joint  region as patient reported stinging pain would occur down RLE. Patient continues to present with minimal to moderate tightness in R Glute, QL and lumbar paraspinals. Normal modalities response noted following removal of the modalities. No other goals have been achieved at this time secondary to pain experienced by patient.   PT Frequency 2x / week   PT Duration 8 weeks   PT Treatment/Interventions ADLs/Self Care Home Management;Cryotherapy;Electrical Stimulation;Moist Heat;Therapeutic exercise;Therapeutic activities;Ultrasound;Patient/family education;Manual techniques   PT Next Visit Plan STW/M to right low back musculature; U/S.  Gentle core exercises without spinal loading.   Consulted and Agree with Plan of Care Patient      Patient will benefit from skilled therapeutic intervention in order to improve the following deficits and impairments:  Pain, Decreased activity tolerance  Visit Diagnosis: Right-sided low back pain without sciatica     Problem List Patient Active Problem List   Diagnosis Date Noted  . Chronic back pain 02/01/2016  . Allergic rhinitis due to pollen 02/01/2016  . BMI 26.0-26.9,adult 10/29/2015  . Hyperlipidemia with target LDL less than 100 10/14/2010  . Essential hypertension 10/14/2010  . GERD 10/19/2007    Wynelle Fanny, PTA 02/14/2016, 9:52 AM  [facility] [address] [place], [place], [address] Phone: [phone]   Fax:  [phone]  Name: Shaena Tyrell MRN: [id number] Date of Birth: 1955/11/08

## 2016-02-15 ENCOUNTER — Encounter: Payer: BLUE CROSS/BLUE SHIELD | Admitting: Physical Therapy

## 2016-02-18 ENCOUNTER — Encounter: Payer: BLUE CROSS/BLUE SHIELD | Admitting: Physical Therapy

## 2016-02-18 DIAGNOSIS — Z1231 Encounter for screening mammogram for malignant neoplasm of breast: Secondary | ICD-10-CM | POA: Diagnosis not present

## 2016-02-18 DIAGNOSIS — Z6826 Body mass index (BMI) 26.0-26.9, adult: Secondary | ICD-10-CM | POA: Diagnosis not present

## 2016-02-18 DIAGNOSIS — Z01419 Encounter for gynecological examination (general) (routine) without abnormal findings: Secondary | ICD-10-CM | POA: Diagnosis not present

## 2016-02-23 ENCOUNTER — Encounter: Payer: BLUE CROSS/BLUE SHIELD | Admitting: Physical Therapy

## 2016-02-24 ENCOUNTER — Encounter: Payer: BLUE CROSS/BLUE SHIELD | Admitting: Physical Therapy

## 2016-02-29 ENCOUNTER — Ambulatory Visit: Payer: BLUE CROSS/BLUE SHIELD | Admitting: Physical Therapy

## 2016-02-29 DIAGNOSIS — M545 Low back pain, unspecified: Secondary | ICD-10-CM

## 2016-02-29 NOTE — Therapy (Signed)
[facility] [place], [place], [address] Phone: [phone]   Fax:  [phone]  Physical Therapy Treatment  Patient Details  Name: Lori Schroeder MRN: [id number] Date of Birth: December 22, 1955 Referring Provider: Cleon Gustin MD  Encounter Date: 02/29/2016      PT End of Session - 02/29/16 1722    Visit Number 7   Number of Visits 12   Date for PT Re-Evaluation 03/14/16   PT Start Time 0230   PT Stop Time 0317   PT Time Calculation (min) 47 min   Activity Tolerance Patient tolerated treatment well   Behavior During Therapy [facility] for tasks assessed/performed      Past Medical History  Diagnosis Date  . Hypertension   . Hyperlipemia   . GERD (gastroesophageal reflux disease)   . Family history of malignant neoplasm of gastrointestinal tract   . External hemorrhoids without mention of complication 123XX123    Colonoscopy  . Family history of malignant neoplasm of gastrointestinal tract     Past Surgical History  Procedure Laterality Date  . Back surgery    . Abdominal hysterectomy    . Bladder surgery      Bladder Tact    There were no vitals filed for this visit.      Subjective Assessment - 02/29/16 1717    Subjective My pain is a 5 and a half.   Limitations Sitting;Standing   How long can you sit comfortably? 10-15 minutes.   How long can you stand comfortably? 10-15 minues.   Patient Stated Goals Get rid of this pain or at least go longer without pain.   Pain Score 6    Pain Location Back   Pain Orientation Lower   Pain Descriptors / Indicators --  Stinging.   Pain Type Chronic pain                         [facility] Adult PT Treatment/Exercise - 02/29/16 0001    Modalities   Modalities Electrical Stimulation   Moist Heat Therapy   Number Minutes Moist Heat 15 Minutes   Moist Heat Location Lumbar Spine   Electrical Stimulation   Electrical Stimulation Location RT LB/Glut.    Electrical Stimulation Action Left sdly position with folded pillow between knee:  IFC.   Electrical Stimulation Parameters 80-150 Hz. x 15 minutes.   Electrical Stimulation Goals Pain   Ultrasound   Ultrasound Location RT Glut.   Ultrasound Parameters 1.50 W/CM2 x 12 minutes.   Ultrasound Goals Pain   Manual Therapy   Myofascial Release STW/M x 11 minutes.                  PT Short Term Goals - 01/18/16 1456    PT SHORT TERM GOAL #1   Title Ind with a HEP.   Time 2   Period Weeks   Status New           PT Long Term Goals - 02/10/16 0950    PT LONG TERM GOAL #1   Title Sit 30 minutes with pain not > 3-4/10.   Time 8   Period Weeks   Status Achieved  If sitting sideways per patient report 02/10/2016   PT LONG TERM GOAL #2   Title Stand 20 minutes with pain not > 3-4/10.   Time 8   Period Weeks   Status On-going   PT LONG TERM GOAL #3   Title Eliminate right buttock  pain.   Time 8   Period Weeks   Status On-going               Plan - 02/29/16 1723    Clinical Impression Statement Patient continues to have c/o right sided low back pain and radiation into her right buttock.     PT Frequency 2x / week   PT Duration 8 weeks   PT Treatment/Interventions ADLs/Self Care Home Management;Cryotherapy;Electrical Stimulation;Moist Heat;Therapeutic exercise;Therapeutic activities;Ultrasound;Patient/family education;Manual techniques   PT Next Visit Plan STW/M to right low back musculature; U/S.  Gentle core exercises without spinal loading.   Consulted and Agree with Plan of Care Patient      Patient will benefit from skilled therapeutic intervention in order to improve the following deficits and impairments:  Pain, Decreased activity tolerance  Visit Diagnosis: Right-sided low back pain without sciatica     Problem List Patient Active Problem List   Diagnosis Date Noted  . Chronic back pain 02/01/2016  . Allergic rhinitis due to pollen 02/01/2016   . BMI 26.0-26.9,adult 10/29/2015  . Hyperlipidemia with target LDL less than 100 10/14/2010  . Essential hypertension 10/14/2010  . GERD 10/19/2007    APPLE[place], [place] MPT 02/29/2016, 5:28 PM  [facility] [address] [place], [place], [address] Phone: [phone]   Fax:  [phone]  Name: Lori Schroeder MRN: [id number] Date of Birth: [birth year]

## 2016-03-08 ENCOUNTER — Encounter: Payer: Self-pay | Admitting: Physical Therapy

## 2016-03-08 ENCOUNTER — Ambulatory Visit: Payer: BLUE CROSS/BLUE SHIELD | Attending: Anesthesiology | Admitting: Physical Therapy

## 2016-03-08 DIAGNOSIS — M545 Low back pain, unspecified: Secondary | ICD-10-CM

## 2016-03-08 NOTE — Therapy (Signed)
[facility] [place], [place], [address] Phone: [phone]   Fax:  [phone]  Physical Therapy Treatment  Patient Details  Name: Lori Schroeder MRN: [id number] Date of Birth: [birth year] Referring Provider: Cleon Gustin MD  Encounter Date: 03/08/2016      PT End of Session - 03/08/16 1031    Visit Number 8   Number of Visits 12   Date for PT Re-Evaluation 03/14/16   PT Start Time 1033   PT Stop Time 1116   PT Time Calculation (min) 43 min   Activity Tolerance Patient limited by pain   Behavior During Therapy [facility] for tasks assessed/performed      Past Medical History  Diagnosis Date  . Hypertension   . Hyperlipemia   . GERD (gastroesophageal reflux disease)   . Family history of malignant neoplasm of gastrointestinal tract   . External hemorrhoids without mention of complication [id number]    Colonoscopy  . Family history of malignant neoplasm of gastrointestinal tract     Past Surgical History  Procedure Laterality Date  . Back surgery    . Abdominal hysterectomy    . Bladder surgery      Bladder Tact    There were no vitals filed for this visit.      Subjective Assessment - 03/08/16 1125    Subjective "I'm no better than I was prior to therapy."   Limitations Sitting;Standing   How long can you sit comfortably? 10-15 minutes.   How long can you stand comfortably? 10-15 minues.   Patient Stated Goals Get rid of this pain or at least go longer without pain.   Currently in Pain? 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   Next MD Visit 04/2016   Precautions   Precaution Comments Avoid spinal loading.   Restrictions   Weight Bearing Restrictions No                      [facility] Adult PT Treatment/Exercise - 03/08/16 0001    Modalities   Modalities Electrical Stimulation;Moist Heat;Ultrasound   Moist Heat Therapy   Number Minutes Moist Heat 15 Minutes   Moist Heat Location Lumbar Spine   Electrical Stimulation   Electrical Stimulation Location RT LB/Glut.   Electrical Stimulation Action IFC   Electrical Stimulation Parameters 80-150 hz x15 min   Electrical Stimulation Goals Pain   Ultrasound   Ultrasound Location R Glute   Ultrasound Parameters 1.5 w/cm2. 100%,1 mhz x10 min   Ultrasound Goals Pain   Manual Therapy   Manual Therapy Myofascial release   Myofascial Release MFR/STW to R Glute/ lumbar paraspinals to decrease tightness and pain                  PT Short Term Goals - 03/08/16 1101    PT SHORT TERM GOAL #1   Title Ind with a HEP.   Time 2   Period Weeks   Status Not Met           PT Long Term Goals - 03/08/16 1101    PT LONG TERM GOAL #1   Title Sit 30 minutes with pain not >  3-4/10.   Time 8   Period Weeks   Status Achieved  If sitting sideways per patient report 02/10/2016   PT LONG TERM GOAL #2   Title Stand 20 minutes with pain not > 3-4/10.   Time 8   Period Weeks   Status Not Met   PT LONG TERM GOAL #3   Title Eliminate right buttock pain.   Time 8   Period Weeks   Status Not Met               Plan - 03/08/16 1111    Clinical Impression Statement Patient continues to report increased pain in R low back with no improvements reported by patient. Patient continues to present in clinic with increased R Glute and lumbar paraspinals tightness today upon palpation. Patient continues to report "bee sting" sensation with manual therapy and with bringing pants down for manual therapy. Patient remains very sensitive to palpation with manual therapy as well. No other goals were achieved during PT except sitting goal which is achieved only due to sitting positiion. Normal  modalities response noted following removal of the modalities. Patient continues to experience stiffness and discomfort after changing from prone to sitting.    PT Frequency 2x / week   PT Duration 8 weeks   PT Treatment/Interventions ADLs/Self Care Home Management;Cryotherapy;Electrical Stimulation;Moist Heat;Therapeutic exercise;Therapeutic activities;Ultrasound;Patient/family education;Manual techniques   PT Next Visit Plan D/C summary required due to no improvement with PT.   Consulted and Agree with Plan of Care Patient      Patient will benefit from skilled therapeutic intervention in order to improve the following deficits and impairments:  Pain, Decreased activity tolerance  Visit Diagnosis: Right-sided low back pain without sciatica     Problem List Patient Active Problem List   Diagnosis Date Noted  . Chronic back pain 02/01/2016  . Allergic rhinitis due to pollen 02/01/2016  . BMI 26.0-26.9,adult 10/29/2015  . Hyperlipidemia with target LDL less than 100 10/14/2010  . Essential hypertension 10/14/2010  . GERD 10/19/2007   Ahmed Prima, PTA 03/08/2016 11:27 AM  [facility] [place], [place], [address] Phone: [phone]   Fax:  [phone]  Name: Lori Schroeder MRN: [id number] Date of Birth: 06/12/1956

## 2016-03-08 NOTE — Therapy (Signed)
[facility] [place], [place], [address] Phone: [phone]   Fax:  [phone]  Physical Therapy Treatment  Patient Details  Name: Lori Schroeder MRN: [id number] Date of Birth: [birth year] Referring Provider: Cleon Gustin MD  Encounter Date: 03/08/2016      PT End of Session - 03/08/16 1031    Visit Number 8   Number of Visits 12   Date for PT Re-Evaluation 03/14/16   PT Start Time 1033   PT Stop Time 1116   PT Time Calculation (min) 43 min   Activity Tolerance Patient limited by pain   Behavior During Therapy [facility] for tasks assessed/performed      Past Medical History  Diagnosis Date  . Hypertension   . Hyperlipemia   . GERD (gastroesophageal reflux disease)   . Family history of malignant neoplasm of gastrointestinal tract   . External hemorrhoids without mention of complication [id number]    Colonoscopy  . Family history of malignant neoplasm of gastrointestinal tract     Past Surgical History  Procedure Laterality Date  . Back surgery    . Abdominal hysterectomy    . Bladder surgery      Bladder Tact    There were no vitals filed for this visit.      Subjective Assessment - 03/08/16 1125    Subjective "I'm no better than I was prior to therapy."   Limitations Sitting;Standing   How long can you sit comfortably? 10-15 minutes.   How long can you stand comfortably? 10-15 minues.   Patient Stated Goals Get rid of this pain or at least go longer without pain.   Currently in Pain? 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   Next MD Visit 04/2016   Precautions   Precaution Comments Avoid spinal loading.   Restrictions   Weight Bearing Restrictions No                      [facility] Adult PT Treatment/Exercise - 03/08/16 0001    Modalities   Modalities Electrical Stimulation;Moist Heat;Ultrasound   Moist Heat Therapy   Number Minutes Moist Heat 15 Minutes   Moist Heat Location Lumbar Spine   Electrical Stimulation   Electrical Stimulation Location RT LB/Glut.   Electrical Stimulation Action IFC   Electrical Stimulation Parameters 80-150 hz x15 min   Electrical Stimulation Goals Pain   Ultrasound   Ultrasound Location R Glute   Ultrasound Parameters 1.5 w/cm2. 100%,1 mhz x10 min   Ultrasound Goals Pain   Manual Therapy   Manual Therapy Myofascial release   Myofascial Release MFR/STW to R Glute/ lumbar paraspinals to decrease tightness and pain                  PT Short Term Goals - 03/08/16 1101    PT SHORT TERM GOAL #1   Title Ind with a HEP.   Time 2   Period Weeks   Status Not Met           PT Long Term Goals - 03/08/16 1101    PT LONG TERM GOAL #1   Title Sit 30 minutes with pain not >  3-4/10.   Time 8   Period Weeks   Status Achieved  If sitting sideways per patient report 02/10/2016   PT LONG TERM GOAL #2   Title Stand 20 minutes with pain not > 3-4/10.   Time 8   Period Weeks   Status Not Met   PT LONG TERM GOAL #3   Title Eliminate right buttock pain.   Time 8   Period Weeks   Status Not Met               Plan - 03/08/16 1111    Clinical Impression Statement Patient continues to report increased pain in R low back with no improvements reported by patient. Patient continues to present in clinic with increased R Glute and lumbar paraspinals tightness today upon palpation. Patient continues to report "bee sting" sensation with manual therapy and with bringing pants down for manual therapy. Patient remains very sensitive to palpation with manual therapy as well. No other goals were achieved during PT except sitting goal which is achieved only due to sitting positiion. Normal  modalities response noted following removal of the modalities. Patient continues to experience stiffness and discomfort after changing from prone to sitting.    PT Frequency 2x / week   PT Duration 8 weeks   PT Treatment/Interventions ADLs/Self Care Home Management;Cryotherapy;Electrical Stimulation;Moist Heat;Therapeutic exercise;Therapeutic activities;Ultrasound;Patient/family education;Manual techniques   PT Next Visit Plan D/C summary required due to no improvement with PT.   Consulted and Agree with Plan of Care Patient      Patient will benefit from skilled therapeutic intervention in order to improve the following deficits and impairments:  Pain, Decreased activity tolerance  Visit Diagnosis: Right-sided low back pain without sciatica     Problem List Patient Active Problem List   Diagnosis Date Noted  . Chronic back pain 02/01/2016  . Allergic rhinitis due to pollen 02/01/2016  . BMI 26.0-26.9,adult 10/29/2015  . Hyperlipidemia with target LDL less than 100 10/14/2010  . Essential hypertension 10/14/2010  . GERD 10/19/2007   PHYSICAL THERAPY DISCHARGE SUMMARY  Visits from Start of Care: 8  Current functional level related to goals / functional outcomes: Please see above.   Remaining deficits: Patient reports essentially no improvement.   Education / Equipment: HEP.  Plan: Patient agrees to discharge.  Patient goals were not met. Patient is being discharged due to lack of progress.  ?????      APPLE[place], [place] MPT 03/08/2016, 3:13 PM  [facility] [address] [place], [place], [address] Phone: [phone]   Fax:  [phone]  Name: Lori Schroeder MRN: [id number] Date of Birth: April 22, 1956

## 2016-03-09 ENCOUNTER — Encounter: Payer: BLUE CROSS/BLUE SHIELD | Admitting: Physical Therapy

## 2016-04-06 DIAGNOSIS — M545 Low back pain, unspecified: Secondary | ICD-10-CM | POA: Insufficient documentation

## 2016-04-06 DIAGNOSIS — G8929 Other chronic pain: Secondary | ICD-10-CM | POA: Diagnosis not present

## 2016-04-06 DIAGNOSIS — M47816 Spondylosis without myelopathy or radiculopathy, lumbar region: Secondary | ICD-10-CM | POA: Diagnosis not present

## 2016-04-06 DIAGNOSIS — G894 Chronic pain syndrome: Secondary | ICD-10-CM | POA: Diagnosis not present

## 2016-05-26 ENCOUNTER — Encounter: Payer: Self-pay | Admitting: Nurse Practitioner

## 2016-05-26 ENCOUNTER — Ambulatory Visit (INDEPENDENT_AMBULATORY_CARE_PROVIDER_SITE_OTHER): Payer: BLUE CROSS/BLUE SHIELD | Admitting: Nurse Practitioner

## 2016-05-26 VITALS — BP 116/62 | HR 57 | Temp 97.2°F | Ht 59.0 in | Wt 136.0 lb

## 2016-05-26 DIAGNOSIS — R1310 Dysphagia, unspecified: Secondary | ICD-10-CM

## 2016-05-26 NOTE — Progress Notes (Signed)
   Subjective:    Patient ID: Lori Schroeder, female    DOB: 05-03-1956, 60 y.o.   MRN: [id number]  HPI Patient in today c/o " feeling like her throat is closing up ." She said she had neck surgery 2 years ago and had a slight problem with this after surgery but it got better on its own. Occasionally feels like she is choking on food but does not occur daily.    Review of Systems  Constitutional: Negative.   HENT: Negative.   Respiratory: Negative.   Cardiovascular: Negative.   Gastrointestinal: Negative.   Genitourinary: Negative.   Neurological: Negative.   Psychiatric/Behavioral: Negative.   All other systems reviewed and are negative.      Objective:   Physical Exam  Constitutional: She appears well-developed and well-nourished. No distress.  HENT:  Right Ear: External ear normal.  Left Ear: External ear normal.  Nose: Nose normal.  Mouth/Throat: Oropharynx is clear and moist.  Cardiovascular: Normal rate.   Pulmonary/Chest: Effort normal and breath sounds normal.  Abdominal: Soft. Bowel sounds are normal.  Musculoskeletal: Normal range of motion.  Neurological: She is alert.  Skin: Skin is warm.  Psychiatric: She has a normal mood and affect. Her behavior is normal. Judgment and thought content normal.    BP 116/62   Pulse (!) 57   Temp 97.2 F (36.2 C) (Oral)   Ht 4\' 11"  (1.499 m)   Wt 136 lb (61.7 kg)   BMI 27.47 kg/m        Assessment & Plan:   1. Dysphagia    Chew food really good I have already ordered this as a future order in the computer. RTO prn  Mary-Margaret Hassell Done, FNP

## 2016-05-26 NOTE — Patient Instructions (Signed)

## 2016-07-04 ENCOUNTER — Encounter (INDEPENDENT_AMBULATORY_CARE_PROVIDER_SITE_OTHER): Payer: Self-pay

## 2016-07-04 ENCOUNTER — Ambulatory Visit (INDEPENDENT_AMBULATORY_CARE_PROVIDER_SITE_OTHER): Payer: BLUE CROSS/BLUE SHIELD | Admitting: Gastroenterology

## 2016-07-04 VITALS — BP 112/60 | HR 80 | Ht 59.0 in | Wt 136.0 lb

## 2016-07-04 DIAGNOSIS — K219 Gastro-esophageal reflux disease without esophagitis: Secondary | ICD-10-CM | POA: Diagnosis not present

## 2016-07-04 DIAGNOSIS — Z720 Tobacco use: Secondary | ICD-10-CM | POA: Diagnosis not present

## 2016-07-04 DIAGNOSIS — R1314 Dysphagia, pharyngoesophageal phase: Secondary | ICD-10-CM | POA: Diagnosis not present

## 2016-07-04 NOTE — Patient Instructions (Addendum)
You have been scheduled for an endoscopy. Please follow written instructions given to you at your visit today. If you use inhalers (even only as needed), please bring them with you on the day of your procedure. Your physician has requested that you go to www.startemmi.com and enter the access code given to you at your visit today. This web site gives a general overview about your procedure. However, you should still follow specific instructions given to you by our office regarding your preparation for the procedure.  If you are age 23 or older, your body mass index should be between 23-30. Your Body mass index is 27.47 kg/m. If this is out of the aforementioned range listed, please consider follow up with your Primary Care Provider.  If you are age 65 or younger, your body mass index should be between 19-25. Your Body mass index is 27.47 kg/m. If this is out of the aformentioned range listed, please consider follow up with your Primary Care Provider.   Thank you for choosing [facility]  Dr Wilfrid Lund III

## 2016-07-04 NOTE — Progress Notes (Signed)
Lori Schroeder Consult Note:  History: Lori Schroeder 07/04/2016  Referring physician: Chevis Pretty, FNP  Reason for consult/chief complaint: Dysphagia (x 3-4 months solid food dysphagia, no liquids. Pt states she has hx of neck surgery 2 years ago. Pt states that dysphagia resolved 6 months after surgery though.)   Subjective  HPI:  This is a 60 year old woman referred to see Lori Schroeder for progressive dysphagia. She reports that it occurred several years ago when she had a cervical disc surgery. It seemed to resolve about 6 months afterwards, and she was well until maybe 6 months ago. She is having a fairly constant feeling of a lump or a fullness in throat. She bends over it feels like she cannot swallow anything. Her's dysphagia to solids, especially bread and rice. She saw Dr. Velora Heckler and then Dr. Sharlett Iles for many years for GERD. She requires twice daily Nexium to control her pyrosis, and unfortunately continues to smoke. [name] denies odynophagia, nausea, vomiting, early satiety or weight loss.   ROS:  Review of Systems  Constitutional: Negative for appetite change and unexpected weight change.  HENT: Negative for mouth sores and voice change.   Eyes: Negative for pain and redness.  Respiratory: Positive for cough. Negative for shortness of breath.   Cardiovascular: Negative for chest pain and palpitations.  Genitourinary: Negative for dysuria and hematuria.  Musculoskeletal: Negative for arthralgias and myalgias.  Skin: Negative for pallor and rash.  Neurological: Negative for weakness and headaches.  Hematological: Negative for adenopathy.     Past Medical History: Past Medical History:  Diagnosis Date  . External hemorrhoids without mention of complication [id number]   Colonoscopy  . Family history of malignant neoplasm of gastrointestinal tract   . Family history of malignant neoplasm of gastrointestinal tract   . GERD (gastroesophageal reflux disease)   .  Hyperlipemia   . Hypertension      Past Surgical History: Past Surgical History:  Procedure Laterality Date  . ABDOMINAL HYSTERECTOMY    . BACK SURGERY    . BLADDER SURGERY     Bladder Tact     Family History: Family History  Problem Relation Age of Onset  . Colon cancer Maternal Grandmother   She has no parents or siblings with colorectal cancer. Her last colonoscopy in 2008 had no polyps.  Social History: Social History   Social History  . Marital status: Married    Spouse name: [name]  . Number of children: 2  . Years of education: [name]   Occupational History  . disability   .  Disabled   Social History Main Topics  . Smoking status: Current Every Day Smoker    Packs/day: 0.30    Years: 8.00    Types: Cigarettes  . Smokeless tobacco: Never Used  . Alcohol use No  . Drug use: No  . Sexual activity: Not on file   Other Topics Concern  . Not on file   Social History Narrative  . No narrative on file    Allergies: No Known Allergies  Outpatient Meds: Current Outpatient Prescriptions  Medication Sig Dispense Refill  . atorvastatin (LIPITOR) 10 MG tablet Take 1 tablet (10 mg total) by mouth daily. 90 tablet 1  . esomeprazole (NEXIUM) 40 MG capsule Take 1 capsule (40 mg total) by mouth 2 (two) times daily. 180 capsule 1  . HYDROmorphone (DILAUDID) 4 MG tablet Take 4 mg by mouth 3 (three) times daily as needed.    Marland Kitchen lisinopril-hydrochlorothiazide (PRINZIDE,ZESTORETIC) 10-12.5 MG tablet Take 1 tablet  by mouth daily. 90 tablet 0  . pregabalin (LYRICA) 300 MG capsule Take 450 mg by mouth daily.     Marland Kitchen tiZANidine (ZANAFLEX) 4 MG tablet      No current facility-administered medications for this visit.       ___________________________________________________________________ Objective   Exam:  BP 112/60   Pulse 80   Ht 4\' 11"  (1.499 m)   Wt 136 lb (61.7 kg)   BMI 27.47 kg/m    General: this is a(n) Well-appearing middle-aged woman, normal vocal  quality   Eyes: sclera anicteric, no redness  ENT: oral mucosa moist without lesions, no cervical or supraclavicular lymphadenopathy, good dentition and no neck tenderness or fullness  CV: RRR without murmur, S1/S2, no JVD, no peripheral edema  Resp: clear to auscultation bilaterally, normal RR and effort noted  GI: soft, no tenderness, with active bowel sounds. No guarding or palpable organomegaly noted.  Skin; warm and dry, no rash or jaundice noted  Neuro: awake, alert and oriented x 3. Normal gross motor function and fluent speech   Assessment: Encounter Diagnoses  Name Primary?  . Pharyngoesophageal dysphagia Yes  . Gastroesophageal reflux disease without esophagitis   . Tobacco abuse    I suspect this may be GERD related dysmotility of the UES, wraps exacerbated by chronic narcotic pain medicine. Mechanical lesion such as ring or web or extrinsic compression from cervical  hardware is possible. Laryngeal neoplasia must be considered. Naturally, she was strongly advised to quit smoking.  Plan:  Upper endoscopy with possible dilation  The benefits and risks of the planned procedure were described in detail with the patient or (when appropriate) their health care proxy.  Risks were outlined as including, but not limited to, bleeding, infection, perforation, adverse medication reaction leading to cardiac or pulmonary decompensation, or pancreatitis (if ERCP).  The limitation of incomplete mucosal visualization was also discussed.  No guarantees or warranties were given.  She is aware that if I am not able to see her larynx well during the EGD, she will then be referred to ENT for fiberoptic exam.  Thank you for the courtesy of this consult.  Please call me with any questions or concerns.  Nelida Meuse III  CC: [name], FNP

## 2016-07-12 ENCOUNTER — Encounter: Payer: BLUE CROSS/BLUE SHIELD | Admitting: Gastroenterology

## 2016-07-18 ENCOUNTER — Other Ambulatory Visit: Payer: Self-pay

## 2016-07-18 DIAGNOSIS — Z5181 Encounter for therapeutic drug level monitoring: Secondary | ICD-10-CM | POA: Diagnosis not present

## 2016-07-18 DIAGNOSIS — Z79899 Other long term (current) drug therapy: Secondary | ICD-10-CM | POA: Diagnosis not present

## 2016-07-18 DIAGNOSIS — R1310 Dysphagia, unspecified: Secondary | ICD-10-CM

## 2016-07-18 DIAGNOSIS — G894 Chronic pain syndrome: Secondary | ICD-10-CM | POA: Diagnosis not present

## 2016-07-18 DIAGNOSIS — M47816 Spondylosis without myelopathy or radiculopathy, lumbar region: Secondary | ICD-10-CM | POA: Diagnosis not present

## 2016-08-09 ENCOUNTER — Encounter: Payer: Self-pay | Admitting: Gastroenterology

## 2016-08-09 ENCOUNTER — Ambulatory Visit (AMBULATORY_SURGERY_CENTER): Payer: BLUE CROSS/BLUE SHIELD | Admitting: Gastroenterology

## 2016-08-09 VITALS — BP 111/54 | HR 73 | Temp 98.4°F | Resp 16 | Ht 59.0 in | Wt 137.0 lb

## 2016-08-09 DIAGNOSIS — R1310 Dysphagia, unspecified: Secondary | ICD-10-CM | POA: Diagnosis not present

## 2016-08-09 DIAGNOSIS — R1314 Dysphagia, pharyngoesophageal phase: Secondary | ICD-10-CM

## 2016-08-09 DIAGNOSIS — K219 Gastro-esophageal reflux disease without esophagitis: Secondary | ICD-10-CM | POA: Diagnosis not present

## 2016-08-09 DIAGNOSIS — E669 Obesity, unspecified: Secondary | ICD-10-CM | POA: Diagnosis not present

## 2016-08-09 DIAGNOSIS — I10 Essential (primary) hypertension: Secondary | ICD-10-CM | POA: Diagnosis not present

## 2016-08-09 DIAGNOSIS — G894 Chronic pain syndrome: Secondary | ICD-10-CM | POA: Diagnosis not present

## 2016-08-09 MED ORDER — SODIUM CHLORIDE 0.9 % IV SOLN: 500.0000 mL | INTRAVENOUS | Status: DC

## 2016-08-09 NOTE — Progress Notes (Signed)
Report to PACU, RN, vss, BBS= Clear.  

## 2016-08-09 NOTE — Op Note (Signed)
[facility] Patient Name: Lori Schroeder Procedure Date: 08/09/2016 10:03 AM MRN: [id number] Endoscopist: [place]. Loletha Carrow , MD Age: 60 Referring MD:  Date of Birth: [birth year] Gender: Female Account #: [id number] Procedure:                Upper GI endoscopy Indications:              Dysphagia, Globus sensation, GERD Medicines:                Monitored Anesthesia Care Procedure:                Pre-Anesthesia Assessment:                           - Prior to the procedure, a History and Physical                            was performed, and patient medications and                            allergies were reviewed. The patient's tolerance of                            previous anesthesia was also reviewed. The risks                            and benefits of the procedure and the sedation                            options and risks were discussed with the patient.                            All questions were answered, and informed consent                            was obtained. Prior Anticoagulants: The patient has                            taken no previous anticoagulant or antiplatelet                            agents. ASA Grade Assessment: II - A patient with                            mild systemic disease. After reviewing the risks                            and benefits, the patient was deemed in                            satisfactory condition to undergo the procedure.                           After obtaining informed consent, the endoscope was  passed under direct vision. Throughout the                            procedure, the patient's blood pressure, pulse, and                            oxygen saturations were monitored continuously. The                            Model GIF-[id number]) scope was introduced                            through the mouth, and advanced to the second part                            of duodenum. The  upper GI endoscopy was                            accomplished without difficulty. The patient                            tolerated the procedure well. Scope In: Scope Out: Findings:                 The larynx was normal.                           The esophagus was normal.                           Multiple semi-sessile fundic gland polyps with no                            stigmata of recent bleeding were found in the                            gastric fundus and in the gastric body.                           The cardia and gastric fundus were normal on                            retroflexion.                           The examined duodenum was normal. Complications:            No immediate complications. Estimated Blood Loss:     Estimated blood loss: none. Impression:               - Normal larynx.                           - Normal esophagus.                           - Multiple fundic gland polyps. These are benign  and related to chronic PPI use.                           - Normal examined duodenum.                           - No specimens collected.                           This appears to be a motility issue related to GERD                            and possibly chronic use of narcotic analgesic. Recommendation:           - Patient has a contact number available for                            emergencies. The signs and symptoms of potential                            delayed complications were discussed with the                            patient. Return to normal activities tomorrow.                            Written discharge instructions were provided to the                            patient.                           - Resume previous diet.                           - Continue present medications.                           - Perform an esophagram at the next available                            appointment. This is to rule out any contribution                             of the cervical hardware affecting the function of                            the UES.                           - SMOKING CESSATION is essential. Mallie Mussel L. Loletha Carrow, MD 08/09/2016 10:21:26 AM This report has been signed electronically.

## 2016-08-09 NOTE — Patient Instructions (Signed)
Our office will call you with appointment for Esophagram.  Smoking Cessation is essential. Resume current medications. Call us with any questions or concerns. Thank you!  YOU HAD AN ENDOSCOPIC PROCEDURE TODAY AT [facility]:   Refer to the procedure report that was given to you for any specific questions about what was found during the examination.  If the procedure report does not answer your questions, please call your gastroenterologist to clarify.  If you requested that your care partner not be given the details of your procedure findings, then the procedure report has been included in a sealed envelope for you to review at your convenience later.  YOU SHOULD EXPECT: Some feelings of bloating in the abdomen. Passage of more gas than usual.  Walking can help get rid of the air that was put into your GI tract during the procedure and reduce the bloating. If you had a lower endoscopy (such as a colonoscopy or flexible sigmoidoscopy) you may notice spotting of blood in your stool or on the toilet paper. If you underwent a bowel prep for your procedure, you may not have a normal bowel movement for a few days.  Please Note:  You might notice some irritation and congestion in your nose or some drainage.  This is from the oxygen used during your procedure.  There is no need for concern and it should clear up in a day or so.  SYMPTOMS TO REPORT IMMEDIATELY:    Following upper endoscopy (EGD)  Vomiting of blood or coffee ground material  New chest pain or pain under the shoulder blades  Painful or persistently difficult swallowing  New shortness of breath  Fever of 100F or higher  Black, tarry-looking stools  For urgent or emergent issues, a gastroenterologist can be reached at any hour by calling [phone].   DIET:  We do recommend a small meal at first, but then you may proceed to your regular diet.  Drink plenty of fluids but you should avoid alcoholic beverages for 24  hours.  ACTIVITY:  You should plan to take it easy for the rest of today and you should NOT DRIVE or use heavy machinery until tomorrow (because of the sedation medicines used during the test).    FOLLOW UP: Our staff will call the number listed on your records the next business day following your procedure to check on you and address any questions or concerns that you may have regarding the information given to you following your procedure. If we do not reach you, we will leave a message.  However, if you are feeling well and you are not experiencing any problems, there is no need to return our call.  We will assume that you have returned to your regular daily activities without incident.  If any biopsies were taken you will be contacted by phone or by letter within the next 1-3 weeks.  Please call us at [phone] if you have not heard about the biopsies in 3 weeks.    SIGNATURES/CONFIDENTIALITY: You and/or your care partner have signed paperwork which will be entered into your electronic medical record.  These signatures attest to the fact that that the information above on your After Visit Summary has been reviewed and is understood.  Full responsibility of the confidentiality of this discharge information lies with you and/or your care-partner.

## 2016-08-10 ENCOUNTER — Telehealth: Payer: Self-pay

## 2016-08-10 ENCOUNTER — Other Ambulatory Visit: Payer: Self-pay

## 2016-08-10 ENCOUNTER — Other Ambulatory Visit: Payer: Self-pay | Admitting: Gastroenterology

## 2016-08-10 ENCOUNTER — Telehealth: Payer: Self-pay | Admitting: *Deleted

## 2016-08-10 DIAGNOSIS — R1310 Dysphagia, unspecified: Secondary | ICD-10-CM

## 2016-08-10 NOTE — Telephone Encounter (Signed)
Patient advised she is scheduled for an esophagram at [facility] on 11/16 to arrive at 10:45 for an 11:00 test, NPO 6 hours prior.

## 2016-08-10 NOTE — Telephone Encounter (Signed)
  Follow up Call-  Call back number 08/09/2016  Post procedure Call Back phone  # [phone]  Permission to leave phone message No  Some recent data might be hidden     Patient questions:  Do you have a fever, pain , or abdominal swelling? No. Pain Score  0 *  Have you tolerated food without any problems? Yes.    Have you been able to return to your normal activities? Yes.    Do you have any questions about your discharge instructions: Diet   No. Medications  No. Follow up visit  No.  Do you have questions or concerns about your Care? No.  Actions: * If pain score is 4 or above: No action needed, pain <4.

## 2016-08-17 ENCOUNTER — Ambulatory Visit (HOSPITAL_COMMUNITY): Payer: BLUE CROSS/BLUE SHIELD

## 2016-08-29 ENCOUNTER — Ambulatory Visit (HOSPITAL_COMMUNITY)
Admission: RE | Admit: 2016-08-29 | Discharge: 2016-08-29 | Disposition: A | Discharge status: Home / Self Care | Payer: BLUE CROSS/BLUE SHIELD | Source: Ambulatory Visit | Attending: Gastroenterology | Admitting: Gastroenterology

## 2016-08-29 DIAGNOSIS — Z981 Arthrodesis status: Secondary | ICD-10-CM | POA: Diagnosis not present

## 2016-08-29 DIAGNOSIS — R1310 Dysphagia, unspecified: Secondary | ICD-10-CM | POA: Insufficient documentation

## 2016-09-15 ENCOUNTER — Ambulatory Visit (INDEPENDENT_AMBULATORY_CARE_PROVIDER_SITE_OTHER): Payer: BLUE CROSS/BLUE SHIELD | Admitting: Family Medicine

## 2016-09-15 ENCOUNTER — Encounter: Payer: Self-pay | Admitting: Family Medicine

## 2016-09-15 VITALS — BP 111/65 | HR 95 | Temp 98.4°F | Ht 59.0 in | Wt 136.6 lb

## 2016-09-15 DIAGNOSIS — J209 Acute bronchitis, unspecified: Secondary | ICD-10-CM | POA: Diagnosis not present

## 2016-09-15 DIAGNOSIS — Z720 Tobacco use: Secondary | ICD-10-CM | POA: Diagnosis not present

## 2016-09-15 DIAGNOSIS — M79674 Pain in right toe(s): Secondary | ICD-10-CM

## 2016-09-15 DIAGNOSIS — G8929 Other chronic pain: Secondary | ICD-10-CM

## 2016-09-15 DIAGNOSIS — F17200 Nicotine dependence, unspecified, uncomplicated: Secondary | ICD-10-CM | POA: Insufficient documentation

## 2016-09-15 MED ORDER — PREDNISONE 20 MG PO TABS: 40.0000 mg | ORAL_TABLET | Freq: Every day | ORAL | 0 refills | Status: DC

## 2016-09-15 MED ORDER — AZITHROMYCIN 250 MG PO TABS: ORAL_TABLET | ORAL | 0 refills | Status: DC

## 2016-09-15 NOTE — Patient Instructions (Signed)
Great to meet you!  I expect that the prednisone will help your cough and will likely help your toe pain.   I would recommend calling Dr. Irving Shows for an appointment to look at your toe.  A referral has been written   Acute Bronchitis, Adult Acute bronchitis is when air tubes (bronchi) in the lungs suddenly get swollen. The condition can make it hard to breathe. It can also cause these symptoms:  A cough.  Coughing up clear, yellow, or green mucus.  Wheezing.  Chest congestion.  Shortness of breath.  A fever.  Body aches.  Chills.  A sore throat. Follow these instructions at home: Medicines  Take over-the-counter and prescription medicines only as told by your doctor.  If you were prescribed an antibiotic medicine, take it as told by your doctor. Do not stop taking the antibiotic even if you start to feel better. General instructions  Rest.  Drink enough fluids to keep your pee (urine) clear or pale yellow.  Avoid smoking and secondhand smoke. If you smoke and you need help quitting, ask your doctor. Quitting will help your lungs heal faster.  Use an inhaler, cool mist vaporizer, or humidifier as told by your doctor.  Keep all follow-up visits as told by your doctor. This is important. How is this prevented? To lower your risk of getting this condition again:  Wash your hands often with soap and water. If you cannot use soap and water, use hand sanitizer.  Avoid contact with people who have cold symptoms.  Try not to touch your hands to your mouth, nose, or eyes.  Make sure to get the flu shot every year. Contact a doctor if:  Your symptoms do not get better in 2 weeks. Get help right away if:  You cough up blood.  You have chest pain.  You have very bad shortness of breath.  You become dehydrated.  You faint (pass out) or keep feeling like you are going to pass out.  You keep throwing up (vomiting).  You have a very bad headache.  Your fever or  chills gets worse. This information is not intended to replace advice given to you by your health care provider. Make sure you discuss any questions you have with your health care provider. Document Released: 03/06/2008 Document Revised: 04/26/2016 Document Reviewed: 03/08/2016 Elsevier Interactive Patient Education  2017 [employer].

## 2016-09-15 NOTE — Progress Notes (Signed)
   HPI  Patient presents today here with cough and toe pain.  Patient explained she may benefit point about one week ago for toe pain and then developed cough.  Toe pain Has been going on for about one year, states that it's been much worse over the last 3 weeks. Describes right second toe pain that's worse on the lateral edge, no clear injury, no clear ingrown toenail or toenail deformity. She takes intermittent Dilaudid for chronic pain, she does not take this very often. She states that she takes Lyrica regularly as prescribed.  Cough Patient reports just 1-2 days of cough, shortness of breath, intermittent right ear pain, chest tightness Patient states that she smokes about one pack per day, she has a "smoker's cough".  PMH: Smoking status noted ROS: Per HPI  Objective: BP 111/65   Pulse 95   Temp 98.4 F (36.9 C) (Oral)   Ht 4\' 11"  (1.499 m)   Wt 136 lb 9.6 oz (62 kg)   BMI 27.59 kg/m  Gen: NAD, alert, cooperative with exam HEENT: NCAT, oropharynx clear, TMs normal bilaterally, nares clear bilaterally CV: RRR, good S1/S2, no murmur Resp: Nonlabored, clear to auscultation bilaterally Ext: No edema, warm Neuro: Alert and oriented, No gross deficits  MSK:   tenderness to palpation of distal right second toe especially on the lateral surface, slight swelling and erythema not involving the DIP. 2+ dorsalis pedis pulse on the right Brisk cap refill that toe Full range of motion and no clear signs of ingrown toenail or external deformity.   Assessment and plan:  # Right second toe pain Unclear etiology Prednisone given for cough will likely help. Continue Lyrica Recommended follow-up with podiatry for their opinion. No clear toenail deformity  # Acute bronchitis Short duration of symptoms, however with a "smoker's cough" I'm suspicious of underlying lung disease white COPD Visit throat plus prednisone Result likely help toe symptoms as well  # Tobacco  abuse Discussed how to use the patch and quitting hotline Patient is very contemplative   Orders Placed This Encounter  Procedures  . Ambulatory referral to Podiatry    Referral Priority:   Routine    Referral Type:   Consultation    Referral Reason:   Specialty Services Required    Requested Specialty:   Podiatry    Number of Visits Requested:   1    Meds ordered this encounter  Medications  . azithromycin (ZITHROMAX) 250 MG tablet    Sig: Take 2 tablets on day 1 and 1 tablet daily after that    Dispense:  6 tablet    Refill:  0  . predniSONE (DELTASONE) 20 MG tablet    Sig: Take 2 tablets (40 mg total) by mouth daily with breakfast.    Dispense:  10 tablet    Refill:  0    Laroy Apple, MD [facility] 09/15/2016, 4:44 PM

## 2016-09-21 ENCOUNTER — Other Ambulatory Visit: Payer: Self-pay | Admitting: Nurse Practitioner

## 2016-09-21 DIAGNOSIS — I10 Essential (primary) hypertension: Secondary | ICD-10-CM

## 2016-10-05 DIAGNOSIS — M47816 Spondylosis without myelopathy or radiculopathy, lumbar region: Secondary | ICD-10-CM | POA: Diagnosis not present

## 2016-10-05 DIAGNOSIS — G894 Chronic pain syndrome: Secondary | ICD-10-CM | POA: Diagnosis not present

## 2016-10-27 ENCOUNTER — Ambulatory Visit (INDEPENDENT_AMBULATORY_CARE_PROVIDER_SITE_OTHER): Payer: BLUE CROSS/BLUE SHIELD | Admitting: Nurse Practitioner

## 2016-10-27 ENCOUNTER — Encounter: Payer: Self-pay | Admitting: Nurse Practitioner

## 2016-10-27 VITALS — BP 139/77 | HR 72 | Temp 97.4°F | Ht 59.0 in | Wt 132.0 lb

## 2016-10-27 DIAGNOSIS — R200 Anesthesia of skin: Secondary | ICD-10-CM

## 2016-10-27 DIAGNOSIS — B078 Other viral warts: Secondary | ICD-10-CM | POA: Diagnosis not present

## 2016-10-27 DIAGNOSIS — R202 Paresthesia of skin: Secondary | ICD-10-CM

## 2016-10-27 NOTE — Patient Instructions (Signed)
Carpal Tunnel Syndrome Introduction Carpal tunnel syndrome is a condition that causes pain in your hand and arm. The carpal tunnel is a narrow area that is on the palm side of your wrist. Repeated wrist motion or certain diseases may cause swelling in the tunnel. This swelling can pinch the main nerve in the wrist (median nerve). Follow these instructions at home: If you have a splint:  Wear it as told by your doctor. Remove it only as told by your doctor.  Loosen the splint if your fingers:  Become numb and tingle.  Turn blue and cold.  Keep the splint clean and dry. General instructions  Take over-the-counter and prescription medicines only as told by your doctor.  Rest your wrist from any activity that may be causing your pain. If needed, talk to your employer about changes that can be made in your work, such as getting a wrist pad to use while typing.  If directed, apply ice to the painful area:  Put ice in a plastic bag.  Place a towel between your skin and the bag.  Leave the ice on for 20 minutes, 2-3 times per day.  Keep all follow-up visits as told by your doctor. This is important.  Do any exercises as told by your doctor, physical therapist, or occupational therapist. Contact a doctor if:  You have new symptoms.  Medicine does not help your pain.  Your symptoms get worse. This information is not intended to replace advice given to you by your health care provider. Make sure you discuss any questions you have with your health care provider. Document Released: 09/07/2011 Document Revised: 02/24/2016 Document Reviewed: 02/03/2015  2017 Elsevier

## 2016-10-27 NOTE — Progress Notes (Signed)
   Subjective:    Patient ID: Lori Schroeder, female    DOB: [birth year], 61 y.o.   MRN: [id number]  HPI  Patient come sin c/o of multiple warts on both hands and she has been doing OTC freezing and duct tape- no help. SHe has also had some tingling and numbness of index finger, middle finger and part of ring finger on right and sometimes on left- she had carpal tunnel surgery in past on left hand.    Review of Systems  Constitutional: Negative.   HENT: Negative.   Respiratory: Negative.   Cardiovascular: Negative.   Neurological: Negative.   Psychiatric/Behavioral: Negative.   All other systems reviewed and are negative.      Objective:   Physical Exam  Constitutional: She appears well-developed and well-nourished. No distress.  Cardiovascular: Normal rate, regular rhythm and normal heart sounds.   Pulmonary/Chest: Effort normal and breath sounds normal.  Musculoskeletal:  Positive phalen test right hand Negative tinel bil  Skin:  Multiple flesh colored raised skin lesion scattered on bil hands   BP 139/77   Pulse 72   Temp 97.4 F (36.3 C) (Oral)   Ht 4\' 11"  (1.499 m)   Wt 132 lb (59.9 kg)   BMI 26.66 kg/m         Assessment & Plan:  1. Verruca palmaris Stop home remedies for now - Ambulatory referral to Dermatology  2. Numbness and tingling in both hands motrin OTC as needed - Nerve conduction test; Future  Lori Schroeder Done, FNP

## 2016-11-16 ENCOUNTER — Other Ambulatory Visit: Payer: Self-pay

## 2016-11-16 DIAGNOSIS — R200 Anesthesia of skin: Secondary | ICD-10-CM

## 2016-11-16 NOTE — Progress Notes (Unsigned)
neur

## 2016-11-29 DIAGNOSIS — G894 Chronic pain syndrome: Secondary | ICD-10-CM | POA: Diagnosis not present

## 2016-11-29 DIAGNOSIS — Z79899 Other long term (current) drug therapy: Secondary | ICD-10-CM | POA: Diagnosis not present

## 2016-11-29 DIAGNOSIS — Z5181 Encounter for therapeutic drug level monitoring: Secondary | ICD-10-CM | POA: Diagnosis not present

## 2016-11-29 DIAGNOSIS — M961 Postlaminectomy syndrome, not elsewhere classified: Secondary | ICD-10-CM | POA: Diagnosis not present

## 2016-11-29 DIAGNOSIS — M47816 Spondylosis without myelopathy or radiculopathy, lumbar region: Secondary | ICD-10-CM | POA: Diagnosis not present

## 2016-11-29 DIAGNOSIS — M545 Low back pain: Secondary | ICD-10-CM | POA: Diagnosis not present

## 2016-12-08 ENCOUNTER — Telehealth: Payer: Self-pay | Admitting: Nurse Practitioner

## 2016-12-08 DIAGNOSIS — B079 Viral wart, unspecified: Secondary | ICD-10-CM | POA: Diagnosis not present

## 2016-12-13 NOTE — Telephone Encounter (Signed)
No it is fine to take both

## 2016-12-13 NOTE — Telephone Encounter (Signed)
Patient states she went to [facility] Friday and they wanted to start her on Cimetdine 800mg  BID but wanted her to talk with PCP first. Wants to know if it will interfer with Nexium or if she can go off the Nexium? Please advise and send back to the pools.

## 2016-12-13 NOTE — Telephone Encounter (Signed)
Patient aware and verbalizes understanding. 

## 2016-12-19 ENCOUNTER — Ambulatory Visit (INDEPENDENT_AMBULATORY_CARE_PROVIDER_SITE_OTHER): Payer: BLUE CROSS/BLUE SHIELD

## 2016-12-19 ENCOUNTER — Encounter: Payer: Self-pay | Admitting: Family Medicine

## 2016-12-19 ENCOUNTER — Ambulatory Visit (INDEPENDENT_AMBULATORY_CARE_PROVIDER_SITE_OTHER): Payer: BLUE CROSS/BLUE SHIELD | Admitting: Family Medicine

## 2016-12-19 ENCOUNTER — Encounter (INDEPENDENT_AMBULATORY_CARE_PROVIDER_SITE_OTHER): Payer: Self-pay

## 2016-12-19 VITALS — BP 105/61 | HR 97 | Temp 98.4°F | Ht 59.0 in | Wt 131.0 lb

## 2016-12-19 DIAGNOSIS — M79641 Pain in right hand: Secondary | ICD-10-CM

## 2016-12-19 NOTE — Progress Notes (Signed)
   HPI  Patient presents today here with right hand pain.  Patient explains that she fell for word on an outstretched right hand about 2-1/2-3 months ago. Patient states that she's been having tenderness across the middle of her hand throughout this time, however over the last 3 weeks after her last PCP visit, she has developed worsening right hand and wrist pain.  She states that she has difficulty getting dressed or using that hand for cooking.  Patient would like an x-ray.  She has a nerve conduction study on April 6.  She uses dilaudid for chronic back pain  PMH: Smoking status noted ROS: Per HPI  Objective: BP 105/61   Pulse 97   Temp 98.4 F (36.9 C) (Oral)   Ht 4\' 11"  (1.499 m)   Wt 131 lb (59.4 kg)   BMI 26.46 kg/m  Gen: NAD, alert, cooperative with exam HEENT: NCAT CV: RRR, good S1/S2, no murmur Resp: CTABL, no wheezes, non-labored Ext: No edema, warm Neuro: Alert and oriented MSK:   TTP of  the anatomic snuffbox on the right, tenderness across the fifth metacarpal  Assessment and plan:  # Right hand pain Assistant after a fall 3 months ago. Plain film today per her request. She does have tenderness in the anatomic snuffbox, this could be an scaphoid fracture offered ortho Referral, she declines this and states she would like to wait until after her nerve conduction study  Plain film- na acute findings, awaiting radiology read   Orders Placed This Encounter  Procedures  . DG Hand Complete Right    Standing Status:   Future    Number of Occurrences:   1    Standing Expiration Date:   02/18/2018    Order Specific Question:   Reason for Exam (SYMPTOM  OR DIAGNOSIS REQUIRED)    Answer:   fall    Order Specific Question:   Is the patient pregnant?    Answer:   No    Order Specific Question:   Preferred imaging location?    Answer:   Internal    Meds ordered this encounter  Medications  . cimetidine (TAGAMET) 200 MG tablet    Sig: Take 200 mg by  mouth 4 (four) times daily.    Refill:  [place], MD [facility] 12/19/2016, 4:04 PM

## 2016-12-27 ENCOUNTER — Telehealth: Payer: Self-pay | Admitting: Nurse Practitioner

## 2016-12-29 ENCOUNTER — Other Ambulatory Visit: Payer: Self-pay | Admitting: Nurse Practitioner

## 2016-12-29 DIAGNOSIS — E785 Hyperlipidemia, unspecified: Secondary | ICD-10-CM

## 2016-12-29 MED ORDER — ATORVASTATIN CALCIUM 10 MG PO TABS: 10.0000 mg | ORAL_TABLET | Freq: Every day | ORAL | 1 refills | Status: DC

## 2017-01-05 ENCOUNTER — Encounter (INDEPENDENT_AMBULATORY_CARE_PROVIDER_SITE_OTHER): Payer: Self-pay | Admitting: Neurology

## 2017-01-05 ENCOUNTER — Ambulatory Visit (INDEPENDENT_AMBULATORY_CARE_PROVIDER_SITE_OTHER): Payer: BLUE CROSS/BLUE SHIELD | Admitting: Neurology

## 2017-01-05 ENCOUNTER — Encounter: Payer: BLUE CROSS/BLUE SHIELD | Admitting: Neurology

## 2017-01-05 DIAGNOSIS — G5601 Carpal tunnel syndrome, right upper limb: Secondary | ICD-10-CM | POA: Diagnosis not present

## 2017-01-05 DIAGNOSIS — G5600 Carpal tunnel syndrome, unspecified upper limb: Secondary | ICD-10-CM | POA: Insufficient documentation

## 2017-01-05 DIAGNOSIS — Z0289 Encounter for other administrative examinations: Secondary | ICD-10-CM

## 2017-01-05 NOTE — Procedures (Signed)
Full Name: Lori Schroeder Gender: Female MRN #: [id number] Date of Birth: 1955-11-17    Visit Date: 01/05/2017 12:02 Age: 61 Years 33 Months Old Examining Physician: Marcial Pacas, MD  Referring Physician: Chevis Pretty, FNP History: 61 year old right-handed female presented with increased right wrist pain, right finger paresthesia since she fell and braced with her right hand in October 2017. She had a history of cervical decompression surgery, left carpal tunnel release surgery.  On examination: Mild weakness of right abductor pollicis brevis, opponens, decreased pinprick at right first the 3 fingerpads, Right wrist to Tinel sign.  Summary of the test:   Nerve conduction study:  Right median sensory responses showed moderately prolonged peak latency, with mildly decreased snap amplitude.  Right median motor responses showed moderately prolonged distal latency, with well-preserved C map amplitude, conduction velocity.   Right ulnar sensory motor, left median sensorimotor, left median mixed responses were within normal limits.   Electromyography:  Selected needle examination was performed at right upper extremity muscles right cervical paraspinal muscles.  The only abnormality is decreased recruitment at right abductor pollicis brevis, there is no evidence of active denervation.   Conclusion: This is an abnormal study. There is electrodiagnostic evidence of right median neuropathy across the wrist, consistent with moderate right carpal tunnel syndromes. There is no evidence of right cervical radiculopathy.    ------------------------------- Marcial Pacas, M.D.  [facility] [place], [place] [address] Tel: [phone] Fax: [phone]        [facility]    Nerve / Sites Rec. Site Latency Ref. Amplitude Ref. Rel Amp Segments Distance Velocity Ref. Area    ms ms mV mV %  cm m/s m/s mVms  R Median - APB     Wrist APB 6.1 ?4.4 7.5 ?4.0 100 Wrist -  APB 7   29.8     Upper arm APB 10.2  7.2  96.3 Upper arm - Wrist 21 51 ?49 28.9  L Median - APB     Wrist APB 3.8 ?4.4 9.2 ?4.0 100 Wrist - APB 7   32.5     Upper arm APB 7.6  9.4  102 Upper arm - Wrist 20 53 ?49 31.7  R Ulnar - ADM     Wrist ADM 2.8 ?3.3 10.6 ?6.0 100 Wrist - ADM 7   32.6     B.Elbow ADM 5.9  9.9  93.1 B.Elbow - Wrist 17 55 ?49 31.0     A.Elbow ADM 7.5  9.7  98.2 A.Elbow - B.Elbow 9 56 ?49 29.9         A.Elbow - Wrist               SNC    Nerve / Sites Rec. Site Peak Lat Ref.  Amp Ref. Segments Distance Peak Diff Ref.    ms ms V V  cm ms ms  R Median, Ulnar - Transcarpal comparison     Median Palm Wrist 3.70 ?2.20 11 ?35 Median Palm - Wrist 8       Ulnar Palm Wrist 2.03 ?2.20 12 ?12 Ulnar Palm - Wrist 8          Median Palm - Ulnar Palm  1.7 ?0.4  L Median, Ulnar - Transcarpal comparison     Median Palm Wrist 2.24 ?2.20 51 ?35 Median Palm - Wrist 8       Ulnar Palm Wrist 2.08 ?2.20 20 ?12 Ulnar Palm - Wrist 8  Median Palm - Ulnar Palm  0.2 ?0.4  R Median - Orthodromic (Dig II, Mid palm)     Dig II Wrist 4.69 ?3.40 5 ?10 Dig II - Wrist 13    L Median - Orthodromic (Dig II, Mid palm)     Dig II Wrist 3.39 ?3.40 17 ?10 Dig II - Wrist 13    R Ulnar - Orthodromic, (Dig V, Mid palm)     Dig V Wrist 2.81 ?3.10 8 ?5 Dig V - Wrist 11    L Ulnar - Orthodromic, (Dig V, Mid palm)     Dig V Wrist 2.76 ?3.10 5 ?5 Dig V - Wrist 55                   F  Wave    Nerve F Lat Ref.   ms ms  R Ulnar - ADM 26.6 ?32.0       EMG full       EMG Summary Table    Spontaneous MUAP Recruitment  Muscle IA Fib PSW Fasc Other Amp Dur. Poly Pattern  R. First dorsal interosseous Normal None None None _______ Normal Normal Normal Normal  R. Abductor pollicis brevis Increased None None None _______ Increased Normal Normal Reduced  R. Pronator teres Normal None None None _______ Normal Normal Normal Normal  R. Biceps brachii Normal None None None _______ Normal Normal Normal  Normal  R. Deltoid Normal None None None _______ Normal Normal Normal Normal  R. Cervical paraspinals Normal None None None _______ Normal Normal Normal Normal

## 2017-01-10 DIAGNOSIS — M961 Postlaminectomy syndrome, not elsewhere classified: Secondary | ICD-10-CM | POA: Diagnosis not present

## 2017-01-10 DIAGNOSIS — Z79899 Other long term (current) drug therapy: Secondary | ICD-10-CM | POA: Diagnosis not present

## 2017-01-10 DIAGNOSIS — G894 Chronic pain syndrome: Secondary | ICD-10-CM | POA: Diagnosis not present

## 2017-01-10 DIAGNOSIS — M47816 Spondylosis without myelopathy or radiculopathy, lumbar region: Secondary | ICD-10-CM | POA: Diagnosis not present

## 2017-01-10 DIAGNOSIS — Z5181 Encounter for therapeutic drug level monitoring: Secondary | ICD-10-CM | POA: Diagnosis not present

## 2017-01-10 DIAGNOSIS — M545 Low back pain: Secondary | ICD-10-CM | POA: Diagnosis not present

## 2017-03-06 ENCOUNTER — Other Ambulatory Visit: Payer: Self-pay

## 2017-03-06 DIAGNOSIS — K219 Gastro-esophageal reflux disease without esophagitis: Secondary | ICD-10-CM

## 2017-03-06 MED ORDER — ESOMEPRAZOLE MAGNESIUM 40 MG PO CPDR: 40.0000 mg | DELAYED_RELEASE_CAPSULE | Freq: Two times a day (BID) | ORAL | 1 refills | Status: DC

## 2017-03-08 DIAGNOSIS — B079 Viral wart, unspecified: Secondary | ICD-10-CM | POA: Diagnosis not present

## 2017-03-12 DIAGNOSIS — F17200 Nicotine dependence, unspecified, uncomplicated: Secondary | ICD-10-CM | POA: Diagnosis not present

## 2017-03-12 DIAGNOSIS — M47817 Spondylosis without myelopathy or radiculopathy, lumbosacral region: Secondary | ICD-10-CM | POA: Diagnosis not present

## 2017-03-12 DIAGNOSIS — M961 Postlaminectomy syndrome, not elsewhere classified: Secondary | ICD-10-CM | POA: Diagnosis not present

## 2017-03-12 DIAGNOSIS — Z79899 Other long term (current) drug therapy: Secondary | ICD-10-CM | POA: Diagnosis not present

## 2017-03-12 DIAGNOSIS — I10 Essential (primary) hypertension: Secondary | ICD-10-CM | POA: Diagnosis not present

## 2017-03-12 DIAGNOSIS — M47816 Spondylosis without myelopathy or radiculopathy, lumbar region: Secondary | ICD-10-CM | POA: Diagnosis not present

## 2017-03-12 DIAGNOSIS — K219 Gastro-esophageal reflux disease without esophagitis: Secondary | ICD-10-CM | POA: Diagnosis not present

## 2017-03-12 DIAGNOSIS — G894 Chronic pain syndrome: Secondary | ICD-10-CM | POA: Diagnosis not present

## 2017-03-23 ENCOUNTER — Other Ambulatory Visit: Payer: Self-pay | Admitting: Family Medicine

## 2017-03-23 DIAGNOSIS — I10 Essential (primary) hypertension: Secondary | ICD-10-CM

## 2017-03-30 DIAGNOSIS — B079 Viral wart, unspecified: Secondary | ICD-10-CM | POA: Diagnosis not present

## 2017-04-19 DIAGNOSIS — Z5181 Encounter for therapeutic drug level monitoring: Secondary | ICD-10-CM | POA: Diagnosis not present

## 2017-04-19 DIAGNOSIS — M961 Postlaminectomy syndrome, not elsewhere classified: Secondary | ICD-10-CM | POA: Diagnosis not present

## 2017-04-19 DIAGNOSIS — Z79899 Other long term (current) drug therapy: Secondary | ICD-10-CM | POA: Diagnosis not present

## 2017-04-19 DIAGNOSIS — M47816 Spondylosis without myelopathy or radiculopathy, lumbar region: Secondary | ICD-10-CM | POA: Diagnosis not present

## 2017-04-19 DIAGNOSIS — G894 Chronic pain syndrome: Secondary | ICD-10-CM | POA: Diagnosis not present

## 2017-04-19 DIAGNOSIS — M545 Low back pain: Secondary | ICD-10-CM | POA: Diagnosis not present

## 2017-04-27 DIAGNOSIS — B079 Viral wart, unspecified: Secondary | ICD-10-CM | POA: Diagnosis not present

## 2017-05-07 DIAGNOSIS — Z6827 Body mass index (BMI) 27.0-27.9, adult: Secondary | ICD-10-CM | POA: Diagnosis not present

## 2017-05-07 DIAGNOSIS — Z01419 Encounter for gynecological examination (general) (routine) without abnormal findings: Secondary | ICD-10-CM | POA: Diagnosis not present

## 2017-05-07 DIAGNOSIS — Z1231 Encounter for screening mammogram for malignant neoplasm of breast: Secondary | ICD-10-CM | POA: Diagnosis not present

## 2017-05-07 DIAGNOSIS — R300 Dysuria: Secondary | ICD-10-CM | POA: Diagnosis not present

## 2017-05-28 ENCOUNTER — Ambulatory Visit (INDEPENDENT_AMBULATORY_CARE_PROVIDER_SITE_OTHER): Payer: BLUE CROSS/BLUE SHIELD | Admitting: Pediatrics

## 2017-05-28 ENCOUNTER — Encounter: Payer: Self-pay | Admitting: Pediatrics

## 2017-05-28 VITALS — BP 113/69 | HR 76 | Temp 98.2°F | Ht 59.0 in | Wt 134.8 lb

## 2017-05-28 DIAGNOSIS — G5601 Carpal tunnel syndrome, right upper limb: Secondary | ICD-10-CM | POA: Diagnosis not present

## 2017-05-28 NOTE — Progress Notes (Signed)
  Subjective:   Patient ID: Lori Schroeder, female    DOB: 04-Jul-1956, 61 y.o.   MRN: [id number] CC: hand pain HPI: Lori Schroeder is a 61 y.o. female presenting for hand pain  Saw PCP in the spring for R hand pain Did nerve study, told she has carpal tunnel syndrome in R hand Past three weeks having pain up to lower forearm Having more weakness in R hand, has to use L hand to hold coffee Using heating pad regularly Using braces at night, has to take off halfway through the night sometimes bc numbness Does think the braces help some  Relevant past medical, surgical, family and social history reviewed. Allergies and medications reviewed and updated. History  Smoking Status  . Current Every Day Smoker  . Packs/day: 0.30  . Years: 8.00  . Types: Cigarettes  Smokeless Tobacco  . Never Used   ROS: Per HPI   Objective:    BP 113/69   Pulse 76   Temp 98.2 F (36.8 C) (Oral)   Ht 4\' 11"  (1.499 m)   Wt 134 lb 12.8 oz (61.1 kg)   BMI 27.23 kg/m   Wt Readings from Last 3 Encounters:  05/28/17 134 lb 12.8 oz (61.1 kg)  12/19/16 131 lb (59.4 kg)  10/27/16 132 lb (59.9 kg)    Gen: NAD, alert, cooperative with exam, NCAT EYES: EOMI, no conjunctival injection, or no icterus CV:  distal pulses 2+ b/l Resp:  normal WOB Neuro: Alert and oriented, sensation intact b/l hands MSK: 4/5 hand grip R hand, 5/5 left hand  Assessment & Plan:  Diagnoses and all orders for this visit:  Carpal tunnel syndrome on right -     Ambulatory referral to Orthopedic Surgery   Follow up plan: Return if symptoms worsen or fail to improve. Assunta Found, MD [facility]

## 2017-05-30 ENCOUNTER — Ambulatory Visit: Payer: BLUE CROSS/BLUE SHIELD

## 2017-05-30 DIAGNOSIS — G5601 Carpal tunnel syndrome, right upper limb: Secondary | ICD-10-CM | POA: Diagnosis not present

## 2017-06-08 DIAGNOSIS — G5601 Carpal tunnel syndrome, right upper limb: Secondary | ICD-10-CM | POA: Diagnosis not present

## 2017-06-21 ENCOUNTER — Other Ambulatory Visit: Payer: Self-pay | Admitting: Family Medicine

## 2017-06-21 DIAGNOSIS — I10 Essential (primary) hypertension: Secondary | ICD-10-CM

## 2017-07-03 DIAGNOSIS — M961 Postlaminectomy syndrome, not elsewhere classified: Secondary | ICD-10-CM | POA: Diagnosis not present

## 2017-07-03 DIAGNOSIS — G894 Chronic pain syndrome: Secondary | ICD-10-CM | POA: Diagnosis not present

## 2017-07-03 DIAGNOSIS — M545 Low back pain: Secondary | ICD-10-CM | POA: Diagnosis not present

## 2017-07-03 DIAGNOSIS — M47816 Spondylosis without myelopathy or radiculopathy, lumbar region: Secondary | ICD-10-CM | POA: Diagnosis not present

## 2017-07-20 ENCOUNTER — Other Ambulatory Visit: Payer: Self-pay | Admitting: Nurse Practitioner

## 2017-07-20 DIAGNOSIS — E785 Hyperlipidemia, unspecified: Secondary | ICD-10-CM

## 2017-07-23 ENCOUNTER — Encounter: Payer: Self-pay | Admitting: Physician Assistant

## 2017-07-23 ENCOUNTER — Ambulatory Visit (INDEPENDENT_AMBULATORY_CARE_PROVIDER_SITE_OTHER): Payer: BLUE CROSS/BLUE SHIELD | Admitting: Physician Assistant

## 2017-07-23 VITALS — BP 98/63 | HR 100 | Temp 97.6°F | Ht 59.0 in | Wt 132.0 lb

## 2017-07-23 DIAGNOSIS — J40 Bronchitis, not specified as acute or chronic: Secondary | ICD-10-CM | POA: Diagnosis not present

## 2017-07-23 DIAGNOSIS — E785 Hyperlipidemia, unspecified: Secondary | ICD-10-CM | POA: Diagnosis not present

## 2017-07-23 DIAGNOSIS — F17200 Nicotine dependence, unspecified, uncomplicated: Secondary | ICD-10-CM | POA: Diagnosis not present

## 2017-07-23 MED ORDER — METHYLPREDNISOLONE ACETATE 80 MG/ML IJ SUSP
80.0000 mg | Freq: Once | INTRAMUSCULAR | Status: AC
  Administered 2017-07-23: 80 mg via INTRAMUSCULAR

## 2017-07-23 MED ORDER — ALBUTEROL SULFATE HFA 108 (90 BASE) MCG/ACT IN AERS: 2.0000 | INHALATION_SPRAY | Freq: Four times a day (QID) | RESPIRATORY_TRACT | 2 refills | Status: DC | PRN

## 2017-07-23 MED ORDER — DOXYCYCLINE HYCLATE 100 MG PO TABS: 100.0000 mg | ORAL_TABLET | Freq: Two times a day (BID) | ORAL | 0 refills | Status: DC

## 2017-07-23 MED ORDER — ATORVASTATIN CALCIUM 10 MG PO TABS: 10.0000 mg | ORAL_TABLET | Freq: Every day | ORAL | 0 refills | Status: DC

## 2017-07-23 NOTE — Progress Notes (Signed)
BP 98/63   Pulse 100   Temp 97.6 F (36.4 C) (Oral)   Ht 4\' 11"  (1.499 m)   Wt 132 lb (59.9 kg)   BMI 26.66 kg/m    Subjective:    Patient ID: Lori Schroeder, female    DOB: [birth year], 61 y.o.   MRN: [id number]  HPI: Lori Schroeder is a 61 y.o. female presenting on 07/23/2017 for Cough and Wheezing  Patient with several days of progressing upper respiratory and bronchial symptoms. Initially there was more upper respiratory congestion. This progressed to having significant cough that is productive throughout the day and severe at night. There is occasional wheezing after coughing. Sometimes there is slight dyspnea on exertion. It is productive mucus that is yellow in color. Denies any blood.   Relevant past medical, surgical, family and social history reviewed and updated as indicated. Allergies and medications reviewed and updated.  Past Medical History:  Diagnosis Date  . Blood transfusion without reported diagnosis   . External hemorrhoids without mention of complication [id number]   Colonoscopy  . Family history of malignant neoplasm of gastrointestinal tract   . Family history of malignant neoplasm of gastrointestinal tract   . GERD (gastroesophageal reflux disease)   . Hyperlipemia   . Hypertension     Past Surgical History:  Procedure Laterality Date  . ABDOMINAL HYSTERECTOMY    . BACK SURGERY    . BLADDER SURGERY     Bladder Tact  . COLONOSCOPY    . NECK SURGERY      Review of Systems  Constitutional: Positive for chills and fatigue. Negative for activity change and appetite change.  HENT: Positive for congestion, postnasal drip and sore throat.   Eyes: Negative.   Respiratory: Positive for cough and wheezing.   Cardiovascular: Negative.  Negative for chest pain, palpitations and leg swelling.  Gastrointestinal: Negative.   Genitourinary: Negative.   Musculoskeletal: Negative.   Skin: Negative.   Neurological: Positive for headaches.    Allergies as of  07/23/2017   No Known Allergies     Medication List       Accurate as of 07/23/17  3:48 PM. Always use your most recent med list.          albuterol 108 (90 Base) MCG/ACT inhaler Commonly known as:  PROVENTIL HFA;VENTOLIN HFA Inhale 2 puffs into the lungs every 6 (six) hours as needed for wheezing or shortness of breath.   atorvastatin 10 MG tablet Commonly known as:  LIPITOR Take 1 tablet (10 mg total) by mouth daily.   doxycycline 100 MG tablet Commonly known as:  VIBRA-TABS Take 1 tablet (100 mg total) by mouth 2 (two) times daily.   esomeprazole 40 MG capsule Commonly known as:  NEXIUM Take 1 capsule (40 mg total) by mouth 2 (two) times daily.   HYDROmorphone 4 MG tablet Commonly known as:  DILAUDID Take 4 mg by mouth 3 (three) times daily as needed.   lisinopril-hydrochlorothiazide 10-12.5 MG tablet Commonly known as:  PRINZIDE,ZESTORETIC TAKE 1 TABLET DAILY   pregabalin 300 MG capsule Commonly known as:  LYRICA Take 450 mg by mouth daily.   tiZANidine 4 MG tablet Commonly known as:  ZANAFLEX          Objective:    BP 98/63   Pulse 100   Temp 97.6 F (36.4 C) (Oral)   Ht 4\' 11"  (1.499 m)   Wt 132 lb (59.9 kg)   BMI 26.66 kg/m   No Known Allergies  Physical  Exam  Constitutional: She is oriented to person, place, and time. She appears well-developed and well-nourished.  HENT:  Head: Normocephalic and atraumatic.  Right Ear: There is drainage and tenderness.  Left Ear: There is drainage and tenderness.  Nose: Mucosal edema and rhinorrhea present. Right sinus exhibits maxillary sinus tenderness and frontal sinus tenderness. Left sinus exhibits maxillary sinus tenderness and frontal sinus tenderness.  Mouth/Throat: Oropharyngeal exudate and posterior oropharyngeal erythema present.  Eyes: Pupils are equal, round, and reactive to light. Conjunctivae and EOM are normal.  Neck: Normal range of motion. Neck supple.  Cardiovascular: Normal rate, regular  rhythm, normal heart sounds and intact distal pulses.   Pulmonary/Chest: Effort normal. She has wheezes in the right upper field and the left upper field.  Abdominal: Soft. Bowel sounds are normal.  Neurological: She is alert and oriented to person, place, and time. She has normal reflexes.  Skin: Skin is warm and dry. No rash noted.  Psychiatric: She has a normal mood and affect. Her behavior is normal. Judgment and thought content normal.        Assessment & Plan:   1. Bronchitis - methylPREDNISolone acetate (DEPO-MEDROL) injection 80 mg; Inject 1 mL (80 mg total) into the muscle once.  2. Smoker - methylPREDNISolone acetate (DEPO-MEDROL) injection 80 mg; Inject 1 mL (80 mg total) into the muscle once.  3. Hyperlipidemia with target LDL less than 100 - atorvastatin (LIPITOR) 10 MG tablet; Take 1 tablet (10 mg total) by mouth daily.  Dispense: 90 tablet; Refill: 0    Current Outpatient Prescriptions:  .  atorvastatin (LIPITOR) 10 MG tablet, Take 1 tablet (10 mg total) by mouth daily., Disp: 90 tablet, Rfl: 0 .  esomeprazole (NEXIUM) 40 MG capsule, Take 1 capsule (40 mg total) by mouth 2 (two) times daily., Disp: 180 capsule, Rfl: 1 .  HYDROmorphone (DILAUDID) 4 MG tablet, Take 4 mg by mouth 3 (three) times daily as needed., Disp: , Rfl:  .  lisinopril-hydrochlorothiazide (PRINZIDE,ZESTORETIC) 10-12.5 MG tablet, TAKE 1 TABLET DAILY, Disp: 90 tablet, Rfl: 0 .  pregabalin (LYRICA) 300 MG capsule, Take 450 mg by mouth daily. , Disp: , Rfl:  .  tiZANidine (ZANAFLEX) 4 MG tablet, , Disp: , Rfl:  .  albuterol (PROVENTIL HFA;VENTOLIN HFA) 108 (90 Base) MCG/ACT inhaler, Inhale 2 puffs into the lungs every 6 (six) hours as needed for wheezing or shortness of breath., Disp: 1 Inhaler, Rfl: 2 .  doxycycline (VIBRA-TABS) 100 MG tablet, Take 1 tablet (100 mg total) by mouth 2 (two) times daily., Disp: 20 tablet, Rfl: 0  Current Facility-Administered Medications:  .  0.9 %  sodium chloride  infusion, 500 mL, Intravenous, Continuous, Danis, Henry L III, MD .  methylPREDNISolone acetate (DEPO-MEDROL) injection 80 mg, 80 mg, Intramuscular, Once, Darcy Cordner S, PA-C Continue all other maintenance medications as listed above.  Follow up plan: Return if symptoms worsen or fail to improve.  Educational handout given for [place] PA-C [place] [address]  [place], [place] [address] [phone]   07/23/2017, 3:48 PM

## 2017-07-23 NOTE — Patient Instructions (Signed)
In a few days you may receive a survey in the mail or online from [employer] regarding your visit with us today. Please take a moment to fill this out. Your feedback is very important to our whole office. It can help us better understand your needs as well as improve your experience and satisfaction. Thank you for taking your time to complete it. We care about you.  Tzirel Leonor, PA-C  

## 2017-09-18 ENCOUNTER — Other Ambulatory Visit: Payer: Self-pay | Admitting: Nurse Practitioner

## 2017-09-18 DIAGNOSIS — I10 Essential (primary) hypertension: Secondary | ICD-10-CM

## 2017-10-18 DIAGNOSIS — M961 Postlaminectomy syndrome, not elsewhere classified: Secondary | ICD-10-CM | POA: Diagnosis not present

## 2017-10-18 DIAGNOSIS — Z5181 Encounter for therapeutic drug level monitoring: Secondary | ICD-10-CM | POA: Diagnosis not present

## 2017-10-18 DIAGNOSIS — M545 Low back pain: Secondary | ICD-10-CM | POA: Diagnosis not present

## 2017-10-18 DIAGNOSIS — G894 Chronic pain syndrome: Secondary | ICD-10-CM | POA: Diagnosis not present

## 2017-10-18 DIAGNOSIS — Z79899 Other long term (current) drug therapy: Secondary | ICD-10-CM | POA: Diagnosis not present

## 2017-10-18 DIAGNOSIS — M47816 Spondylosis without myelopathy or radiculopathy, lumbar region: Secondary | ICD-10-CM | POA: Diagnosis not present

## 2017-11-06 ENCOUNTER — Encounter: Payer: BLUE CROSS/BLUE SHIELD | Admitting: Family Medicine

## 2017-11-23 ENCOUNTER — Ambulatory Visit: Payer: BLUE CROSS/BLUE SHIELD | Admitting: Family Medicine

## 2017-11-26 ENCOUNTER — Encounter: Payer: Self-pay | Admitting: Family Medicine

## 2017-12-03 ENCOUNTER — Ambulatory Visit (INDEPENDENT_AMBULATORY_CARE_PROVIDER_SITE_OTHER): Payer: BLUE CROSS/BLUE SHIELD | Admitting: Family Medicine

## 2017-12-03 ENCOUNTER — Encounter: Payer: Self-pay | Admitting: Family Medicine

## 2017-12-03 ENCOUNTER — Ambulatory Visit (INDEPENDENT_AMBULATORY_CARE_PROVIDER_SITE_OTHER): Payer: BLUE CROSS/BLUE SHIELD

## 2017-12-03 VITALS — BP 106/62 | HR 56 | Temp 97.1°F | Ht 59.0 in | Wt 127.4 lb

## 2017-12-03 DIAGNOSIS — Z720 Tobacco use: Secondary | ICD-10-CM | POA: Diagnosis not present

## 2017-12-03 DIAGNOSIS — R05 Cough: Secondary | ICD-10-CM

## 2017-12-03 DIAGNOSIS — E785 Hyperlipidemia, unspecified: Secondary | ICD-10-CM | POA: Diagnosis not present

## 2017-12-03 DIAGNOSIS — I10 Essential (primary) hypertension: Secondary | ICD-10-CM

## 2017-12-03 DIAGNOSIS — R059 Cough, unspecified: Secondary | ICD-10-CM

## 2017-12-03 MED ORDER — METHYLPREDNISOLONE ACETATE 80 MG/ML IJ SUSP
80.0000 mg | Freq: Once | INTRAMUSCULAR | Status: AC
  Administered 2017-12-03: 80 mg via INTRAMUSCULAR

## 2017-12-03 MED ORDER — VARENICLINE TARTRATE 1 MG PO TABS: 1.0000 mg | ORAL_TABLET | Freq: Two times a day (BID) | ORAL | 3 refills | Status: DC

## 2017-12-03 MED ORDER — VARENICLINE TARTRATE 0.5 MG X 11 & 1 MG X 42 PO MISC: ORAL | 0 refills | Status: DC

## 2017-12-03 NOTE — Patient Instructions (Signed)
Great to see you!   

## 2017-12-03 NOTE — Progress Notes (Signed)
   HPI  Patient presents today for acute illness.  Patient explains she has had cough, congestion, shortness of breath, and facial pressure for about 2 days. She states that she is a smoker and wants to quit.  She also endorses smoker's cough.  She denies depression, anxiety, or suicidal thoughts.  She is also fasted. She reports good medication compliance with Prinzide and Lipitor.  PMH: Smoking status noted ROS: Per HPI  Objective: BP 106/62   Pulse (!) 56   Temp (!) 97.1 F (36.2 C) (Oral)   Ht 4\' 11"  (1.499 m)   Wt 127 lb 6.4 oz (57.8 kg)   SpO2 99%   BMI 25.73 kg/m  Gen: NAD, alert, cooperative with exam HEENT: NCAT, no tenderness to palpation of bilateral maxillary or frontal sinuses CV: RRR, good S1/S2, no murmur Resp: CTABL, no wheezes, non-labored Ext: No edema, warm Neuro: Alert and oriented, No gross deficits  Assessment and plan:  #Cough Possible COPD exacerbation with perhaps undiagnosed COPD IM Depo-Medrol, plain film to rule out need for antibiotics. No sinus tenderness to indicate sinus infection  #Tobacco abuse Thorough discussion about Chantix today, prescribed  #Hypertension Well controlled on Prinzide, labs fasting  #Hyperlipidemia Previously well controlled with Lipitor, labs     Orders Placed This Encounter  Procedures  . DG Chest 2 View    Standing Status:   Future    Standing Expiration Date:   02/03/2019    Order Specific Question:   Reason for Exam (SYMPTOM  OR DIAGNOSIS REQUIRED)    Answer:   cough    Order Specific Question:   Is patient pregnant?    Answer:   No    Order Specific Question:   Preferred imaging location?    Answer:   Internal    Order Specific Question:   Radiology Contrast Protocol - do NOT remove file path    Answer:   [email or web address]    Meds ordered this encounter  Medications  . methylPREDNISolone acetate (DEPO-MEDROL) injection 80 mg    Laroy Apple,  MD [facility] 12/03/2017, 11:14 AM

## 2017-12-04 ENCOUNTER — Telehealth: Payer: Self-pay | Admitting: Family Medicine

## 2017-12-04 ENCOUNTER — Other Ambulatory Visit: Payer: Self-pay | Admitting: *Deleted

## 2017-12-04 DIAGNOSIS — R7309 Other abnormal glucose: Secondary | ICD-10-CM

## 2017-12-04 LAB — CMP14+EGFR
ALBUMIN: 4.4 g/dL (ref 3.6–4.8)
ALT: 18 IU/L (ref 0–32)
AST: 15 IU/L (ref 0–40)
Albumin/Globulin Ratio: 1.6 (ref 1.2–2.2)
Alkaline Phosphatase: 146 IU/L — ABNORMAL HIGH (ref 39–117)
BILIRUBIN TOTAL: 0.3 mg/dL (ref 0.0–1.2)
BUN / CREAT RATIO: 13 (ref 12–28)
BUN: 9 mg/dL (ref 8–27)
CALCIUM: 9.6 mg/dL (ref 8.7–10.3)
CHLORIDE: 95 mmol/L — AB (ref 96–106)
CO2: 23 mmol/L (ref 20–29)
Creatinine, Ser: 0.72 mg/dL (ref 0.57–1.00)
GFR, EST AFRICAN AMERICAN: 105 mL/min/{1.73_m2} (ref >59)
GFR, EST NON AFRICAN AMERICAN: 91 mL/min/{1.73_m2} (ref >59)
Globulin, Total: 2.8 g/dL (ref 1.5–4.5)
Glucose: 227 mg/dL — ABNORMAL HIGH (ref 65–99)
Potassium: 4.4 mmol/L (ref 3.5–5.2)
Sodium: 134 mmol/L (ref 134–144)
TOTAL PROTEIN: 7.2 g/dL (ref 6.0–8.5)

## 2017-12-04 LAB — CBC WITH DIFFERENTIAL/PLATELET
BASOS: 1 %
Basophils Absolute: 0.1 10*3/uL (ref 0.0–0.2)
EOS (ABSOLUTE): 0.1 10*3/uL (ref 0.0–0.4)
Eos: 1 %
HEMATOCRIT: 41.7 % (ref 34.0–46.6)
HEMOGLOBIN: 14.2 g/dL (ref 11.1–15.9)
IMMATURE GRANS (ABS): 0 10*3/uL (ref 0.0–0.1)
Immature Granulocytes: 0 %
LYMPHS: 32 %
Lymphocytes Absolute: 3.8 10*3/uL — ABNORMAL HIGH (ref 0.7–3.1)
MCH: 30.7 pg (ref 26.6–33.0)
MCHC: 34.1 g/dL (ref 31.5–35.7)
MCV: 90 fL (ref 79–97)
MONOCYTES: 5 %
Monocytes Absolute: 0.6 10*3/uL (ref 0.1–0.9)
NEUTROS ABS: 7.4 10*3/uL — AB (ref 1.4–7.0)
Neutrophils: 61 %
Platelets: 494 10*3/uL — ABNORMAL HIGH (ref 150–379)
RBC: 4.63 x10E6/uL (ref 3.77–5.28)
RDW: 12.8 % (ref 12.3–15.4)
WBC: 12.1 10*3/uL — ABNORMAL HIGH (ref 3.4–10.8)

## 2017-12-04 LAB — LIPID PANEL
Chol/HDL Ratio: 3.7 ratio (ref 0.0–4.4)
Cholesterol, Total: 164 mg/dL (ref 100–199)
HDL: 44 mg/dL (ref >39)
LDL Calculated: 89 mg/dL (ref 0–99)
Triglycerides: 153 mg/dL — ABNORMAL HIGH (ref 0–149)
VLDL CHOLESTEROL CAL: 31 mg/dL (ref 5–40)

## 2017-12-04 LAB — TSH: TSH: 1.71 u[IU]/mL (ref 0.450–4.500)

## 2017-12-04 MED ORDER — PREDNISONE 20 MG PO TABS: ORAL_TABLET | ORAL | 0 refills | Status: DC

## 2017-12-04 MED ORDER — AZITHROMYCIN 250 MG PO TABS: ORAL_TABLET | ORAL | 0 refills | Status: DC

## 2017-12-04 NOTE — Telephone Encounter (Signed)
Pt was given IM steroids instead of po prednisone, will send in pills aslo if needed but it may be a simple misunderstanding.   Will go ahead and send some in.   Laroy Apple, MD [facility] 12/04/2017, 12:01 PM

## 2017-12-04 NOTE — Telephone Encounter (Signed)
Patient aware and would like an oral dose to be sent to pharmacy.  Informed patient that Dr. Wendi Snipes has sent to that to pharmacy

## 2017-12-06 ENCOUNTER — Telehealth: Payer: Self-pay | Admitting: Family Medicine

## 2017-12-06 LAB — SPECIMEN STATUS REPORT

## 2017-12-06 LAB — HGB A1C W/O EAG: Hgb A1c MFr Bld: 11.5 % — ABNORMAL HIGH (ref 4.8–5.6)

## 2017-12-06 NOTE — Telephone Encounter (Signed)
No answer, No VM  New DM2 Dx, will try again.   Laroy Apple, MD [facility] 12/06/2017, 11:51 AM

## 2017-12-07 ENCOUNTER — Telehealth: Payer: Self-pay | Admitting: Family Medicine

## 2017-12-07 MED ORDER — METFORMIN HCL 500 MG PO TABS
500.0000 mg | ORAL_TABLET | Freq: Two times a day (BID) | ORAL | 3 refills | Status: DC
Start: 2017-12-07 — End: 2017-12-10

## 2017-12-07 NOTE — Telephone Encounter (Signed)
Called and discussed with patient new DM2 Dx  Starting metformin.  Recommended diabetic education class, she is hesitant to follow through with this.  Recommended 1 month follow-up, no CBGs for now just emphasis on starting metformin and changing diet.  Previously done very well with a low-carb diet and feels very comfortable that she can identify carbohydrates easily.  Other labs as well  Laroy Apple, MD [facility] 12/07/2017, 11:53 AM

## 2017-12-10 ENCOUNTER — Telehealth: Payer: Self-pay | Admitting: Family Medicine

## 2017-12-10 MED ORDER — SITAGLIPTIN PHOSPHATE 100 MG PO TABS: 100.0000 mg | ORAL_TABLET | Freq: Every day | ORAL | 3 refills | Status: DC

## 2017-12-10 NOTE — Telephone Encounter (Signed)
DC metformin due to intolerance, GI side effects  Start [place].   Laroy Apple, MD [facility] 12/10/2017, 11:43 AM

## 2017-12-22 ENCOUNTER — Other Ambulatory Visit: Payer: Self-pay | Admitting: Nurse Practitioner

## 2017-12-22 DIAGNOSIS — I10 Essential (primary) hypertension: Secondary | ICD-10-CM

## 2017-12-26 DIAGNOSIS — M47816 Spondylosis without myelopathy or radiculopathy, lumbar region: Secondary | ICD-10-CM | POA: Diagnosis not present

## 2017-12-26 DIAGNOSIS — G894 Chronic pain syndrome: Secondary | ICD-10-CM | POA: Diagnosis not present

## 2017-12-26 DIAGNOSIS — M545 Low back pain: Secondary | ICD-10-CM | POA: Diagnosis not present

## 2017-12-26 DIAGNOSIS — M961 Postlaminectomy syndrome, not elsewhere classified: Secondary | ICD-10-CM | POA: Diagnosis not present

## 2017-12-27 ENCOUNTER — Ambulatory Visit: Payer: BLUE CROSS/BLUE SHIELD | Admitting: Family Medicine

## 2018-01-04 ENCOUNTER — Other Ambulatory Visit: Payer: Self-pay | Admitting: Nurse Practitioner

## 2018-01-04 ENCOUNTER — Ambulatory Visit: Payer: BLUE CROSS/BLUE SHIELD | Admitting: Family Medicine

## 2018-01-04 DIAGNOSIS — K219 Gastro-esophageal reflux disease without esophagitis: Secondary | ICD-10-CM

## 2018-01-08 ENCOUNTER — Encounter: Payer: Self-pay | Admitting: Family Medicine

## 2018-01-08 ENCOUNTER — Ambulatory Visit (INDEPENDENT_AMBULATORY_CARE_PROVIDER_SITE_OTHER): Payer: BLUE CROSS/BLUE SHIELD | Admitting: Family Medicine

## 2018-01-08 DIAGNOSIS — E1165 Type 2 diabetes mellitus with hyperglycemia: Secondary | ICD-10-CM | POA: Diagnosis not present

## 2018-01-08 MED ORDER — DULAGLUTIDE 0.75 MG/0.5ML ~~LOC~~ SOAJ: 0.7500 mg | SUBCUTANEOUS | 5 refills | Status: DC

## 2018-01-08 NOTE — Patient Instructions (Signed)
Great to see you!  Take tradjenta until you receive your first shot of trulicity, come back to have our triage nurse teach you how to take the injection.

## 2018-01-08 NOTE — Progress Notes (Signed)
   HPI  Patient presents today here to follow-up for diabetes.  Patient was seen last month and A1c was found to be over 11. She did not tolerate metformin due to GI upset.  She is taking Januvia once daily with no problems.  She denies hypoglycemia.  She is following a low carbohydrate diet, she is interested in weight loss.  She brings in a blood sugar log, her fasting blood sugars over the last 10 days have ranged 150-200 Her random postprandial blood sugars have been 230-450 No hypoglycemia She is watching her diet closely  PMH: Smoking status noted ROS: Per HPI  Objective: BP 94/61   Pulse 79   Temp 98.4 F (36.9 C) (Oral)   Ht 4\' 11"  (1.499 m)   Wt 127 lb 9.6 oz (57.9 kg)   BMI 25.77 kg/m  Gen: NAD, alert, cooperative with exam HEENT: NCAT CV: RRR, good S1/S2, no murmur Resp: CTABL, no wheezes, non-labored Ext: No edema, warm Neuro: Alert and oriented, No gross deficits  Assessment and plan:  #Type 2 diabetes with hyperglycemia CBGs improving, fasting blood sugars are almost at goal Changing from Januvia to Trulicity Postprandials are uncontrolled Given 2 weeks sample of Tradjenta so that she has time to go to the pharmacy filled the Trulicity and come back and be taught how to give the injection.   Meds ordered this encounter  Medications  . Dulaglutide (TRULICITY) 6.73 AL/9.3XT SOPN    Sig: Inject 0.75 mg into the skin once a week.    Dispense:  4 pen    Refill:  Colbert, MD [facility] 01/08/2018, 5:56 PM

## 2018-01-15 ENCOUNTER — Ambulatory Visit: Payer: BLUE CROSS/BLUE SHIELD | Admitting: Family Medicine

## 2018-01-24 DIAGNOSIS — M47816 Spondylosis without myelopathy or radiculopathy, lumbar region: Secondary | ICD-10-CM | POA: Diagnosis not present

## 2018-01-24 DIAGNOSIS — M961 Postlaminectomy syndrome, not elsewhere classified: Secondary | ICD-10-CM | POA: Diagnosis not present

## 2018-01-24 DIAGNOSIS — G894 Chronic pain syndrome: Secondary | ICD-10-CM | POA: Diagnosis not present

## 2018-01-24 DIAGNOSIS — M545 Low back pain: Secondary | ICD-10-CM | POA: Diagnosis not present

## 2018-01-28 ENCOUNTER — Encounter: Payer: Self-pay | Admitting: Family Medicine

## 2018-01-28 ENCOUNTER — Ambulatory Visit (INDEPENDENT_AMBULATORY_CARE_PROVIDER_SITE_OTHER): Payer: BLUE CROSS/BLUE SHIELD | Admitting: Family Medicine

## 2018-01-28 VITALS — BP 100/70 | HR 84 | Temp 98.0°F | Ht 59.0 in | Wt 123.0 lb

## 2018-01-28 DIAGNOSIS — E1165 Type 2 diabetes mellitus with hyperglycemia: Secondary | ICD-10-CM

## 2018-01-28 DIAGNOSIS — R059 Cough, unspecified: Secondary | ICD-10-CM

## 2018-01-28 DIAGNOSIS — I10 Essential (primary) hypertension: Secondary | ICD-10-CM

## 2018-01-28 DIAGNOSIS — Z780 Asymptomatic menopausal state: Secondary | ICD-10-CM

## 2018-01-28 DIAGNOSIS — Z0000 Encounter for general adult medical examination without abnormal findings: Secondary | ICD-10-CM

## 2018-01-28 DIAGNOSIS — R05 Cough: Secondary | ICD-10-CM

## 2018-01-28 MED ORDER — LISINOPRIL 5 MG PO TABS: 5.0000 mg | ORAL_TABLET | Freq: Every day | ORAL | 3 refills | Status: DC

## 2018-01-28 MED ORDER — DOXYCYCLINE HYCLATE 100 MG PO TABS: 100.0000 mg | ORAL_TABLET | Freq: Two times a day (BID) | ORAL | 0 refills | Status: DC

## 2018-01-28 MED ORDER — DULAGLUTIDE 1.5 MG/0.5ML ~~LOC~~ SOAJ: 1.5000 mg | SUBCUTANEOUS | 11 refills | Status: DC

## 2018-01-28 NOTE — Progress Notes (Signed)
   HPI  Patient presents today here for follow-up chronic medical conditions as well as annual physical exam.  Patient feels well kept for not currently having her pain medication due to dosage changing.  Patient states that she is doing very well with watching her diet.  She is tolerating Trulicity well. She did not tolerate metformin. She used Januvia for a short time while transitioning to [employer]. She has lost a few pounds.  She is moderately active. She is watching her diet well as above  She also states that she has had a cough, shortness of breath, and chills with chest congestion for 5 days. She denies sinus pain or pressure. She was treated with IM Depo-Medrol about a month ago, on the day of her diabetes was diagnosed.  She was not on steroids whenever her blood sugar was checked.  Reports average fasting blood sugar of 110-150, postprandials 160- 180   PMH: Smoking status noted ROS: Per HPI  Objective: BP 100/70   Pulse 84   Temp 98 F (36.7 C) (Oral)   Ht 4\' 11"  (1.499 m)   Wt 123 lb (55.8 kg)   SpO2 100%   BMI 24.84 kg/m  Gen: NAD, alert, cooperative with exam HEENT: NCAT CV: RRR, good S1/S2, no murmur Resp: CTABL, no wheezes, non-labored Ext: No edema, warm Neuro: Alert and oriented, No gross deficits   Diabetic Foot Exam - Simple   Simple Foot Form Diabetic Foot exam was performed with the following findings:  Yes 01/28/2018 10:15 AM  Visual Inspection No deformities, no ulcerations, no other skin breakdown bilaterally:  Yes Sensation Testing Intact to touch and monofilament testing bilaterally:  Yes Pulse Check Posterior Tibialis and Dorsalis pulse intact bilaterally:  Yes Comments      Assessment and plan:  #Physical exam She has had a bit of weight loss, she is watching her diet well. She has recently diagnosed diabetes which is summarized below. Discussed DEXA scan with her, she will schedule She has recently had a Pap smear,  mammogram. She reports a normal colonoscopy 3 to 4 years ago at [facility]  #Type 2 diabetes Very uncontrolled, however control improving Continue Trulicity, optimize to max dose  #Hypertension Soft blood pressure, DC Prinzide, continue 5 mg lisinopril  #Cough Given her uncontrolled diabetes and cough for 5 days with chills I treated a bit more aggressively than usual and given doxycycline to cover for pneumonia Lung exam is reassuring, avoiding steroids for now- discussed hyperglycemia with steroids.    Meds ordered this encounter  Medications  . Dulaglutide (TRULICITY) 1.5 TZ/0.0FV SOPN    Sig: Inject 1.5 mg into the skin once a week.    Dispense:  4 pen    Refill:  11  . lisinopril (PRINIVIL,ZESTRIL) 5 MG tablet    Sig: Take 1 tablet (5 mg total) by mouth daily.    Dispense:  90 tablet    Refill:  3    Please dc prinzide  . doxycycline (VIBRA-TABS) 100 MG tablet    Sig: Take 1 tablet (100 mg total) by mouth 2 (two) times daily. 1 po bid    Dispense:  20 tablet    Refill:  0    Laroy Apple, MD [facility] 01/28/2018, 10:19 AM

## 2018-02-11 ENCOUNTER — Other Ambulatory Visit: Payer: Self-pay | Admitting: Physician Assistant

## 2018-02-11 DIAGNOSIS — E785 Hyperlipidemia, unspecified: Secondary | ICD-10-CM

## 2018-02-18 ENCOUNTER — Other Ambulatory Visit: Payer: BLUE CROSS/BLUE SHIELD

## 2018-03-01 ENCOUNTER — Other Ambulatory Visit: Payer: BLUE CROSS/BLUE SHIELD

## 2018-03-06 DIAGNOSIS — G894 Chronic pain syndrome: Secondary | ICD-10-CM | POA: Diagnosis not present

## 2018-03-06 DIAGNOSIS — M5416 Radiculopathy, lumbar region: Secondary | ICD-10-CM | POA: Diagnosis not present

## 2018-03-06 DIAGNOSIS — Z79899 Other long term (current) drug therapy: Secondary | ICD-10-CM | POA: Diagnosis not present

## 2018-03-06 DIAGNOSIS — M545 Low back pain: Secondary | ICD-10-CM | POA: Diagnosis not present

## 2018-03-06 DIAGNOSIS — M961 Postlaminectomy syndrome, not elsewhere classified: Secondary | ICD-10-CM | POA: Diagnosis not present

## 2018-03-06 DIAGNOSIS — Z5181 Encounter for therapeutic drug level monitoring: Secondary | ICD-10-CM | POA: Diagnosis not present

## 2018-03-12 ENCOUNTER — Ambulatory Visit: Payer: BLUE CROSS/BLUE SHIELD | Admitting: Family Medicine

## 2018-03-12 ENCOUNTER — Other Ambulatory Visit: Payer: BLUE CROSS/BLUE SHIELD

## 2018-03-14 ENCOUNTER — Encounter: Payer: Self-pay | Admitting: Family Medicine

## 2018-03-25 DIAGNOSIS — M5126 Other intervertebral disc displacement, lumbar region: Secondary | ICD-10-CM | POA: Diagnosis not present

## 2018-03-25 DIAGNOSIS — M47816 Spondylosis without myelopathy or radiculopathy, lumbar region: Secondary | ICD-10-CM | POA: Diagnosis not present

## 2018-03-25 DIAGNOSIS — Z9889 Other specified postprocedural states: Secondary | ICD-10-CM | POA: Diagnosis not present

## 2018-03-25 DIAGNOSIS — M961 Postlaminectomy syndrome, not elsewhere classified: Secondary | ICD-10-CM | POA: Diagnosis not present

## 2018-03-25 DIAGNOSIS — G894 Chronic pain syndrome: Secondary | ICD-10-CM | POA: Diagnosis not present

## 2018-04-09 DIAGNOSIS — M545 Low back pain: Secondary | ICD-10-CM | POA: Diagnosis not present

## 2018-04-09 DIAGNOSIS — M961 Postlaminectomy syndrome, not elsewhere classified: Secondary | ICD-10-CM | POA: Diagnosis not present

## 2018-04-09 DIAGNOSIS — G894 Chronic pain syndrome: Secondary | ICD-10-CM | POA: Diagnosis not present

## 2018-04-09 DIAGNOSIS — M5416 Radiculopathy, lumbar region: Secondary | ICD-10-CM | POA: Diagnosis not present

## 2018-05-16 DIAGNOSIS — M545 Low back pain: Secondary | ICD-10-CM | POA: Diagnosis not present

## 2018-05-16 DIAGNOSIS — M961 Postlaminectomy syndrome, not elsewhere classified: Secondary | ICD-10-CM | POA: Diagnosis not present

## 2018-05-16 DIAGNOSIS — M47816 Spondylosis without myelopathy or radiculopathy, lumbar region: Secondary | ICD-10-CM | POA: Diagnosis not present

## 2018-05-16 DIAGNOSIS — G894 Chronic pain syndrome: Secondary | ICD-10-CM | POA: Diagnosis not present

## 2018-05-28 LAB — HM MAMMOGRAPHY

## 2018-05-29 ENCOUNTER — Ambulatory Visit (INDEPENDENT_AMBULATORY_CARE_PROVIDER_SITE_OTHER): Payer: BLUE CROSS/BLUE SHIELD | Admitting: Family Medicine

## 2018-05-29 ENCOUNTER — Encounter: Payer: Self-pay | Admitting: Family Medicine

## 2018-05-29 VITALS — BP 128/70 | HR 74 | Temp 97.9°F | Ht 59.0 in | Wt 113.0 lb

## 2018-05-29 DIAGNOSIS — E1165 Type 2 diabetes mellitus with hyperglycemia: Secondary | ICD-10-CM

## 2018-05-29 DIAGNOSIS — H66001 Acute suppurative otitis media without spontaneous rupture of ear drum, right ear: Secondary | ICD-10-CM | POA: Diagnosis not present

## 2018-05-29 DIAGNOSIS — F17200 Nicotine dependence, unspecified, uncomplicated: Secondary | ICD-10-CM

## 2018-05-29 DIAGNOSIS — Z23 Encounter for immunization: Secondary | ICD-10-CM

## 2018-05-29 LAB — BAYER DCA HB A1C WAIVED: HB A1C (BAYER DCA - WAIVED): 6.3 % (ref <7.0)

## 2018-05-29 MED ORDER — DULAGLUTIDE 1.5 MG/0.5ML ~~LOC~~ SOAJ: 1.5000 mg | SUBCUTANEOUS | 4 refills | Status: DC

## 2018-05-29 MED ORDER — AMOXICILLIN-POT CLAVULANATE 875-125 MG PO TABS: 1.0000 | ORAL_TABLET | Freq: Two times a day (BID) | ORAL | 0 refills | Status: DC

## 2018-05-29 NOTE — Progress Notes (Signed)
Subjective: CC: DM PCP: [name], DO GHW:EXHBZ Lori Schroeder is a 62 y.o. female presenting to clinic today for:  1. Type 2 Diabetes:  Patient reports High at home: 170; Low at home: 104, Taking medication(s): Trulicity 1.5 sub q every week, Side effects: none  Last eye exam: Oct 2018. Sees Dr Marin Comment. Last foot exam: 12/2017 Last A1c: 11/2017 11.5 Nephropathy screen indicated?: on Lisinopril 5mg  Last flu, zoster and/or pneumovax: Needs PNA and TDap  ROS: denies fever, chills, dizziness, LOC, polyuria, polydipsia, unintended weight loss/gain, foot ulcerations, numbness or tingling in extremities or chest pain.  2. Sinus symptoms/ tobacco use disorder Patient reports onset of sinus symptoms Thursday.  She describes a worsening sinus headache, right ear pain, nasal congestion with purulent nasal discharge.  She reports subjective fevers at home, wheeze and cough.  She is an everyday tobacco smoker.  She notes that she failed Chantix in the past but was able to quit cold [place] many years ago and actually did not smoke for about 22 years.  No hemoptysis.   ROS: Per HPI  No Known Allergies Past Medical History:  Diagnosis Date  . Blood transfusion without reported diagnosis   . External hemorrhoids without mention of complication [id number]   Colonoscopy  . Family history of malignant neoplasm of gastrointestinal tract   . Family history of malignant neoplasm of gastrointestinal tract   . GERD (gastroesophageal reflux disease)   . Hyperlipemia   . Hypertension     Current Outpatient Medications:  .  albuterol (PROVENTIL HFA;VENTOLIN HFA) 108 (90 Base) MCG/ACT inhaler, Inhale 2 puffs into the lungs every 6 (six) hours as needed for wheezing or shortness of breath., Disp: 1 Inhaler, Rfl: 2 .  atorvastatin (LIPITOR) 10 MG tablet, TAKE 1 TABLET DAILY, Disp: 90 tablet, Rfl: 1 .  Dulaglutide (TRULICITY) 1.5 FY/1.0FB SOPN, Inject 1.5 mg into the skin once a week., Disp: 12 pen, Rfl: 4 .   esomeprazole (NEXIUM) 40 MG capsule, TAKE  (1)  CAPSULE  TWICE DAILY., Disp: 180 capsule, Rfl: 1 .  lisinopril (PRINIVIL,ZESTRIL) 5 MG tablet, Take 1 tablet (5 mg total) by mouth daily., Disp: 90 tablet, Rfl: 3 .  NUCYNTA 75 MG tablet, Take 75 mg by mouth 3 (three) times daily as needed. , Disp: , Rfl: 0 .  pregabalin (LYRICA) 300 MG capsule, Take 450 mg by mouth daily. , Disp: , Rfl:   Social History   Socioeconomic History  . Marital status: Married    Spouse name: Not on file  . Number of children: 2  . Years of education: Not on file  . Highest education level: Not on file  Occupational History  . Occupation: disability    Employer: DISABLED  Social Needs  . Financial resource strain: Not on file  . Food insecurity:    Worry: Not on file    Inability: Not on file  . Transportation needs:    Medical: Not on file    Non-medical: Not on file  Tobacco Use  . Smoking status: Current Every Day Smoker    Packs/day: 0.30    Years: 8.00    Pack years: 2.40    Types: Cigarettes  . Smokeless tobacco: Never Used  Substance and Sexual Activity  . Alcohol use: No  . Drug use: No  . Sexual activity: Not on file  Lifestyle  . Physical activity:    Days per week: Not on file    Minutes per session: Not on file  .  Stress: Not on file  Relationships  . Social connections:    Talks on phone: Not on file    Gets together: Not on file    Attends religious service: Not on file    Active member of club or organization: Not on file    Attends meetings of clubs or organizations: Not on file    Relationship status: Not on file  . Intimate partner violence:    Fear of current or ex partner: Not on file    Emotionally abused: Not on file    Physically abused: Not on file    Forced sexual activity: Not on file  Other Topics Concern  . Not on file  Social History Narrative  . Not on file   Family History  Problem Relation Age of Onset  . Colon cancer Maternal Grandmother   . Breast  cancer Neg Hx   . Celiac disease Neg Hx   . Cirrhosis Neg Hx   . Clotting disorder Neg Hx   . Colitis Neg Hx   . Colon polyps Neg Hx   . Crohn's disease Neg Hx   . Cystic fibrosis Neg Hx   . Diabetes Neg Hx   . Esophageal cancer Neg Hx   . Heart disease Neg Hx   . Hemochromatosis Neg Hx   . Inflammatory bowel disease Neg Hx   . Irritable bowel syndrome Neg Hx   . Kidney disease Neg Hx   . Liver cancer Neg Hx   . Liver disease Neg Hx   . Ovarian cancer Neg Hx   . Pancreatic cancer Neg Hx   . Prostate cancer Neg Hx   . Rectal cancer Neg Hx   . Stomach cancer Neg Hx   . Ulcerative colitis Neg Hx   . Uterine cancer Neg Hx   . Wilson's disease Neg Hx     Objective: Office vital signs reviewed. BP 128/70   Pulse 74   Temp 97.9 F (36.6 C) (Oral)   Ht 4\' 11"  (1.499 m)   Wt 113 lb (51.3 kg)   BMI 22.82 kg/m   Physical Examination:  General: Awake, alert, nontoxic, No acute distress HEENT: Normal    Neck: No masses palpated. No lymphadenopathy    Ears: Tympanic membranes intact, dulled light reflex on right w/ purulent discharge behind TM, mild erythema, mild bulging    Eyes: PERRLA, extraocular membranes intact, sclera white    Nose: nasal turbinates moist, clear nasal discharge    Throat: moist mucus membranes, no erythema, no tonsillar exudate.  Airway is patent Cardio: regular rate and rhythm, S1S2 heard, no murmurs appreciated Pulm: Global expiratory wheezes w/ good air movement. No rhonchi or rales; normal work of breathing on room air  Assessment/ Plan: 62 y.o. female   1. Type 2 diabetes mellitus with hyperglycemia, without long-term current use of insulin (HCC) Well-controlled.  Her A1c was 6.3 today.  No changes in medication.  This is been sent to pharmacy for 1 year.  We will get her diabetic eye exam from Dr. Truman Hayward.  Her pneumonia shot was updated today. - Bayer DCA Hb A1c Waived  2. Non-recurrent acute suppurative otitis media of right ear without  spontaneous rupture of tympanic membrane Clinically consistent with a right otitis media.  Augmentin 875 p.o. twice daily prescribed.  Home care instructions were reviewed.  Follow-up PRN.  3. Tobacco use disorder Patient is contemplative.  We will continue to address this with each visit.  I spent about 6  minutes today counseling patient with regards to the impact that smoking has on the body, including the immune system and increased risk of cancer, stroke and heart attack.  Patient voiced good understanding and is interested in quitting.  Orders Placed This Encounter  Procedures  . Bayer DCA Hb A1c Waived   Meds ordered this encounter  Medications  . Dulaglutide (TRULICITY) 1.5 PJ/2.4NH SOPN    Sig: Inject 1.5 mg into the skin once a week.    Dispense:  12 pen    Refill:  4  . amoxicillin-clavulanate (AUGMENTIN) 875-125 MG tablet    Sig: Take 1 tablet by mouth 2 (two) times daily.    Dispense:  20 tablet    Refill:  Prentiss, DO [place] [phone]

## 2018-05-29 NOTE — Patient Instructions (Addendum)
Health Risks of Smoking Smoking cigarettes is very bad for your health. Tobacco smoke has over 200 known poisons in it. It contains the poisonous gases nitrogen oxide and carbon monoxide. There are over 60 chemicals in tobacco smoke that cause cancer. Smoking is difficult to quit because a chemical in tobacco, called nicotine, causes addiction or dependence. When you smoke and inhale, nicotine is absorbed rapidly into the bloodstream through your lungs. Both inhaled and non-inhaled nicotine may be addictive. What are the risks of cigarette smoke? Cigarette smokers have an increased risk of many serious medical problems, including:  Lung cancer.  Lung disease, such as pneumonia, bronchitis, and emphysema.  Chest pain (angina) and heart attack because the heart is not getting enough oxygen.  Heart disease and peripheral blood vessel disease.  High blood pressure (hypertension).  Stroke.  Oral cancer, including cancer of the lip, mouth, or voice box.  Bladder cancer.  Pancreatic cancer.  Cervical cancer.  Pregnancy complications, including premature birth.  Stillbirths and smaller newborn babies, birth defects, and genetic damage to sperm.  Early menopause.  Lower estrogen level for women.  Infertility.  Facial wrinkles.  Blindness.  Increased risk of broken bones (fractures).  Senile dementia.  Stomach ulcers and internal bleeding.  Delayed wound healing and increased risk of complications during surgery.  Even smoking lightly shortens your life expectancy by several years.  Because of secondhand smoke exposure, children of smokers have an increased risk of the following:  Sudden infant death syndrome (SIDS).  Respiratory infections.  Lung cancer.  Heart disease.  Ear infections.  What are the benefits of quitting? There are many health benefits of quitting smoking. Here are some of them:  Within days of quitting smoking, your risk of having a heart  attack decreases, your blood flow improves, and your lung capacity improves. Blood pressure, pulse rate, and breathing patterns start returning to normal soon after quitting.  Within months, your lungs may clear up completely.  Quitting for 10 years reduces your risk of developing lung cancer and heart disease to almost that of a nonsmoker.  People who quit may see an improvement in their overall quality of life.  How do I quit smoking? Smoking is an addiction with both physical and psychological effects, and longtime habits can be hard to change. Your health care provider can recommend:  Programs and community resources, which may include group support, education, or talk therapy.  Prescription medicines to help reduce cravings.  Nicotine replacement products, such as patches, gum, and nasal sprays. Use these products only as directed. Do not replace cigarette smoking with electronic cigarettes, which are commonly called e-cigarettes. The safety of e-cigarettes is not known, and some may contain harmful chemicals.  A combination of two or more of these methods.  Where to find more information:  American Lung Association: www.lung.org  American Cancer Society: www.cancer.org Summary  Smoking cigarettes is very bad for your health. Cigarette smokers have an increased risk of many serious medical problems, including several cancers, heart disease, and stroke.  Smoking is an addiction with both physical and psychological effects, and longtime habits can be hard to change.  By stopping right away, you can greatly reduce the risk of medical problems for you and your family.  To help you quit smoking, your health care provider can recommend programs, community resources, prescription medicines, and nicotine replacement products such as patches, gum, and nasal sprays. This information is not intended to replace advice given to you by your health  care provider. Make sure you discuss any  questions you have with your health care provider. Document Released: 10/26/2004 Document Revised: 09/22/2016 Document Reviewed: 09/22/2016 Elsevier Interactive Patient Education  2017 [employer]. Otitis Media, Adult Otitis media is redness, soreness, and puffiness (swelling) in the space just behind your eardrum (middle ear). It may be caused by allergies or infection. It often happens along with a cold. Follow these instructions at home:  Take your medicine as told. Finish it even if you start to feel better.  Only take over-the-counter or prescription medicines for pain, discomfort, or fever as told by your doctor.  Follow up with your doctor as told. Contact a doctor if:  You have otitis media only in one ear, or bleeding from your nose, or both.  You notice a lump on your neck.  You are not getting better in 3-5 days.  You feel worse instead of better. Get help right away if:  You have pain that is not helped with medicine.  You have puffiness, redness, or pain around your ear.  You get a stiff neck.  You cannot move part of your face (paralysis).  You notice that the bone behind your ear hurts when you touch it. This information is not intended to replace advice given to you by your health care provider. Make sure you discuss any questions you have with your health care provider. Document Released: 03/06/2008 Document Revised: 02/24/2016 Document Reviewed: 04/15/2013 Elsevier Interactive Patient Education  2017 [employer].

## 2018-05-29 NOTE — Addendum Note (Signed)
Addended by: Rolena Infante on: 05/29/2018 09:51 AM   Modules accepted: Orders

## 2018-06-04 ENCOUNTER — Ambulatory Visit (INDEPENDENT_AMBULATORY_CARE_PROVIDER_SITE_OTHER): Payer: BLUE CROSS/BLUE SHIELD | Admitting: Family Medicine

## 2018-06-04 ENCOUNTER — Ambulatory Visit (INDEPENDENT_AMBULATORY_CARE_PROVIDER_SITE_OTHER): Payer: BLUE CROSS/BLUE SHIELD

## 2018-06-04 ENCOUNTER — Encounter: Payer: Self-pay | Admitting: Family Medicine

## 2018-06-04 VITALS — BP 128/62 | HR 60 | Temp 97.9°F | Ht 59.0 in | Wt 112.4 lb

## 2018-06-04 DIAGNOSIS — S8001XA Contusion of right knee, initial encounter: Secondary | ICD-10-CM

## 2018-06-04 DIAGNOSIS — M79601 Pain in right arm: Secondary | ICD-10-CM

## 2018-06-04 DIAGNOSIS — S0003XA Contusion of scalp, initial encounter: Secondary | ICD-10-CM | POA: Diagnosis not present

## 2018-06-04 NOTE — Patient Instructions (Signed)

## 2018-06-04 NOTE — Progress Notes (Signed)
Subjective:  Patient ID: Lori Schroeder, female    DOB: 30-Nov-1955  Age: 62 y.o. MRN: [id number]  CC: Fall (Yesterday 06/03/18)   HPI Lori Schroeder presents for Tripped over object in floor in aisle at local store. Hit head. Occurred last night at 6 PM. Cut right fifth finger. Landed on Right elbow and knee. Knee sore. Able to walk. Elbow hurting to move. Spouse awakened her every three hours due to head injury. No mental status changes noted. Pt. Denies nausea, vomiting. No LOC.  Depression screen [facility] 2/9 05/29/2018 01/28/2018 01/08/2018  Decreased Interest 0 0 0  Down, Depressed, Hopeless 0 0 0  PHQ - 2 Score 0 0 0    History Lori Schroeder has a past medical history of Blood transfusion without reported diagnosis, External hemorrhoids without mention of complication ([id number]), Family history of malignant neoplasm of gastrointestinal tract, Family history of malignant neoplasm of gastrointestinal tract, GERD (gastroesophageal reflux disease), Hyperlipemia, and Hypertension.   She has a past surgical history that includes Back surgery; Abdominal hysterectomy; Bladder surgery; Colonoscopy; Neck surgery; and Carpal tunnel release (Right).   Her family history includes Colon cancer in her maternal grandmother.She reports that she has been smoking cigarettes. She has a 2.40 pack-year smoking history. She has never used smokeless tobacco. She reports that she does not drink alcohol or use drugs.    ROS Review of Systems  Constitutional: Negative.   HENT: Negative.   Eyes: Negative for visual disturbance.  Respiratory: Negative for shortness of breath.   Cardiovascular: Negative for chest pain.  Gastrointestinal: Negative for abdominal pain.  Musculoskeletal: Negative for arthralgias.    Objective:  BP 128/62   Pulse 60   Temp 97.9 F (36.6 C) (Oral)   Ht 4\' 11"  (1.499 m)   Wt 112 lb 6.4 oz (51 kg)   BMI 22.70 kg/m   BP Readings from Last 3 Encounters:  06/04/18 128/62  05/29/18 128/70    01/28/18 100/70    Wt Readings from Last 3 Encounters:  06/04/18 112 lb 6.4 oz (51 kg)  05/29/18 113 lb (51.3 kg)  01/28/18 123 lb (55.8 kg)     Physical Exam  Constitutional: She is oriented to person, place, and time. She appears well-developed and well-nourished. No distress.  HENT:  Head: Normocephalic and atraumatic.  Nose: Nose normal.  Eyes: Pupils are equal, round, and reactive to light. Conjunctivae and EOM are normal.  Neck: Normal range of motion. Neck supple.  Cardiovascular: Normal rate, regular rhythm and normal heart sounds.  No murmur heard. Pulmonary/Chest: Effort normal and breath sounds normal.  Abdominal: Soft. Bowel sounds are normal.  Musculoskeletal: Normal range of motion.  Right fifth finger has skin avulsion at distal phalanx measuring 4 mm. Tender ROM. Right elbow is tender for ROM. No edema. Contusion distal right knee.   Neurological: She is alert and oriented to person, place, and time. She has normal reflexes.  Skin: Skin is warm and dry.  Psychiatric: She has a normal mood and affect. Her behavior is normal. Judgment and thought content normal.      Assessment & Plan:   Lori Schroeder was seen today for fall.  Diagnoses and all orders for this visit:  Arm pain, diffuse, right -     DG Finger Little Right; Future -     DG Forearm Right; Future  Contusion of right knee, initial encounter  Contusion of occipital region of scalp, initial encounter    I am having Lori Schroeder maintain her pregabalin,  albuterol, esomeprazole, lisinopril, atorvastatin, NUCYNTA, Dulaglutide, and amoxicillin-clavulanate.  Allergies as of 06/04/2018   No Known Allergies     Medication List        Accurate as of 06/04/18 10:44 PM. Always use your most recent med list.          albuterol 108 (90 Base) MCG/ACT inhaler Commonly known as:  PROVENTIL HFA;VENTOLIN HFA Inhale 2 puffs into the lungs every 6 (six) hours as needed for wheezing or shortness of breath.    amoxicillin-clavulanate 875-125 MG tablet Commonly known as:  AUGMENTIN Take 1 tablet by mouth 2 (two) times daily.   atorvastatin 10 MG tablet Commonly known as:  LIPITOR TAKE 1 TABLET DAILY   Dulaglutide 1.5 MG/0.5ML Sopn Inject 1.5 mg into the skin once a week.   esomeprazole 40 MG capsule Commonly known as:  NEXIUM TAKE  (1)  CAPSULE  TWICE DAILY.   lisinopril 5 MG tablet Commonly known as:  PRINIVIL,ZESTRIL Take 1 tablet (5 mg total) by mouth daily.   NUCYNTA 75 MG tablet Generic drug:  tapentadol HCl Take 75 mg by mouth 3 (three) times daily as needed.   pregabalin 300 MG capsule Commonly known as:  LYRICA Take 450 mg by mouth daily.        Follow-up: Return if symptoms worsen or fail to improve.  Claretta Fraise, M.D.

## 2018-06-17 ENCOUNTER — Ambulatory Visit (INDEPENDENT_AMBULATORY_CARE_PROVIDER_SITE_OTHER): Payer: BLUE CROSS/BLUE SHIELD | Admitting: Nurse Practitioner

## 2018-06-17 ENCOUNTER — Encounter: Payer: Self-pay | Admitting: Nurse Practitioner

## 2018-06-17 VITALS — BP 123/64 | HR 56 | Temp 96.9°F | Ht 59.0 in | Wt 116.0 lb

## 2018-06-17 DIAGNOSIS — L03011 Cellulitis of right finger: Secondary | ICD-10-CM | POA: Diagnosis not present

## 2018-06-17 DIAGNOSIS — M24541 Contracture, right hand: Secondary | ICD-10-CM

## 2018-06-17 MED ORDER — SULFAMETHOXAZOLE-TRIMETHOPRIM 800-160 MG PO TABS: 1.0000 | ORAL_TABLET | Freq: Two times a day (BID) | ORAL | 0 refills | Status: DC

## 2018-06-17 NOTE — Progress Notes (Signed)
   Subjective:    Patient ID: Lori Schroeder, female    DOB: Oct 15, 1955, 61 y.o.   MRN: [id number]   Chief Complaint: Recheck finger   HPI Patient comes in today to have finger rechecked. She fell 2 weeks ago and landed on it. She had xray taken and was told there was no fracture. She is still unable to straighten it.    Review of Systems  Respiratory: Negative.   Cardiovascular: Negative.   Gastrointestinal: Negative.   Musculoskeletal: Positive for arthralgias (right fifth finger).  Neurological: Negative.   Psychiatric/Behavioral: Negative.   All other systems reviewed and are negative.      Objective:   Physical Exam  Constitutional: She appears well-developed and well-nourished. No distress.  Cardiovascular: Normal rate and regular rhythm.  Pulmonary/Chest: Effort normal.  Musculoskeletal:  Right fifth finger-contracture of didtal pip join with edema and erythema. Slight  Pain on extension  Neurological: She is alert.  Skin: Skin is warm and dry.  Scabbed over area on medial sideof right 5th finger  Psychiatric: She has a normal mood and affect. Her behavior is normal. Thought content normal.  Nursing note and vitals reviewed.  BP 123/64   Pulse (!) 56   Temp (!) 96.9 F (36.1 C) (Oral)   Ht 4\' 11"  (1.499 m)   Wt 116 lb (52.6 kg)   BMI 23.43 kg/m             Assessment & Plan:  Lori Schroeder in today with chief complaint of Recheck finger   1. Cellulitis of finger of right hand Soak in epsom salt bid - sulfamethoxazole-trimethoprim (BACTRIM DS) 800-160 MG tablet; Take 1 tablet by mouth 2 (two) times daily.  Dispense: 20 tablet; Refill: 0  2. Contracture of joint of finger of right hand Stretch 3-4 x  Day Call if not improving and will do ferral to ortho  Mary-Margaret Hassell Done, FNP

## 2018-07-02 ENCOUNTER — Ambulatory Visit (INDEPENDENT_AMBULATORY_CARE_PROVIDER_SITE_OTHER): Payer: Managed Care, Other (non HMO)

## 2018-07-02 ENCOUNTER — Encounter: Payer: Self-pay | Admitting: Family Medicine

## 2018-07-02 ENCOUNTER — Ambulatory Visit (INDEPENDENT_AMBULATORY_CARE_PROVIDER_SITE_OTHER): Payer: Managed Care, Other (non HMO) | Admitting: Family Medicine

## 2018-07-02 VITALS — BP 104/64 | HR 83 | Temp 97.7°F | Ht 59.0 in | Wt 111.0 lb

## 2018-07-02 DIAGNOSIS — M20011 Mallet finger of right finger(s): Secondary | ICD-10-CM

## 2018-07-02 DIAGNOSIS — S6991XD Unspecified injury of right wrist, hand and finger(s), subsequent encounter: Secondary | ICD-10-CM

## 2018-07-02 NOTE — Progress Notes (Signed)
Subjective: CC: finger injury PCP: [name], DO JQB:HALPF Lori Schroeder is a 62 y.o. female presenting to clinic today for:  1. Finger injury Patient sustained a mechanical fall on 06/03/2018.  She was seen the following day here in office and had x-rays performed of the right little finger.  At that time, there was soft tissue swelling noted around the fifth DIP joint but no acute fractures noted.  She continues to have soft tissue swelling and pain with extension of the right pinky finger.  She was actually seen about a week ago and treated as a cellulitis but she states that this is not improved at all with use of Bactrim.  She is not currently using any anti-inflammatories because she has acid reflux.  She has not been using any ice but has been using the recommended brace.  While overall, soft tissue swelling has greatly improved from initial injury, she is quite worried that she continues to have any swelling this far out from the injury.  She is interested in seeing an orthopedist.  She saw agrees for orthopedics for her back previously would like to be referred there if possible.   ROS: Per HPI  No Known Allergies Past Medical History:  Diagnosis Date  . Blood transfusion without reported diagnosis   . External hemorrhoids without mention of complication [id number]   Colonoscopy  . Family history of malignant neoplasm of gastrointestinal tract   . Family history of malignant neoplasm of gastrointestinal tract   . GERD (gastroesophageal reflux disease)   . Hyperlipemia   . Hypertension     Current Outpatient Medications:  .  albuterol (PROVENTIL HFA;VENTOLIN HFA) 108 (90 Base) MCG/ACT inhaler, Inhale 2 puffs into the lungs every 6 (six) hours as needed for wheezing or shortness of breath., Disp: 1 Inhaler, Rfl: 2 .  atorvastatin (LIPITOR) 10 MG tablet, TAKE 1 TABLET DAILY, Disp: 90 tablet, Rfl: 1 .  Dulaglutide (TRULICITY) 1.5 BD/5.3GD SOPN, Inject 1.5 mg into the skin once a  week., Disp: 12 pen, Rfl: 4 .  esomeprazole (NEXIUM) 40 MG capsule, TAKE  (1)  CAPSULE  TWICE DAILY., Disp: 180 capsule, Rfl: 1 .  lisinopril (PRINIVIL,ZESTRIL) 5 MG tablet, Take 1 tablet (5 mg total) by mouth daily., Disp: 90 tablet, Rfl: 3 .  NUCYNTA 75 MG tablet, Take 75 mg by mouth 3 (three) times daily as needed. , Disp: , Rfl: 0 .  pregabalin (LYRICA) 300 MG capsule, Take 450 mg by mouth daily. , Disp: , Rfl:  Social History   Socioeconomic History  . Marital status: Married    Spouse name: Not on file  . Number of children: 2  . Years of education: Not on file  . Highest education level: Not on file  Occupational History  . Occupation: disability    Employer: DISABLED  Social Needs  . Financial resource strain: Not on file  . Food insecurity:    Worry: Not on file    Inability: Not on file  . Transportation needs:    Medical: Not on file    Non-medical: Not on file  Tobacco Use  . Smoking status: Current Every Day Smoker    Packs/day: 0.30    Years: 8.00    Pack years: 2.40    Types: Cigarettes  . Smokeless tobacco: Never Used  Substance and Sexual Activity  . Alcohol use: No  . Drug use: No  . Sexual activity: Not on file  Lifestyle  . Physical activity:  Days per week: Not on file    Minutes per session: Not on file  . Stress: Not on file  Relationships  . Social connections:    Talks on phone: Not on file    Gets together: Not on file    Attends religious service: Not on file    Active member of club or organization: Not on file    Attends meetings of clubs or organizations: Not on file    Relationship status: Not on file  . Intimate partner violence:    Fear of current or ex partner: Not on file    Emotionally abused: Not on file    Physically abused: Not on file    Forced sexual activity: Not on file  Other Topics Concern  . Not on file  Social History Narrative  . Not on file   Family History  Problem Relation Age of Onset  . Colon cancer  Maternal Grandmother   . Breast cancer Neg Hx   . Celiac disease Neg Hx   . Cirrhosis Neg Hx   . Clotting disorder Neg Hx   . Colitis Neg Hx   . Colon polyps Neg Hx   . Crohn's disease Neg Hx   . Cystic fibrosis Neg Hx   . Diabetes Neg Hx   . Esophageal cancer Neg Hx   . Heart disease Neg Hx   . Hemochromatosis Neg Hx   . Inflammatory bowel disease Neg Hx   . Irritable bowel syndrome Neg Hx   . Kidney disease Neg Hx   . Liver cancer Neg Hx   . Liver disease Neg Hx   . Ovarian cancer Neg Hx   . Pancreatic cancer Neg Hx   . Prostate cancer Neg Hx   . Rectal cancer Neg Hx   . Stomach cancer Neg Hx   . Ulcerative colitis Neg Hx   . Uterine cancer Neg Hx   . Wilson's disease Neg Hx     Objective: Office vital signs reviewed. BP 104/64   Pulse 83   Temp 97.7 F (36.5 C) (Oral)   Ht 4\' 11"  (1.499 m)   Wt 111 lb (50.3 kg)   BMI 22.42 kg/m   Physical Examination:  General: Awake, alert, well nourished, No acute distress Extremities: warm, well perfused, No edema, cyanosis or clubbing; +2 pulses bilaterally MSK:   Right hand: Patient has a visible mallet deformity in the distal interphalangeal joint of the right pinky finger.  There is visible soft tissue swelling over that joint.  I am unable to passively extend the finger at the DIP without causing pain.  There is no substantial erythema.  She does have tenderness to palpation over the DIP. Neuro: light touch sensation grossly in tact  Dg Finger Little Right  Result Date: 07/02/2018 CLINICAL DATA:  Injured finger 06/03/18;unable to extend rt little finger EXAM: RIGHT LITTLE FINGER 2+V COMPARISON:  06/04/2018 FINDINGS: No fracture or dislocation. DIP joint of the fifth finger remains flexed with slight hyperextension at the PIP joint. Soft tissue swelling noted previously has mildly improved. Remaining joints normally spaced and aligned. IMPRESSION: 1. Findings consistent with tendon injury to the DIP joint of the right fifth  finger. DIP joint is in flexion. This may be due to disruption extensor tendon. This could be further assessed with MRI. 2. No fracture or dislocation. Electronically Signed   By: Lajean Manes M.D.   On: 07/02/2018 13:44    Assessment/ Plan: 62 y.o. female   1.  Finger injury, right, subsequent encounter Physical exam concerning for Mallet finger.  X-ray was repeated today.  I agree that her continued joint swelling and visible deformity is concerning and have placed the referral to the orthopedic office of her request.  I recommended icing the affected area and continue use of brace for now.  Will await orthopedic input.  Patient to follow-up PRN or as scheduled for chronic medical needs. - Ambulatory referral to Orthopedic Surgery - DG Finger Little Right; Future  2. Mallet deformity of right little finger X-ray results are in and it appears that she does have disruption of the extensor tendon in the right pinky finger.  Referral to orthopedics has been placed.  Results were discussed with the patient over the phone.    Orders Placed This Encounter  Procedures  . DG Finger Little Right    Standing Status:   Future    Standing Expiration Date:   09/01/2019    Order Specific Question:   Reason for Exam (SYMPTOM  OR DIAGNOSIS REQUIRED)    Answer:   injury/ fall 1 month ago.  cannot extend pinky finger.  continues to have joint swelling.    Order Specific Question:   Preferred imaging location?    Answer:   Internal  . Ambulatory referral to Orthopedic Surgery    Referral Priority:   Routine    Referral Type:   Surgical    Referral Reason:   Specialty Services Required    Requested Specialty:   Orthopedic Surgery    Number of Visits Requested:   [place], [place] [phone]

## 2018-07-15 DIAGNOSIS — M79641 Pain in right hand: Secondary | ICD-10-CM | POA: Insufficient documentation

## 2018-07-29 DIAGNOSIS — Z5181 Encounter for therapeutic drug level monitoring: Secondary | ICD-10-CM | POA: Diagnosis not present

## 2018-07-29 DIAGNOSIS — M47816 Spondylosis without myelopathy or radiculopathy, lumbar region: Secondary | ICD-10-CM | POA: Diagnosis not present

## 2018-07-29 DIAGNOSIS — Z79899 Other long term (current) drug therapy: Secondary | ICD-10-CM | POA: Diagnosis not present

## 2018-07-29 DIAGNOSIS — G894 Chronic pain syndrome: Secondary | ICD-10-CM | POA: Diagnosis not present

## 2018-07-29 DIAGNOSIS — M961 Postlaminectomy syndrome, not elsewhere classified: Secondary | ICD-10-CM | POA: Diagnosis not present

## 2018-08-05 DIAGNOSIS — M47817 Spondylosis without myelopathy or radiculopathy, lumbosacral region: Secondary | ICD-10-CM | POA: Diagnosis not present

## 2018-08-08 DIAGNOSIS — M65321 Trigger finger, right index finger: Secondary | ICD-10-CM | POA: Insufficient documentation

## 2018-08-08 DIAGNOSIS — M20011 Mallet finger of right finger(s): Secondary | ICD-10-CM | POA: Diagnosis not present

## 2018-08-27 ENCOUNTER — Ambulatory Visit (INDEPENDENT_AMBULATORY_CARE_PROVIDER_SITE_OTHER): Payer: Managed Care, Other (non HMO) | Admitting: Family Medicine

## 2018-08-27 ENCOUNTER — Encounter: Payer: Self-pay | Admitting: Family Medicine

## 2018-08-27 VITALS — BP 136/70 | HR 66 | Temp 97.1°F | Ht 59.0 in | Wt 116.0 lb

## 2018-08-27 DIAGNOSIS — J069 Acute upper respiratory infection, unspecified: Secondary | ICD-10-CM | POA: Diagnosis not present

## 2018-08-27 DIAGNOSIS — J0100 Acute maxillary sinusitis, unspecified: Secondary | ICD-10-CM

## 2018-08-27 DIAGNOSIS — H6501 Acute serous otitis media, right ear: Secondary | ICD-10-CM

## 2018-08-27 MED ORDER — AMOXICILLIN-POT CLAVULANATE 875-125 MG PO TABS: 1.0000 | ORAL_TABLET | Freq: Two times a day (BID) | ORAL | 0 refills | Status: AC

## 2018-08-27 MED ORDER — METHYLPREDNISOLONE ACETATE 80 MG/ML IJ SUSP
80.0000 mg | Freq: Once | INTRAMUSCULAR | Status: AC
  Administered 2018-08-27: 80 mg via INTRAMUSCULAR

## 2018-08-27 NOTE — Progress Notes (Signed)
Subjective:    Patient ID: Lori Schroeder, female    DOB: 1956-03-12, 62 y.o.   MRN: [id number]  Chief Complaint:  Sinusitis (Nasal congestion and drainage, ears full, cough; symptoms began 4 days ago)   HPI: Lori Schroeder is a 62 y.o. female presenting on 08/27/2018 for Sinusitis (Nasal congestion and drainage, ears full, cough; symptoms began 4 days ago)  Pt presents today with sinus pressure, congestion, ear pressure, rhinorrhea, productive cough, fever, chills, and fatigue that started 6 days ago and became worse 4 days ago. States the symptoms were intermittent at first, but now they are constant and getting worse. States she has been taking Mucinex with minimal relief of her symptoms. Reports a productive cough with thick brownish-yellow sputum. She states her right ear hurts and her left ear has been popping.   Relevant past medical, surgical, family, and social history reviewed and updated as indicated.  Allergies and medications reviewed and updated.   Past Medical History:  Diagnosis Date  . Blood transfusion without reported diagnosis   . External hemorrhoids without mention of complication [id number]   Colonoscopy  . Family history of malignant neoplasm of gastrointestinal tract   . Family history of malignant neoplasm of gastrointestinal tract   . GERD (gastroesophageal reflux disease)   . Hyperlipemia   . Hypertension     Past Surgical History:  Procedure Laterality Date  . ABDOMINAL HYSTERECTOMY    . BACK SURGERY    . BLADDER SURGERY     Bladder Tact  . CARPAL TUNNEL RELEASE Right   . COLONOSCOPY    . NECK SURGERY      Social History   Socioeconomic History  . Marital status: Married    Spouse name: Not on file  . Number of children: 2  . Years of education: Not on file  . Highest education level: Not on file  Occupational History  . Occupation: disability    Employer: DISABLED  Social Needs  . Financial resource strain: Not on file  . Food  insecurity:    Worry: Not on file    Inability: Not on file  . Transportation needs:    Medical: Not on file    Non-medical: Not on file  Tobacco Use  . Smoking status: Current Every Day Smoker    Packs/day: 0.30    Years: 8.00    Pack years: 2.40    Types: Cigarettes  . Smokeless tobacco: Never Used  Substance and Sexual Activity  . Alcohol use: No  . Drug use: No  . Sexual activity: Not on file  Lifestyle  . Physical activity:    Days per week: Not on file    Minutes per session: Not on file  . Stress: Not on file  Relationships  . Social connections:    Talks on phone: Not on file    Gets together: Not on file    Attends religious service: Not on file    Active member of club or organization: Not on file    Attends meetings of clubs or organizations: Not on file    Relationship status: Not on file  . Intimate partner violence:    Fear of current or ex partner: Not on file    Emotionally abused: Not on file    Physically abused: Not on file    Forced sexual activity: Not on file  Other Topics Concern  . Not on file  Social History Narrative  . Not on file  Outpatient Encounter Medications as of 08/27/2018  Medication Sig  . albuterol (PROVENTIL HFA;VENTOLIN HFA) 108 (90 Base) MCG/ACT inhaler Inhale 2 puffs into the lungs every 6 (six) hours as needed for wheezing or shortness of breath.  Marland Kitchen atorvastatin (LIPITOR) 10 MG tablet TAKE 1 TABLET DAILY  . Dulaglutide (TRULICITY) 1.5 TM/5.4YT SOPN Inject 1.5 mg into the skin once a week.  . esomeprazole (NEXIUM) 40 MG capsule TAKE  (1)  CAPSULE  TWICE DAILY.  Marland Kitchen lisinopril (PRINIVIL,ZESTRIL) 5 MG tablet Take 1 tablet (5 mg total) by mouth daily.  . NUCYNTA 75 MG tablet Take 75 mg by mouth 3 (three) times daily as needed.   . pregabalin (LYRICA) 300 MG capsule Take 450 mg by mouth daily.   Marland Kitchen amoxicillin-clavulanate (AUGMENTIN) 875-125 MG tablet Take 1 tablet by mouth 2 (two) times daily for 7 days.   Facility-Administered  Encounter Medications as of 08/27/2018  Medication  . methylPREDNISolone acetate (DEPO-MEDROL) injection 80 mg    No Known Allergies  Review of Systems  Constitutional: Positive for chills, fatigue and fever.  HENT: Positive for congestion, ear pain, postnasal drip, rhinorrhea, sinus pressure, sinus pain and sore throat.   Eyes: Negative for photophobia and visual disturbance.  Respiratory: Positive for cough and wheezing. Negative for shortness of breath.   Cardiovascular: Negative for chest pain and palpitations.  Gastrointestinal: Negative for abdominal pain, constipation, diarrhea, nausea and vomiting.  Neurological: Positive for headaches. Negative for dizziness, weakness and light-headedness.  Psychiatric/Behavioral: Negative for confusion.  All other systems reviewed and are negative.       Objective:    BP 136/70   Pulse 66   Temp (!) 97.1 F (36.2 C) (Oral)   Ht 4\' 11"  (1.499 m)   Wt 116 lb (52.6 kg)   BMI 23.43 kg/m    Wt Readings from Last 3 Encounters:  08/27/18 116 lb (52.6 kg)  07/02/18 111 lb (50.3 kg)  06/17/18 116 lb (52.6 kg)    Physical Exam  Constitutional: She is oriented to person, place, and time. She appears well-developed and well-nourished. She is cooperative. She appears distressed (mild).  HENT:  Head: Normocephalic and atraumatic.  Right Ear: Hearing, external ear and ear canal normal. Tympanic membrane is erythematous and bulging. Tympanic membrane is not perforated.  Left Ear: Hearing, external ear and ear canal normal. Tympanic membrane is not injected, not perforated and not erythematous. A middle ear effusion is present.  Nose: Mucosal edema and rhinorrhea present. Right sinus exhibits maxillary sinus tenderness. Right sinus exhibits no frontal sinus tenderness. Left sinus exhibits maxillary sinus tenderness. Left sinus exhibits no frontal sinus tenderness.  Mouth/Throat: Uvula is midline and mucous membranes are normal. Posterior  oropharyngeal erythema present. No oropharyngeal exudate, posterior oropharyngeal edema or tonsillar abscesses. No tonsillar exudate.  Eyes: Pupils are equal, round, and reactive to light. Conjunctivae and lids are normal.  Neck: Trachea normal and phonation normal. Neck supple. No JVD present. No thyroid mass and no thyromegaly present.  Cardiovascular: Normal rate, regular rhythm, S1 normal, S2 normal and normal heart sounds. Exam reveals no gallop and no friction rub.  No murmur heard. Pulmonary/Chest: Effort normal. No respiratory distress. She has wheezes (mild) in the right lower field and the left lower field.  Lymphadenopathy:       Head (right side): Submandibular adenopathy present.       Head (left side): Submandibular adenopathy present.  Neurological: She is alert and oriented to person, place, and time.  Skin: Skin is  warm and dry. Capillary refill takes less than 2 seconds.  Psychiatric: She has a normal mood and affect. Her speech is normal and behavior is normal. Judgment and thought content normal. Cognition and memory are normal.    Results for orders placed or performed in visit on 06/18/18  HM MAMMOGRAPHY  Result Value Ref Range   HM Mammogram 0-4 Bi-Rad 0-4 Bi-Rad, Self Reported Normal       Pertinent labs & imaging results that were available during my care of the patient were reviewed by me and considered in my medical decision making.  Assessment & Plan:  Lori Schroeder was seen today for sinusitis.  Diagnoses and all orders for this visit:  Acute non-recurrent maxillary sinusitis Increase fluid intake. Flonase daily. Frequent saline nasal sprays. Avoid cigarette smoke. Medications as prescribed.  -     amoxicillin-clavulanate (AUGMENTIN) 875-125 MG tablet; Take 1 tablet by mouth 2 (two) times daily for 7 days. -     methylPREDNISolone acetate (DEPO-MEDROL) injection 80 mg  Non-recurrent acute serous otitis media of right ear Avoid cigarette smoke. Tylenol or motrin  as needed for fever and pain control. Medications as prescribed.  -     amoxicillin-clavulanate (AUGMENTIN) 875-125 MG tablet; Take 1 tablet by mouth 2 (two) times daily for 7 days.  Upper respiratory infection with cough and congestion Increase humidity in the air. Increase water intake. Continue Mucinex as needed. Return if symptoms worsen or fail to improve.  -     amoxicillin-clavulanate (AUGMENTIN) 875-125 MG tablet; Take 1 tablet by mouth 2 (two) times daily for 7 days. -     methylPREDNISolone acetate (DEPO-MEDROL) injection 80 mg      Continue all other maintenance medications.  Follow up plan: Return if symptoms worsen or fail to improve.  Educational handout given for sinusitis, otitis media  The above assessment and management plan was discussed with the patient. The patient verbalized understanding of and has agreed to the management plan. Patient is aware to call the clinic if symptoms persist or worsen. Patient is aware when to return to the clinic for a follow-up visit. Patient educated on when it is appropriate to go to the emergency department.   Monia Pouch, FNP-C [facility] [phone]

## 2018-08-27 NOTE — Patient Instructions (Signed)
Sinusitis, Adult Sinusitis is soreness and inflammation of your sinuses. Sinuses are hollow spaces in the bones around your face. They are located:  Around your eyes.  In the middle of your forehead.  Behind your nose.  In your cheekbones.  Your sinuses and nasal passages are lined with a stringy fluid (mucus). Mucus normally drains out of your sinuses. When your nasal tissues get inflamed or swollen, the mucus can get trapped or blocked so air cannot flow through your sinuses. This lets bacteria, viruses, and funguses grow, and that leads to infection. Follow these instructions at home: Medicines  Take, use, or apply over-the-counter and prescription medicines only as told by your doctor. These may include nasal sprays.  If you were prescribed an antibiotic medicine, take it as told by your doctor. Do not stop taking the antibiotic even if you start to feel better. Hydrate and Humidify  Drink enough water to keep your pee (urine) clear or pale yellow.  Use a cool mist humidifier to keep the humidity level in your home above 50%.  Breathe in steam for 10-15 minutes, 3-4 times a day or as told by your doctor. You can do this in the bathroom while a hot shower is running.  Try not to spend time in cool or dry air. Rest  Rest as much as possible.  Sleep with your head raised (elevated).  Make sure to get enough sleep each night. General instructions  Put a warm, moist washcloth on your face 3-4 times a day or as told by your doctor. This will help with discomfort.  Wash your hands often with soap and water. If there is no soap and water, use hand sanitizer.  Do not smoke. Avoid being around people who are smoking (secondhand smoke).  Keep all follow-up visits as told by your doctor. This is important. Contact a doctor if:  You have a fever.  Your symptoms get worse.  Your symptoms do not get better within 10 days. Get help right away if:  You have a very bad  headache.  You cannot stop throwing up (vomiting).  You have pain or swelling around your face or eyes.  You have trouble seeing.  You feel confused.  Your neck is stiff.  You have trouble breathing. This information is not intended to replace advice given to you by your health care provider. Make sure you discuss any questions you have with your health care provider. Document Released: 03/06/2008 Document Revised: 05/14/2016 Document Reviewed: 07/14/2015 Elsevier Interactive Patient Education  2018 [employer]. Otitis Media, Adult Otitis media is redness, soreness, and puffiness (swelling) in the space just behind your eardrum (middle ear). It may be caused by allergies or infection. It often happens along with a cold. Follow these instructions at home:  Take your medicine as told. Finish it even if you start to feel better.  Only take over-the-counter or prescription medicines for pain, discomfort, or fever as told by your doctor.  Follow up with your doctor as told. Contact a doctor if:  You have otitis media only in one ear, or bleeding from your nose, or both.  You notice a lump on your neck.  You are not getting better in 3-5 days.  You feel worse instead of better. Get help right away if:  You have pain that is not helped with medicine.  You have puffiness, redness, or pain around your ear.  You get a stiff neck.  You cannot move part of your face (  paralysis).  You notice that the bone behind your ear hurts when you touch it. This information is not intended to replace advice given to you by your health care provider. Make sure you discuss any questions you have with your health care provider. Document Released: 03/06/2008 Document Revised: 02/24/2016 Document Reviewed: 04/15/2013 Elsevier Interactive Patient Education  2017 [employer].

## 2018-09-02 ENCOUNTER — Other Ambulatory Visit: Payer: Self-pay | Admitting: Physician Assistant

## 2018-09-02 DIAGNOSIS — E785 Hyperlipidemia, unspecified: Secondary | ICD-10-CM

## 2018-09-09 ENCOUNTER — Ambulatory Visit (INDEPENDENT_AMBULATORY_CARE_PROVIDER_SITE_OTHER): Payer: Managed Care, Other (non HMO) | Admitting: Family Medicine

## 2018-09-09 ENCOUNTER — Encounter: Payer: Self-pay | Admitting: Family Medicine

## 2018-09-09 VITALS — BP 117/59 | HR 62 | Temp 98.1°F | Ht 59.0 in | Wt 114.0 lb

## 2018-09-09 DIAGNOSIS — E785 Hyperlipidemia, unspecified: Secondary | ICD-10-CM

## 2018-09-09 DIAGNOSIS — H66001 Acute suppurative otitis media without spontaneous rupture of ear drum, right ear: Secondary | ICD-10-CM

## 2018-09-09 DIAGNOSIS — E1165 Type 2 diabetes mellitus with hyperglycemia: Secondary | ICD-10-CM | POA: Diagnosis not present

## 2018-09-09 DIAGNOSIS — I10 Essential (primary) hypertension: Secondary | ICD-10-CM | POA: Diagnosis not present

## 2018-09-09 DIAGNOSIS — E1169 Type 2 diabetes mellitus with other specified complication: Secondary | ICD-10-CM | POA: Diagnosis not present

## 2018-09-09 DIAGNOSIS — I152 Hypertension secondary to endocrine disorders: Secondary | ICD-10-CM

## 2018-09-09 DIAGNOSIS — E1159 Type 2 diabetes mellitus with other circulatory complications: Secondary | ICD-10-CM

## 2018-09-09 LAB — BAYER DCA HB A1C WAIVED: HB A1C (BAYER DCA - WAIVED): 6.3 % (ref <7.0)

## 2018-09-09 MED ORDER — CIPROFLOXACIN-DEXAMETHASONE 0.3-0.1 % OT SUSP: 4.0000 [drp] | Freq: Two times a day (BID) | OTIC | 0 refills | Status: AC

## 2018-09-09 NOTE — Progress Notes (Signed)
Subjective: CC: DM PCP: [name], DO UKG:URKYH Cercone is a 62 y.o. female presenting to clinic today for:  1. Type 2 Diabetes w/ HTN and HLD Patient reports High at home: 140-150 ; Low at home: 104, Taking medication(s): Trulicity 1.5 sub q every week, Lipitor 10mg  daily and Lisinopril 5mg  daily. Side effects: none  Last eye exam: Oct 2018. Sees Dr Marin Comment. Has not seen her this year Last foot exam: 12/2017 Last A1c: 05/2018 and was 6.3 Nephropathy screen indicated?: on Lisinopril 5mg  Last flu, zoster and/or pneumovax: UTD  ROS: No LOC, polyuria, polydipsia, unintended weight loss/gain, foot ulcerations, numbness or tingling in extremities or chest pain.  2. Tobacco use disorder Patient reports smoking three quarters of a pack to 1 pack/day for a total of 19 years.  She was able to totally stop smoking for about 22 years but then resumed smoking about 11 years ago.  No hemoptysis, unplanned weight loss or shortness of breath.    3.  Ear pain Patient reports persistent right-sided ear pain, despite recent treatment with Augmentin for sinus infection.  Denies any drainage.  She has a surgery coming up on Wednesday and she is worried about this.  No fevers.   No Known Allergies Past Medical History:  Diagnosis Date  . Blood transfusion without reported diagnosis   . External hemorrhoids without mention of complication [id number]   Colonoscopy  . Family history of malignant neoplasm of gastrointestinal tract   . Family history of malignant neoplasm of gastrointestinal tract   . GERD (gastroesophageal reflux disease)   . Hyperlipemia   . Hypertension     Current Outpatient Medications:  .  albuterol (PROVENTIL HFA;VENTOLIN HFA) 108 (90 Base) MCG/ACT inhaler, Inhale 2 puffs into the lungs every 6 (six) hours as needed for wheezing or shortness of breath., Disp: 1 Inhaler, Rfl: 2 .  atorvastatin (LIPITOR) 10 MG tablet, TAKE 1 TABLET DAILY, Disp: 90 tablet, Rfl: 1 .  Dulaglutide  (TRULICITY) 1.5 GB/1.5VV SOPN, Inject 1.5 mg into the skin once a week., Disp: 12 pen, Rfl: 4 .  esomeprazole (NEXIUM) 40 MG capsule, TAKE  (1)  CAPSULE  TWICE DAILY., Disp: 180 capsule, Rfl: 1 .  lisinopril (PRINIVIL,ZESTRIL) 5 MG tablet, Take 1 tablet (5 mg total) by mouth daily., Disp: 90 tablet, Rfl: 3 .  NUCYNTA 75 MG tablet, Take 75 mg by mouth 3 (three) times daily as needed. , Disp: , Rfl: 0 .  pregabalin (LYRICA) 300 MG capsule, Take 450 mg by mouth daily. , Disp: , Rfl:   Social History   Socioeconomic History  . Marital status: Married    Spouse name: Not on file  . Number of children: 2  . Years of education: Not on file  . Highest education level: Not on file  Occupational History  . Occupation: disability    Employer: DISABLED  Social Needs  . Financial resource strain: Not on file  . Food insecurity:    Worry: Not on file    Inability: Not on file  . Transportation needs:    Medical: Not on file    Non-medical: Not on file  Tobacco Use  . Smoking status: Current Every Day Smoker    Packs/day: 0.30    Years: 8.00    Pack years: 2.40    Types: Cigarettes  . Smokeless tobacco: Never Used  Substance and Sexual Activity  . Alcohol use: No  . Drug use: No  . Sexual activity: Not on file  Lifestyle  . Physical activity:    Days per week: Not on file    Minutes per session: Not on file  . Stress: Not on file  Relationships  . Social connections:    Talks on phone: Not on file    Gets together: Not on file    Attends religious service: Not on file    Active member of club or organization: Not on file    Attends meetings of clubs or organizations: Not on file    Relationship status: Not on file  . Intimate partner violence:    Fear of current or ex partner: Not on file    Emotionally abused: Not on file    Physically abused: Not on file    Forced sexual activity: Not on file  Other Topics Concern  . Not on file  Social History Narrative  . Not on file    Family History  Problem Relation Age of Onset  . Colon cancer Maternal Grandmother   . Breast cancer Neg Hx   . Celiac disease Neg Hx   . Cirrhosis Neg Hx   . Clotting disorder Neg Hx   . Colitis Neg Hx   . Colon polyps Neg Hx   . Crohn's disease Neg Hx   . Cystic fibrosis Neg Hx   . Diabetes Neg Hx   . Esophageal cancer Neg Hx   . Heart disease Neg Hx   . Hemochromatosis Neg Hx   . Inflammatory bowel disease Neg Hx   . Irritable bowel syndrome Neg Hx   . Kidney disease Neg Hx   . Liver cancer Neg Hx   . Liver disease Neg Hx   . Ovarian cancer Neg Hx   . Pancreatic cancer Neg Hx   . Prostate cancer Neg Hx   . Rectal cancer Neg Hx   . Stomach cancer Neg Hx   . Ulcerative colitis Neg Hx   . Uterine cancer Neg Hx   . Wilson's disease Neg Hx     Objective: Office vital signs reviewed. BP (!) 117/59   Pulse 62   Temp 98.1 F (36.7 C) (Oral)   Ht 4\' 11"  (1.499 m)   Wt 114 lb (51.7 kg)   BMI 23.03 kg/m   Physical Examination:  General: Awake, alert, nontoxic, No acute distress HEENT: Normal    Neck: No masses palpated. No lymphadenopathy    Ears: Tympanic membranes intact, dulled light reflex on right w/inflammation of TM, mild erythema.    Eyes: PERRLA, extraocular membranes intact, sclera white    Nose: nasal turbinates moist, clear nasal discharge    Throat: moist mucus membranes, no erythema, no tonsillar exudate.  Airway is patent Cardio: regular rate and rhythm, S1S2 heard, no murmurs appreciated Pulm: Global expiratory wheezes w/ good air movement. No rhonchi or rales; normal work of breathing on room air  Assessment/ Plan: 62 y.o. female   1. Type 2 diabetes mellitus with hyperglycemia, without long-term current use of insulin (HCC) Well-controlled.  Her A1c was 6.3 today.  No changes in medication.  She is to schedule her diabetic eye exam with Dr. Truman Hayward and have this record sent to me.  We discussed that she is to continue lisinopril 5 mg daily for blood  pressure control and renal protection given type 2 diabetes.  We did discuss that if her blood pressure drops below today's blood pressures, plan to decrease lisinopril to 2.5 mg daily.  She voiced good understanding. - Bayer DCA Hb A1c  Waived  2. Hypertension associated with diabetes ([place]) Controlled.  May need to back Lisinopril down to 2.5mg  at some point.  3. Hyperlipidemia associated with type 2 diabetes mellitus ([place]) Continue statin  4. Acute suppurative otitis media of right ear without spontaneous rupture of tympanic membrane, recurrence not specified Ciprodex rx'd BID x7 days.  Return precautions discussed.  If no improvement, next step referral to ENT.    Orders Placed This Encounter  Procedures  . Bayer DCA Hb A1c Waived   Meds ordered this encounter  Medications  . ciprofloxacin-dexamethasone (CIPRODEX) OTIC suspension    Sig: Place 4 drops into the right ear 2 (two) times daily for 7 days.    Dispense:  7.5 mL    Refill:  0     Generoso Cropper Windell Moulding, DO [place] [phone]

## 2018-09-09 NOTE — Patient Instructions (Signed)
I have added Ciprodex ear drops to use 2 times a day x7 days in the right ear.  Ok to hold your medications day of surgery.  Diabetes is under excellent control today w/ A1c 6.3.  Keep up the good work.

## 2018-09-11 DIAGNOSIS — M20011 Mallet finger of right finger(s): Secondary | ICD-10-CM | POA: Diagnosis not present

## 2018-09-11 DIAGNOSIS — M65321 Trigger finger, right index finger: Secondary | ICD-10-CM | POA: Diagnosis not present

## 2018-09-23 DIAGNOSIS — M25641 Stiffness of right hand, not elsewhere classified: Secondary | ICD-10-CM | POA: Diagnosis not present

## 2018-09-23 DIAGNOSIS — M65321 Trigger finger, right index finger: Secondary | ICD-10-CM | POA: Diagnosis not present

## 2018-09-23 DIAGNOSIS — Z5189 Encounter for other specified aftercare: Secondary | ICD-10-CM | POA: Diagnosis not present

## 2018-09-23 DIAGNOSIS — M20011 Mallet finger of right finger(s): Secondary | ICD-10-CM | POA: Diagnosis not present

## 2018-10-17 DIAGNOSIS — M65321 Trigger finger, right index finger: Secondary | ICD-10-CM | POA: Diagnosis not present

## 2018-10-17 DIAGNOSIS — Z5189 Encounter for other specified aftercare: Secondary | ICD-10-CM | POA: Diagnosis not present

## 2018-10-17 DIAGNOSIS — M20011 Mallet finger of right finger(s): Secondary | ICD-10-CM | POA: Diagnosis not present

## 2018-10-31 ENCOUNTER — Ambulatory Visit (INDEPENDENT_AMBULATORY_CARE_PROVIDER_SITE_OTHER): Payer: Managed Care, Other (non HMO) | Admitting: Nurse Practitioner

## 2018-10-31 ENCOUNTER — Encounter: Payer: Self-pay | Admitting: Nurse Practitioner

## 2018-10-31 VITALS — BP 79/54 | HR 97 | Temp 98.0°F | Ht 59.0 in | Wt 110.0 lb

## 2018-10-31 DIAGNOSIS — J0100 Acute maxillary sinusitis, unspecified: Secondary | ICD-10-CM | POA: Diagnosis not present

## 2018-10-31 DIAGNOSIS — R52 Pain, unspecified: Secondary | ICD-10-CM | POA: Diagnosis not present

## 2018-10-31 LAB — VERITOR FLU A/B WAIVED
Influenza A: NEGATIVE
Influenza B: NEGATIVE

## 2018-10-31 MED ORDER — AMOXICILLIN-POT CLAVULANATE 875-125 MG PO TABS: 1.0000 | ORAL_TABLET | Freq: Two times a day (BID) | ORAL | 0 refills | Status: DC

## 2018-10-31 NOTE — Progress Notes (Signed)
Subjective:    Patient ID: Lori Schroeder, female    DOB: 06/04/1956, 63 y.o.   MRN: [id number]   Chief Complaint: Nasal Congestion; Generalized Body Aches; and Fever   HPI Patient comes in today c/o body aches, cough, sob and headache. This started on Monday. Has had low grade fever. Has constant chills.     Review of Systems  Constitutional: Positive for chills and fever.  HENT: Positive for congestion, ear pain, rhinorrhea, sinus pressure, sinus pain and sore throat. Negative for trouble swallowing.   Respiratory: Positive for cough and shortness of breath.   Cardiovascular: Negative.   Genitourinary: Negative.   Musculoskeletal: Positive for myalgias.  Neurological: Positive for headaches.  Psychiatric/Behavioral: Negative.   All other systems reviewed and are negative.      Objective:   Physical Exam Constitutional:      General: She is in acute distress (mild).     Appearance: Normal appearance. She is normal weight.  HENT:     Right Ear: Hearing, tympanic membrane, ear canal and external ear normal.     Left Ear: Hearing, tympanic membrane, ear canal and external ear normal.     Nose: Mucosal edema, congestion and rhinorrhea present.     Right Sinus: Maxillary sinus tenderness present. No frontal sinus tenderness.     Left Sinus: Maxillary sinus tenderness present. No frontal sinus tenderness.     Mouth/Throat:     Lips: Pink.     Mouth: Mucous membranes are moist.     Pharynx: Oropharynx is clear. Uvula midline.  Neck:     Musculoskeletal: Normal range of motion.  Cardiovascular:     Rate and Rhythm: Normal rate and regular rhythm.     Heart sounds: Normal heart sounds.  Pulmonary:     Effort: Pulmonary effort is normal.     Breath sounds: Normal breath sounds.     Comments: Dry cough Skin:    General: Skin is warm and dry.  Neurological:     General: No focal deficit present.     Mental Status: She is alert and oriented to person, place, and time.    Psychiatric:        Mood and Affect: Mood normal.        Behavior: Behavior normal.    BP (!) 79/54   Pulse 97   Temp 98 F (36.7 C) (Oral)   Ht 4\' 11"  (1.499 m)   Wt 110 lb (49.9 kg)   BMI 22.22 kg/m    Flu negative     Assessment & Plan:  Lori Schroeder in today with chief complaint of Nasal Congestion; Generalized Body Aches; and Fever   1. Body aches - Veritor Flu A/B Waived  2. Acute non-recurrent maxillary sinusitis 1. Take meds as prescribed 2. Use a cool mist humidifier especially during the winter months and when heat has been humid. 3. Use saline nose sprays frequently 4. Saline irrigations of the nose can be very helpful if done frequently.  * 4X daily for 1 week*  * Use of a nettie pot can be helpful with this. Follow directions with this* 5. Drink plenty of fluids 6. Keep thermostat turn down low 7.For any cough or congestion  Use plain Mucinex- regular strength or max strength is fine   * Children- consult with Pharmacist for dosing 8. For fever or aces or pains- take tylenol or ibuprofen appropriate for age and weight.  * for fevers greater than 101 orally you may alternate ibuprofen  and tylenol every  3 hours.   Meds ordered this encounter  Medications  . amoxicillin-clavulanate (AUGMENTIN) 875-125 MG tablet    Sig: Take 1 tablet by mouth 2 (two) times daily.    Dispense:  14 tablet    Refill:  0    Order Specific Question:   Supervising Provider    Answer:   Worthy Rancher [[id number]]   [place], FNP

## 2018-10-31 NOTE — Patient Instructions (Signed)

## 2018-11-05 DIAGNOSIS — M65321 Trigger finger, right index finger: Secondary | ICD-10-CM | POA: Diagnosis not present

## 2018-11-05 DIAGNOSIS — M20011 Mallet finger of right finger(s): Secondary | ICD-10-CM | POA: Diagnosis not present

## 2018-11-05 DIAGNOSIS — Z5189 Encounter for other specified aftercare: Secondary | ICD-10-CM | POA: Diagnosis not present

## 2018-11-13 DIAGNOSIS — M47816 Spondylosis without myelopathy or radiculopathy, lumbar region: Secondary | ICD-10-CM | POA: Diagnosis not present

## 2018-11-13 DIAGNOSIS — G894 Chronic pain syndrome: Secondary | ICD-10-CM | POA: Diagnosis not present

## 2018-11-13 DIAGNOSIS — Z79899 Other long term (current) drug therapy: Secondary | ICD-10-CM | POA: Diagnosis not present

## 2018-11-13 DIAGNOSIS — Z5181 Encounter for therapeutic drug level monitoring: Secondary | ICD-10-CM | POA: Diagnosis not present

## 2018-11-13 DIAGNOSIS — M961 Postlaminectomy syndrome, not elsewhere classified: Secondary | ICD-10-CM | POA: Diagnosis not present

## 2018-11-19 DIAGNOSIS — M7541 Impingement syndrome of right shoulder: Secondary | ICD-10-CM | POA: Diagnosis not present

## 2018-11-19 DIAGNOSIS — M25511 Pain in right shoulder: Secondary | ICD-10-CM | POA: Diagnosis not present

## 2018-11-26 ENCOUNTER — Other Ambulatory Visit: Payer: Self-pay | Admitting: *Deleted

## 2018-11-26 DIAGNOSIS — K219 Gastro-esophageal reflux disease without esophagitis: Secondary | ICD-10-CM

## 2018-11-26 MED ORDER — ESOMEPRAZOLE MAGNESIUM 40 MG PO CPDR: DELAYED_RELEASE_CAPSULE | ORAL | 1 refills | Status: DC

## 2018-12-02 DIAGNOSIS — Z472 Encounter for removal of internal fixation device: Secondary | ICD-10-CM | POA: Diagnosis not present

## 2018-12-02 DIAGNOSIS — M20011 Mallet finger of right finger(s): Secondary | ICD-10-CM | POA: Diagnosis not present

## 2018-12-10 DIAGNOSIS — M20011 Mallet finger of right finger(s): Secondary | ICD-10-CM | POA: Diagnosis not present

## 2018-12-10 DIAGNOSIS — M65321 Trigger finger, right index finger: Secondary | ICD-10-CM | POA: Diagnosis not present

## 2018-12-10 DIAGNOSIS — M79641 Pain in right hand: Secondary | ICD-10-CM | POA: Diagnosis not present

## 2018-12-10 DIAGNOSIS — Z5189 Encounter for other specified aftercare: Secondary | ICD-10-CM | POA: Diagnosis not present

## 2018-12-18 ENCOUNTER — Other Ambulatory Visit: Payer: Self-pay | Admitting: Physician Assistant

## 2018-12-18 MED ORDER — FLUTICASONE PROPIONATE 50 MCG/ACT NA SUSP: 2.0000 | Freq: Every day | NASAL | 6 refills | Status: DC

## 2018-12-21 ENCOUNTER — Other Ambulatory Visit: Payer: Self-pay | Admitting: Family Medicine

## 2018-12-21 DIAGNOSIS — E785 Hyperlipidemia, unspecified: Secondary | ICD-10-CM

## 2019-01-09 DIAGNOSIS — G894 Chronic pain syndrome: Secondary | ICD-10-CM | POA: Diagnosis not present

## 2019-01-09 DIAGNOSIS — M961 Postlaminectomy syndrome, not elsewhere classified: Secondary | ICD-10-CM | POA: Diagnosis not present

## 2019-01-09 DIAGNOSIS — M545 Low back pain: Secondary | ICD-10-CM | POA: Diagnosis not present

## 2019-01-09 DIAGNOSIS — M47816 Spondylosis without myelopathy or radiculopathy, lumbar region: Secondary | ICD-10-CM | POA: Diagnosis not present

## 2019-02-05 IMAGING — DX DG CHEST 2V
2 series · 2 of 2 positions shown · non-contrast
Comparison: 11/26/2015

CLINICAL DATA: Cough

EXAM:
CHEST  2 VIEW

[chest pa]
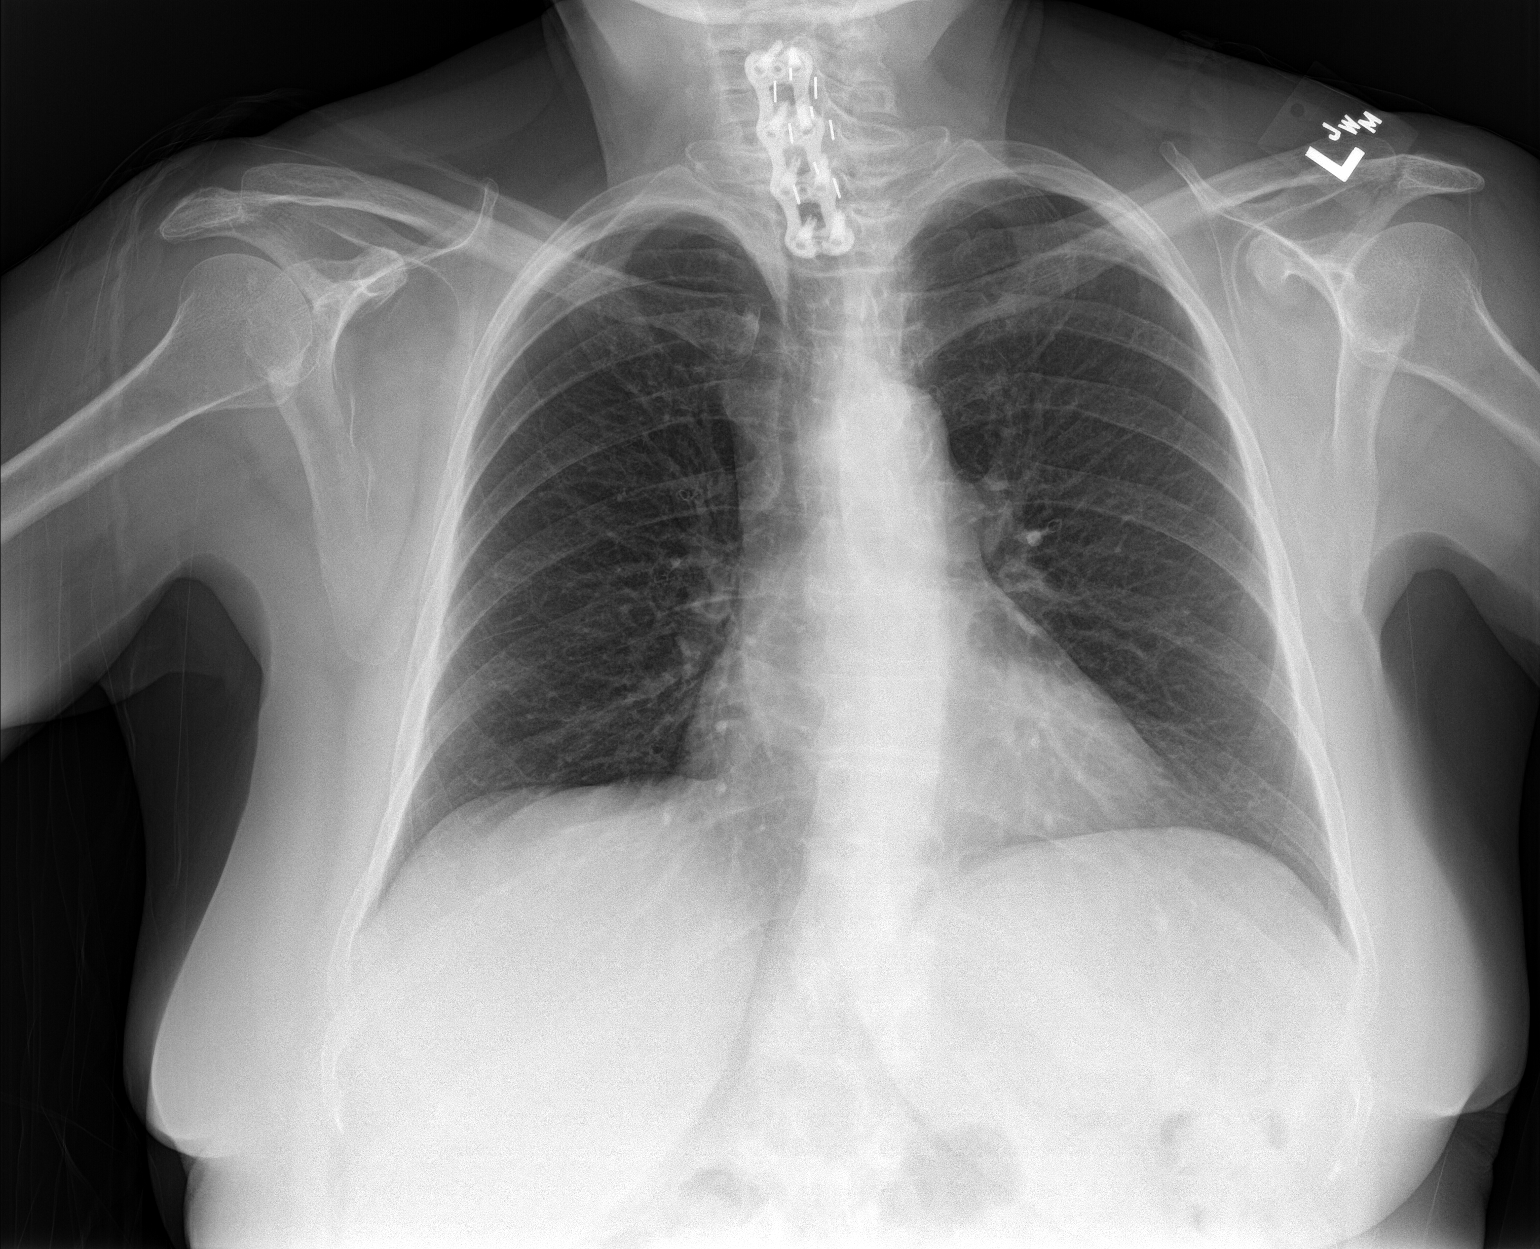

[chest lat]
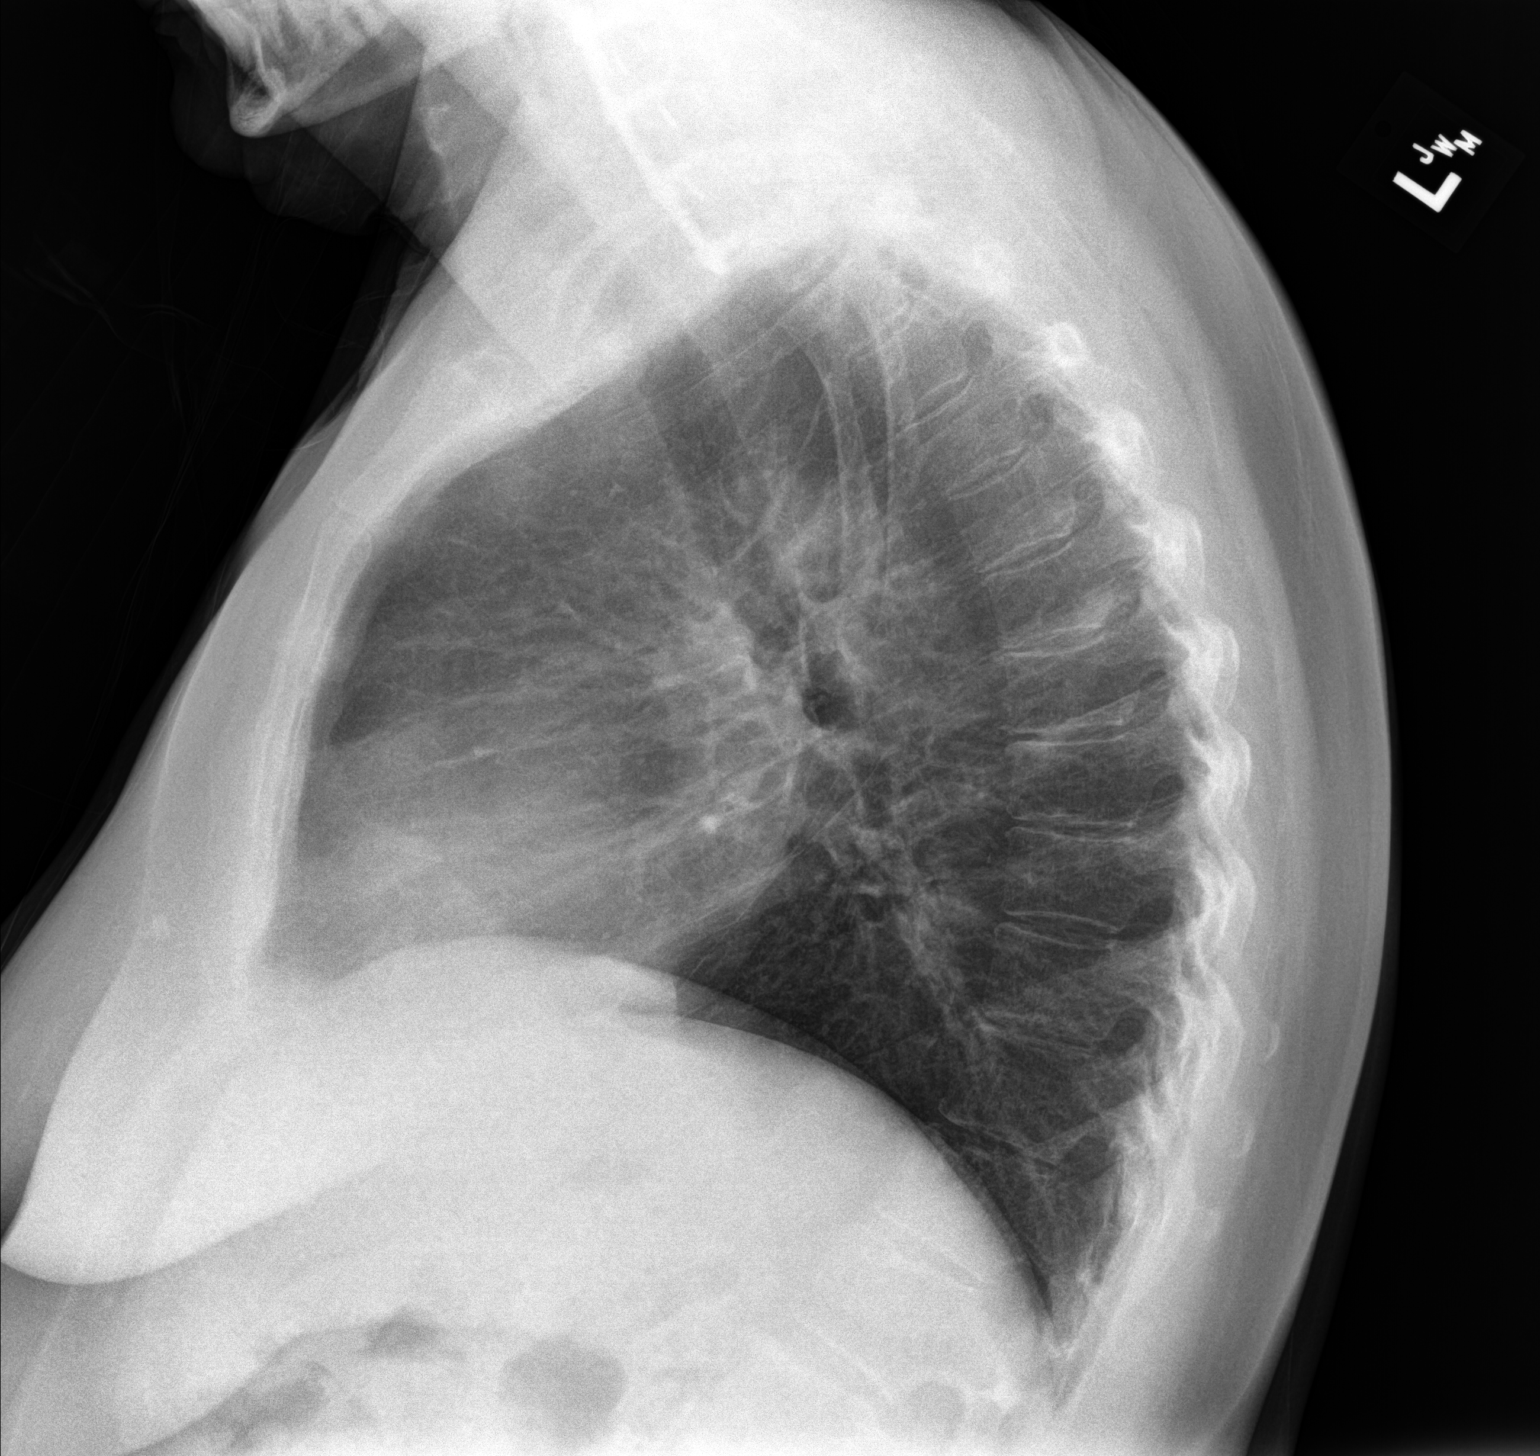

[2 of 2 positions shown; findings below may reference images not displayed]

FINDINGS: Heart and mediastinal contours are within normal limits. No focal
opacities or effusions. No acute bony abnormality.
IMPRESSION: No active cardiopulmonary disease.

## 2019-02-06 DIAGNOSIS — M79645 Pain in left finger(s): Secondary | ICD-10-CM | POA: Diagnosis not present

## 2019-02-06 DIAGNOSIS — M65321 Trigger finger, right index finger: Secondary | ICD-10-CM | POA: Diagnosis not present

## 2019-02-06 DIAGNOSIS — M20011 Mallet finger of right finger(s): Secondary | ICD-10-CM | POA: Diagnosis not present

## 2019-02-12 DIAGNOSIS — M47816 Spondylosis without myelopathy or radiculopathy, lumbar region: Secondary | ICD-10-CM | POA: Diagnosis not present

## 2019-02-12 DIAGNOSIS — G894 Chronic pain syndrome: Secondary | ICD-10-CM | POA: Diagnosis not present

## 2019-02-12 DIAGNOSIS — M961 Postlaminectomy syndrome, not elsewhere classified: Secondary | ICD-10-CM | POA: Diagnosis not present

## 2019-02-13 ENCOUNTER — Other Ambulatory Visit: Payer: Self-pay

## 2019-02-14 ENCOUNTER — Other Ambulatory Visit: Payer: Self-pay

## 2019-02-14 ENCOUNTER — Ambulatory Visit (INDEPENDENT_AMBULATORY_CARE_PROVIDER_SITE_OTHER): Payer: Managed Care, Other (non HMO) | Admitting: Family Medicine

## 2019-02-14 ENCOUNTER — Encounter: Payer: Self-pay | Admitting: Family Medicine

## 2019-02-14 VITALS — BP 115/70 | HR 76 | Temp 97.4°F | Ht 59.0 in | Wt 123.0 lb

## 2019-02-14 DIAGNOSIS — E785 Hyperlipidemia, unspecified: Secondary | ICD-10-CM

## 2019-02-14 DIAGNOSIS — E1159 Type 2 diabetes mellitus with other circulatory complications: Secondary | ICD-10-CM

## 2019-02-14 DIAGNOSIS — Z114 Encounter for screening for human immunodeficiency virus [HIV]: Secondary | ICD-10-CM | POA: Diagnosis not present

## 2019-02-14 DIAGNOSIS — F17200 Nicotine dependence, unspecified, uncomplicated: Secondary | ICD-10-CM | POA: Diagnosis not present

## 2019-02-14 DIAGNOSIS — I10 Essential (primary) hypertension: Secondary | ICD-10-CM

## 2019-02-14 DIAGNOSIS — K219 Gastro-esophageal reflux disease without esophagitis: Secondary | ICD-10-CM

## 2019-02-14 DIAGNOSIS — D649 Anemia, unspecified: Secondary | ICD-10-CM

## 2019-02-14 DIAGNOSIS — E1169 Type 2 diabetes mellitus with other specified complication: Secondary | ICD-10-CM

## 2019-02-14 DIAGNOSIS — I152 Hypertension secondary to endocrine disorders: Secondary | ICD-10-CM

## 2019-02-14 DIAGNOSIS — E1165 Type 2 diabetes mellitus with hyperglycemia: Secondary | ICD-10-CM | POA: Diagnosis not present

## 2019-02-14 DIAGNOSIS — Z124 Encounter for screening for malignant neoplasm of cervix: Secondary | ICD-10-CM

## 2019-02-14 DIAGNOSIS — Z0000 Encounter for general adult medical examination without abnormal findings: Secondary | ICD-10-CM

## 2019-02-14 LAB — BAYER DCA HB A1C WAIVED: HB A1C (BAYER DCA - WAIVED): 6.4 % (ref <7.0)

## 2019-02-14 MED ORDER — ESOMEPRAZOLE MAGNESIUM 40 MG PO CPDR: DELAYED_RELEASE_CAPSULE | ORAL | 1 refills | Status: DC

## 2019-02-14 MED ORDER — LISINOPRIL 5 MG PO TABS: 5.0000 mg | ORAL_TABLET | Freq: Every day | ORAL | 3 refills | Status: DC

## 2019-02-14 MED ORDER — ATORVASTATIN CALCIUM 10 MG PO TABS: 10.0000 mg | ORAL_TABLET | Freq: Every day | ORAL | 3 refills | Status: DC

## 2019-02-14 NOTE — Patient Instructions (Signed)
You had labs performed today.  You will be contacted with the results of the labs once they are available, usually in the next 3 business days for routine lab work.  If you had a pap smear or biopsy performed, expect to be contacted in about 7-10 days.  Health Maintenance, Female Adopting a healthy lifestyle and getting preventive care can go a long way to promote health and wellness. Talk with your health care provider about what schedule of regular examinations is right for you. This is a good chance for you to check in with your provider about disease prevention and staying healthy. In between checkups, there are plenty of things you can do on your own. Experts have done a lot of research about which lifestyle changes and preventive measures are most likely to keep you healthy. Ask your health care provider for more information. Weight and diet Eat a healthy diet  Be sure to include plenty of vegetables, fruits, low-fat dairy products, and lean protein.  Do not eat a lot of foods high in solid fats, added sugars, or salt.  Get regular exercise. This is one of the most important things you can do for your health. ? Most adults should exercise for at least 150 minutes each week. The exercise should increase your heart rate and make you sweat (moderate-intensity exercise). ? Most adults should also do strengthening exercises at least twice a week. This is in addition to the moderate-intensity exercise. Maintain a healthy weight  Body mass index (BMI) is a measurement that can be used to identify possible weight problems. It estimates body fat based on height and weight. Your health care provider can help determine your BMI and help you achieve or maintain a healthy weight.  For females 20 years of age and older: ? A BMI below 18.5 is considered underweight. ? A BMI of 18.5 to 24.9 is normal. ? A BMI of 25 to 29.9 is considered overweight. ? A BMI of 30 and above is considered obese. Watch  levels of cholesterol and blood lipids  You should start having your blood tested for lipids and cholesterol at 62 years of age, then have this test every 5 years.  You may need to have your cholesterol levels checked more often if: ? Your lipid or cholesterol levels are high. ? You are older than 62 years of age. ? You are at high risk for heart disease. Cancer screening Lung Cancer  Lung cancer screening is recommended for adults 55-80 years old who are at high risk for lung cancer because of a history of smoking.  A yearly low-dose CT scan of the lungs is recommended for people who: ? Currently smoke. ? Have quit within the past 15 years. ? Have at least a 30-pack-year history of smoking. A pack year is smoking an average of one pack of cigarettes a day for 1 year.  Yearly screening should continue until it has been 15 years since you quit.  Yearly screening should stop if you develop a health problem that would prevent you from having lung cancer treatment. Breast Cancer  Practice breast self-awareness. This means understanding how your breasts normally appear and feel.  It also means doing regular breast self-exams. Let your health care provider know about any changes, no matter how small.  If you are in your 20s or 30s, you should have a clinical breast exam (CBE) by a health care provider every 1-3 years as part of a regular health exam.    If you are 40 or older, have a CBE every year. Also consider having a breast X-ray (mammogram) every year.  If you have a family history of breast cancer, talk to your health care provider about genetic screening.  If you are at high risk for breast cancer, talk to your health care provider about having an MRI and a mammogram every year.  Breast cancer gene (BRCA) assessment is recommended for women who have family members with BRCA-related cancers. BRCA-related cancers include: ? Breast. ? Ovarian. ? Tubal. ? Peritoneal  cancers.  Results of the assessment will determine the need for genetic counseling and BRCA1 and BRCA2 testing. Cervical Cancer Your health care provider may recommend that you be screened regularly for cancer of the pelvic organs (ovaries, uterus, and vagina). This screening involves a pelvic examination, including checking for microscopic changes to the surface of your cervix (Pap test). You may be encouraged to have this screening done every 3 years, beginning at age 21.  For women ages 30-65, health care providers may recommend pelvic exams and Pap testing every 3 years, or they may recommend the Pap and pelvic exam, combined with testing for human papilloma virus (HPV), every 5 years. Some types of HPV increase your risk of cervical cancer. Testing for HPV may also be done on women of any age with unclear Pap test results.  Other health care providers may not recommend any screening for nonpregnant women who are considered low risk for pelvic cancer and who do not have symptoms. Ask your health care provider if a screening pelvic exam is right for you.  If you have had past treatment for cervical cancer or a condition that could lead to cancer, you need Pap tests and screening for cancer for at least 20 years after your treatment. If Pap tests have been discontinued, your risk factors (such as having a new sexual partner) need to be reassessed to determine if screening should resume. Some women have medical problems that increase the chance of getting cervical cancer. In these cases, your health care provider may recommend more frequent screening and Pap tests. Colorectal Cancer  This type of cancer can be detected and often prevented.  Routine colorectal cancer screening usually begins at 62 years of age and continues through 62 years of age.  Your health care provider may recommend screening at an earlier age if you have risk factors for colon cancer.  Your health care provider may also  recommend using home test kits to check for hidden blood in the stool.  A small camera at the end of a tube can be used to examine your colon directly (sigmoidoscopy or colonoscopy). This is done to check for the earliest forms of colorectal cancer.  Routine screening usually begins at age 50.  Direct examination of the colon should be repeated every 5-10 years through 62 years of age. However, you may need to be screened more often if early forms of precancerous polyps or small growths are found. Skin Cancer  Check your skin from head to toe regularly.  Tell your health care provider about any new moles or changes in moles, especially if there is a change in a mole's shape or color.  Also tell your health care provider if you have a mole that is larger than the size of a pencil eraser.  Always use sunscreen. Apply sunscreen liberally and repeatedly throughout the day.  Protect yourself by wearing long sleeves, pants, a wide-brimmed hat, and sunglasses whenever you   are outside. Heart disease, diabetes, and high blood pressure  High blood pressure causes heart disease and increases the risk of stroke. High blood pressure is more likely to develop in: ? People who have blood pressure in the high end of the normal range (130-139/85-89 mm Hg). ? People who are overweight or obese. ? People who are African American.  If you are 18-39 years of age, have your blood pressure checked every 3-5 years. If you are 40 years of age or older, have your blood pressure checked every year. You should have your blood pressure measured twice-once when you are at a hospital or clinic, and once when you are not at a hospital or clinic. Record the average of the two measurements. To check your blood pressure when you are not at a hospital or clinic, you can use: ? An automated blood pressure machine at a pharmacy. ? A home blood pressure monitor.  If you are between 55 years and 79 years old, ask your health  care provider if you should take aspirin to prevent strokes.  Have regular diabetes screenings. This involves taking a blood sample to check your fasting blood sugar level. ? If you are at a normal weight and have a low risk for diabetes, have this test once every three years after 62 years of age. ? If you are overweight and have a high risk for diabetes, consider being tested at a younger age or more often. Preventing infection Hepatitis B  If you have a higher risk for hepatitis B, you should be screened for this virus. You are considered at high risk for hepatitis B if: ? You were born in a country where hepatitis B is common. Ask your health care provider which countries are considered high risk. ? Your parents were born in a high-risk country, and you have not been immunized against hepatitis B (hepatitis B vaccine). ? You have HIV or AIDS. ? You use needles to inject street drugs. ? You live with someone who has hepatitis B. ? You have had sex with someone who has hepatitis B. ? You get hemodialysis treatment. ? You take certain medicines for conditions, including cancer, organ transplantation, and autoimmune conditions. Hepatitis C  Blood testing is recommended for: ? Everyone born from 1945 through 1965. ? Anyone with known risk factors for hepatitis C. Sexually transmitted infections (STIs)  You should be screened for sexually transmitted infections (STIs) including gonorrhea and chlamydia if: ? You are sexually active and are younger than 62 years of age. ? You are older than 62 years of age and your health care provider tells you that you are at risk for this type of infection. ? Your sexual activity has changed since you were last screened and you are at an increased risk for chlamydia or gonorrhea. Ask your health care provider if you are at risk.  If you do not have HIV, but are at risk, it may be recommended that you take a prescription medicine daily to prevent HIV  infection. This is called pre-exposure prophylaxis (PrEP). You are considered at risk if: ? You are sexually active and do not regularly use condoms or know the HIV status of your partner(s). ? You take drugs by injection. ? You are sexually active with a partner who has HIV. Talk with your health care provider about whether you are at high risk of being infected with HIV. If you choose to begin PrEP, you should first be tested for HIV. You   should then be tested every 3 months for as long as you are taking PrEP. Pregnancy  If you are premenopausal and you may become pregnant, ask your health care provider about preconception counseling.  If you may become pregnant, take 400 to 800 micrograms (mcg) of folic acid every day.  If you want to prevent pregnancy, talk to your health care provider about birth control (contraception). Osteoporosis and menopause  Osteoporosis is a disease in which the bones lose minerals and strength with aging. This can result in serious bone fractures. Your risk for osteoporosis can be identified using a bone density scan.  If you are 65 years of age or older, or if you are at risk for osteoporosis and fractures, ask your health care provider if you should be screened.  Ask your health care provider whether you should take a calcium or vitamin D supplement to lower your risk for osteoporosis.  Menopause may have certain physical symptoms and risks.  Hormone replacement therapy may reduce some of these symptoms and risks. Talk to your health care provider about whether hormone replacement therapy is right for you. Follow these instructions at home:  Schedule regular health, dental, and eye exams.  Stay current with your immunizations.  Do not use any tobacco products including cigarettes, chewing tobacco, or electronic cigarettes.  If you are pregnant, do not drink alcohol.  If you are breastfeeding, limit how much and how often you drink alcohol.  Limit  alcohol intake to no more than 1 drink per day for nonpregnant women. One drink equals 12 ounces of beer, 5 ounces of wine, or 1 ounces of hard liquor.  Do not use street drugs.  Do not share needles.  Ask your health care provider for help if you need support or information about quitting drugs.  Tell your health care provider if you often feel depressed.  Tell your health care provider if you have ever been abused or do not feel safe at home. This information is not intended to replace advice given to you by your health care provider. Make sure you discuss any questions you have with your health care provider. Document Released: 04/03/2011 Document Revised: 02/24/2016 Document Reviewed: 06/22/2015 Elsevier Interactive Patient Education  2019 [employer] Inc[employer]  

## 2019-02-14 NOTE — Progress Notes (Signed)
Lori Schroeder is a 63 y.o. female presents to office today for annual physical exam examination.    Concerns today include: 1. Type 2 Diabetes with hypertension and hyperlipidemia:  Patient reports High at home: 150; Low at home: 102, Taking medication(s): trulicity 1.5 q week, lisinopril 5 mg daily and Lipitor 10 mg daily, Side effects: none.  She admits to eating poorly since the COVID-19 outbreak has resulted in at home orders.  She is baking more frequently cooking more often, particularly since her grandchildren at home and out of school.  She notes that she has gained some weight as a result and is worried that her blood sugar is going to be high despite it reading fairly normal at home.  Last eye exam: needs Last foot exam: needs Last A1c:  Lab Results  Component Value Date   HGBA1C 6.3 09/09/2018   Nephropathy screen indicated?: UTD Last flu, zoster and/or pneumovax:  Immunization History  Administered Date(s) Administered  . Pneumococcal Conjugate-13 05/29/2018  . Tdap 05/29/2018    ROS: Denies dizziness, foot ulcerations, numbness or tingling in the lower extremities outside of normal.  Occupation: Disabled, Marital status: Married, Substance use: None Diet: Not good lately, Exercise: Tries to stay active but chronic pain limits her in her left lower extremity Last eye exam: Needs but avoiding the office is open again Last colonoscopy: needs Last mammogram: Gets w/OBGYN Last pap smear: as above  Past Medical History:  Diagnosis Date  . Blood transfusion without reported diagnosis   . External hemorrhoids without mention of complication [id number]   Colonoscopy  . Family history of malignant neoplasm of gastrointestinal tract   . Family history of malignant neoplasm of gastrointestinal tract   . GERD (gastroesophageal reflux disease)   . Hyperlipemia   . Hypertension    Social History   Socioeconomic History  . Marital status: Married    Spouse name: Not on file   . Number of children: 2  . Years of education: Not on file  . Highest education level: Not on file  Occupational History  . Occupation: disability    Employer: DISABLED  Social Needs  . Financial resource strain: Not on file  . Food insecurity:    Worry: Not on file    Inability: Not on file  . Transportation needs:    Medical: Not on file    Non-medical: Not on file  Tobacco Use  . Smoking status: Current Every Day Smoker    Packs/day: 0.30    Years: 8.00    Pack years: 2.40    Types: Cigarettes  . Smokeless tobacco: Never Used  Substance and Sexual Activity  . Alcohol use: No  . Drug use: No  . Sexual activity: Not on file  Lifestyle  . Physical activity:    Days per week: Not on file    Minutes per session: Not on file  . Stress: Not on file  Relationships  . Social connections:    Talks on phone: Not on file    Gets together: Not on file    Attends religious service: Not on file    Active member of club or organization: Not on file    Attends meetings of clubs or organizations: Not on file    Relationship status: Not on file  . Intimate partner violence:    Fear of current or ex partner: Not on file    Emotionally abused: Not on file    Physically abused: Not on file  Forced sexual activity: Not on file  Other Topics Concern  . Not on file  Social History Narrative  . Not on file   Past Surgical History:  Procedure Laterality Date  . ABDOMINAL HYSTERECTOMY    . BACK SURGERY    . BLADDER SURGERY     Bladder Tact  . CARPAL TUNNEL RELEASE Right   . COLONOSCOPY    . NECK SURGERY     Family History  Problem Relation Age of Onset  . Colon cancer Maternal Grandmother   . Breast cancer Neg Hx   . Celiac disease Neg Hx   . Cirrhosis Neg Hx   . Clotting disorder Neg Hx   . Colitis Neg Hx   . Colon polyps Neg Hx   . Crohn's disease Neg Hx   . Cystic fibrosis Neg Hx   . Diabetes Neg Hx   . Esophageal cancer Neg Hx   . Heart disease Neg Hx   .  Hemochromatosis Neg Hx   . Inflammatory bowel disease Neg Hx   . Irritable bowel syndrome Neg Hx   . Kidney disease Neg Hx   . Liver cancer Neg Hx   . Liver disease Neg Hx   . Ovarian cancer Neg Hx   . Pancreatic cancer Neg Hx   . Prostate cancer Neg Hx   . Rectal cancer Neg Hx   . Stomach cancer Neg Hx   . Ulcerative colitis Neg Hx   . Uterine cancer Neg Hx   . Wilson's disease Neg Hx     Current Outpatient Medications:  .  atorvastatin (LIPITOR) 10 MG tablet, Take 1 tablet (10 mg total) by mouth daily., Disp: 90 tablet, Rfl: 3 .  Dulaglutide (TRULICITY) 1.5 OI/7.1IW SOPN, Inject 1.5 mg into the skin once a week., Disp: 12 pen, Rfl: 4 .  esomeprazole (NEXIUM) 40 MG capsule, TAKE  (1)  CAPSULE  TWICE DAILY., Disp: 180 capsule, Rfl: 1 .  fluticasone (FLONASE) 50 MCG/ACT nasal spray, Place 2 sprays into both nostrils daily., Disp: 16 g, Rfl: 6 .  lisinopril (ZESTRIL) 5 MG tablet, Take 1 tablet (5 mg total) by mouth daily., Disp: 90 tablet, Rfl: 3 .  NUCYNTA 75 MG tablet, Take 75 mg by mouth 3 (three) times daily as needed. , Disp: , Rfl: 0 .  pregabalin (LYRICA) 300 MG capsule, Take 450 mg by mouth daily. , Disp: , Rfl:  .  PROAIR HFA 108 (90 Base) MCG/ACT inhaler, 2 PUFFS EVERY 6 HOURS AS NEEDED FOR WHEEZING OR SHORTNESS OF BREATH, Disp: 8.5 g, Rfl: 0 .  tiZANidine (ZANAFLEX) 4 MG tablet, TAKE ONE TABLET BY MOUTH THREE TIMES DAILY AS NEEDED FOR MUSCLE SPASM, Disp: , Rfl:  .  venlafaxine XR (EFFEXOR-XR) 75 MG 24 hr capsule, venlafaxine ER 75 mg capsule,extended release 24 hr, Disp: , Rfl:  .  naloxone (NARCAN) nasal spray 4 mg/0.1 mL, Place 1 spray into the nose once., Disp: , Rfl:   No Known Allergies   ROS: Review of Systems Constitutional: negative Eyes: negative Ears, nose, mouth, throat, and face: negative Respiratory: negative Cardiovascular: negative Gastrointestinal: negative Genitourinary:negative Integument/breast: negative Hematologic/lymphatic: negative  Musculoskeletal:positive for chronic pain RLE. Neurological: positive for neuropathy of RLE Behavioral/Psych: negative Endocrine: negative Allergic/Immunologic: negative    Physical exam BP 115/70   Pulse 76   Temp (!) 97.4 F (36.3 C) (Oral)   Ht '4\' 11"'$  (1.499 m)   Wt 123 lb (55.8 kg)   BMI 24.84 kg/m  General appearance: alert,  cooperative, appears stated age and no distress Head: Normocephalic, without obvious abnormality, atraumatic Eyes: negative findings: lids and lashes normal, conjunctivae and sclerae normal, corneas clear and pupils equal, round, reactive to light and accomodation Ears: normal TM's and external ear canals both ears Nose: Nares normal. Septum midline. Mucosa normal. No drainage or sinus tenderness. Throat: lips, mucosa, and tongue normal; teeth and gums normal Neck: no adenopathy, no carotid bruit, no JVD, supple, symmetrical, trachea midline and thyroid not enlarged, symmetric, no tenderness/mass/nodules Back: symmetric, no curvature. ROM normal. No CVA tenderness. Lungs: clear to auscultation bilaterally Heart: regular rate and rhythm, S1, S2 normal, no murmur, click, rub or gallop Abdomen: soft, non-tender; bowel sounds normal; no masses,  no organomegaly Extremities: extremities normal, atraumatic, no cyanosis or edema Pulses: 2+ and symmetric Skin: Skin color, texture, turgor normal. No rashes or lesions Lymph nodes: Cervical, supraclavicular, and axillary nodes normal. Neurologic: Alert and oriented x3.  Follows all commands.  Patellar DTRs 2 out of 4 on the left and 1 out of 4 on the right. Psych: Mood stable, speech normal, affect appropriate, pleasant and interactive. Depression screen [facility] 2/9 02/14/2019 10/31/2018 09/09/2018  Decreased Interest 0 0 0  Down, Depressed, Hopeless 0 0 0  PHQ - 2 Score 0 0 0  Altered sleeping 0 - 0  Tired, decreased energy 0 - 0  Change in appetite 0 - 0  Feeling bad or failure about yourself  0 - 0  Trouble  concentrating 0 - 0  Moving slowly or fidgety/restless 0 - 0  Suicidal thoughts 0 - 0  PHQ-9 Score 0 - 0  Difficult doing work/chores - - Not difficult at all    Assessment/ Plan: Leward Quan here for annual physical exam.   1. Type 2 diabetes mellitus with hyperglycemia, without long-term current use of insulin (HCC) Controlled with A1c of 6.4 today.  Continue Trulicity - Bayer DCA Hb A1c Waived  2. Annual physical exam We will need to obtain records of mammogram, Pap smear and colonoscopy  3. Hyperlipidemia associated with type 2 diabetes mellitus (HCC) Check fasting lipid panel - Lipid Panel - TSH  4. Hypertension associated with diabetes ([place]) Controlled - CMP14+EGFR  5. Tobacco use disorder Counseling performed  6. Anemia, unspecified type - CBC  7. Screening for HIV without presence of risk factors - HIV antibody (with reflex)  8. Screening for malignant neoplasm of cervix Gets done with [facility].  ROI obtained and will obtain records.   Counseled on healthy lifestyle choices, including diet (rich in fruits, vegetables and lean meats and low in salt and simple carbohydrates) and exercise (at least 30 minutes of moderate physical activity daily).  Patient to follow up in 1 year for annual exam or sooner if needed.  Jireh Elmore M. Lajuana Ripple, DO

## 2019-02-15 LAB — CMP14+EGFR
ALT: 19 IU/L (ref 0–32)
AST: 17 IU/L (ref 0–40)
Albumin/Globulin Ratio: 2 (ref 1.2–2.2)
Albumin: 4.4 g/dL (ref 3.8–4.8)
Alkaline Phosphatase: 106 IU/L (ref 39–117)
BUN/Creatinine Ratio: 16 (ref 12–28)
BUN: 10 mg/dL (ref 8–27)
Bilirubin Total: 0.3 mg/dL (ref 0.0–1.2)
CO2: 23 mmol/L (ref 20–29)
Calcium: 9.8 mg/dL (ref 8.7–10.3)
Chloride: 104 mmol/L (ref 96–106)
Creatinine, Ser: 0.63 mg/dL (ref 0.57–1.00)
GFR calc Af Amer: 112 mL/min/{1.73_m2} (ref >59)
GFR calc non Af Amer: 97 mL/min/{1.73_m2} (ref >59)
Globulin, Total: 2.2 g/dL (ref 1.5–4.5)
Glucose: 91 mg/dL (ref 65–99)
Potassium: 4.2 mmol/L (ref 3.5–5.2)
Sodium: 140 mmol/L (ref 134–144)
Total Protein: 6.6 g/dL (ref 6.0–8.5)

## 2019-02-15 LAB — LIPID PANEL
Chol/HDL Ratio: 2.8 ratio (ref 0.0–4.4)
Cholesterol, Total: 148 mg/dL (ref 100–199)
HDL: 52 mg/dL (ref >39)
LDL Calculated: 75 mg/dL (ref 0–99)
Triglycerides: 103 mg/dL (ref 0–149)
VLDL Cholesterol Cal: 21 mg/dL (ref 5–40)

## 2019-02-15 LAB — CBC
Hematocrit: 41.3 % (ref 34.0–46.6)
Hemoglobin: 14.4 g/dL (ref 11.1–15.9)
MCH: 30.8 pg (ref 26.6–33.0)
MCHC: 34.9 g/dL (ref 31.5–35.7)
MCV: 88 fL (ref 79–97)
Platelets: 402 10*3/uL (ref 150–450)
RBC: 4.67 x10E6/uL (ref 3.77–5.28)
RDW: 12.3 % (ref 11.7–15.4)
WBC: 10.2 10*3/uL (ref 3.4–10.8)

## 2019-02-15 LAB — HIV ANTIBODY (ROUTINE TESTING W REFLEX): HIV Screen 4th Generation wRfx: NONREACTIVE

## 2019-02-15 LAB — TSH: TSH: 1.97 u[IU]/mL (ref 0.450–4.500)

## 2019-03-06 DIAGNOSIS — M20011 Mallet finger of right finger(s): Secondary | ICD-10-CM | POA: Diagnosis not present

## 2019-03-06 DIAGNOSIS — M65321 Trigger finger, right index finger: Secondary | ICD-10-CM | POA: Diagnosis not present

## 2019-03-06 DIAGNOSIS — M79645 Pain in left finger(s): Secondary | ICD-10-CM | POA: Diagnosis not present

## 2019-03-06 DIAGNOSIS — M13842 Other specified arthritis, left hand: Secondary | ICD-10-CM | POA: Diagnosis not present

## 2019-03-19 ENCOUNTER — Encounter: Payer: Self-pay | Admitting: *Deleted

## 2019-03-19 ENCOUNTER — Other Ambulatory Visit: Payer: Self-pay

## 2019-03-19 ENCOUNTER — Ambulatory Visit (INDEPENDENT_AMBULATORY_CARE_PROVIDER_SITE_OTHER): Payer: Managed Care, Other (non HMO) | Admitting: *Deleted

## 2019-03-19 DIAGNOSIS — Z0000 Encounter for general adult medical examination without abnormal findings: Secondary | ICD-10-CM

## 2019-03-19 NOTE — Progress Notes (Signed)
MEDICARE ANNUAL WELLNESS VISIT  03/19/2019  Telephone Visit Disclaimer This Medicare AWV was conducted by telephone due to national recommendations for restrictions regarding the COVID-19 Pandemic (e.g. social distancing).  I verified, using two identifiers, that I am speaking with Lori Schroeder or their authorized healthcare agent. I discussed the limitations, risks, security, and privacy concerns of performing an evaluation and management service by telephone and the potential availability of an in-person appointment in the future. The patient expressed understanding and agreed to proceed.   Subjective:  Lori Schroeder is a 63 y.o. female patient of [name], DO who had a Medicare Annual Wellness Visit today via telephone. Lori Schroeder is disabled and lives with her husband, adult daughter, and granddaughter. she has 2 adult children and 5 grandchildren. She reports that she is socially active and does interact with friends/family regularly. She is minimally physically active due to lower back and left leg pain.  She enjoys reading and working in her yard.  Patient Care Team: [name], DO as PCP - General (Family Medicine)  Marshall, Otho   Advanced Directives 03/19/2019 01/18/2016  Does Patient Have a Medical Advance Directive? No No  Would patient like information on creating a medical advance directive? Yes (MAU/Ambulatory/Procedural Areas - Information given) -    Hospital Utilization Over the Past 12 Months: # of hospitalizations or ER visits: 0 # of surgeries: 0  Review of Systems    Patient reports that her overall health is unchanged compared to last year.    Review of Systems:   Musculoskeletal - lower back and left leg pain  Other systems negative reported by patient  Pain Assessment Pain Score: 4      Current Medications & Allergies (verified) Allergies as of 03/19/2019   No Known Allergies     Medication List       Accurate as of  March 19, 2019  4:07 PM. If you have any questions, ask your nurse or doctor.        atorvastatin 10 MG tablet Commonly known as: LIPITOR Take 1 tablet (10 mg total) by mouth daily.   Dulaglutide 1.5 MG/0.5ML Sopn Commonly known as: Trulicity Inject 1.5 mg into the skin once a week.   esomeprazole 40 MG capsule Commonly known as: NEXIUM TAKE  (1)  CAPSULE  TWICE DAILY.   fluticasone 50 MCG/ACT nasal spray Commonly known as: FLONASE Place 2 sprays into both nostrils daily.   lisinopril 5 MG tablet Commonly known as: ZESTRIL Take 1 tablet (5 mg total) by mouth daily.   naloxone 4 MG/0.1ML Liqd nasal spray kit Commonly known as: NARCAN Place 1 spray into the nose once.   Nucynta 75 MG tablet Generic drug: tapentadol HCl Take 75 mg by mouth 3 (three) times daily as needed.   pregabalin 300 MG capsule Commonly known as: LYRICA Take 300 mg by mouth daily.   ProAir HFA 108 (90 Base) MCG/ACT inhaler Generic drug: albuterol 2 PUFFS EVERY 6 HOURS AS NEEDED FOR WHEEZING OR SHORTNESS OF BREATH   tiZANidine 4 MG tablet Commonly known as: ZANAFLEX TAKE ONE TABLET BY MOUTH THREE TIMES DAILY AS NEEDED FOR MUSCLE SPASM   venlafaxine XR 75 MG 24 hr capsule Commonly known as: EFFEXOR-XR venlafaxine ER 75 mg capsule,extended release 24 hr       History (reviewed): Past Medical History:  Diagnosis Date  . Blood transfusion without reported diagnosis   . External hemorrhoids without mention of complication 10-04-1592  Colonoscopy  . Family history of malignant neoplasm of gastrointestinal tract   . Family history of malignant neoplasm of gastrointestinal tract   . GERD (gastroesophageal reflux disease)   . Hyperlipemia   . Hypertension    Past Surgical History:  Procedure Laterality Date  . ABDOMINAL HYSTERECTOMY    . BACK SURGERY    . BLADDER SURGERY     Bladder Tact  . CARPAL TUNNEL RELEASE Right   . COLONOSCOPY    . NECK SURGERY     Family History  Problem  Relation Age of Onset  . Colon cancer Maternal Grandmother   . Alzheimer's disease Mother   . Hyperlipidemia Mother   . Hypertension Mother   . Diabetes Daughter        Type 1  . Breast cancer Neg Hx   . Celiac disease Neg Hx   . Cirrhosis Neg Hx   . Clotting disorder Neg Hx   . Colitis Neg Hx   . Colon polyps Neg Hx   . Crohn's disease Neg Hx   . Cystic fibrosis Neg Hx   . Esophageal cancer Neg Hx   . Heart disease Neg Hx   . Hemochromatosis Neg Hx   . Inflammatory bowel disease Neg Hx   . Irritable bowel syndrome Neg Hx   . Kidney disease Neg Hx   . Liver cancer Neg Hx   . Liver disease Neg Hx   . Ovarian cancer Neg Hx   . Pancreatic cancer Neg Hx   . Prostate cancer Neg Hx   . Rectal cancer Neg Hx   . Stomach cancer Neg Hx   . Ulcerative colitis Neg Hx   . Uterine cancer Neg Hx   . Wilson's disease Neg Hx    Social History   Socioeconomic History  . Marital status: Married    Spouse name: Not on file  . Number of children: 2  . Years of education: 31  . Highest education level: Associate degree: occupational, Hotel manager, or vocational program  Occupational History  . Occupation: disability    Employer: DISABLED  Social Needs  . Financial resource strain: Not hard at all  . Food insecurity    Worry: Never true    Inability: Never true  . Transportation needs    Medical: No    Non-medical: No  Tobacco Use  . Smoking status: Current Every Day Smoker    Packs/day: 0.50    Years: 10.00    Pack years: 5.00    Types: Cigarettes  . Smokeless tobacco: Never Used  Substance and Sexual Activity  . Alcohol use: No  . Drug use: No  . Sexual activity: Not on file  Lifestyle  . Physical activity    Days per week: 0 days    Minutes per session: 0 min  . Stress: Not at all  Relationships  . Social connections    Talks on phone: More than three times a week    Gets together: More than three times a week    Attends religious service: More than 4 times per year     Active member of club or organization: No    Attends meetings of clubs or organizations: Never    Relationship status: Married  Other Topics Concern  . Not on file  Social History Narrative  . Not on file    Activities of Daily Living In your present state of health, do you have any difficulty performing the following activities: 03/19/2019  Hearing? N  Vision? N  Difficulty concentrating or making decisions? N  Walking or climbing stairs? Y  Comment Due to back and leg pain  Dressing or bathing? N  Doing errands, shopping? N  Preparing Food and eating ? N  Using the Toilet? N  In the past six months, have you accidently leaked urine? Y  Comment Occassional urine leaking  Do you have problems with loss of bowel control? N  Managing your Medications? N  Managing your Finances? N  Housekeeping or managing your Housekeeping? Y  Comment Daughter helps  Some recent data might be hidden        Exercise Current Exercise Habits: The patient does not participate in regular exercise at present, Exercise limited by: orthopedic condition(s)  Diet Patient reports consuming 2 meals a day and 2 snack(s) a day Patient reports that her primary diet is: Diabetic Patient reports that she does have regular access to food.   Depression Screen PHQ 2/9 Scores 03/19/2019 02/14/2019 10/31/2018 09/09/2018 08/27/2018 07/02/2018 06/17/2018  PHQ - 2 Score 0 0 0 0 0 0 0  PHQ- 9 Score - 0 - 0 - - -     Fall Risk Fall Risk  03/19/2019 10/31/2018 09/09/2018 07/02/2018 06/17/2018  Falls in the past year? 1 0 0 Yes Yes  Number falls in past yr: 0 - - 1 1  Injury with Fall? 1 - - Yes Yes  Comment - - - - Finger injury  Follow up Falls prevention discussed;Education provided - - - -     Objective:  Lori Schroeder seemed alert and oriented and she participated appropriately during our telephone visit.  Blood Pressure Weight BMI  BP Readings from Last 3 Encounters:  02/14/19 115/70  10/31/18 (!) 79/54   09/09/18 (!) 117/59   Wt Readings from Last 3 Encounters:  02/14/19 123 lb (55.8 kg)  10/31/18 110 lb (49.9 kg)  09/09/18 114 lb (51.7 kg)   BMI Readings from Last 1 Encounters:  02/14/19 24.84 kg/m    *Unable to obtain current vital signs, weight, and BMI due to telephone visit type  Hearing/Vision  . Chanetta did not seem to have difficulty with hearing/understanding during the telephone conversation . Reports that she has had a formal eye exam by an eye care professional within the past year . Reports that she has not had a formal hearing evaluation within the past year *Unable to fully assess hearing and vision during telephone visit type  Cognitive Function: 6CIT Screen 03/19/2019  What Year? 0 points  What month? 0 points  What time? 0 points  Count back from 20 0 points  Months in reverse 0 points  Repeat phrase 0 points  Total Score 0   (Normal:0-7, Significant for Dysfunction: >8)  Normal Cognitive Function Screening: Yes   Immunization & Health Maintenance Record Immunization History  Administered Date(s) Administered  . Pneumococcal Conjugate-13 05/29/2018  . Tdap 05/29/2018    Health Maintenance  Topic Date Due  . DEXA SCAN  01/25/[birth year]  . PNEUMOCOCCAL POLYSACCHARIDE VACCINE AGE 57-64 HIGH RISK  05/17/1959  . OPHTHALMOLOGY EXAM  05/17/1967  . PAP SMEAR-Modifier  07/06/2016  . COLON CANCER SCREENING ANNUAL FOBT  11/02/2016  . FOOT EXAM  01/29/2019  . HEMOGLOBIN A1C  08/17/2019  . MAMMOGRAM  05/28/2020  . COLONOSCOPY  11/06/2022  . TETANUS/TDAP  05/29/2028  . Hepatitis C Screening  Completed  . HIV Screening  Completed  . INFLUENZA VACCINE  Discontinued       Assessment  This is a routine wellness examination for [employer]  Health Maintenance: Due or Overdue Health Maintenance Due  Topic Date Due  . DEXA SCAN  03/08/1957  . PNEUMOCOCCAL POLYSACCHARIDE VACCINE AGE 63-64 HIGH RISK  05/17/1959  . OPHTHALMOLOGY EXAM  05/17/1967  . PAP  SMEAR-Modifier  07/06/2016  . COLON CANCER SCREENING ANNUAL FOBT  11/02/2016  . FOOT EXAM  01/29/2019   Recommend Dexa scan and foot exam at next in person office visit. Pneumovax 23 due after 05/30/2019   Lori Schroeder does not need a referral for Community Assistance: does not need Care Management:   no Social Work:    no Prescription Assistance:  no Nutrition/Diabetes Education:  no   Plan:  Personalized Goals Goals Addressed            This Visit's Progress   . DIET - EAT MORE NON STARCHY VEGETABLES        Personalized Health Maintenance & Screening Recommendations  Bone densitometry screening Glaucoma screening Smoking cessation counseling Advanced directives: has NO advanced directive  - add't info requested. Referral to SW: no Shingrix vaccine series    Lung Cancer Screening Recommended: no (Low Dose CT Chest recommended if Age 43-80 years, 30 pack-year currently smoking OR have quit w/in past 15 years) Hepatitis C Screening recommended: completed 10/29/2015   Advanced Directives: Written information was not prepared per patient's request.  Referrals & Orders [name]    Follow-up Plan . Follow-up with [name], DO as planned . Schedule eye exam . Schedule GYN exam to discuss pap smear    I have personally reviewed and noted the following in the patient's chart:   . Medical and social history . Use of alcohol, tobacco or illicit drugs  . Current medications and supplements . Functional ability and status . Nutritional status . Physical activity . Advanced directives . List of other physicians . Hospitalizations, surgeries, and ER visits in previous 12 months . Vitals . Screenings to include cognitive, depression, and falls . Referrals and appointments  In addition, I have reviewed and discussed with Lori Schroeder certain preventive protocols, quality metrics, and best practice recommendations. A written personalized care plan for preventive  services as well as general preventive health recommendations is available and can be mailed to the patient at her request.      Nolberto Hanlon, RN  03/19/2019

## 2019-03-19 NOTE — Patient Instructions (Signed)
  Lori Schroeder , Thank you for taking time to talk with me for your Medicare Wellness Visit. I appreciate your ongoing commitment to your health goals. Please review the following plan we discussed and let me know if I can assist you in the future.   These are the goals we discussed: Goals    . DIET - EAT MORE NON STARCHY VEGETABLES       This is a list of the screening recommended for you and due dates:  Health Maintenance  Topic Date Due  . DEXA scan (bone density measurement)  Feb 10, 1956  . Pneumococcal vaccine  05/17/1959  . Eye exam for diabetics  05/17/1967  . Pap Smear  07/06/2016  . Stool Blood Test  11/02/2016  . Complete foot exam   01/29/2019  . Hemoglobin A1C  08/17/2019  . Mammogram  05/28/2020  . Colon Cancer Screening  11/06/2022  . Tetanus Vaccine  05/29/2028  .  Hepatitis C: One time screening is recommended by Center for Disease Control  (CDC) for  adults born from [birth year] through 1964.   Completed  . HIV Screening  Completed  . Flu Shot  Discontinued

## 2019-03-20 ENCOUNTER — Ambulatory Visit: Payer: Medicare Other | Admitting: *Deleted

## 2019-04-08 DIAGNOSIS — M5416 Radiculopathy, lumbar region: Secondary | ICD-10-CM | POA: Diagnosis not present

## 2019-04-08 DIAGNOSIS — M961 Postlaminectomy syndrome, not elsewhere classified: Secondary | ICD-10-CM | POA: Diagnosis not present

## 2019-04-08 DIAGNOSIS — G894 Chronic pain syndrome: Secondary | ICD-10-CM | POA: Diagnosis not present

## 2019-04-08 DIAGNOSIS — M47816 Spondylosis without myelopathy or radiculopathy, lumbar region: Secondary | ICD-10-CM | POA: Diagnosis not present

## 2019-04-10 DIAGNOSIS — M189 Osteoarthritis of first carpometacarpal joint, unspecified: Secondary | ICD-10-CM | POA: Insufficient documentation

## 2019-04-28 ENCOUNTER — Other Ambulatory Visit: Payer: Self-pay

## 2019-04-28 ENCOUNTER — Ambulatory Visit (INDEPENDENT_AMBULATORY_CARE_PROVIDER_SITE_OTHER): Payer: Managed Care, Other (non HMO) | Admitting: Family

## 2019-04-28 ENCOUNTER — Encounter: Payer: Self-pay | Admitting: Family

## 2019-04-28 ENCOUNTER — Other Ambulatory Visit: Payer: BLUE CROSS/BLUE SHIELD

## 2019-04-28 DIAGNOSIS — F17200 Nicotine dependence, unspecified, uncomplicated: Secondary | ICD-10-CM | POA: Diagnosis not present

## 2019-04-28 DIAGNOSIS — Z20822 Contact with and (suspected) exposure to covid-19: Secondary | ICD-10-CM

## 2019-04-28 DIAGNOSIS — J441 Chronic obstructive pulmonary disease with (acute) exacerbation: Secondary | ICD-10-CM

## 2019-04-28 MED ORDER — ALBUTEROL SULFATE HFA 108 (90 BASE) MCG/ACT IN AERS: INHALATION_SPRAY | RESPIRATORY_TRACT | 0 refills | Status: DC

## 2019-04-28 MED ORDER — BENZONATATE 200 MG PO CAPS: 200.0000 mg | ORAL_CAPSULE | Freq: Three times a day (TID) | ORAL | 1 refills | Status: DC | PRN

## 2019-04-28 NOTE — Progress Notes (Signed)
   Virtual Visit via telephone Note  I connected with Lori Schroeder on 04/28/19 at 9:31 AM by telephone and verified that I am speaking with the correct person using two identifiers. Lori Schroeder is currently located at home and granddaughter is currently with her during visit. The provider, [name], FNP is located in their office at time of visit.  I discussed the limitations, risks, security and privacy concerns of performing an evaluation and management service by telephone and the availability of in person appointments. I also discussed with the patient that there may be a patient responsible charge related to this service. The patient expressed understanding and agreed to proceed.   History and Present Illness:  Cough This is a new problem. The current episode started yesterday. The problem has been waxing and waning. The problem occurs every few minutes. The cough is productive of sputum. Associated symptoms include a sore throat, shortness of breath and wheezing. Pertinent negatives include no chills, ear congestion, ear pain, fever, headaches, myalgias, nasal congestion, postnasal drip or rhinorrhea. The symptoms are aggravated by lying down. Risk factors for lung disease include smoking/tobacco exposure. She has tried rest and OTC cough suppressant for the symptoms. The treatment provided mild relief. Her past medical history is significant for COPD.      Review of Systems  Constitutional: Negative for chills and fever.  HENT: Positive for sore throat. Negative for ear pain, postnasal drip and rhinorrhea.   Respiratory: Positive for cough, shortness of breath and wheezing.   Musculoskeletal: Negative for myalgias.  Neurological: Negative for headaches.  All other systems reviewed and are negative.    Observations/Objective: No SOB or distress noted   Assessment and Plan: 1. COPD exacerbation ([place]) Will do COVID testing to rule out Need to self isolate until results return  Smoking cessation discussed - Take meds as prescribed - Use a cool mist humidifier  -Use saline nose sprays frequently -Force fluids -For any cough or congestion  Use plain Mucinex- regular strength or max strength is fine -For fever or aces or pains- take tylenol or ibuprofen. -RTO if symptoms worsen or do not improve  - Novel Coronavirus, NAA ([facility]) - benzonatate (TESSALON) 200 MG capsule; Take 1 capsule (200 mg total) by mouth 3 (three) times daily as needed.  Dispense: 30 capsule; Refill: 1 - albuterol (PROAIR HFA) 108 (90 Base) MCG/ACT inhaler; 2 PUFFS EVERY 6 HOURS AS NEEDED FOR WHEEZING OR SHORTNESS OF BREATH  Dispense: 8.5 g; Refill: 0  2. Current smoker Smoking cessation discussed     I discussed the assessment and treatment plan with the patient. The patient was provided an opportunity to ask questions and all were answered. The patient agreed with the plan and demonstrated an understanding of the instructions.   The patient was advised to call back or seek an in-person evaluation if the symptoms worsen or if the condition fails to improve as anticipated.  The above assessment and management plan was discussed with the patient. The patient verbalized understanding of and has agreed to the management plan. Patient is aware to call the clinic if symptoms persist or worsen. Patient is aware when to return to the clinic for a follow-up visit. Patient educated on when it is appropriate to go to the emergency department.   Time call ended:  9:41 AM   I provided 10 minutes of non-face-to-face time during this encounter.    [name], FNP

## 2019-04-30 LAB — NOVEL CORONAVIRUS, NAA: SARS-CoV-2, NAA: NOT DETECTED

## 2019-05-15 DIAGNOSIS — M961 Postlaminectomy syndrome, not elsewhere classified: Secondary | ICD-10-CM | POA: Diagnosis not present

## 2019-05-15 DIAGNOSIS — M5416 Radiculopathy, lumbar region: Secondary | ICD-10-CM | POA: Diagnosis not present

## 2019-05-15 DIAGNOSIS — G894 Chronic pain syndrome: Secondary | ICD-10-CM | POA: Diagnosis not present

## 2019-05-16 ENCOUNTER — Ambulatory Visit (INDEPENDENT_AMBULATORY_CARE_PROVIDER_SITE_OTHER): Payer: Managed Care, Other (non HMO) | Admitting: Family Medicine

## 2019-05-16 ENCOUNTER — Other Ambulatory Visit: Payer: Self-pay

## 2019-05-16 DIAGNOSIS — R05 Cough: Secondary | ICD-10-CM

## 2019-05-16 DIAGNOSIS — R058 Other specified cough: Secondary | ICD-10-CM

## 2019-05-16 MED ORDER — PREDNISONE 20 MG PO TABS: 40.0000 mg | ORAL_TABLET | Freq: Every day | ORAL | 0 refills | Status: AC

## 2019-05-16 MED ORDER — AZITHROMYCIN 250 MG PO TABS: ORAL_TABLET | ORAL | 0 refills | Status: DC

## 2019-05-16 NOTE — Progress Notes (Signed)
Telephone visit  Subjective: CC: cough PCP: [name], DO PJK:[id number] Lori Schroeder is a 63 y.o. female calls for telephone consult today. Patient provides verbal consent for consult held via phone.  Location of patient: home Location of provider: Working remotely from home Others present for call: none  1. Cough Patient was evaluated via televisit 04/28/2019 and she was prescribed albuterol and tessalon perles.  She reports that symptoms have not improved.  She is a smoker.  She reports ongoing coughing, wheezing.  She has been using the albuterol inhaler as prescribed.  She actually put off her back injection secondary to coughing, wheezing and sensation of shortness of breath.  She was worried that she would have issues with the injection if she had to be put under.   ROS: Per HPI  No Known Allergies Past Medical History:  Diagnosis Date  . Blood transfusion without reported diagnosis   . External hemorrhoids without mention of complication [id number]   Colonoscopy  . Family history of malignant neoplasm of gastrointestinal tract   . Family history of malignant neoplasm of gastrointestinal tract   . GERD (gastroesophageal reflux disease)   . Hyperlipemia   . Hypertension     Current Outpatient Medications:  .  albuterol (PROAIR HFA) 108 (90 Base) MCG/ACT inhaler, 2 PUFFS EVERY 6 HOURS AS NEEDED FOR WHEEZING OR SHORTNESS OF BREATH, Disp: 8.5 g, Rfl: 0 .  atorvastatin (LIPITOR) 10 MG tablet, Take 1 tablet (10 mg total) by mouth daily., Disp: 90 tablet, Rfl: 3 .  benzonatate (TESSALON) 200 MG capsule, Take 1 capsule (200 mg total) by mouth 3 (three) times daily as needed., Disp: 30 capsule, Rfl: 1 .  Dulaglutide (TRULICITY) 1.5 XI/3.3AS SOPN, Inject 1.5 mg into the skin once a week., Disp: 12 pen, Rfl: 4 .  esomeprazole (NEXIUM) 40 MG capsule, TAKE  (1)  CAPSULE  TWICE DAILY., Disp: 180 capsule, Rfl: 1 .  fluticasone (FLONASE) 50 MCG/ACT nasal spray, Place 2 sprays into both  nostrils daily. (Patient not taking: Reported on 03/19/2019), Disp: 16 g, Rfl: 6 .  lisinopril (ZESTRIL) 5 MG tablet, Take 1 tablet (5 mg total) by mouth daily., Disp: 90 tablet, Rfl: 3 .  naloxone (NARCAN) nasal spray 4 mg/0.1 mL, Place 1 spray into the nose once., Disp: , Rfl:  .  NUCYNTA 75 MG tablet, Take 75 mg by mouth 3 (three) times daily as needed. , Disp: , Rfl: 0 .  pregabalin (LYRICA) 300 MG capsule, Take 300 mg by mouth daily. , Disp: , Rfl:  .  tiZANidine (ZANAFLEX) 4 MG tablet, TAKE ONE TABLET BY MOUTH THREE TIMES DAILY AS NEEDED FOR MUSCLE SPASM, Disp: , Rfl:  .  venlafaxine XR (EFFEXOR-XR) 75 MG 24 hr capsule, venlafaxine ER 75 mg capsule,extended release 24 hr, Disp: , Rfl:   Assessment/ Plan: 63 y.o. female   1. Cough present for greater than 3 weeks Suspected COPD exacerbation at last visit in July but was not placed on antibiotics or steroids and therefore I will go ahead and proceed with this.  We discussed that if symptoms are not responsive to the therapies that we should consider chest x-ray, particularly given longstanding history of smoking.  I advised her to use nicotine 7 mg patches applied once daily and removed prior to bedtime.  She can do this for a month and hopefully come off of cigarettes totally.  She is motivated to stop smoking.  Additionally, I notified her that her COVID test was negative.  Apparently  she had not received these results.  If no significant improvement in the next week or so, we discussed her call me back and we will proceed with chest x-ray. - azithromycin (ZITHROMAX) 250 MG tablet; Take 2 tablets today, then take 1 tablet daily until gone.  Dispense: 6 tablet; Refill: 0 - predniSONE (DELTASONE) 20 MG tablet; Take 2 tablets (40 mg total) by mouth daily with breakfast for 5 days.  Dispense: 10 tablet; Refill: 0   Start time: 11:20am End time: 11:31am  Total time spent on patient care (including telephone call/ virtual visit): 16  minutes  [place], [place] [phone]

## 2019-05-29 DIAGNOSIS — M65321 Trigger finger, right index finger: Secondary | ICD-10-CM | POA: Diagnosis not present

## 2019-05-29 DIAGNOSIS — M1812 Unilateral primary osteoarthritis of first carpometacarpal joint, left hand: Secondary | ICD-10-CM | POA: Diagnosis not present

## 2019-05-29 DIAGNOSIS — Z5189 Encounter for other specified aftercare: Secondary | ICD-10-CM | POA: Diagnosis not present

## 2019-05-29 DIAGNOSIS — M654 Radial styloid tenosynovitis [de Quervain]: Secondary | ICD-10-CM | POA: Diagnosis not present

## 2019-05-31 ENCOUNTER — Other Ambulatory Visit: Payer: Self-pay | Admitting: Family Medicine

## 2019-06-02 ENCOUNTER — Ambulatory Visit (INDEPENDENT_AMBULATORY_CARE_PROVIDER_SITE_OTHER): Payer: Managed Care, Other (non HMO) | Admitting: Family Medicine

## 2019-06-02 DIAGNOSIS — H60331 Swimmer's ear, right ear: Secondary | ICD-10-CM | POA: Diagnosis not present

## 2019-06-02 MED ORDER — CIPRODEX 0.3-0.1 % OT SUSP: 4.0000 [drp] | Freq: Two times a day (BID) | OTIC | 0 refills | Status: AC

## 2019-06-02 NOTE — Progress Notes (Signed)
Telephone visit  Subjective: CC: ear pain PCP: [name], DO [id number] Lori Schroeder is a 63 y.o. female calls for telephone consult today. Patient provides verbal consent for consult held via phone.  Location of patient: home Location of provider: Working remotely from home Others present for call: none  1. Otalgia Patient reports several day history of right-sided otalgia that radiates to the right side of her neck.  She does note that last Monday she had her teeth pulled and several stitches placed.  She is supposed to get them taken out today but somebody tested positive for COVID-19 at her dentist office.  She denies any foul taste in her mouth.  She is not seen any purulence or had any fevers.  Overall her respiratory symptoms have gotten quite a bit improved after use of Z-Pak and prednisone.  Her blood sugars are remaining below 140 despite use of the steroid.     ROS: Per HPI  No Known Allergies Past Medical History:  Diagnosis Date  . Blood transfusion without reported diagnosis   . External hemorrhoids without mention of complication [id number]   Colonoscopy  . Family history of malignant neoplasm of gastrointestinal tract   . Family history of malignant neoplasm of gastrointestinal tract   . GERD (gastroesophageal reflux disease)   . Hyperlipemia   . Hypertension     Current Outpatient Medications:  .  albuterol (PROAIR HFA) 108 (90 Base) MCG/ACT inhaler, 2 PUFFS EVERY 6 HOURS AS NEEDED FOR WHEEZING OR SHORTNESS OF BREATH, Disp: 8.5 g, Rfl: 0 .  atorvastatin (LIPITOR) 10 MG tablet, Take 1 tablet (10 mg total) by mouth daily., Disp: 90 tablet, Rfl: 3 .  azithromycin (ZITHROMAX) 250 MG tablet, Take 2 tablets today, then take 1 tablet daily until gone., Disp: 6 tablet, Rfl: 0 .  benzonatate (TESSALON) 200 MG capsule, Take 1 capsule (200 mg total) by mouth 3 (three) times daily as needed., Disp: 30 capsule, Rfl: 1 .  esomeprazole (NEXIUM) 40 MG capsule, TAKE  (1)  CAPSULE   TWICE DAILY., Disp: 180 capsule, Rfl: 1 .  fluticasone (FLONASE) 50 MCG/ACT nasal spray, Place 2 sprays into both nostrils daily. (Patient not taking: Reported on 03/19/2019), Disp: 16 g, Rfl: 6 .  lisinopril (ZESTRIL) 5 MG tablet, Take 1 tablet (5 mg total) by mouth daily., Disp: 90 tablet, Rfl: 3 .  naloxone (NARCAN) nasal spray 4 mg/0.1 mL, Place 1 spray into the nose once., Disp: , Rfl:  .  NUCYNTA 75 MG tablet, Take 75 mg by mouth 3 (three) times daily as needed. , Disp: , Rfl: 0 .  pregabalin (LYRICA) 300 MG capsule, Take 300 mg by mouth daily. , Disp: , Rfl:  .  tiZANidine (ZANAFLEX) 4 MG tablet, TAKE ONE TABLET BY MOUTH THREE TIMES DAILY AS NEEDED FOR MUSCLE SPASM, Disp: , Rfl:  .  TRULICITY 1.5 0000000 SOPN, Inject 1.5 mg into the skin once a week., Disp: 2 mL, Rfl: 0 .  venlafaxine XR (EFFEXOR-XR) 75 MG 24 hr capsule, venlafaxine ER 75 mg capsule,extended release 24 hr, Disp: , Rfl:   Assessment/ Plan: 63 y.o. female   1. Acute swimmer's ear of right side I am going to empirically treat her with a topical steroid and antibiotic to cover for possible ear infection.  I have also recommended that she use heat/ice to the affected areas.  This may certainly be a eustachian tube drainage issue versus related to her recent tooth removal.  I have recommended that she follow-up  with her dentist for direct visualization of her recent extraction.  Differential diagnosis includes dry socket. - ciprofloxacin-dexamethasone (CIPRODEX) OTIC suspension; Place 4 drops into the right ear 2 (two) times daily for 7 days.  Dispense: 7.5 mL; Refill: 0   Start time: 4:02pm End time: 4:07pm  Total time spent on patient care (including telephone call/ virtual visit): 11 minutes  [place], [place] [phone]

## 2019-06-02 NOTE — Patient Instructions (Signed)

## 2019-06-05 DIAGNOSIS — G894 Chronic pain syndrome: Secondary | ICD-10-CM | POA: Diagnosis not present

## 2019-06-05 DIAGNOSIS — M961 Postlaminectomy syndrome, not elsewhere classified: Secondary | ICD-10-CM | POA: Diagnosis not present

## 2019-06-05 DIAGNOSIS — M5416 Radiculopathy, lumbar region: Secondary | ICD-10-CM | POA: Diagnosis not present

## 2019-06-10 ENCOUNTER — Ambulatory Visit (INDEPENDENT_AMBULATORY_CARE_PROVIDER_SITE_OTHER): Payer: Managed Care, Other (non HMO) | Admitting: Family Medicine

## 2019-06-10 DIAGNOSIS — H9201 Otalgia, right ear: Secondary | ICD-10-CM | POA: Diagnosis not present

## 2019-06-10 MED ORDER — AMOXICILLIN-POT CLAVULANATE 875-125 MG PO TABS: 1.0000 | ORAL_TABLET | Freq: Two times a day (BID) | ORAL | 0 refills | Status: DC

## 2019-06-10 NOTE — Patient Instructions (Signed)
Otitis Media, Adult  Otitis media means that the middle ear is red and swollen (inflamed) and full of fluid. The condition usually goes away on its own. Follow these instructions at home:  Take over-the-counter and prescription medicines only as told by your doctor.  If you were prescribed an antibiotic medicine, take it as told by your doctor. Do not stop taking the antibiotic even if you start to feel better.  Keep all follow-up visits as told by your doctor. This is important. Contact a doctor if:  You have bleeding from your nose.  There is a lump on your neck.  You are not getting better in 5 days.  You feel worse instead of better. Get help right away if:  You have pain that is not helped with medicine.  You have swelling, redness, or pain around your ear.  You get a stiff neck.  You cannot move part of your face (paralyzed).  You notice that the bone behind your ear hurts when you touch it.  You get a very bad headache. Summary  Otitis media means that the middle ear is red, swollen, and full of fluid.  This condition usually goes away on its own. In some cases, treatment may be needed.  If you were prescribed an antibiotic medicine, take it as told by your doctor. This information is not intended to replace advice given to you by your health care provider. Make sure you discuss any questions you have with your health care provider. Document Released: 03/06/2008 Document Revised: 08/31/2017 Document Reviewed: 10/09/2016 Elsevier Patient Education  2020 Else[employer] Inc[employer]  

## 2019-06-10 NOTE — Progress Notes (Signed)
Telephone visit  Subjective: CC: Otalgia PCP: [name], DO TX:[id number] [place] is a 63 y.o. female calls for telephone consult today. Patient provides verbal consent for consult held via phone.  Location of patient: home Location of provider: [facility] Others present for call: none  1.  Otalgia Patient was seen 1 week ago for right-sided otalgia.  She was empirically treated with a topical antibiotic and steroid for presumed otitis externa.  She has ongoing pain despite use of medication as directed.  Of note she was treated with a Z-Pak and steroids shortly before her last visit for cough.  She had previous COVID-19 testing which was negative.  She denies any fevers or dizziness.  No drainage.   ROS: Per HPI  No Known Allergies Past Medical History:  Diagnosis Date  . Blood transfusion without reported diagnosis   . External hemorrhoids without mention of complication 123XX123   Colonoscopy  . Family history of malignant neoplasm of gastrointestinal tract   . Family history of malignant neoplasm of gastrointestinal tract   . GERD (gastroesophageal reflux disease)   . Hyperlipemia   . Hypertension     Current Outpatient Medications:  .  albuterol (PROAIR HFA) 108 (90 Base) MCG/ACT inhaler, 2 PUFFS EVERY 6 HOURS AS NEEDED FOR WHEEZING OR SHORTNESS OF BREATH, Disp: 8.5 g, Rfl: 0 .  atorvastatin (LIPITOR) 10 MG tablet, Take 1 tablet (10 mg total) by mouth daily., Disp: 90 tablet, Rfl: 3 .  azithromycin (ZITHROMAX) 250 MG tablet, Take 2 tablets today, then take 1 tablet daily until gone., Disp: 6 tablet, Rfl: 0 .  benzonatate (TESSALON) 200 MG capsule, Take 1 capsule (200 mg total) by mouth 3 (three) times daily as needed., Disp: 30 capsule, Rfl: 1 .  esomeprazole (NEXIUM) 40 MG capsule, TAKE  (1)  CAPSULE  TWICE DAILY., Disp: 180 capsule, Rfl: 1 .  fluticasone (FLONASE) 50 MCG/ACT nasal spray, Place 2 sprays into both nostrils daily. (Patient not taking: Reported on 03/19/2019),  Disp: 16 g, Rfl: 6 .  lisinopril (ZESTRIL) 5 MG tablet, Take 1 tablet (5 mg total) by mouth daily., Disp: 90 tablet, Rfl: 3 .  naloxone (NARCAN) nasal spray 4 mg/0.1 mL, Place 1 spray into the nose once., Disp: , Rfl:  .  NUCYNTA 75 MG tablet, Take 75 mg by mouth 3 (three) times daily as needed. , Disp: , Rfl: 0 .  pregabalin (LYRICA) 300 MG capsule, Take 300 mg by mouth daily. , Disp: , Rfl:  .  tiZANidine (ZANAFLEX) 4 MG tablet, TAKE ONE TABLET BY MOUTH THREE TIMES DAILY AS NEEDED FOR MUSCLE SPASM, Disp: , Rfl:  .  TRULICITY 1.5 0000000 SOPN, Inject 1.5 mg into the skin once a week., Disp: 2 mL, Rfl: 0 .  venlafaxine XR (EFFEXOR-XR) 75 MG 24 hr capsule, venlafaxine ER 75 mg capsule,extended release 24 hr, Disp: , Rfl:   Assessment/ Plan: 63 y.o. female   1. Otalgia, right Not responding well to eardrops and therefore I will transition her over to oral antibiotics to cover for otitis media.  Of note, she is status post Z-Pak and steroids about a month ago for upper respiratory infection.  We discussed return precautions.  May need to consider repeat COVID-19 testing if ongoing symptoms. - amoxicillin-clavulanate (AUGMENTIN) 875-125 MG tablet; Take 1 tablet by mouth 2 (two) times daily.  Dispense: 20 tablet; Refill: 0   Start time: 2:24pm End time: 2:27pm  Total time spent on patient care (including telephone call/ virtual visit): 7  minutes  [name], DO [place] [phone]

## 2019-06-13 ENCOUNTER — Ambulatory Visit: Payer: Medicare Other | Admitting: Family Medicine

## 2019-07-01 LAB — HM DIABETES EYE EXAM

## 2019-07-08 DIAGNOSIS — M47816 Spondylosis without myelopathy or radiculopathy, lumbar region: Secondary | ICD-10-CM | POA: Diagnosis not present

## 2019-07-08 DIAGNOSIS — M545 Low back pain: Secondary | ICD-10-CM | POA: Diagnosis not present

## 2019-07-08 DIAGNOSIS — M5416 Radiculopathy, lumbar region: Secondary | ICD-10-CM | POA: Diagnosis not present

## 2019-07-08 DIAGNOSIS — G894 Chronic pain syndrome: Secondary | ICD-10-CM | POA: Diagnosis not present

## 2019-07-09 ENCOUNTER — Other Ambulatory Visit: Payer: Self-pay

## 2019-07-10 ENCOUNTER — Other Ambulatory Visit: Payer: Self-pay | Admitting: Family Medicine

## 2019-07-10 ENCOUNTER — Other Ambulatory Visit: Payer: Self-pay

## 2019-07-11 ENCOUNTER — Ambulatory Visit: Payer: Medicare Other | Admitting: Family Medicine

## 2019-07-11 MED ORDER — TRULICITY 1.5 MG/0.5ML ~~LOC~~ SOAJ: SUBCUTANEOUS | 0 refills | Status: DC

## 2019-07-11 NOTE — Addendum Note (Signed)
Addended by: Zannie Cove on: 07/11/2019 11:29 AM   Modules accepted: Orders

## 2019-07-11 NOTE — Telephone Encounter (Signed)
Called and appt  Made

## 2019-07-11 NOTE — Telephone Encounter (Signed)
Gottschalk. NTBS 30 days given 06/02/19

## 2019-07-16 ENCOUNTER — Other Ambulatory Visit: Payer: Self-pay

## 2019-07-17 ENCOUNTER — Other Ambulatory Visit: Payer: Self-pay

## 2019-07-17 ENCOUNTER — Encounter: Payer: Self-pay | Admitting: Family Medicine

## 2019-07-17 ENCOUNTER — Ambulatory Visit (INDEPENDENT_AMBULATORY_CARE_PROVIDER_SITE_OTHER): Payer: Managed Care, Other (non HMO) | Admitting: Family Medicine

## 2019-07-17 VITALS — BP 122/71 | HR 68 | Temp 97.1°F | Ht 59.0 in | Wt 122.0 lb

## 2019-07-17 DIAGNOSIS — I10 Essential (primary) hypertension: Secondary | ICD-10-CM | POA: Diagnosis not present

## 2019-07-17 DIAGNOSIS — I152 Hypertension secondary to endocrine disorders: Secondary | ICD-10-CM

## 2019-07-17 DIAGNOSIS — E1165 Type 2 diabetes mellitus with hyperglycemia: Secondary | ICD-10-CM | POA: Diagnosis not present

## 2019-07-17 DIAGNOSIS — E785 Hyperlipidemia, unspecified: Secondary | ICD-10-CM

## 2019-07-17 DIAGNOSIS — E1159 Type 2 diabetes mellitus with other circulatory complications: Secondary | ICD-10-CM | POA: Diagnosis not present

## 2019-07-17 DIAGNOSIS — E1169 Type 2 diabetes mellitus with other specified complication: Secondary | ICD-10-CM | POA: Diagnosis not present

## 2019-07-17 DIAGNOSIS — E119 Type 2 diabetes mellitus without complications: Secondary | ICD-10-CM

## 2019-07-17 DIAGNOSIS — M25532 Pain in left wrist: Secondary | ICD-10-CM

## 2019-07-17 LAB — BAYER DCA HB A1C WAIVED: HB A1C (BAYER DCA - WAIVED): 6.2 % (ref <7.0)

## 2019-07-17 MED ORDER — TRULICITY 1.5 MG/0.5ML ~~LOC~~ SOAJ: SUBCUTANEOUS | 12 refills | Status: DC

## 2019-07-17 MED ORDER — DICLOFENAC SODIUM 1 % TD GEL: 4.0000 g | Freq: Four times a day (QID) | TRANSDERMAL | 3 refills | Status: DC

## 2019-07-17 NOTE — Progress Notes (Signed)
Subjective: CC: DM2 f/u PCP: Lori Norlander, DO TX:[id number] Lori Schroeder is a 63 y.o. female presenting to clinic today for:  1. Type 2 Diabetes with hypertension and hyperlipidemia:  Patient is doing okay.  She had several teeth pulled over the last several weeks and notes some discomfort regarding that but she had her second set of stitches out and seems to be on the upswing of things.  She does admit to eating quite a bit more carbs then her normal, citing that she is only been able to eat puddings and potatoes and things that are soft due to the dental work.  She is compliant with the Trulicity every week, her lisinopril 5 mg and her Lipitor 10 mg daily  Last eye exam: Just had done Last foot exam: needs Last A1c:  Lab Results  Component Value Date   HGBA1C 6.2 07/17/2019   Nephropathy screen indicated?: UTD Last flu, zoster and/or pneumovax:  Immunization History  Administered Date(s) Administered  . Pneumococcal Conjugate-13 05/29/2018  . Tdap 05/29/2018    ROS: Denies dizziness, foot ulcerations, numbness or tingling in the lower extremities outside of normal.  2.  Left wrist pain Patient reports longstanding history of left wrist pain.  She gets occasional corticosteroid injections to the left wrist but notes they only last about 2 weeks.  She is wondering if there are alternative therapies that she can use in efforts to control pain.  She is currently treated with both Lyrica and Nucynta for chronic pain with her orthopedist.  ROS: Per HPI  No Known Allergies Past Medical History:  Diagnosis Date  . Blood transfusion without reported diagnosis   . External hemorrhoids without mention of complication 123XX123   Colonoscopy  . Family history of malignant neoplasm of gastrointestinal tract   . Family history of malignant neoplasm of gastrointestinal tract   . GERD (gastroesophageal reflux disease)   . Hyperlipemia   . Hypertension     Current Outpatient Medications:   .  albuterol (PROAIR HFA) 108 (90 Base) MCG/ACT inhaler, 2 PUFFS EVERY 6 HOURS AS NEEDED FOR WHEEZING OR SHORTNESS OF BREATH, Disp: 8.5 g, Rfl: 0 .  atorvastatin (LIPITOR) 10 MG tablet, Take 1 tablet (10 mg total) by mouth daily., Disp: 90 tablet, Rfl: 3 .  Dulaglutide (TRULICITY) 1.5 0000000 SOPN, Inject 1.5 mg into the skin once a week., Disp: 2 mL, Rfl: 12 .  esomeprazole (NEXIUM) 40 MG capsule, TAKE  (1)  CAPSULE  TWICE DAILY., Disp: 180 capsule, Rfl: 1 .  fluticasone (FLONASE) 50 MCG/ACT nasal spray, Place 2 sprays into both nostrils daily., Disp: 16 g, Rfl: 6 .  lisinopril (ZESTRIL) 5 MG tablet, Take 1 tablet (5 mg total) by mouth daily., Disp: 90 tablet, Rfl: 3 .  naloxone (NARCAN) nasal spray 4 mg/0.1 mL, Place 1 spray into the nose once., Disp: , Rfl:  .  NUCYNTA 75 MG tablet, Take 75 mg by mouth 3 (three) times daily as needed. , Disp: , Rfl: 0 .  pregabalin (LYRICA) 300 MG capsule, Take 300 mg by mouth daily. , Disp: , Rfl:  .  tiZANidine (ZANAFLEX) 4 MG tablet, TAKE ONE TABLET BY MOUTH THREE TIMES DAILY AS NEEDED FOR MUSCLE SPASM, Disp: , Rfl:  .  venlafaxine XR (EFFEXOR-XR) 75 MG 24 hr capsule, venlafaxine ER 75 mg capsule,extended release 24 hr, Disp: , Rfl:  .  diclofenac sodium (VOLTAREN) 1 % GEL, Apply 4 g topically 4 (four) times daily., Disp: 400 g, Rfl: 3 Social  History   Socioeconomic History  . Marital status: Married    Spouse name: Not on file  . Number of children: 2  . Years of education: 11  . Highest education level: Associate degree: occupational, Hotel manager, or vocational program  Occupational History  . Occupation: disability    Employer: DISABLED  Social Needs  . Financial resource strain: Not hard at all  . Food insecurity    Worry: Never true    Inability: Never true  . Transportation needs    Medical: No    Non-medical: No  Tobacco Use  . Smoking status: Current Every Day Smoker    Packs/day: 0.50    Years: 10.00    Pack years: 5.00    Types:  Cigarettes  . Smokeless tobacco: Never Used  Substance and Sexual Activity  . Alcohol use: No  . Drug use: No  . Sexual activity: Not on file  Lifestyle  . Physical activity    Days per week: 0 days    Minutes per session: 0 min  . Stress: Not at all  Relationships  . Social connections    Talks on phone: More than three times a week    Gets together: More than three times a week    Attends religious service: More than 4 times per year    Active member of club or organization: No    Attends meetings of clubs or organizations: Never    Relationship status: Married  . Intimate partner violence    Fear of current or ex partner: No    Emotionally abused: No    Physically abused: No    Forced sexual activity: No  Other Topics Concern  . Not on file  Social History Narrative  . Not on file   Family History  Problem Relation Age of Onset  . Colon cancer Maternal Grandmother   . Alzheimer's disease Mother   . Hyperlipidemia Mother   . Hypertension Mother   . Diabetes Daughter        Type 1  . Breast cancer Neg Hx   . Celiac disease Neg Hx   . Cirrhosis Neg Hx   . Clotting disorder Neg Hx   . Colitis Neg Hx   . Colon polyps Neg Hx   . Crohn's disease Neg Hx   . Cystic fibrosis Neg Hx   . Esophageal cancer Neg Hx   . Heart disease Neg Hx   . Hemochromatosis Neg Hx   . Inflammatory bowel disease Neg Hx   . Irritable bowel syndrome Neg Hx   . Kidney disease Neg Hx   . Liver cancer Neg Hx   . Liver disease Neg Hx   . Ovarian cancer Neg Hx   . Pancreatic cancer Neg Hx   . Prostate cancer Neg Hx   . Rectal cancer Neg Hx   . Stomach cancer Neg Hx   . Ulcerative colitis Neg Hx   . Uterine cancer Neg Hx   . Wilson's disease Neg Hx     Objective: Office vital signs reviewed. BP 122/71   Pulse 68   Temp (!) 97.1 F (36.2 C) (Temporal)   Ht 4\' 11"  (1.499 m)   Wt 122 lb (55.3 kg)   SpO2 99%   BMI 24.64 kg/m   Physical Examination:  General: Awake, alert, well  nourished, No acute distress HEENT: Normal. MMM Cardio: regular rate and rhythm, S1S2 heard, no murmurs appreciated Pulm: clear to auscultation bilaterally, no wheezes, rhonchi or rales;  normal work of breathing on room air Extremities: warm, well perfused, No edema, cyanosis or clubbing; +2 pulses bilaterally MSK: normal gait and station; left wrist with no gross deformity.  No gross swelling or palpable soft tissue masses.  She has intact range of motion Skin: dry; intact; Onychomycotic changes noted to the great toenails bilaterally Neuro: see DM foot Diabetic Foot Exam - Simple   Simple Foot Form Diabetic Foot exam was performed with the following findings: Yes 07/17/2019  4:58 PM  Visual Inspection No deformities, no ulcerations, no other skin breakdown bilaterally: Yes Sensation Testing Intact to touch and monofilament testing bilaterally: Yes Pulse Check Posterior Tibialis and Dorsalis pulse intact bilaterally: Yes Comments     Assessment/ Plan: 63 y.o. female   1. Controlled type 2 diabetes mellitus without complication, without long-term current use of insulin (HCC) A1c under excellent control at 6.2 today.  Continue current regimen with Trulicity - Bayer DCA Hb A1c Waived - Dulaglutide (TRULICITY) 1.5 0000000 SOPN; Inject 1.5 mg into the skin once a week.  Dispense: 2 mL; Refill: 12  2. Hyperlipidemia associated with type 2 diabetes mellitus (HCC) Continue statin.  3. Hypertension associated with diabetes ([place]) Controlled. Continue current regimen.  4. Left wrist pain Start Voltaren gel topically.  Continue pain medications as prescribed by pain doctor. - diclofenac sodium (VOLTAREN) 1 % GEL; Apply 4 g topically 4 (four) times daily.  Dispense: 400 g; Refill: 3   Orders Placed This Encounter  Procedures  . Bayer DCA Hb A1c Waived   Meds ordered this encounter  Medications  . Dulaglutide (TRULICITY) 1.5 0000000 SOPN    Sig: Inject 1.5 mg into the skin once a  week.    Dispense:  2 mL    Refill:  12  . diclofenac sodium (VOLTAREN) 1 % GEL    Sig: Apply 4 g topically 4 (four) times daily.    Dispense:  400 g    Refill:  [place], [place] [phone]

## 2019-07-18 ENCOUNTER — Telehealth: Payer: Self-pay | Admitting: Family Medicine

## 2019-07-18 NOTE — Telephone Encounter (Signed)
Voltaren GEL will NOT raise blood pressure because it is topical.  That's why we picked the GEL formulation.  The PILL WILL raise blood pressure.  Ok to proceed with GEL as prescribed.

## 2019-07-18 NOTE — Telephone Encounter (Signed)
Patient aware.

## 2019-08-07 IMAGING — DX DG FINGER LITTLE 2+V*R*
3 series · 3 of 3 positions shown · non-contrast
Comparison: Right hand films of 12/19/2016

CLINICAL DATA: Fell last night with pain

EXAM:
RIGHT LITTLE FINGER 2+V

[finger ap]
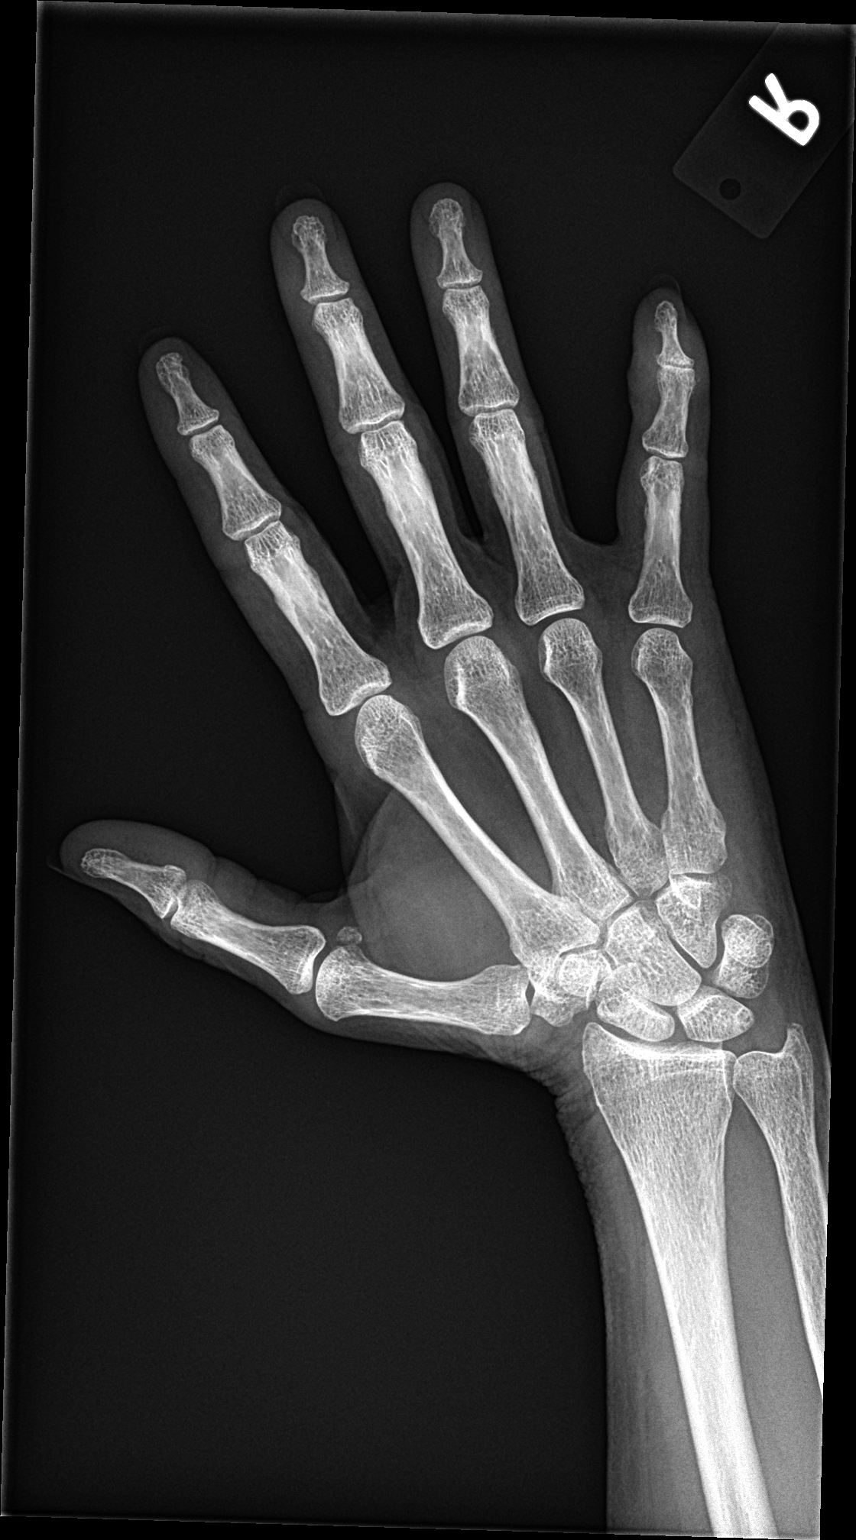

[finger obl]
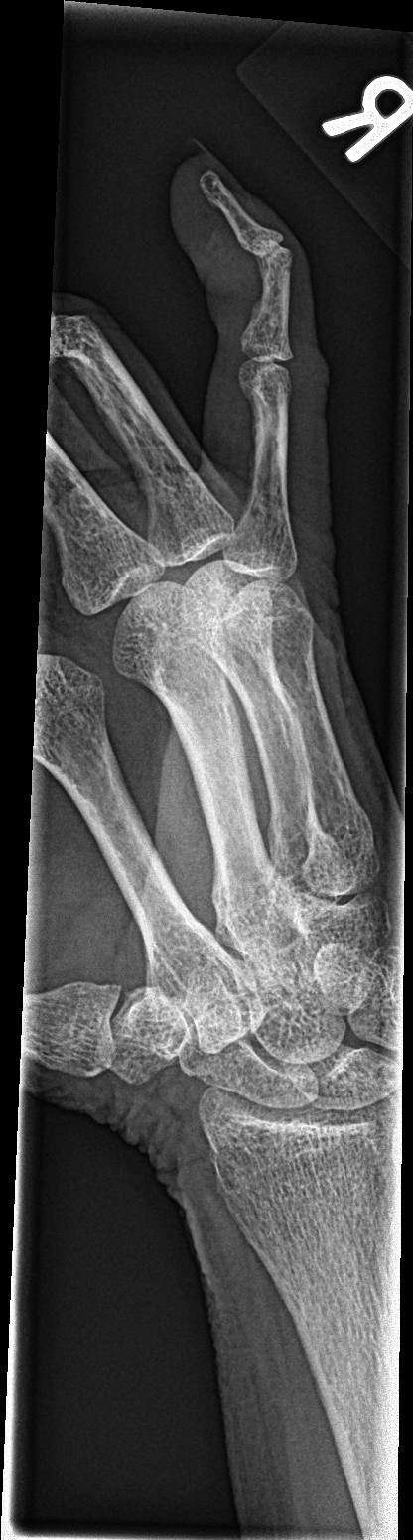

[finger lat]
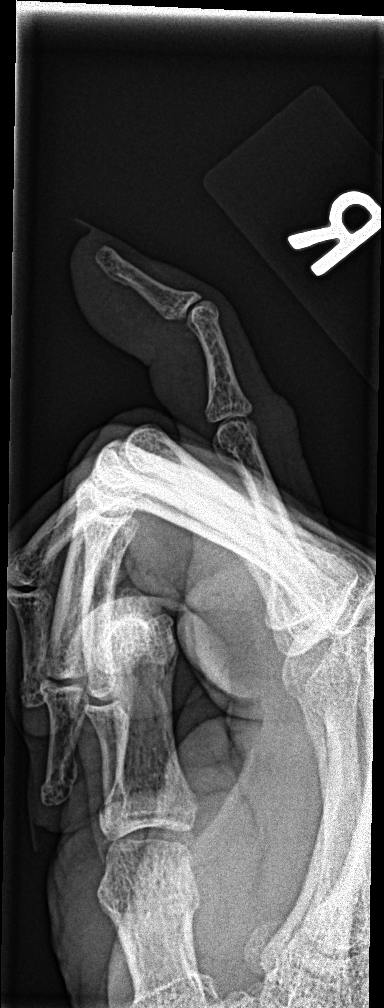

[3 of 3 positions shown; findings below may reference images not displayed]

FINDINGS: The radiocarpal joint space appears normal. There is no change in
the appearance of the ulnar styloid. The carpal bones are normal
position. There does appear to be soft tissue swelling around the
right fifth DIP joint but no fracture is seen.
IMPRESSION: No acute fracture. Mild soft tissue swelling of the right fifth DIP
joint.

## 2019-09-19 ENCOUNTER — Encounter: Payer: Self-pay | Admitting: Physician Assistant

## 2019-09-19 ENCOUNTER — Ambulatory Visit (INDEPENDENT_AMBULATORY_CARE_PROVIDER_SITE_OTHER): Payer: Managed Care, Other (non HMO) | Admitting: Physician Assistant

## 2019-09-19 DIAGNOSIS — J029 Acute pharyngitis, unspecified: Secondary | ICD-10-CM | POA: Diagnosis not present

## 2019-09-19 MED ORDER — AMOXICILLIN 500 MG PO CAPS: 500.0000 mg | ORAL_CAPSULE | Freq: Three times a day (TID) | ORAL | 0 refills | Status: DC

## 2019-09-19 NOTE — Progress Notes (Signed)
Telephone visit  Subjective: Lori Schroeder:[id number] throat PCP: [name], DO PF:[id number] [name] is a 63 y.o. female calls for telephone consult today. Patient provides verbal consent for consult held via phone.  Patient is identified with 2 separate identifiers.  At this time the entire area is on COVID-19 social distancing and stay home orders are in place.  Patient is of higher risk and therefore we are performing this by a virtual method.  Location of patient: home Location of provider: [facility] Others present for call: no  This patient has sore throat and had less than 2 days with some fatigue. Denies sinus headache and postnasal drainage. . Pain with swallowing, decreased appetite and headache.  No known exposure to strep or to COVID 19  No nausea or vomiting    ROS: Per HPI  No Known Allergies Past Medical History:  Diagnosis Date  . Blood transfusion without reported diagnosis   . External hemorrhoids without mention of complication 123XX123   Colonoscopy  . Family history of malignant neoplasm of gastrointestinal tract   . Family history of malignant neoplasm of gastrointestinal tract   . GERD (gastroesophageal reflux disease)   . Hyperlipemia   . Hypertension     Current Outpatient Medications:  .  albuterol (PROAIR HFA) 108 (90 Base) MCG/ACT inhaler, 2 PUFFS EVERY 6 HOURS AS NEEDED FOR WHEEZING OR SHORTNESS OF BREATH, Disp: 8.5 g, Rfl: 0 .  amoxicillin (AMOXIL) 500 MG capsule, Take 1 capsule (500 mg total) by mouth 3 (three) times daily., Disp: 30 capsule, Rfl: 0 .  atorvastatin (LIPITOR) 10 MG tablet, Take 1 tablet (10 mg total) by mouth daily., Disp: 90 tablet, Rfl: 3 .  diclofenac sodium (VOLTAREN) 1 % GEL, Apply 4 g topically 4 (four) times daily., Disp: 400 g, Rfl: 3 .  Dulaglutide (TRULICITY) 1.5 0000000 SOPN, Inject 1.5 mg into the skin once a week., Disp: 2 mL, Rfl: 12 .  esomeprazole (NEXIUM) 40 MG capsule, TAKE  (1)  CAPSULE  TWICE DAILY., Disp: 180 capsule,  Rfl: 1 .  fluticasone (FLONASE) 50 MCG/ACT nasal spray, Place 2 sprays into both nostrils daily., Disp: 16 g, Rfl: 6 .  lisinopril (ZESTRIL) 5 MG tablet, Take 1 tablet (5 mg total) by mouth daily., Disp: 90 tablet, Rfl: 3 .  naloxone (NARCAN) nasal spray 4 mg/0.1 mL, Place 1 spray into the nose once., Disp: , Rfl:  .  NUCYNTA 75 MG tablet, Take 75 mg by mouth 3 (three) times daily as needed. , Disp: , Rfl: 0 .  pregabalin (LYRICA) 300 MG capsule, Take 300 mg by mouth daily. , Disp: , Rfl:  .  tiZANidine (ZANAFLEX) 4 MG tablet, TAKE ONE TABLET BY MOUTH THREE TIMES DAILY AS NEEDED FOR MUSCLE SPASM, Disp: , Rfl:  .  venlafaxine XR (EFFEXOR-XR) 75 MG 24 hr capsule, venlafaxine ER 75 mg capsule,extended release 24 hr, Disp: , Rfl:   Assessment/ Plan: 63 y.o. female   1. Sore throat Probable strep - amoxicillin (AMOXIL) 500 MG capsule; Take 1 capsule (500 mg total) by mouth 3 (three) times daily.  Dispense: 30 capsule; Refill: 0   No follow-ups on file.  Continue all other maintenance medications as listed above.  Start time: 1:22 PM End time: 1:29 PM  Meds ordered this encounter  Medications  . amoxicillin (AMOXIL) 500 MG capsule    Sig: Take 1 capsule (500 mg total) by mouth 3 (three) times daily.    Dispense:  30 capsule  Refill:  0    Order Specific Question:   Supervising Provider    Answer:   [name] [[id number]]    Particia Nearing PA-C [place] [phone]

## 2019-10-17 ENCOUNTER — Ambulatory Visit (INDEPENDENT_AMBULATORY_CARE_PROVIDER_SITE_OTHER): Payer: Medicare Other | Admitting: Family Medicine

## 2019-10-17 ENCOUNTER — Other Ambulatory Visit: Payer: Self-pay

## 2019-10-17 ENCOUNTER — Encounter: Payer: Self-pay | Admitting: Family Medicine

## 2019-10-17 DIAGNOSIS — J441 Chronic obstructive pulmonary disease with (acute) exacerbation: Secondary | ICD-10-CM | POA: Diagnosis not present

## 2019-10-17 DIAGNOSIS — H9202 Otalgia, left ear: Secondary | ICD-10-CM

## 2019-10-17 DIAGNOSIS — J069 Acute upper respiratory infection, unspecified: Secondary | ICD-10-CM

## 2019-10-17 MED ORDER — ALBUTEROL SULFATE HFA 108 (90 BASE) MCG/ACT IN AERS: INHALATION_SPRAY | RESPIRATORY_TRACT | 0 refills | Status: DC

## 2019-10-17 MED ORDER — PREDNISONE 20 MG PO TABS: ORAL_TABLET | ORAL | 0 refills | Status: DC

## 2019-10-17 MED ORDER — AMOXICILLIN-POT CLAVULANATE 875-125 MG PO TABS: 1.0000 | ORAL_TABLET | Freq: Two times a day (BID) | ORAL | 0 refills | Status: AC

## 2019-10-17 NOTE — Progress Notes (Signed)
Virtual Visit via telephone Note Due to COVID-19 pandemic this visit was conducted virtually. This visit type was conducted due to national recommendations for restrictions regarding the COVID-19 Pandemic (e.g. social distancing, sheltering in place) in an effort to limit this patient's exposure and mitigate transmission in our community. All issues noted in this document were discussed and addressed.  A physical exam was not performed with this format.   I connected with Tanis Brecker on 10/17/2019 at 0910 by telephone and verified that I am speaking with the correct person using two identifiers. Acasia Helm is currently located at home and family is currently with them during visit. The provider, Monia Pouch, FNP is located in their office at time of visit.  I discussed the limitations, risks, security and privacy concerns of performing an evaluation and management service by telephone and the availability of in person appointments. I also discussed with the patient that there may be a patient responsible charge related to this service. The patient expressed understanding and agreed to proceed.  Subjective:  Patient ID: Lori Schroeder, female    DOB: 02-10-1956, 64 y.o.   MRN: [id number]  Chief Complaint:  URI   HPI: Lori Schroeder is a 64 y.o. female presenting on 10/17/2019 for URI   Pt reports left otalgia, cough, chest congestion, and tightness in her chest.  She denies fever or increased sputum production.  She does have exertional shortness of breath.  She has been using her albuterol without complete relief of symptoms.  She denies fever, weakness, or confusion.  No fatigue.  States the chest tightness is with breathing.  No pressure-like pain.  No radiation of pain.  States it just feels a little bit more difficult to breathe.  She denies exposure to anyone sick or recent travel.    Relevant past medical, surgical, family, and social history reviewed and updated as indicated.  Allergies  and medications reviewed and updated.   Past Medical History:  Diagnosis Date  . Blood transfusion without reported diagnosis   . External hemorrhoids without mention of complication 123XX123   Colonoscopy  . Family history of malignant neoplasm of gastrointestinal tract   . Family history of malignant neoplasm of gastrointestinal tract   . GERD (gastroesophageal reflux disease)   . Hyperlipemia   . Hypertension     Past Surgical History:  Procedure Laterality Date  . ABDOMINAL HYSTERECTOMY    . BACK SURGERY    . BLADDER SURGERY     Bladder Tact  . CARPAL TUNNEL RELEASE Right   . COLONOSCOPY    . NECK SURGERY      Social History   Socioeconomic History  . Marital status: Married    Spouse name: Not on file  . Number of children: 2  . Years of education: 21  . Highest education level: Associate degree: occupational, Hotel manager, or vocational program  Occupational History  . Occupation: disability    Employer: DISABLED  Tobacco Use  . Smoking status: Current Every Day Smoker    Packs/day: 0.50    Years: 10.00    Pack years: 5.00    Types: Cigarettes  . Smokeless tobacco: Never Used  Substance and Sexual Activity  . Alcohol use: No  . Drug use: No  . Sexual activity: Not on file  Other Topics Concern  . Not on file  Social History Narrative  . Not on file   Social Determinants of Health   Financial Resource Strain: Low Risk   . Difficulty of  Paying Living Expenses: Not hard at all  Food Insecurity: No Food Insecurity  . Worried About Charity fundraiser in the Last Year: Never true  . Ran Out of Food in the Last Year: Never true  Transportation Needs: No Transportation Needs  . Lack of Transportation (Medical): No  . Lack of Transportation (Non-Medical): No  Physical Activity: Inactive  . Days of Exercise per Week: 0 days  . Minutes of Exercise per Session: 0 min  Stress: No Stress Concern Present  . Feeling of Stress : Not at all  Social Connections:  Slightly Isolated  . Frequency of Communication with Friends and Family: More than three times a week  . Frequency of Social Gatherings with Friends and Family: More than three times a week  . Attends Religious Services: More than 4 times per year  . Active Member of Clubs or Organizations: No  . Attends Archivist Meetings: Never  . Marital Status: Married  Human resources officer Violence: Not At Risk  . Fear of Current or Ex-Partner: No  . Emotionally Abused: No  . Physically Abused: No  . Sexually Abused: No    Outpatient Encounter Medications as of 10/17/2019  Medication Sig  . albuterol (PROAIR HFA) 108 (90 Base) MCG/ACT inhaler 2 PUFFS EVERY 6 HOURS AS NEEDED FOR WHEEZING OR SHORTNESS OF BREATH  . amoxicillin (AMOXIL) 500 MG capsule Take 1 capsule (500 mg total) by mouth 3 (three) times daily.  Marland Kitchen amoxicillin-clavulanate (AUGMENTIN) 875-125 MG tablet Take 1 tablet by mouth 2 (two) times daily for 10 days.  Marland Kitchen atorvastatin (LIPITOR) 10 MG tablet Take 1 tablet (10 mg total) by mouth daily.  . diclofenac sodium (VOLTAREN) 1 % GEL Apply 4 g topically 4 (four) times daily.  . Dulaglutide (TRULICITY) 1.5 0000000 SOPN Inject 1.5 mg into the skin once a week.  . esomeprazole (NEXIUM) 40 MG capsule TAKE  (1)  CAPSULE  TWICE DAILY.  . fluticasone (FLONASE) 50 MCG/ACT nasal spray Place 2 sprays into both nostrils daily.  Marland Kitchen lisinopril (ZESTRIL) 5 MG tablet Take 1 tablet (5 mg total) by mouth daily.  . naloxone (NARCAN) nasal spray 4 mg/0.1 mL Place 1 spray into the nose once.  . NUCYNTA 75 MG tablet Take 75 mg by mouth 3 (three) times daily as needed.   . predniSONE (DELTASONE) 20 MG tablet 2 po at sametime daily for 5 days  . pregabalin (LYRICA) 300 MG capsule Take 300 mg by mouth daily.   Marland Kitchen tiZANidine (ZANAFLEX) 4 MG tablet TAKE ONE TABLET BY MOUTH THREE TIMES DAILY AS NEEDED FOR MUSCLE SPASM  . venlafaxine XR (EFFEXOR-XR) 75 MG 24 hr capsule venlafaxine ER 75 mg capsule,extended release  24 hr  . [DISCONTINUED] albuterol (PROAIR HFA) 108 (90 Base) MCG/ACT inhaler 2 PUFFS EVERY 6 HOURS AS NEEDED FOR WHEEZING OR SHORTNESS OF BREATH   No facility-administered encounter medications on file as of 10/17/2019.    No Known Allergies  Review of Systems  Constitutional: Negative for activity change, appetite change, chills, diaphoresis, fatigue, fever and unexpected weight change.  HENT: Positive for congestion and ear pain. Negative for postnasal drip, rhinorrhea, sinus pressure, sinus pain, sneezing, sore throat and voice change.   Eyes: Negative.  Negative for photophobia and visual disturbance.  Respiratory: Positive for cough, chest tightness, shortness of breath and wheezing.   Cardiovascular: Negative for chest pain, palpitations and leg swelling.  Gastrointestinal: Negative for abdominal pain, blood in stool, constipation, diarrhea, nausea and vomiting.  Endocrine: Negative.  Genitourinary: Negative for decreased urine volume, difficulty urinating, dysuria, frequency and urgency.  Musculoskeletal: Negative for arthralgias and myalgias.  Skin: Negative.   Allergic/Immunologic: Negative.   Neurological: Negative for dizziness, tremors, seizures, syncope, facial asymmetry, speech difficulty, weakness, light-headedness, numbness and headaches.  Hematological: Negative.   Psychiatric/Behavioral: Negative for confusion, hallucinations, sleep disturbance and suicidal ideas.  All other systems reviewed and are negative.        Observations/Objective: No vital signs or physical exam, this was a telephone or virtual health encounter.  Pt alert and oriented, answers all questions appropriately, and able to speak in full sentences.    Assessment and Plan: Sharina was seen today for uri.  Diagnoses and all orders for this visit:  URI with cough and congestion Acute otalgia, left Reported symptoms consistent with upper respiratory infection with left otalgia.  No reported sick  contacts or fever.  Due to underlying COPD along with chest tightness, increased congestion, cough, and exertional shortness of breath will initiate Augmentin as prescribed.  Will give burst of steroids and refill SABA.  Patient aware to report any new, worsening, or persistent symptoms.  Follow-up as needed. -     amoxicillin-clavulanate (AUGMENTIN) 875-125 MG tablet; Take 1 tablet by mouth 2 (two) times daily for 10 days. -     predniSONE (DELTASONE) 20 MG tablet; 2 po at sametime daily for 5 days -     albuterol (PROAIR HFA) 108 (90 Base) MCG/ACT inhaler; 2 PUFFS EVERY 6 HOURS AS NEEDED FOR WHEEZING OR SHORTNESS OF BREATH  COPD exacerbation (HCC) Reports symptoms consistent with COPD exacerbation along with URI and left otalgia.  Details above, will treat as follows. -     amoxicillin-clavulanate (AUGMENTIN) 875-125 MG tablet; Take 1 tablet by mouth 2 (two) times daily for 10 days. -     predniSONE (DELTASONE) 20 MG tablet; 2 po at sametime daily for 5 days -     albuterol (PROAIR HFA) 108 (90 Base) MCG/ACT inhaler; 2 PUFFS EVERY 6 HOURS AS NEEDED FOR WHEEZING OR SHORTNESS OF BREATH   Follow Up Instructions: Return if symptoms worsen or fail to improve.    I discussed the assessment and treatment plan with the patient. The patient was provided an opportunity to ask questions and all were answered. The patient agreed with the plan and demonstrated an understanding of the instructions.   The patient was advised to call back or seek an in-person evaluation if the symptoms worsen or if the condition fails to improve as anticipated.  The above assessment and management plan was discussed with the patient. The patient verbalized understanding of and has agreed to the management plan. Patient is aware to call the clinic if they develop any new symptoms or if symptoms persist or worsen. Patient is aware when to return to the clinic for a follow-up visit. Patient educated on when it is appropriate to  go to the emergency department.    I provided 15 minutes of non-face-to-face time during this encounter. The call started at 0910. The call ended at 0925. The other time was used for coordination of care.    Monia Pouch, FNP-C [facility] [address] [place], [place] [address] [phone] 10/17/2019

## 2019-10-23 DIAGNOSIS — M545 Low back pain: Secondary | ICD-10-CM | POA: Diagnosis not present

## 2019-10-23 DIAGNOSIS — G894 Chronic pain syndrome: Secondary | ICD-10-CM | POA: Diagnosis not present

## 2019-10-23 DIAGNOSIS — M5416 Radiculopathy, lumbar region: Secondary | ICD-10-CM | POA: Diagnosis not present

## 2019-10-23 DIAGNOSIS — M961 Postlaminectomy syndrome, not elsewhere classified: Secondary | ICD-10-CM | POA: Diagnosis not present

## 2019-11-12 ENCOUNTER — Ambulatory Visit (INDEPENDENT_AMBULATORY_CARE_PROVIDER_SITE_OTHER): Payer: Medicare Other | Admitting: Family Medicine

## 2019-11-12 ENCOUNTER — Encounter: Payer: Self-pay | Admitting: Family Medicine

## 2019-11-12 DIAGNOSIS — J329 Chronic sinusitis, unspecified: Secondary | ICD-10-CM | POA: Diagnosis not present

## 2019-11-12 DIAGNOSIS — J40 Bronchitis, not specified as acute or chronic: Secondary | ICD-10-CM

## 2019-11-12 MED ORDER — PREDNISONE 10 MG PO TABS: ORAL_TABLET | ORAL | 0 refills | Status: DC

## 2019-11-12 MED ORDER — AMOXICILLIN-POT CLAVULANATE 875-125 MG PO TABS: 1.0000 | ORAL_TABLET | Freq: Two times a day (BID) | ORAL | 0 refills | Status: DC

## 2019-11-12 NOTE — Progress Notes (Signed)
Subjective:    Patient ID: Lori Schroeder, female    DOB: April 27, 1956, 64 y.o.   MRN: [id number]   HPI: Lori Schroeder is a 64 y.o. female presenting for Ears and throat hurt. Tight in chest in spite of using albuterol. Left ear hurts. Some PND. Throat is sore. Onset was 2 days ago. Worse last night. Denies fever. Feels hot, but thermometer says normal. Had some chills. Doesn't leave her house. Self isolating throughout the pandemic.This is more like what she gets every year. Pt. Is a diabetic. Glucose running 106 in AMs. 130-140 two hours after a meal.    Depression screen [facility] 2/9 07/17/2019 03/19/2019 02/14/2019 10/31/2018 09/09/2018  Decreased Interest 0 0 0 0 0  Down, Depressed, Hopeless 0 0 0 0 0  PHQ - 2 Score 0 0 0 0 0  Altered sleeping 0 - 0 - 0  Tired, decreased energy 0 - 0 - 0  Change in appetite 0 - 0 - 0  Feeling bad or failure about yourself  0 - 0 - 0  Trouble concentrating 0 - 0 - 0  Moving slowly or fidgety/restless 0 - 0 - 0  Suicidal thoughts 0 - 0 - 0  PHQ-9 Score 0 - 0 - 0  Difficult doing work/chores - - - - Not difficult at all     Relevant past medical, surgical, family and social history reviewed and updated as indicated.  Interim medical history since our last visit reviewed. Allergies and medications reviewed and updated.  ROS:  Review of Systems  Constitutional: Negative for activity change, appetite change, chills and fever.  HENT: Positive for congestion, postnasal drip, rhinorrhea and sinus pressure. Negative for ear discharge, ear pain, hearing loss, nosebleeds, sneezing and trouble swallowing.   Respiratory: Negative for chest tightness and shortness of breath.   Cardiovascular: Negative for chest pain and palpitations.  Skin: Negative for rash.     Social History   Tobacco Use  Smoking Status Current Every Day Smoker  . Packs/day: 0.50  . Years: 10.00  . Pack years: 5.00  . Types: Cigarettes  Smokeless Tobacco Never Used       Objective:     Wt Readings from Last 3 Encounters:  07/17/19 122 lb (55.3 kg)  02/14/19 123 lb (55.8 kg)  10/31/18 110 lb (49.9 kg)     Exam deferred. Pt. Harboring due to COVID 19. Phone visit performed.   Assessment & Plan:  No diagnosis found.  Meds ordered this encounter  Medications  . amoxicillin-clavulanate (AUGMENTIN) 875-125 MG tablet    Sig: Take 1 tablet by mouth 2 (two) times daily. Take all of this medication    Dispense:  20 tablet    Refill:  0  . predniSONE (DELTASONE) 10 MG tablet    Sig: Take 5 daily for 2 days followed by 4,3,2 and 1 for 2 days each.    Dispense:  30 tablet    Refill:  0    No orders of the defined types were placed in this encounter.     There are no diagnoses linked to this encounter.  Virtual Visit via telephone Note  I discussed the limitations, risks, security and privacy concerns of performing an evaluation and management service by telephone and the availability of in person appointments. The patient was identified with two identifiers. Pt.expressed understanding and agreed to proceed. Pt. Is at home. Dr. Livia Snellen is in his office.  Follow Up Instructions:   I discussed the assessment  and treatment plan with the patient. The patient was provided an opportunity to ask questions and all were answered. The patient agreed with the plan and demonstrated an understanding of the instructions.   The patient was advised to call back or seek an in-person evaluation if the symptoms worsen or if the condition fails to improve as anticipated.   Total minutes including chart review and phone contact time: 8   Follow up plan: No follow-ups on file.  Claretta Fraise, MD [place]

## 2019-12-16 ENCOUNTER — Ambulatory Visit (INDEPENDENT_AMBULATORY_CARE_PROVIDER_SITE_OTHER): Payer: Medicare Other | Admitting: Family Medicine

## 2019-12-16 DIAGNOSIS — M542 Cervicalgia: Secondary | ICD-10-CM

## 2019-12-16 DIAGNOSIS — Z560 Unemployment, unspecified: Secondary | ICD-10-CM | POA: Diagnosis not present

## 2019-12-16 DIAGNOSIS — M545 Low back pain, unspecified: Secondary | ICD-10-CM

## 2019-12-16 DIAGNOSIS — G8929 Other chronic pain: Secondary | ICD-10-CM

## 2019-12-16 NOTE — Progress Notes (Signed)
Telephone visit  Subjective: CC: jury duty PCP: [name], DO [id number] Lori Schroeder is a 64 y.o. female calls for telephone consult today. Patient provides verbal consent for consult held via phone.  Due to COVID-19 pandemic this visit was conducted virtually. This visit type was conducted due to national recommendations for restrictions regarding the COVID-19 Pandemic (e.g. social distancing, sheltering in place) in an effort to limit this patient's exposure and mitigate transmission in our community. All issues noted in this document were discussed and addressed.  A physical exam was not performed with this format.   Location of patient: home Location of provider: Working remotely from home Others present for call: none  1. Disabled Patient notes difficulty sitting/ standing for a long length of time.  She is legally disabled.  She worries that she will require pain medication in efforts to sit or stand for jury duty.  She notes that this will likely lead to her needing to take her opioid pain medications which often impair her judgment.  She was summoned for jury duty on 12/23/2019.  She is asking for an excuse from jury duty.  SurgHx: Anterior cervical discectomy with fusion at C4-C7, multiple lumbar injections/nerve blocks   ROS: Per HPI  No Known Allergies Past Medical History:  Diagnosis Date  . Blood transfusion without reported diagnosis   . External hemorrhoids without mention of complication 123XX123   Colonoscopy  . Family history of malignant neoplasm of gastrointestinal tract   . Family history of malignant neoplasm of gastrointestinal tract   . GERD (gastroesophageal reflux disease)   . Hyperlipemia   . Hypertension     Current Outpatient Medications:  .  albuterol (PROAIR HFA) 108 (90 Base) MCG/ACT inhaler, 2 PUFFS EVERY 6 HOURS AS NEEDED FOR WHEEZING OR SHORTNESS OF BREATH, Disp: 8.5 g, Rfl: 0 .  amoxicillin (AMOXIL) 500 MG capsule, Take 1 capsule (500 mg  total) by mouth 3 (three) times daily., Disp: 30 capsule, Rfl: 0 .  amoxicillin-clavulanate (AUGMENTIN) 875-125 MG tablet, Take 1 tablet by mouth 2 (two) times daily. Take all of this medication, Disp: 20 tablet, Rfl: 0 .  atorvastatin (LIPITOR) 10 MG tablet, Take 1 tablet (10 mg total) by mouth daily., Disp: 90 tablet, Rfl: 3 .  diclofenac sodium (VOLTAREN) 1 % GEL, Apply 4 g topically 4 (four) times daily., Disp: 400 g, Rfl: 3 .  Dulaglutide (TRULICITY) 1.5 0000000 SOPN, Inject 1.5 mg into the skin once a week., Disp: 2 mL, Rfl: 12 .  esomeprazole (NEXIUM) 40 MG capsule, TAKE  (1)  CAPSULE  TWICE DAILY., Disp: 180 capsule, Rfl: 1 .  fluticasone (FLONASE) 50 MCG/ACT nasal spray, Place 2 sprays into both nostrils daily., Disp: 16 g, Rfl: 6 .  lisinopril (ZESTRIL) 5 MG tablet, Take 1 tablet (5 mg total) by mouth daily., Disp: 90 tablet, Rfl: 3 .  naloxone (NARCAN) nasal spray 4 mg/0.1 mL, Place 1 spray into the nose once., Disp: , Rfl:  .  NUCYNTA 75 MG tablet, Take 75 mg by mouth 3 (three) times daily as needed. , Disp: , Rfl: 0 .  predniSONE (DELTASONE) 10 MG tablet, Take 5 daily for 2 days followed by 4,3,2 and 1 for 2 days each., Disp: 30 tablet, Rfl: 0 .  pregabalin (LYRICA) 300 MG capsule, Take 300 mg by mouth daily. , Disp: , Rfl:  .  tiZANidine (ZANAFLEX) 4 MG tablet, TAKE ONE TABLET BY MOUTH THREE TIMES DAILY AS NEEDED FOR MUSCLE SPASM, Disp: , Rfl:  .  venlafaxine XR (EFFEXOR-XR) 75 MG 24 hr capsule, venlafaxine ER 75 mg capsule,extended release 24 hr, Disp: , Rfl:   Assessment/ Plan: 64 y.o. female   1. Not currently working due to disabled status I reviewed her last office visit with her pain doctor.  There is clear statement the patient develops dizziness and drowsiness after her pain medication.  She uses these pain medications regularly to control her neck and back pain.  I agree that she would likely not do well waiting for prolonged amount of time to be selected for jury duty nor  would she be able to withstand the sitting for a long time as a juror without requiring her pain medication, which I do agree would impair her judgment.  I have read the note and printed this before faxing.  Patient will come in this morning with a fax information and additional paperwork and we will have this sent today for her.  Hard copy will be given to patient.  She will follow-up next month as scheduled for routine medical care  2. Chronic bilateral low back pain, unspecified whether sciatica present  3. Chronic neck pain   Start time: 8:02am End time: 8:08am  Total time spent on patient care (including telephone call/ virtual visit): 16 minutes  [place], [place] [phone]

## 2019-12-29 ENCOUNTER — Ambulatory Visit (INDEPENDENT_AMBULATORY_CARE_PROVIDER_SITE_OTHER): Payer: Medicare Other | Admitting: Family Medicine

## 2019-12-29 ENCOUNTER — Encounter: Payer: Self-pay | Admitting: Family Medicine

## 2019-12-29 DIAGNOSIS — J069 Acute upper respiratory infection, unspecified: Secondary | ICD-10-CM

## 2019-12-29 DIAGNOSIS — R05 Cough: Secondary | ICD-10-CM | POA: Diagnosis not present

## 2019-12-29 DIAGNOSIS — R0981 Nasal congestion: Secondary | ICD-10-CM

## 2019-12-29 MED ORDER — MUCINEX 600 MG PO TB12: 600.0000 mg | ORAL_TABLET | Freq: Two times a day (BID) | ORAL | 0 refills | Status: AC

## 2019-12-29 MED ORDER — PREDNISONE 20 MG PO TABS: ORAL_TABLET | ORAL | 0 refills | Status: DC

## 2019-12-29 NOTE — Progress Notes (Signed)
Virtual Visit via telephone Note Due to COVID-19 pandemic this visit was conducted virtually. This visit type was conducted due to national recommendations for restrictions regarding the COVID-19 Pandemic (e.g. social distancing, sheltering in place) in an effort to limit this patient's exposure and mitigate transmission in our community. All issues noted in this document were discussed and addressed.  A physical exam was not performed with this format.   I connected with Lori Schroeder on 12/29/2019 at 1315 by telephone and verified that I am speaking with the correct person using two identifiers. Lori Schroeder is currently located at home and family is currently with them during visit. The provider, Monia Pouch, FNP is located in their office at time of visit.  I discussed the limitations, risks, security and privacy concerns of performing an evaluation and management service by telephone and the availability of in person appointments. I also discussed with the patient that there may be a patient responsible charge related to this service. The patient expressed understanding and agreed to proceed.  Subjective:  Patient ID: Lori Schroeder, female    DOB: 06-07-1956, 64 y.o.   MRN: [id number]  Chief Complaint:  Cough   HPI: Lori Schroeder is a 64 y.o. female presenting on 12/29/2019 for Cough   Cough with production of thick white sputum. No fever, chills, chest pain, or shortness of breath. States she does have intermittent wheezing with the cough.   Cough This is a recurrent problem. The current episode started in the past 7 days. The problem has been waxing and waning. The problem occurs every few minutes. The cough is productive of sputum. Associated symptoms include postnasal drip and wheezing. Pertinent negatives include no chest pain, chills, ear congestion, ear pain, fever, headaches, heartburn, hemoptysis, myalgias, nasal congestion, rash, rhinorrhea, sore throat, shortness of breath, sweats or  weight loss. Nothing aggravates the symptoms. She has tried a beta-agonist inhaler for the symptoms. The treatment provided moderate relief. There is no history of asthma or COPD.     Relevant past medical, surgical, family, and social history reviewed and updated as indicated.  Allergies and medications reviewed and updated.   Past Medical History:  Diagnosis Date  . Blood transfusion without reported diagnosis   . External hemorrhoids without mention of complication [id number]   Colonoscopy  . Family history of malignant neoplasm of gastrointestinal tract   . Family history of malignant neoplasm of gastrointestinal tract   . GERD (gastroesophageal reflux disease)   . Hyperlipemia   . Hypertension     Past Surgical History:  Procedure Laterality Date  . ABDOMINAL HYSTERECTOMY    . BACK SURGERY    . BLADDER SURGERY     Bladder Tact  . CARPAL TUNNEL RELEASE Right   . COLONOSCOPY    . NECK SURGERY      Social History   Socioeconomic History  . Marital status: Married    Spouse name: Not on file  . Number of children: 2  . Years of education: 71  . Highest education level: Associate degree: occupational, Hotel manager, or vocational program  Occupational History  . Occupation: disability    Employer: DISABLED  Tobacco Use  . Smoking status: Current Every Day Smoker    Packs/day: 0.50    Years: 10.00    Pack years: 5.00    Types: Cigarettes  . Smokeless tobacco: Never Used  Substance and Sexual Activity  . Alcohol use: No  . Drug use: No  . Sexual activity: Not on file  Other Topics Concern  . Not on file  Social History Narrative  . Not on file   Social Determinants of Health   Financial Resource Strain: Low Risk   . Difficulty of Paying Living Expenses: Not hard at all  Food Insecurity: No Food Insecurity  . Worried About Charity fundraiser in the Last Year: Never true  . Ran Out of Food in the Last Year: Never true  Transportation Needs: No Transportation  Needs  . Lack of Transportation (Medical): No  . Lack of Transportation (Non-Medical): No  Physical Activity: Inactive  . Days of Exercise per Week: 0 days  . Minutes of Exercise per Session: 0 min  Stress: No Stress Concern Present  . Feeling of Stress : Not at all  Social Connections: Slightly Isolated  . Frequency of Communication with Friends and Family: More than three times a week  . Frequency of Social Gatherings with Friends and Family: More than three times a week  . Attends Religious Services: More than 4 times per year  . Active Member of Clubs or Organizations: No  . Attends Archivist Meetings: Never  . Marital Status: Married  Human resources officer Violence: Not At Risk  . Fear of Current or Ex-Partner: No  . Emotionally Abused: No  . Physically Abused: No  . Sexually Abused: No    Outpatient Encounter Medications as of 12/29/2019  Medication Sig  . albuterol (PROAIR HFA) 108 (90 Base) MCG/ACT inhaler 2 PUFFS EVERY 6 HOURS AS NEEDED FOR WHEEZING OR SHORTNESS OF BREATH  . amoxicillin (AMOXIL) 500 MG capsule Take 1 capsule (500 mg total) by mouth 3 (three) times daily.  Marland Kitchen amoxicillin-clavulanate (AUGMENTIN) 875-125 MG tablet Take 1 tablet by mouth 2 (two) times daily. Take all of this medication  . atorvastatin (LIPITOR) 10 MG tablet Take 1 tablet (10 mg total) by mouth daily.  . diclofenac sodium (VOLTAREN) 1 % GEL Apply 4 g topically 4 (four) times daily.  . Dulaglutide (TRULICITY) 1.5 0000000 SOPN Inject 1.5 mg into the skin once a week.  . esomeprazole (NEXIUM) 40 MG capsule TAKE  (1)  CAPSULE  TWICE DAILY.  . fluticasone (FLONASE) 50 MCG/ACT nasal spray Place 2 sprays into both nostrils daily.  Marland Kitchen guaiFENesin (MUCINEX) 600 MG 12 hr tablet Take 1 tablet (600 mg total) by mouth 2 (two) times daily.  Marland Kitchen lisinopril (ZESTRIL) 5 MG tablet Take 1 tablet (5 mg total) by mouth daily.  . naloxone (NARCAN) nasal spray 4 mg/0.1 mL Place 1 spray into the nose once.  .  NUCYNTA 75 MG tablet Take 75 mg by mouth 3 (three) times daily as needed.   . predniSONE (DELTASONE) 20 MG tablet 2 po at sametime daily for 5 days  . pregabalin (LYRICA) 300 MG capsule Take 300 mg by mouth daily.   Marland Kitchen tiZANidine (ZANAFLEX) 4 MG tablet TAKE ONE TABLET BY MOUTH THREE TIMES DAILY AS NEEDED FOR MUSCLE SPASM  . venlafaxine XR (EFFEXOR-XR) 75 MG 24 hr capsule venlafaxine ER 75 mg capsule,extended release 24 hr  . [DISCONTINUED] predniSONE (DELTASONE) 10 MG tablet Take 5 daily for 2 days followed by 4,3,2 and 1 for 2 days each.   No facility-administered encounter medications on file as of 12/29/2019.    No Known Allergies  Review of Systems  Constitutional: Negative for activity change, appetite change, chills, diaphoresis, fatigue, fever, unexpected weight change and weight loss.  HENT: Positive for congestion and postnasal drip. Negative for ear pain, rhinorrhea, sinus pressure,  sinus pain and sore throat.   Eyes: Negative.  Negative for photophobia and visual disturbance.  Respiratory: Positive for cough and wheezing. Negative for apnea, hemoptysis, choking, chest tightness, shortness of breath and stridor.   Cardiovascular: Negative for chest pain, palpitations and leg swelling.  Gastrointestinal: Negative for blood in stool, constipation, diarrhea, heartburn, nausea and vomiting.  Endocrine: Negative.   Genitourinary: Negative for decreased urine volume, difficulty urinating, dysuria, frequency and urgency.  Musculoskeletal: Negative for arthralgias and myalgias.  Skin: Negative.  Negative for rash.  Allergic/Immunologic: Negative.   Neurological: Negative for dizziness, weakness and headaches.  Hematological: Negative.   Psychiatric/Behavioral: Negative for confusion, hallucinations, sleep disturbance and suicidal ideas.  All other systems reviewed and are negative.        Observations/Objective: No vital signs or physical exam, this was a telephone or virtual health  encounter.  Pt alert and oriented, answers all questions appropriately, and able to speak in full sentences.    Assessment and Plan: Sariya was seen today for cough.  Diagnoses and all orders for this visit:  URI with cough and congestion No red flags present concerning for acute bacterial infection or CAP. Will burst with steroids. Mucinex twice daily with plenty if water. Continue Albuterol as needed for cough. Report any new, worsening, or persistent symptoms.  -     guaiFENesin (MUCINEX) 600 MG 12 hr tablet; Take 1 tablet (600 mg total) by mouth 2 (two) times daily. -     predniSONE (DELTASONE) 20 MG tablet; 2 po at sametime daily for 5 days     Follow Up Instructions: Return if symptoms worsen or fail to improve.    I discussed the assessment and treatment plan with the patient. The patient was provided an opportunity to ask questions and all were answered. The patient agreed with the plan and demonstrated an understanding of the instructions.   The patient was advised to call back or seek an in-person evaluation if the symptoms worsen or if the condition fails to improve as anticipated.  The above assessment and management plan was discussed with the patient. The patient verbalized understanding of and has agreed to the management plan. Patient is aware to call the clinic if they develop any new symptoms or if symptoms persist or worsen. Patient is aware when to return to the clinic for a follow-up visit. Patient educated on when it is appropriate to go to the emergency department.    I provided 15 minutes of non-face-to-face time during this encounter. The call started at 1315. The call ended at 1330. The other time was used for coordination of care.    Monia Pouch, FNP-C [facility] [address] [place], [place] [address] [phone] 12/29/2019

## 2020-01-07 DIAGNOSIS — G894 Chronic pain syndrome: Secondary | ICD-10-CM | POA: Diagnosis not present

## 2020-01-07 DIAGNOSIS — M5416 Radiculopathy, lumbar region: Secondary | ICD-10-CM | POA: Diagnosis not present

## 2020-01-07 DIAGNOSIS — M545 Low back pain: Secondary | ICD-10-CM | POA: Diagnosis not present

## 2020-01-07 DIAGNOSIS — M961 Postlaminectomy syndrome, not elsewhere classified: Secondary | ICD-10-CM | POA: Diagnosis not present

## 2020-01-16 ENCOUNTER — Ambulatory Visit: Payer: Medicare Other | Admitting: Family Medicine

## 2020-02-03 ENCOUNTER — Encounter: Payer: Self-pay | Admitting: Family Medicine

## 2020-02-03 ENCOUNTER — Ambulatory Visit (INDEPENDENT_AMBULATORY_CARE_PROVIDER_SITE_OTHER): Payer: Medicare Other | Admitting: Family Medicine

## 2020-02-03 ENCOUNTER — Other Ambulatory Visit: Payer: Self-pay

## 2020-02-03 VITALS — BP 136/75 | HR 68 | Temp 98.1°F | Ht 59.0 in | Wt 123.0 lb

## 2020-02-03 DIAGNOSIS — E1159 Type 2 diabetes mellitus with other circulatory complications: Secondary | ICD-10-CM

## 2020-02-03 DIAGNOSIS — E1165 Type 2 diabetes mellitus with hyperglycemia: Secondary | ICD-10-CM

## 2020-02-03 DIAGNOSIS — E1169 Type 2 diabetes mellitus with other specified complication: Secondary | ICD-10-CM | POA: Diagnosis not present

## 2020-02-03 DIAGNOSIS — R062 Wheezing: Secondary | ICD-10-CM | POA: Diagnosis not present

## 2020-02-03 DIAGNOSIS — F17200 Nicotine dependence, unspecified, uncomplicated: Secondary | ICD-10-CM | POA: Diagnosis not present

## 2020-02-03 DIAGNOSIS — E785 Hyperlipidemia, unspecified: Secondary | ICD-10-CM

## 2020-02-03 DIAGNOSIS — I10 Essential (primary) hypertension: Secondary | ICD-10-CM | POA: Diagnosis not present

## 2020-02-03 DIAGNOSIS — I152 Hypertension secondary to endocrine disorders: Secondary | ICD-10-CM

## 2020-02-03 LAB — BAYER DCA HB A1C WAIVED: HB A1C (BAYER DCA - WAIVED): 6.7 % (ref <7.0)

## 2020-02-03 MED ORDER — ANORO ELLIPTA 62.5-25 MCG/INH IN AEPB: 1.0000 | INHALATION_SPRAY | Freq: Every day | RESPIRATORY_TRACT | 12 refills | Status: DC

## 2020-02-03 MED ORDER — ANORO ELLIPTA 62.5-25 MCG/INH IN AEPB: 1.0000 | INHALATION_SPRAY | Freq: Every day | RESPIRATORY_TRACT | 0 refills | Status: DC

## 2020-02-03 NOTE — Progress Notes (Signed)
Subjective: CC: HTN, HLD, DM2 f/u PCP: [name], DO EYE:[facility] Lori Schroeder is a 64 y.o. female presenting to clinic today for:  1. Type 2 Diabetes with hypertension and hyperlipidemia; tobacco use disorder:  Patient is doing well.  She admits to eating quite a bit of sugar and worries that her sugar will be up today.  BGs have been running 100-102 fasting and 130-140 2h post prandial. She is compliant with the Trulicity every week, her lisinopril 5 mg and her Lipitor 10 mg daily  Last eye exam: UTD Last foot exam: UTD Last A1c:  Lab Results  Component Value Date   HGBA1C 6.2 07/17/2019   Nephropathy screen indicated?: on ACE-I Last flu, zoster and/or pneumovax:  Immunization History  Administered Date(s) Administered  . Influenza-Unspecified 10/02/2018  . Pneumococcal Conjugate-13 05/29/2018  . Tdap 05/29/2018   ROS: Denies dizziness, foot ulcerations, numbness or tingling in the lower extremities outside of normal.  She denies CP but does admit to smoker's cough and some wheezing that is relieved by albuterol, which she uses daily.  No hemoptysis, unplanned weight loss.  ROS: Per HPI  No Known Allergies Past Medical History:  Diagnosis Date  . Blood transfusion without reported diagnosis   . External hemorrhoids without mention of complication [id number]   Colonoscopy  . Family history of malignant neoplasm of gastrointestinal tract   . Family history of malignant neoplasm of gastrointestinal tract   . GERD (gastroesophageal reflux disease)   . Hyperlipemia   . Hypertension     Current Outpatient Medications:  .  albuterol (PROAIR HFA) 108 (90 Base) MCG/ACT inhaler, 2 PUFFS EVERY 6 HOURS AS NEEDED FOR WHEEZING OR SHORTNESS OF BREATH, Disp: 8.5 g, Rfl: 0 .  atorvastatin (LIPITOR) 10 MG tablet, Take 1 tablet (10 mg total) by mouth daily., Disp: 90 tablet, Rfl: 3 .  diclofenac sodium (VOLTAREN) 1 % GEL, Apply 4 g topically 4 (four) times daily., Disp: 400 g, Rfl: 3 .   Dulaglutide (TRULICITY) 1.5 PY/0.5RT SOPN, Inject 1.5 mg into the skin once a week., Disp: 2 mL, Rfl: 12 .  esomeprazole (NEXIUM) 40 MG capsule, TAKE  (1)  CAPSULE  TWICE DAILY., Disp: 180 capsule, Rfl: 1 .  fluticasone (FLONASE) 50 MCG/ACT nasal spray, Place 2 sprays into both nostrils daily., Disp: 16 g, Rfl: 6 .  lisinopril (ZESTRIL) 5 MG tablet, Take 1 tablet (5 mg total) by mouth daily., Disp: 90 tablet, Rfl: 3 .  naloxone (NARCAN) nasal spray 4 mg/0.1 mL, Place 1 spray into the nose once., Disp: , Rfl:  .  NUCYNTA 75 MG tablet, Take 75 mg by mouth 3 (three) times daily as needed. , Disp: , Rfl: 0 .  pregabalin (LYRICA) 300 MG capsule, Take 300 mg by mouth daily. , Disp: , Rfl:  .  tiZANidine (ZANAFLEX) 4 MG tablet, TAKE ONE TABLET BY MOUTH THREE TIMES DAILY AS NEEDED FOR MUSCLE SPASM, Disp: , Rfl:  .  venlafaxine XR (EFFEXOR-XR) 75 MG 24 hr capsule, venlafaxine ER 75 mg capsule,extended release 24 hr, Disp: , Rfl:  Social History   Socioeconomic History  . Marital status: Married    Spouse name: Not on file  . Number of children: 2  . Years of education: 41  . Highest education level: Associate degree: occupational, Hotel manager, or vocational program  Occupational History  . Occupation: disability    Employer: DISABLED  Tobacco Use  . Smoking status: Current Every Day Smoker    Packs/day: 0.50  Years: 10.00    Pack years: 5.00    Types: Cigarettes  . Smokeless tobacco: Never Used  Substance and Sexual Activity  . Alcohol use: No  . Drug use: No  . Sexual activity: Not on file  Other Topics Concern  . Not on file  Social History Narrative  . Not on file   Social Determinants of Health   Financial Resource Strain: Low Risk   . Difficulty of Paying Living Expenses: Not hard at all  Food Insecurity: No Food Insecurity  . Worried About Charity fundraiser in the Last Year: Never true  . Ran Out of Food in the Last Year: Never true  Transportation Needs: No Transportation  Needs  . Lack of Transportation (Medical): No  . Lack of Transportation (Non-Medical): No  Physical Activity: Inactive  . Days of Exercise per Week: 0 days  . Minutes of Exercise per Session: 0 min  Stress: No Stress Concern Present  . Feeling of Stress : Not at all  Social Connections: Slightly Isolated  . Frequency of Communication with Friends and Family: More than three times a week  . Frequency of Social Gatherings with Friends and Family: More than three times a week  . Attends Religious Services: More than 4 times per year  . Active Member of Clubs or Organizations: No  . Attends Archivist Meetings: Never  . Marital Status: Married  Human resources officer Violence: Not At Risk  . Fear of Current or Ex-Partner: No  . Emotionally Abused: No  . Physically Abused: No  . Sexually Abused: No   Family History  Problem Relation Age of Onset  . Colon cancer Maternal Grandmother   . Alzheimer's disease Mother   . Hyperlipidemia Mother   . Hypertension Mother   . Diabetes Daughter        Type 1  . Breast cancer Neg Hx   . Celiac disease Neg Hx   . Cirrhosis Neg Hx   . Clotting disorder Neg Hx   . Colitis Neg Hx   . Colon polyps Neg Hx   . Crohn's disease Neg Hx   . Cystic fibrosis Neg Hx   . Esophageal cancer Neg Hx   . Heart disease Neg Hx   . Hemochromatosis Neg Hx   . Inflammatory bowel disease Neg Hx   . Irritable bowel syndrome Neg Hx   . Kidney disease Neg Hx   . Liver cancer Neg Hx   . Liver disease Neg Hx   . Ovarian cancer Neg Hx   . Pancreatic cancer Neg Hx   . Prostate cancer Neg Hx   . Rectal cancer Neg Hx   . Stomach cancer Neg Hx   . Ulcerative colitis Neg Hx   . Uterine cancer Neg Hx   . Wilson's disease Neg Hx     Objective: Office vital signs reviewed. BP 136/75   Pulse 68   Temp 98.1 F (36.7 C) (Temporal)   Ht '4\' 11"'$  (1.499 m)   Wt 123 lb (55.8 kg)   SpO2 98%   BMI 24.84 kg/m   Physical Examination:  General: Awake, alert, well  nourished, No acute distress HEENT: Normal. MMM Cardio: regular rate and rhythm, S1S2 heard, no murmurs appreciated Pulm: clear to auscultation bilaterally, global expiratory wheezes. No rhonchi or rales; normal work of breathing on room air Extremities: warm, well perfused, No edema, cyanosis or clubbing; +2 pulses bilaterally MSK: normal gait and station   Assessment/ Plan: 64 y.o.  female   1. Type 2 diabetes mellitus with hyperglycemia, without long-term current use of insulin (HCC) Controlled with slight rise in A1c.  Continue current regimen - CMP14+EGFR - Bayer DCA Hb A1c Waived  2. Hyperlipidemia associated with type 2 diabetes mellitus (HCC) Check lipid - CMP14+EGFR - TSH - Lipid Panel  3. Hypertension associated with diabetes ([place]) controlled - CMP14+EGFR  4. Tobacco use disorder Trial of Anoro. Sample given and rx sent to pharmacy.  She will call and ask to speak with Almyra Free if meds are unaffordable. - umeclidinium-vilanterol (ANORO ELLIPTA) 62.5-25 MCG/INH AEPB; Inhale 1 puff into the lungs daily.  Dispense: 60 each; Refill: 12  5. Expiratory wheezing Prn albuterol. Consider pulm evaluation for PFTs, COPD classification but clinical presentation consistent with COPD. - umeclidinium-vilanterol (ANORO ELLIPTA) 62.5-25 MCG/INH AEPB; Inhale 1 puff into the lungs daily.  Dispense: 60 each; Refill: 12   No orders of the defined types were placed in this encounter.  No orders of the defined types were placed in this encounter.   [name], DO [place] [phone]

## 2020-02-04 DIAGNOSIS — Z5181 Encounter for therapeutic drug level monitoring: Secondary | ICD-10-CM | POA: Diagnosis not present

## 2020-02-04 DIAGNOSIS — M961 Postlaminectomy syndrome, not elsewhere classified: Secondary | ICD-10-CM | POA: Diagnosis not present

## 2020-02-04 DIAGNOSIS — M47816 Spondylosis without myelopathy or radiculopathy, lumbar region: Secondary | ICD-10-CM | POA: Diagnosis not present

## 2020-02-04 DIAGNOSIS — G894 Chronic pain syndrome: Secondary | ICD-10-CM | POA: Diagnosis not present

## 2020-02-04 DIAGNOSIS — Z79899 Other long term (current) drug therapy: Secondary | ICD-10-CM | POA: Diagnosis not present

## 2020-02-04 LAB — LIPID PANEL
Chol/HDL Ratio: 2.9 ratio (ref 0.0–4.4)
Cholesterol, Total: 155 mg/dL (ref 100–199)
HDL: 54 mg/dL (ref >39)
LDL Chol Calc (NIH): 79 mg/dL (ref 0–99)
Triglycerides: 126 mg/dL (ref 0–149)
VLDL Cholesterol Cal: 22 mg/dL (ref 5–40)

## 2020-02-04 LAB — CMP14+EGFR
ALT: 9 IU/L (ref 0–32)
AST: 13 IU/L (ref 0–40)
Albumin/Globulin Ratio: 1.9 (ref 1.2–2.2)
Albumin: 4.3 g/dL (ref 3.8–4.8)
Alkaline Phosphatase: 117 IU/L (ref 39–117)
BUN/Creatinine Ratio: 7 — ABNORMAL LOW (ref 12–28)
BUN: 5 mg/dL — ABNORMAL LOW (ref 8–27)
Bilirubin Total: <0.2 mg/dL (ref 0.0–1.2)
CO2: 22 mmol/L (ref 20–29)
Calcium: 9.8 mg/dL (ref 8.7–10.3)
Chloride: 104 mmol/L (ref 96–106)
Creatinine, Ser: 0.68 mg/dL (ref 0.57–1.00)
GFR calc Af Amer: 108 mL/min/{1.73_m2} (ref >59)
GFR calc non Af Amer: 94 mL/min/{1.73_m2} (ref >59)
Globulin, Total: 2.3 g/dL (ref 1.5–4.5)
Glucose: 88 mg/dL (ref 65–99)
Potassium: 4.3 mmol/L (ref 3.5–5.2)
Sodium: 140 mmol/L (ref 134–144)
Total Protein: 6.6 g/dL (ref 6.0–8.5)

## 2020-02-04 LAB — TSH: TSH: 1.43 u[IU]/mL (ref 0.450–4.500)

## 2020-02-05 ENCOUNTER — Telehealth: Payer: Self-pay | Admitting: Family Medicine

## 2020-02-05 NOTE — Telephone Encounter (Signed)
Hi Almyra Free this is a patient of Dr. Darnell Level and I am covering today but I sent this to me, looks like patient cannot afford her inhalers and all of them around the same price so I was going to see if maybe you could help with this. Caryl Pina, MD [facility] 02/05/2020, 1:52 PM

## 2020-02-11 NOTE — Telephone Encounter (Signed)
Left message for patient to call back regarding inhaler Will assist patient when call returned

## 2020-02-11 NOTE — Telephone Encounter (Signed)
Patient has been taking Anoro Ellipta daily for 7 days per PCP.  She does not notice any improvement or feel any better. 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  She does not exercise due to disability. Will aim to complete PFTs when able (on hold d/t COVID). She is smoking about 11 cigarettes daily (quit for 22 years then started back).  Encouraged patient to quit as she has done this in the past.  Patient is currently in the coverage gap-->Inhaler $120/month The only affordable option is to call in Advair HFA generic 113/14 vs 232/24mcg BID (INDICATION: ASTHMA NOT COPD)  Will give triple therapy (trelegy) sample to see if this will improve her QOL and breathing.  Discussed with Dr. Jean Rosenthal 100MCG Sample LOT#[id number] EXP7/22 #14 PUFFS

## 2020-02-11 NOTE — Telephone Encounter (Signed)
Okay sounds good, go ahead and try the trilogy sample and the Advair as a prescription for the future.  Follow-up with PCP

## 2020-02-12 MED ORDER — FLUTICASONE-SALMETEROL 230-21 MCG/ACT IN AERO: 2.0000 | INHALATION_SPRAY | Freq: Two times a day (BID) | RESPIRATORY_TRACT | 3 refills | Status: DC

## 2020-02-12 NOTE — Addendum Note (Signed)
Addended by: Lottie Dawson D on: 02/12/2020 01:55 PM   Modules accepted: Orders

## 2020-02-16 ENCOUNTER — Telehealth: Payer: Self-pay | Admitting: Family Medicine

## 2020-02-16 NOTE — Telephone Encounter (Signed)
   Patient unable to afford inhaler at Price $140  Patient states she feels better on Anoro Ellipta daily  Patient is over the income for patient assistance programs  Attempted to use [facility] in the place of Medicare D, however pharmacy states they cannot do this  Encouraged smoking cessation  Can attempt to apply for [employer] inhalers and try for appeal, however it is unlikely that patient will qualify  Will continue to explore options

## 2020-02-19 ENCOUNTER — Telehealth: Payer: Self-pay | Admitting: Family Medicine

## 2020-02-19 NOTE — Telephone Encounter (Signed)
Patient took last injection this morning so she is good till next week. She is aware that you are out of the office until tomorrow and would like for you to review this then.

## 2020-02-20 NOTE — Telephone Encounter (Signed)
VM left encouraging call back.  Will attempt to apply for patient assistance through the [employer]

## 2020-02-20 NOTE — Telephone Encounter (Signed)
Aware, per message left on voice mail,  pharmacist will try to help with medication.

## 2020-02-20 NOTE — Telephone Encounter (Signed)
Trulicity 65-month voucher left up front for pt to pick up Breztri inhaler sample [id number]

## 2020-02-20 NOTE — Telephone Encounter (Signed)
I'll see if Almyra Free might have some ideas for patient assistance.

## 2020-03-03 DIAGNOSIS — G894 Chronic pain syndrome: Secondary | ICD-10-CM | POA: Diagnosis not present

## 2020-03-03 DIAGNOSIS — M5416 Radiculopathy, lumbar region: Secondary | ICD-10-CM | POA: Diagnosis not present

## 2020-03-03 DIAGNOSIS — M961 Postlaminectomy syndrome, not elsewhere classified: Secondary | ICD-10-CM | POA: Diagnosis not present

## 2020-03-04 ENCOUNTER — Other Ambulatory Visit: Payer: Self-pay | Admitting: Family Medicine

## 2020-03-04 DIAGNOSIS — K219 Gastro-esophageal reflux disease without esophagitis: Secondary | ICD-10-CM

## 2020-03-05 ENCOUNTER — Telehealth: Payer: Self-pay | Admitting: Family Medicine

## 2020-03-05 NOTE — Telephone Encounter (Signed)
Patient aware, script is ready. 

## 2020-03-05 NOTE — Telephone Encounter (Signed)
  Prescription Request  03/05/2020  What is the name of the medication or equipment? Oliver   Have you contacted your pharmacy to request a refill? (if applicable) YES  Which pharmacy would you like this sent to? [facility]    Patient notified that their request is being sent to the clinical staff for review and that they should receive a response within 2 business days.

## 2020-03-26 ENCOUNTER — Ambulatory Visit: Payer: Medicare Other | Admitting: Physician Assistant

## 2020-04-10 ENCOUNTER — Other Ambulatory Visit: Payer: Self-pay | Admitting: Family Medicine

## 2020-04-10 DIAGNOSIS — E785 Hyperlipidemia, unspecified: Secondary | ICD-10-CM

## 2020-04-10 DIAGNOSIS — E1169 Type 2 diabetes mellitus with other specified complication: Secondary | ICD-10-CM

## 2020-04-13 ENCOUNTER — Ambulatory Visit: Payer: Medicare Other | Admitting: Family Medicine

## 2020-04-14 DIAGNOSIS — G894 Chronic pain syndrome: Secondary | ICD-10-CM | POA: Diagnosis not present

## 2020-04-14 DIAGNOSIS — M5416 Radiculopathy, lumbar region: Secondary | ICD-10-CM | POA: Diagnosis not present

## 2020-04-14 DIAGNOSIS — M961 Postlaminectomy syndrome, not elsewhere classified: Secondary | ICD-10-CM | POA: Diagnosis not present

## 2020-04-28 ENCOUNTER — Ambulatory Visit (INDEPENDENT_AMBULATORY_CARE_PROVIDER_SITE_OTHER): Payer: Medicare Other

## 2020-04-28 DIAGNOSIS — Z0000 Encounter for general adult medical examination without abnormal findings: Secondary | ICD-10-CM

## 2020-04-28 NOTE — Progress Notes (Signed)
MEDICARE ANNUAL WELLNESS VISIT  04/28/2020  Telephone Visit Disclaimer This Medicare AWV was conducted by telephone due to national recommendations for restrictions regarding the COVID-19 Pandemic (e.g. social distancing).  I verified, using two identifiers, that I am speaking with Lori Schroeder or their authorized healthcare agent. I discussed the limitations, risks, security, and privacy concerns of performing an evaluation and management service by telephone and the potential availability of an in-person appointment in the future. The patient expressed understanding and agreed to proceed.   Subjective:  Lori Schroeder is a 64 y.o. female patient of [name], DO who had a Medicare Annual Wellness Visit today via telephone. Lori Schroeder was located at her home during the time of the visit and I was here in the office at [facility]. She is very pleasant and lives here in [place]. She is married and currently resides with her husband, her daughter, and her daughters two children. She also has a son that lives in [place], [place]. She talks to him often but only sees him and his children a few times yearly. She is currently disabled. 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 She sees a pain clinic for the management of her chronic back pain. She is unable to exercise on a regular routine but stay busy around the house. She does't have any type of Advanced Directive and states that she knows its important and needs to be done.   Patient Care Team: [name], DO as PCP - General (Family Medicine) Iran Planas, MD as Consulting Physician (Orthopedic Surgery) Adline Potter, NP as Nurse Practitioner (Anesthesiology)  Advanced Directives 04/28/2020 03/19/2019 01/18/2016  Does Patient Have a Medical Advance Directive? No No No  Would patient like information on creating a medical advance directive? No - Patient  declined Yes (MAU/Ambulatory/Procedural Areas - Information given) -    Hospital Utilization Over the Past 12 Months: # of hospitalizations or ER visits: 0 # of surgeries: 0  Review of Systems    Patient reports that her overall health is unchanged compared to last year.  History obtained from chart review  Patient Reported Readings (BP, Pulse, CBG, Weight, etc) none  Pain Assessment Pain : 0-10 Pain Type: Chronic pain Pain Location: Back Pain Descriptors / Indicators: Burning, Shooting Pain Onset: More than a month ago Pain Frequency: Constant Pain Relieving Factors: Sees pain clinic for meds  Pain Relieving Factors: Sees pain clinic for meds  Current Medications & Allergies (verified) Allergies as of 04/28/2020   No Known Allergies     Medication List       Accurate as of April 28, 2020  1:35 PM. If you have any questions, ask your nurse or doctor.        STOP taking these medications   Nucynta 75 MG tablet Generic drug: tapentadol HCl     TAKE these medications   albuterol 108 (90 Base) MCG/ACT inhaler Commonly known as: ProAir HFA 2 PUFFS EVERY 6 HOURS AS NEEDED FOR WHEEZING OR SHORTNESS OF BREATH   atorvastatin 10 MG tablet Commonly known as: LIPITOR TAKE 1 TABLET EVERY DAY   diclofenac sodium 1 % Gel Commonly known as: VOLTAREN Apply 4 g topically 4 (four) times daily.   esomeprazole 40 MG capsule Commonly known as: NEXIUM TAKE (1) CAPSULE TWICE DAILY.   fluticasone 50 MCG/ACT nasal spray Commonly known as: FLONASE Place 2 sprays into both nostrils daily.   fluticasone-salmeterol  230-21 MCG/ACT inhaler Commonly known as: ADVAIR HFA Inhale 2 puffs into the lungs 2 (two) times daily.   lisinopril 5 MG tablet Commonly known as: ZESTRIL Take 1 tablet (5 mg total) by mouth daily.   naloxone 4 MG/0.1ML Liqd nasal spray kit Commonly known as: NARCAN Place 1 spray into the nose once.   pregabalin 50 MG capsule Commonly known as: LYRICA Take 50  mg by mouth daily.   pregabalin 300 MG capsule Commonly known as: LYRICA Take 300 mg by mouth daily.   tiZANidine 4 MG tablet Commonly known as: ZANAFLEX TAKE ONE TABLET BY MOUTH THREE TIMES DAILY AS NEEDED FOR MUSCLE SPASM   Trelegy Ellipta 100-62.5-25 MCG/INH Aepb Generic drug: Fluticasone-Umeclidin-Vilant Inhale 1 puff into the lungs daily.   Trulicity 1.5 DS/2.8JG Sopn Generic drug: Dulaglutide Inject 1.5 mg into the skin once a week.   venlafaxine XR 75 MG 24 hr capsule Commonly known as: EFFEXOR-XR venlafaxine ER 75 mg capsule,extended release 24 hr   Xtampza ER 13.5 MG C12a Generic drug: oxyCODONE ER Take by mouth.       History (reviewed): Past Medical History:  Diagnosis Date  . Blood transfusion without reported diagnosis   . External hemorrhoids without mention of complication [id number]   Colonoscopy  . Family history of malignant neoplasm of gastrointestinal tract   . Family history of malignant neoplasm of gastrointestinal tract   . GERD (gastroesophageal reflux disease)   . Hyperlipemia   . Hypertension    Past Surgical History:  Procedure Laterality Date  . ABDOMINAL HYSTERECTOMY    . BACK SURGERY    . BLADDER SURGERY     Bladder Tact  . CARPAL TUNNEL RELEASE Right   . COLONOSCOPY    . NECK SURGERY     Family History  Problem Relation Age of Onset  . Colon cancer Maternal Grandmother   . Alzheimer's disease Mother   . Hyperlipidemia Mother   . Hypertension Mother   . Cancer Father   . Alcohol abuse Father   . Diabetes Daughter        Type 1  . Breast cancer Neg Hx   . Celiac disease Neg Hx   . Cirrhosis Neg Hx   . Clotting disorder Neg Hx   . Colitis Neg Hx   . Colon polyps Neg Hx   . Crohn's disease Neg Hx   . Cystic fibrosis Neg Hx   . Esophageal cancer Neg Hx   . Heart disease Neg Hx   . Hemochromatosis Neg Hx   . Inflammatory bowel disease Neg Hx   . Irritable bowel syndrome Neg Hx   . Kidney disease Neg Hx   . Liver cancer  Neg Hx   . Liver disease Neg Hx   . Ovarian cancer Neg Hx   . Pancreatic cancer Neg Hx   . Prostate cancer Neg Hx   . Rectal cancer Neg Hx   . Stomach cancer Neg Hx   . Ulcerative colitis Neg Hx   . Uterine cancer Neg Hx   . Wilson's disease Neg Hx    Social History   Socioeconomic History  . Marital status: Married    Spouse name: Not on file  . Number of children: 2  . Years of education: 38  . Highest education level: Associate degree: occupational, Hotel manager, or vocational program  Occupational History  . Occupation: disability    Employer: DISABLED  Tobacco Use  . Smoking status: Current Every Day Smoker    Packs/day:  0.50    Years: 10.00    Pack years: 5.00    Types: Cigarettes  . Smokeless tobacco: Never Used  Vaping Use  . Vaping Use: Never used  Substance and Sexual Activity  . Alcohol use: No  . Drug use: No  . Sexual activity: Not on file  Other Topics Concern  . Not on file  Social History Narrative  . Not on file   Social Determinants of Health   Financial Resource Strain:   . Difficulty of Paying Living Expenses:   Food Insecurity:   . Worried About Charity fundraiser in the Last Year:   . Arboriculturist in the Last Year:   Transportation Needs:   . Film/video editor (Medical):   Marland Kitchen Lack of Transportation (Non-Medical):   Physical Activity:   . Days of Exercise per Week:   . Minutes of Exercise per Session:   Stress:   . Feeling of Stress :   Social Connections:   . Frequency of Communication with Friends and Family:   . Frequency of Social Gatherings with Friends and Family:   . Attends Religious Services:   . Active Member of Clubs or Organizations:   . Attends Archivist Meetings:   Marland Kitchen Marital Status:     Activities of Daily Living In your present state of health, do you have any difficulty performing the following activities: 04/28/2020  Hearing? N  Vision? Y  Comment Wears glasses almost all the time  Difficulty  concentrating or making decisions? N  Walking or climbing stairs? N  Doing errands, shopping? N  Preparing Food and eating ? N  Using the Toilet? N  In the past six months, have you accidently leaked urine? N  Do you have problems with loss of bowel control? N  Managing your Medications? N  Managing your Finances? N  Housekeeping or managing your Housekeeping? N  Some recent data might be hidden    Patient Education/ Literacy How often do you need to have someone help you when you read instructions, pamphlets, or other written materials from your doctor or pharmacy?: 1 - Never What is the last grade level you completed in school?: 12th grade  Exercise Current Exercise Habits: The patient does not participate in regular exercise at present, Exercise limited by: orthopedic condition(s)  Diet Patient reports consuming 3 meals a day and 2 snack(s) a day Patient reports that her primary diet is: Regular Patient reports that she does have regular access to food.   Depression Screen PHQ 2/9 Scores 02/03/2020 07/17/2019 03/19/2019 02/14/2019 10/31/2018 09/09/2018 08/27/2018  PHQ - 2 Score 0 0 0 0 0 0 0  PHQ- 9 Score 0 0 - 0 - 0 -     Fall Risk Fall Risk  03/19/2019 10/31/2018 09/09/2018 07/02/2018 06/17/2018  Falls in the past year? 1 0 0 Yes Yes  Number falls in past yr: 0 - - 1 1  Injury with Fall? 1 - - Yes Yes  Comment - - - - Finger injury  Follow up Falls prevention discussed;Education provided - - - -     Objective:  Lori Schroeder seemed alert and oriented and she participated appropriately during our telephone visit.  Blood Pressure Weight BMI  BP Readings from Last 3 Encounters:  02/03/20 136/75  07/17/19 122/71  02/14/19 115/70   Wt Readings from Last 3 Encounters:  02/03/20 123 lb (55.8 kg)  07/17/19 122 lb (55.3 kg)  02/14/19 123 lb (55.8  kg)   BMI Readings from Last 1 Encounters:  02/03/20 24.84 kg/m    *Unable to obtain current vital signs, weight, and BMI due to  telephone visit type  Hearing/Vision  . Juliahna did not seem to have difficulty with hearing/understanding during the telephone conversation . Reports that she has had a formal eye exam by an eye care professional within the past year . Reports that she has not had a formal hearing evaluation within the past year *Unable to fully assess hearing and vision during telephone visit type  Cognitive Function: 6CIT Screen 04/28/2020 03/19/2019  What Year? 0 points 0 points  What month? 0 points 0 points  What time? 0 points 0 points  Count back from 20 0 points 0 points  Months in reverse 0 points 0 points  Repeat phrase 0 points 0 points  Total Score 0 0   (Normal:0-7, Significant for Dysfunction: >8)  Normal Cognitive Function Screening: Yes   Immunization & Health Maintenance Record Immunization History  Administered Date(s) Administered  . Influenza-Unspecified 10/02/2018  . Pneumococcal Conjugate-13 05/29/2018  . Tdap 05/29/2018    Health Maintenance  Topic Date Due  . DEXA SCAN  Never done  . PAP SMEAR-Modifier  07/06/2016  . COVID-19 Vaccine (1) 05/14/2020 (Originally 05/16/1968)  . PNEUMOCOCCAL POLYSACCHARIDE VACCINE AGE 57-64 HIGH RISK  07/16/2020 (Originally 05/17/1958)  . MAMMOGRAM  05/28/2020  . OPHTHALMOLOGY EXAM  06/30/2020  . FOOT EXAM  07/16/2020  . HEMOGLOBIN A1C  08/05/2020  . COLONOSCOPY  11/06/2022  . TETANUS/TDAP  05/29/2028  . Hepatitis C Screening  Completed  . HIV Screening  Completed  . INFLUENZA VACCINE  Discontinued       Assessment  This is a routine wellness examination for Lori Schroeder.  Health Maintenance: Due or Overdue Health Maintenance Due  Topic Date Due  . DEXA SCAN  Never done  . PAP SMEAR-Modifier  07/06/2016    Lori Schroeder does not need a referral for Community Assistance: Care Management:   no Social Work:    no Prescription Assistance:  no Nutrition/Diabetes Education:  no   Plan:  Personalized Goals Goals Addressed              This Visit's Progress   . Exercise 3x per week (30 min per time)        Personalized Health Maintenance & Screening Recommendations  Bone densitometry screening  Lung Cancer Screening Recommended: no (Low Dose CT Chest recommended if Age 19-80 years, 30 pack-year currently smoking OR have quit w/in past 15 years) Hepatitis C Screening recommended: Completed HIV Screening recommended: Completed  Advanced Directives: Written information was not prepared per patient's request.  Referrals & Orders No orders of the defined types were placed in this encounter.   Follow-up Plan . Follow-up with [name], DO as planned . Schedule for a bone density test    I have personally reviewed and noted the following in the patient's chart:   . Medical and social history . Use of alcohol, tobacco or illicit drugs  . Current medications and supplements . Functional ability and status . Nutritional status . Physical activity . Advanced directives . List of other physicians . Hospitalizations, surgeries, and ER visits in previous 12 months . Vitals . Screenings to include cognitive, depression, and falls . Referrals and appointments  In addition, I have reviewed and discussed with Lori Schroeder certain preventive protocols, quality metrics, and best practice recommendations. A written personalized care plan for preventive services as  well as general preventive health recommendations is available and can be mailed to the patient at her request.      Rolena Infante LPN [id number]

## 2020-04-28 NOTE — Patient Instructions (Signed)
  Lori Schroeder , Thank you for taking time to come for your Medicare Wellness Visit. I appreciate your ongoing commitment to your health goals. Please review the following plan we discussed and let me know if I can assist you in the future.   These are the goals we discussed: Goals    . DIET - EAT MORE NON STARCHY VEGETABLES    . Exercise 3x per week (30 min per time)       This is a list of the screening recommended for you and due dates:  Health Maintenance  Topic Date Due  . DEXA scan (bone density measurement)  Never done  . Pap Smear  07/06/2016  . COVID-19 Vaccine (1) 05/14/2020*  . Pneumococcal vaccine  07/16/2020*  . Mammogram  05/28/2020  . Eye exam for diabetics  06/30/2020  . Complete foot exam   07/16/2020  . Hemoglobin A1C  08/05/2020  . Colon Cancer Screening  11/06/2022  . Tetanus Vaccine  05/29/2028  .  Hepatitis C: One time screening is recommended by Center for Disease Control  (CDC) for  adults born from [birth year] through 1964.   Completed  . HIV Screening  Completed  . Flu Shot  Discontinued  *Topic was postponed. The date shown is not the original due date.

## 2020-05-04 ENCOUNTER — Ambulatory Visit: Payer: Medicare Other | Admitting: Family Medicine

## 2020-05-12 ENCOUNTER — Other Ambulatory Visit: Payer: Self-pay | Admitting: Family Medicine

## 2020-05-12 ENCOUNTER — Other Ambulatory Visit: Payer: Self-pay

## 2020-05-12 DIAGNOSIS — I152 Hypertension secondary to endocrine disorders: Secondary | ICD-10-CM

## 2020-05-12 LAB — HM DIABETES EYE EXAM

## 2020-06-01 ENCOUNTER — Other Ambulatory Visit: Payer: Self-pay

## 2020-06-01 ENCOUNTER — Encounter: Payer: Self-pay | Admitting: Family Medicine

## 2020-06-01 ENCOUNTER — Ambulatory Visit (INDEPENDENT_AMBULATORY_CARE_PROVIDER_SITE_OTHER): Payer: Medicare Other

## 2020-06-01 ENCOUNTER — Ambulatory Visit (INDEPENDENT_AMBULATORY_CARE_PROVIDER_SITE_OTHER): Payer: Medicare Other | Admitting: Family Medicine

## 2020-06-01 VITALS — BP 94/51 | HR 80 | Temp 98.1°F | Ht 59.0 in | Wt 114.8 lb

## 2020-06-01 DIAGNOSIS — I152 Hypertension secondary to endocrine disorders: Secondary | ICD-10-CM

## 2020-06-01 DIAGNOSIS — E785 Hyperlipidemia, unspecified: Secondary | ICD-10-CM

## 2020-06-01 DIAGNOSIS — F17200 Nicotine dependence, unspecified, uncomplicated: Secondary | ICD-10-CM

## 2020-06-01 DIAGNOSIS — E1169 Type 2 diabetes mellitus with other specified complication: Secondary | ICD-10-CM

## 2020-06-01 DIAGNOSIS — R0602 Shortness of breath: Secondary | ICD-10-CM | POA: Diagnosis not present

## 2020-06-01 DIAGNOSIS — I10 Essential (primary) hypertension: Secondary | ICD-10-CM

## 2020-06-01 DIAGNOSIS — Z1152 Encounter for screening for COVID-19: Secondary | ICD-10-CM

## 2020-06-01 DIAGNOSIS — J410 Simple chronic bronchitis: Secondary | ICD-10-CM

## 2020-06-01 DIAGNOSIS — M2550 Pain in unspecified joint: Secondary | ICD-10-CM

## 2020-06-01 DIAGNOSIS — E1159 Type 2 diabetes mellitus with other circulatory complications: Secondary | ICD-10-CM

## 2020-06-01 LAB — BAYER DCA HB A1C WAIVED: HB A1C (BAYER DCA - WAIVED): 6.8 % (ref <7.0)

## 2020-06-01 NOTE — Patient Instructions (Signed)
If you choose to get the [place] vaccination, we are offering Moderna here.  You may call for an appointment at your convenience.  You had labs performed today.  You will be contacted with the results of the labs once they are available, usually in the next 3 business days for routine lab work.  If you have an active my chart account, they will be released to your MyChart.  If you prefer to have these labs released to you via telephone, please let us know.  If you had a pap smear or biopsy performed, expect to be contacted in about 7-10 days.

## 2020-06-01 NOTE — Progress Notes (Signed)
Subjective: CC: HTN, HLD, DM2 f/u PCP: Lori Norlander, DO STM:HDQQI Gergely is a 64 y.o. female presenting to clinic today for:  1. Type 2 Diabetes with hypertension and hyperlipidemia; tobacco use disorder:  Patient compliant with Trulicity, lisinopril and atorvastatin.  She occasionally does have some dizziness.  Blood pressures tend to run in the low 90s over 50s.  She does report some intermittent back/chest pain with deep breathing.  No hemoptysis, night sweats, unplanned weight loss, sore throat or change in voice.  She is an active smoker.  Additionally, breathing has not been as good on the [place] as it was on the Trelegy and she would like to switch back to this.  Last eye exam: UTD Last foot exam: UTD Last A1c:  Lab Results  Component Value Date   HGBA1C 6.7 02/03/2020   Nephropathy screen indicated?: on ACE-I Last flu, zoster and/or pneumovax:  Immunization History  Administered Date(s) Administered   Influenza-Unspecified 10/02/2018   Pneumococcal Conjugate-13 05/29/2018   Tdap 05/29/2018   2.  Polyarthralgia Patient reports polyarthralgia that seems to be worse in the joints of her hands.  Sometimes she does feel that the hands are swollen.  Is not reporting increased warmth or erythema.  Symptoms are refractory to topical NSAID.  ROS: Per HPI  No Known Allergies Past Medical History:  Diagnosis Date   Blood transfusion without reported diagnosis    External hemorrhoids without mention of complication [id number]   Colonoscopy   Family history of malignant neoplasm of gastrointestinal tract    Family history of malignant neoplasm of gastrointestinal tract    GERD (gastroesophageal reflux disease)    Hyperlipemia    Hypertension     Current Outpatient Medications:    albuterol (PROAIR HFA) 108 (90 Base) MCG/ACT inhaler, 2 PUFFS EVERY 6 HOURS AS NEEDED FOR WHEEZING OR SHORTNESS OF BREATH, Disp: 8.5 g, Rfl: 0   atorvastatin (LIPITOR) 10 MG tablet,  TAKE 1 TABLET EVERY DAY, Disp: 90 tablet, Rfl: 0   diclofenac sodium (VOLTAREN) 1 % GEL, Apply 4 g topically 4 (four) times daily., Disp: 400 g, Rfl: 3   Dulaglutide (TRULICITY) 1.5 JJ/9.4RD SOPN, Inject 1.5 mg into the skin once a week., Disp: 2 mL, Rfl: 12   esomeprazole (NEXIUM) 40 MG capsule, TAKE (1) CAPSULE TWICE DAILY., Disp: 60 capsule, Rfl: 3   fluticasone (FLONASE) 50 MCG/ACT nasal spray, Place 2 sprays into both nostrils daily., Disp: 16 g, Rfl: 6   fluticasone-salmeterol (ADVAIR HFA) 230-21 MCG/ACT inhaler, Inhale 2 puffs into the lungs 2 (two) times daily., Disp: 1 Inhaler, Rfl: 3   Fluticasone-Umeclidin-Vilant (TRELEGY ELLIPTA) 100-62.5-25 MCG/INH AEPB, Inhale 1 puff into the lungs daily., Disp: , Rfl:    lisinopril (ZESTRIL) 5 MG tablet, TAKE 1 TABLET EVERY DAY, Disp: 90 tablet, Rfl: 0   naloxone (NARCAN) nasal spray 4 mg/0.1 mL, Place 1 spray into the nose once., Disp: , Rfl:    pregabalin (LYRICA) 300 MG capsule, Take 300 mg by mouth daily. , Disp: , Rfl:    pregabalin (LYRICA) 50 MG capsule, Take 50 mg by mouth daily., Disp: , Rfl:    tiZANidine (ZANAFLEX) 4 MG tablet, TAKE ONE TABLET BY MOUTH THREE TIMES DAILY AS NEEDED FOR MUSCLE SPASM, Disp: , Rfl:    venlafaxine XR (EFFEXOR-XR) 75 MG 24 hr capsule, venlafaxine ER 75 mg capsule,extended release 24 hr, Disp: , Rfl:  Social History   Socioeconomic History   Marital status: Married    Spouse name: Not on  file   Number of children: 2   Years of education: 14   Highest education level: Associate degree: occupational, Hotel manager, or vocational program  Occupational History   Occupation: disability    Employer: DISABLED  Tobacco Use   Smoking status: Current Every Day Smoker    Packs/day: 0.50    Years: 10.00    Pack years: 5.00    Types: Cigarettes   Smokeless tobacco: Never Used  Vaping Use   Vaping Use: Never used  Substance and Sexual Activity   Alcohol use: No   Drug use: No   Sexual  activity: Not on file  Other Topics Concern   Not on file  Social History Narrative   Not on file   Social Determinants of Health   Financial Resource Strain:    Difficulty of Paying Living Expenses: Not on file  Food Insecurity:    Worried About Charity fundraiser in the Last Year: Not on file   [employer] of Food in the Last Year: Not on file  Transportation Needs:    Lack of Transportation (Medical): Not on file   Lack of Transportation (Non-Medical): Not on file  Physical Activity:    Days of Exercise per Week: Not on file   Minutes of Exercise per Session: Not on file  Stress:    Feeling of Stress : Not on file  Social Connections:    Frequency of Communication with Friends and Family: Not on file   Frequency of Social Gatherings with Friends and Family: Not on file   Attends Religious Services: Not on file   Active Member of Clubs or Organizations: Not on file   Attends Archivist Meetings: Not on file   Marital Status: Not on file  Intimate Partner Violence:    Fear of Current or Ex-Partner: Not on file   Emotionally Abused: Not on file   Physically Abused: Not on file   Sexually Abused: Not on file   Family History  Problem Relation Age of Onset   Colon cancer Maternal Grandmother    Alzheimer's disease Mother    Hyperlipidemia Mother    Hypertension Mother    Cancer Father    Alcohol abuse Father    Diabetes Daughter        Type 1   Breast cancer Neg Hx    Celiac disease Neg Hx    Cirrhosis Neg Hx    Clotting disorder Neg Hx    Colitis Neg Hx    Colon polyps Neg Hx    Crohn's disease Neg Hx    Cystic fibrosis Neg Hx    Esophageal cancer Neg Hx    Heart disease Neg Hx    Hemochromatosis Neg Hx    Inflammatory bowel disease Neg Hx    Irritable bowel syndrome Neg Hx    Kidney disease Neg Hx    Liver cancer Neg Hx    Liver disease Neg Hx    Ovarian cancer Neg Hx    Pancreatic cancer Neg Hx     Prostate cancer Neg Hx    Rectal cancer Neg Hx    Stomach cancer Neg Hx    Ulcerative colitis Neg Hx    Uterine cancer Neg Hx    Wilson's disease Neg Hx     Objective: Office vital signs reviewed. BP (!) 94/51    Pulse 80    Temp 98.1 F (36.7 C)    Ht 4\' 11"  (1.499 m)    Wt 114  lb 12.8 oz (52.1 kg)    SpO2 96%    BMI 23.19 kg/m   Physical Examination:  General: Awake, alert, well nourished, well appearing female.  No acute distress HEENT: Normal. MMM Cardio: regular rate and rhythm, S1S2 heard, no murmurs appreciated Pulm: clear to auscultation bilaterally, no wheezes, rhonchi or rales.  Normal work of breathing on room air Extremities: warm, well perfused, No edema, cyanosis or clubbing; +2 pulses bilaterally MSK: normal gait and station; she has some mild arthritic changes noted in the hands but no gross joint swelling, increased warmth or deformity noted on exam  No results found.  Assessment/ Plan: 64 y.o. female   1. Controlled type 2 diabetes mellitus with other specified complication, without long-term current use of insulin (HCC) A1c under good control with current regimen. - Bayer DCA Hb A1c Waived  2. Hypertension associated with diabetes ([place]) Stop lisinopril.  Monitor blood pressure closely.  Risks of medication may be outweighing benefits at this point  3. Hyperlipidemia associated with type 2 diabetes mellitus ([place]) Continue statin  4. Simple chronic bronchitis ([place]) I have given her 2 samples of Trelegy.  She will continue to call as needed for samples. - DG Chest 2 View; Future  5. Tobacco use disorder Cessation reinforced.  Check chest x-ray given reports of some pain with deep breathing.  Pulmonary exam was unremarkable today - DG Chest 2 View; Future - CBC  6. Polyarthralgia Check autoimmune labs - Sedimentation Rate - C-reactive protein - Rheumatoid factor - ANA w/Reflex if Positive - CBC  7. Encounter for screening for COVID-19 Patient  asking for COVID-19 antibody screen - SARS-CoV-2 Antibodies; Future - SARS-CoV-2 Antibodies   No orders of the defined types were placed in this encounter.  No orders of the defined types were placed in this encounter.   Lori Norlander, DO [place] [phone]

## 2020-06-02 ENCOUNTER — Telehealth: Payer: Self-pay | Admitting: Family Medicine

## 2020-06-02 LAB — CBC
Hematocrit: 43.3 % (ref 34.0–46.6)
Hemoglobin: 14.6 g/dL (ref 11.1–15.9)
MCH: 29.4 pg (ref 26.6–33.0)
MCHC: 33.7 g/dL (ref 31.5–35.7)
MCV: 87 fL (ref 79–97)
Platelets: 514 10*3/uL — ABNORMAL HIGH (ref 150–450)
RBC: 4.96 x10E6/uL (ref 3.77–5.28)
RDW: 13.2 % (ref 11.7–15.4)
WBC: 15.2 10*3/uL — ABNORMAL HIGH (ref 3.4–10.8)

## 2020-06-02 LAB — RHEUMATOID FACTOR: Rheumatoid fact SerPl-aCnc: <10 IU/mL (ref 0.0–13.9)

## 2020-06-02 LAB — C-REACTIVE PROTEIN: CRP: 11 mg/L — ABNORMAL HIGH (ref 0–10)

## 2020-06-02 LAB — SEDIMENTATION RATE: Sed Rate: 21 mm/hr (ref 0–40)

## 2020-06-02 LAB — SARS-COV-2 ANTIBODIES: SARS-CoV-2 Antibodies: NEGATIVE

## 2020-06-02 LAB — ANA W/REFLEX IF POSITIVE: Anti Nuclear Antibody (ANA): NEGATIVE

## 2020-06-02 NOTE — Telephone Encounter (Signed)
Refer to result notes.

## 2020-06-15 DIAGNOSIS — Z79899 Other long term (current) drug therapy: Secondary | ICD-10-CM | POA: Diagnosis not present

## 2020-06-15 DIAGNOSIS — Z5181 Encounter for therapeutic drug level monitoring: Secondary | ICD-10-CM | POA: Diagnosis not present

## 2020-06-15 DIAGNOSIS — G894 Chronic pain syndrome: Secondary | ICD-10-CM | POA: Diagnosis not present

## 2020-07-02 ENCOUNTER — Telehealth: Payer: Self-pay

## 2020-07-02 MED ORDER — TRELEGY ELLIPTA 100-62.5-25 MCG/INH IN AEPB: 1.0000 | INHALATION_SPRAY | Freq: Every day | RESPIRATORY_TRACT | 0 refills | Status: DC

## 2020-07-02 NOTE — Telephone Encounter (Signed)
Patient aware and sample up front to pick up.

## 2020-07-14 ENCOUNTER — Other Ambulatory Visit: Payer: Self-pay | Admitting: Family Medicine

## 2020-07-14 DIAGNOSIS — E785 Hyperlipidemia, unspecified: Secondary | ICD-10-CM

## 2020-07-14 DIAGNOSIS — E1169 Type 2 diabetes mellitus with other specified complication: Secondary | ICD-10-CM

## 2020-07-20 ENCOUNTER — Other Ambulatory Visit: Payer: Self-pay | Admitting: Family Medicine

## 2020-07-20 DIAGNOSIS — K219 Gastro-esophageal reflux disease without esophagitis: Secondary | ICD-10-CM

## 2020-08-02 ENCOUNTER — Encounter: Payer: Self-pay | Admitting: Family

## 2020-08-02 ENCOUNTER — Ambulatory Visit (INDEPENDENT_AMBULATORY_CARE_PROVIDER_SITE_OTHER): Payer: Medicare Other | Admitting: Family

## 2020-08-02 VITALS — BP 112/58 | HR 72 | Temp 98.5°F | Ht 59.0 in | Wt 114.0 lb

## 2020-08-02 DIAGNOSIS — J441 Chronic obstructive pulmonary disease with (acute) exacerbation: Secondary | ICD-10-CM | POA: Diagnosis not present

## 2020-08-02 DIAGNOSIS — R0602 Shortness of breath: Secondary | ICD-10-CM

## 2020-08-02 DIAGNOSIS — F17200 Nicotine dependence, unspecified, uncomplicated: Secondary | ICD-10-CM

## 2020-08-02 MED ORDER — AZITHROMYCIN 250 MG PO TABS: ORAL_TABLET | ORAL | 0 refills | Status: DC

## 2020-08-02 MED ORDER — DEXAMETHASONE 6 MG PO TABS: 6.0000 mg | ORAL_TABLET | Freq: Two times a day (BID) | ORAL | 0 refills | Status: AC

## 2020-08-02 NOTE — Progress Notes (Signed)
Subjective:    Patient ID: Lori Schroeder, female    DOB: 07/06/1956, 64 y.o.   MRN: [id number]  Chief Complaint  Patient presents with  . Shortness of Breath    symptoms started late friday   . Nasal Congestion  . Otalgia    right ear   . Cough    Shortness of Breath This is a new problem. Associated symptoms include ear pain, rhinorrhea and wheezing. Pertinent negatives include no fever, headaches or sore throat.  Otalgia  Associated symptoms include coughing and rhinorrhea. Pertinent negatives include no headaches or sore throat.  Cough This is a new problem. The current episode started in the past 7 days. The problem has been gradually worsening. The problem occurs every few minutes. The cough is productive of purulent sputum. Associated symptoms include ear congestion, ear pain, nasal congestion, postnasal drip, rhinorrhea, shortness of breath and wheezing. Pertinent negatives include no chills, fever, headaches, myalgias or sore throat. Risk factors for lung disease include smoking/tobacco exposure. She has tried OTC inhaler for the symptoms. The treatment provided mild relief.  Nicotine Dependence Presents for follow-up visit. Symptoms are negative for sore throat. Her urge triggers include company of smokers. The symptoms have been stable. She smokes < 1/2 a pack (decreased this from 1 1/2 pack a day) of cigarettes per day.      Review of Systems  Constitutional: Negative for chills and fever.  HENT: Positive for ear pain, postnasal drip and rhinorrhea. Negative for sore throat.   Respiratory: Positive for cough, shortness of breath and wheezing.   Musculoskeletal: Negative for myalgias.  Neurological: Negative for headaches.  All other systems reviewed and are negative.      Objective:   Physical Exam Vitals reviewed.  Constitutional:      General: She is not in acute distress.    Appearance: She is well-developed.  HENT:     Head: Normocephalic and atraumatic.      Right Ear: External ear normal. Tenderness present. A middle ear effusion is present.  Eyes:     Pupils: Pupils are equal, round, and reactive to light.  Neck:     Thyroid: No thyromegaly.  Cardiovascular:     Rate and Rhythm: Normal rate and regular rhythm.     Heart sounds: Normal heart sounds. No murmur heard.   Pulmonary:     Effort: Pulmonary effort is normal. No respiratory distress.     Breath sounds: Rhonchi present. No wheezing.  Abdominal:     General: Bowel sounds are normal. There is no distension.     Palpations: Abdomen is soft.     Tenderness: There is no abdominal tenderness.  Musculoskeletal:        General: No tenderness. Normal range of motion.     Cervical back: Normal range of motion and neck supple.  Skin:    General: Skin is warm and dry.  Neurological:     Mental Status: She is alert and oriented to person, place, and time.     Cranial Nerves: No cranial nerve deficit.     Deep Tendon Reflexes: Reflexes are normal and symmetric.  Psychiatric:        Behavior: Behavior normal.        Thought Content: Thought content normal.        Judgment: Judgment normal.      BP (!) 112/58   Pulse 72   Temp 98.5 F (36.9 C) (Temporal)   Ht 4\' 11"  (1.499 m)  Wt 114 lb (51.7 kg)   SpO2 96%   BMI 23.03 kg/m       Assessment & Plan:  Lori Schroeder comes in today with chief complaint of Shortness of Breath (symptoms started late friday ), Nasal Congestion, Otalgia (right ear ), and Cough   Diagnosis and orders addressed:  1. SOB (shortness of breath) - Novel Coronavirus, NAA ([facility]) - azithromycin (ZITHROMAX) 250 MG tablet; Take 500 mg once, then 250 mg for four days  Dispense: 6 tablet; Refill: 0 - dexamethasone (DECADRON) 6 MG tablet; Take 1 tablet (6 mg total) by mouth 2 (two) times daily with a meal for 7 days.  Dispense: 14 tablet; Refill: 0  2. Tobacco use disorder - azithromycin (ZITHROMAX) 250 MG tablet; Take 500 mg once, then 250 mg for four days   Dispense: 6 tablet; Refill: 0 - dexamethasone (DECADRON) 6 MG tablet; Take 1 tablet (6 mg total) by mouth 2 (two) times daily with a meal for 7 days.  Dispense: 14 tablet; Refill: 0  3. COPD exacerbation ([place]) - Take meds as prescribed - Use a cool mist humidifier  -Use saline nose sprays frequently -Force fluids -For any cough or congestion  Use plain Mucinex- regular strength or max strength is fine -For fever or aces or pains- take tylenol or ibuprofen. -Throat lozenges if help -RTO if symptoms worsen or do not improve  - azithromycin (ZITHROMAX) 250 MG tablet; Take 500 mg once, then 250 mg for four days  Dispense: 6 tablet; Refill: 0 - dexamethasone (DECADRON) 6 MG tablet; Take 1 tablet (6 mg total) by mouth 2 (two) times daily with a meal for 7 days.  Dispense: 14 tablet; Refill: 0   Lori Dun, FNP

## 2020-08-02 NOTE — Patient Instructions (Signed)
Acute Bronchitis, Adult  Acute bronchitis is sudden or acute swelling of the air tubes (bronchi) in the lungs. Acute bronchitis causes these tubes to fill with mucus, which can make it hard to breathe. It can also cause coughing or wheezing. In adults, acute bronchitis usually goes away within 2 weeks. A cough caused by bronchitis may last up to 3 weeks. Smoking, allergies, and asthma can make the condition worse. What are the causes? This condition can be caused by germs and by substances that irritate the lungs, including:  Cold and flu viruses. The most common cause of this condition is the virus that causes the common cold.  Bacteria.  Substances that irritate the lungs, including: ? Smoke from cigarettes and other forms of tobacco. ? Dust and pollen. ? Fumes from chemical products, gases, or burned fuel. ? Other materials that pollute indoor or outdoor air.  Close contact with someone who has acute bronchitis. What increases the risk? The following factors may make you more likely to develop this condition:  A weak body's defense system, also called the immune system.  A condition that affects your lungs and breathing, such as asthma. What are the signs or symptoms? Common symptoms of this condition include:  Lung and breathing problems, such as: ? Coughing. This may bring up clear, yellow, or green mucus from your lungs (sputum). ? Wheezing. ? Having too much mucus in your lungs (chest congestion). ? Having shortness of breath.  A fever.  Chills.  Aches and pains, including: ? Tightness in your chest and other body aches. ? A sore throat. How is this diagnosed? This condition is usually diagnosed based on:  Your symptoms and medical history.  A physical exam. You may also have other tests, including tests to rule out other conditions, such pneumonia. These tests include:  A test of lung function.  Test of a mucus sample to look for the presence of  bacteria.  Tests to check the oxygen level in your blood.  Blood tests.  Chest X-ray. How is this treated? Most cases of acute bronchitis clear up over time without treatment. Your health care provider may recommend:  Drinking more fluids. This can thin your mucus, which may improve your breathing.  Taking a medicine for a fever or cough.  Using a device that gets medicine into your lungs (inhaler) to help improve breathing and control coughing.  Using a vaporizer or a humidifier. These are machines that add water to the air to help you breathe better. Follow these instructions at home: Activity  Get plenty of rest.  Return to your normal activities as told by your health care provider. Ask your health care provider what activities are safe for you. Lifestyle  Drink enough fluid to keep your urine pale yellow.  Do not drink alcohol.  Do not use any products that contain nicotine or tobacco, such as cigarettes, e-cigarettes, and chewing tobacco. If you need help quitting, ask your health care provider. Be aware that: ? Your bronchitis will get worse if you smoke or breathe in other people's smoke (secondhand smoke). ? Your lungs will heal faster if you quit smoking. General instructions   Take over-the-counter and prescription medicines only as told by your health care provider.  Use an inhaler, vaporizer, or humidifier as told by your health care provider.  If you have a sore throat, gargle with a salt-water mixture 3-4 times a day or as needed. To make a salt-water mixture, completely dissolve -1 tsp (3-6   g) of salt in 1 cup (237 mL) of warm water.  Keep all follow-up visits as told by your health care provider. This is important. How is this prevented? To lower your risk of getting this condition again:  Wash your hands often with soap and water. If soap and water are not available, use hand sanitizer.  Avoid contact with people who have cold symptoms.  Try not to  touch your mouth, nose, or eyes with your hands.  Avoid places where there are fumes from chemicals. Breathing these fumes will make your condition worse.  Get the flu shot every year. Contact a health care provider if:  Your symptoms do not improve after 2 weeks of treatment.  You vomit more than once or twice.  You have symptoms of dehydration such as: ? Dark urine. ? Dry skin or eyes. ? Increased thirst. ? Headaches. ? Confusion. ? Muscle cramps. Get help right away if you:  Cough up blood.  Feel pain in your chest.  Have severe shortness of breath.  Faint or keep feeling like you are going to faint.  Have a severe headache.  Have fever or chills that get worse. These symptoms may represent a serious problem that is an emergency. Do not wait to see if the symptoms will go away. Get medical help right away. Call your local emergency services (911 in the [place] Do not drive yourself to the hospital. Summary  Acute bronchitis is sudden (acute) inflammation of the air tubes (bronchi) between the windpipe and the lungs. In adults, acute bronchitis usually goes away within 2 weeks, although coughing may last 3 weeks or longer  Take over-the-counter and prescription medicines only as told by your health care provider.  Drink enough fluid to keep your urine pale yellow.  Contact a health care provider if your symptoms do not improve after 2 weeks of treatment.  Get help right away if you cough up blood, faint, or have chest pain or shortness of breath. This information is not intended to replace advice given to you by your health care provider. Make sure you discuss any questions you have with your health care provider. Document Revised: 06/02/2019 Document Reviewed: 04/11/2019 Elsevier Patient Education  2020 [employer]  

## 2020-08-03 LAB — SARS-COV-2, NAA 2 DAY TAT

## 2020-08-03 LAB — NOVEL CORONAVIRUS, NAA: SARS-CoV-2, NAA: NOT DETECTED

## 2020-08-09 ENCOUNTER — Telehealth: Payer: Self-pay

## 2020-08-09 MED ORDER — DOXYCYCLINE HYCLATE 100 MG PO TABS: 100.0000 mg | ORAL_TABLET | Freq: Two times a day (BID) | ORAL | 0 refills | Status: DC

## 2020-08-09 NOTE — Telephone Encounter (Signed)
Patient aware and verbalized understanding. °

## 2020-08-09 NOTE — Telephone Encounter (Signed)
Doxycycline Prescription sent to pharmacy. If symptoms worsen or do not improve, you will need to be seen.

## 2020-08-10 DIAGNOSIS — M545 Low back pain, unspecified: Secondary | ICD-10-CM | POA: Diagnosis not present

## 2020-08-10 DIAGNOSIS — M961 Postlaminectomy syndrome, not elsewhere classified: Secondary | ICD-10-CM | POA: Diagnosis not present

## 2020-08-10 DIAGNOSIS — G894 Chronic pain syndrome: Secondary | ICD-10-CM | POA: Diagnosis not present

## 2020-08-10 DIAGNOSIS — M5416 Radiculopathy, lumbar region: Secondary | ICD-10-CM | POA: Diagnosis not present

## 2020-08-20 ENCOUNTER — Ambulatory Visit (INDEPENDENT_AMBULATORY_CARE_PROVIDER_SITE_OTHER): Payer: Medicare Other | Admitting: Family Medicine

## 2020-08-20 ENCOUNTER — Other Ambulatory Visit: Payer: Self-pay | Admitting: Family Medicine

## 2020-08-20 DIAGNOSIS — J441 Chronic obstructive pulmonary disease with (acute) exacerbation: Secondary | ICD-10-CM

## 2020-08-20 DIAGNOSIS — J069 Acute upper respiratory infection, unspecified: Secondary | ICD-10-CM

## 2020-08-20 DIAGNOSIS — R7982 Elevated C-reactive protein (CRP): Secondary | ICD-10-CM | POA: Diagnosis not present

## 2020-08-20 DIAGNOSIS — M2550 Pain in unspecified joint: Secondary | ICD-10-CM | POA: Diagnosis not present

## 2020-08-20 DIAGNOSIS — E119 Type 2 diabetes mellitus without complications: Secondary | ICD-10-CM

## 2020-08-20 MED ORDER — ALBUTEROL SULFATE HFA 108 (90 BASE) MCG/ACT IN AERS: INHALATION_SPRAY | RESPIRATORY_TRACT | 0 refills | Status: DC

## 2020-08-20 NOTE — Progress Notes (Signed)
Telephone visit  Subjective: CC: URI PCP: [name], DO AST:[id number] Lori Schroeder is a 64 y.o. female calls for telephone consult today. Patient provides verbal consent for consult held via phone.  Due to COVID-19 pandemic this visit was conducted virtually. This visit type was conducted due to national recommendations for restrictions regarding the COVID-19 Pandemic (e.g. social distancing, sheltering in place) in an effort to limit this patient's exposure and mitigate transmission in our community. All issues noted in this document were discussed and addressed.  A physical exam was not performed with this format.   Location of patient: home Location of provider: [facility] Others present for call: none  1. URI Patient reports being treated with Zpak and dexamethasone.  She was later treated with doxycycline.  She has ongoing coughing, wheezing and shortness of breath despite use of Trelegy and completion of the after mentioned medications.  No reports of fever, myalgia.  Her Covid test was negative.  She has not been using the albuterol because she did not realize she could use this in addition to the Trelegy.  2.  Polyarthralgia Patient with ongoing polyarthralgia.  She notes that she had an exquisite result with the dexamethasone for her URI.  She had about 80% improvement in her joint pain and swelling.  To summarize she had arthritis labs done in August which did show a mildly elevated CRP and white blood cell count.  She is very interested in seeing an arthritis doctor for further evaluation of possible   ROS: Per HPI  No Known Allergies Past Medical History:  Diagnosis Date   Blood transfusion without reported diagnosis    External hemorrhoids without mention of complication [id number]   Colonoscopy   Family history of malignant neoplasm of gastrointestinal tract    Family history of malignant neoplasm of gastrointestinal tract    GERD (gastroesophageal reflux disease)     Hyperlipemia    Hypertension     Current Outpatient Medications:    albuterol (PROAIR HFA) 108 (90 Base) MCG/ACT inhaler, 2 PUFFS EVERY 6 HOURS AS NEEDED FOR WHEEZING OR SHORTNESS OF BREATH, Disp: 8.5 g, Rfl: 0   atorvastatin (LIPITOR) 10 MG tablet, TAKE 1 TABLET EVERY DAY, Disp: 90 tablet, Rfl: 1   azithromycin (ZITHROMAX) 250 MG tablet, Take 500 mg once, then 250 mg for four days, Disp: 6 tablet, Rfl: 0   diclofenac sodium (VOLTAREN) 1 % GEL, Apply 4 g topically 4 (four) times daily., Disp: 400 g, Rfl: 3   doxycycline (VIBRA-TABS) 100 MG tablet, Take 1 tablet (100 mg total) by mouth 2 (two) times daily., Disp: 20 tablet, Rfl: 0   esomeprazole (NEXIUM) 40 MG capsule, TAKE (1) CAPSULE TWICE DAILY., Disp: 60 capsule, Rfl: 1   fluticasone (FLONASE) 50 MCG/ACT nasal spray, Place 2 sprays into both nostrils daily., Disp: 16 g, Rfl: 6   Fluticasone-Umeclidin-Vilant (TRELEGY ELLIPTA) 100-62.5-25 MCG/INH AEPB, Inhale 1 puff into the lungs daily., Disp: 2 each, Rfl: 0   naloxone (NARCAN) nasal spray 4 mg/0.1 mL, Place 1 spray into the nose once., Disp: , Rfl:    pregabalin (LYRICA) 300 MG capsule, Take 300 mg by mouth daily. , Disp: , Rfl:    pregabalin (LYRICA) 50 MG capsule, Take 50 mg by mouth daily., Disp: , Rfl:    tiZANidine (ZANAFLEX) 4 MG tablet, TAKE ONE TABLET BY MOUTH THREE TIMES DAILY AS NEEDED FOR MUSCLE SPASM, Disp: , Rfl:    TRULICITY 1.5 QJ/1.9ER SOPN, Inject 1.5 mg into the skin once a week.,  Disp: 2 mL, Rfl: 4   venlafaxine XR (EFFEXOR-XR) 75 MG 24 hr capsule, venlafaxine ER 75 mg capsule,extended release 24 hr, Disp: , Rfl:   Assessment/ Plan: 64 y.o. female   COPD exacerbation ([place]) - Plan: albuterol (PROAIR HFA) 108 (90 Base) MCG/ACT inhaler  URI with cough and congestion - Plan: albuterol (PROAIR HFA) 108 (90 Base) MCG/ACT inhaler  Polyarthralgia - Plan: Ambulatory referral to Rheumatology  Elevated C-reactive protein (CRP) - Plan: Ambulatory referral to  Rheumatology  Status post steroids and to antibiotics.  I do not think there is a need for ongoing antibiotic treatment.  However, I do think that she should be using the albuterol more frequently.  We discussed 2 puffs every 6 hours scheduled for the next 2 days and as needed as directed.  Continue Trelegy.  If no significant improvement over the weekend, highly recommend reevaluation and likely a chest x-ray.  She voiced good understanding of the plan and will follow up as directed  With regards to her polyarthralgia, there is a concern for possible inflammatory etiology given elevated WBCs and CRP.  Of note her ANA and RF were negative.  She had an exquisite response to the dexamethasone.  I have placed a referral to rheumatology for further evaluation   Start time: 1:04pm End time: 1:15pm  Total time spent on patient care (including telephone call/ virtual visit): 11 minutes  [place], [place] [phone]

## 2020-08-23 ENCOUNTER — Other Ambulatory Visit: Payer: Self-pay

## 2020-08-23 ENCOUNTER — Ambulatory Visit (INDEPENDENT_AMBULATORY_CARE_PROVIDER_SITE_OTHER): Payer: Medicare Other

## 2020-08-23 DIAGNOSIS — J069 Acute upper respiratory infection, unspecified: Secondary | ICD-10-CM

## 2020-08-23 DIAGNOSIS — R059 Cough, unspecified: Secondary | ICD-10-CM | POA: Diagnosis not present

## 2020-08-24 ENCOUNTER — Ambulatory Visit (INDEPENDENT_AMBULATORY_CARE_PROVIDER_SITE_OTHER): Payer: Medicare Other | Admitting: Family

## 2020-08-24 ENCOUNTER — Encounter: Payer: Self-pay | Admitting: Family

## 2020-08-24 VITALS — BP 124/66 | HR 73 | Temp 97.6°F | Ht 59.0 in | Wt 115.0 lb

## 2020-08-24 DIAGNOSIS — J069 Acute upper respiratory infection, unspecified: Secondary | ICD-10-CM

## 2020-08-24 DIAGNOSIS — J441 Chronic obstructive pulmonary disease with (acute) exacerbation: Secondary | ICD-10-CM | POA: Diagnosis not present

## 2020-08-24 DIAGNOSIS — F17200 Nicotine dependence, unspecified, uncomplicated: Secondary | ICD-10-CM

## 2020-08-24 MED ORDER — PREDNISONE 20 MG PO TABS: ORAL_TABLET | ORAL | 0 refills | Status: DC

## 2020-08-24 NOTE — Progress Notes (Signed)
Subjective:    Patient ID: Lori Schroeder, female    DOB: Aug 18, 1956, 64 y.o.   MRN: [id number]  Chief Complaint  Patient presents with  . Cough  . Nasal Congestion  . Fatigue   Pt presents to the office today with recurrent cough that started on 08/02/20. She was given Zpak and Dexamethasone. She called and her symptoms had not improved and she was given doxycyline. She reports she was feeling slightly better, but then last week she started having chest tightness and congestion.  She had a telephone visit with her PCP on 08/20/20 and recommend using albuterol and Trelegy. Patient reports she has been doing this, but has not helped.   Cough This is a recurrent problem. The current episode started 1 to 4 weeks ago. The problem has been waxing and waning. The problem occurs every few minutes. The cough is productive of purulent sputum. Associated symptoms include ear congestion, ear pain, headaches, myalgias, nasal congestion, postnasal drip, rhinorrhea, shortness of breath and wheezing. Pertinent negatives include no chills, fever or sore throat. The symptoms are aggravated by lying down. Risk factors for lung disease include smoking/tobacco exposure. She has tried rest for the symptoms.     Review of Systems  Constitutional: Negative for chills and fever.  HENT: Positive for ear pain, postnasal drip and rhinorrhea. Negative for sore throat.   Respiratory: Positive for cough, shortness of breath and wheezing.   Musculoskeletal: Positive for myalgias.  Neurological: Positive for headaches.  All other systems reviewed and are negative.      Objective:   Physical Exam Vitals reviewed.  Constitutional:      General: She is not in acute distress.    Appearance: She is well-developed.  HENT:     Head: Normocephalic and atraumatic.     Right Ear: Tympanic membrane normal.     Left Ear: Tympanic membrane normal.  Eyes:     Pupils: Pupils are equal, round, and reactive to light.  Neck:      Thyroid: No thyromegaly.  Cardiovascular:     Rate and Rhythm: Normal rate and regular rhythm.     Heart sounds: Murmur heard.   Pulmonary:     Effort: Pulmonary effort is normal. No respiratory distress.     Breath sounds: Wheezing and rhonchi present.     Comments: Intermittent coarse cough Abdominal:     General: Bowel sounds are normal. There is no distension.     Palpations: Abdomen is soft.     Tenderness: There is no abdominal tenderness.  Musculoskeletal:        General: No tenderness. Normal range of motion.     Cervical back: Normal range of motion and neck supple.  Skin:    General: Skin is warm and dry.  Neurological:     Mental Status: She is alert and oriented to person, place, and time.     Cranial Nerves: No cranial nerve deficit.     Deep Tendon Reflexes: Reflexes are normal and symmetric.  Psychiatric:        Behavior: Behavior normal.        Thought Content: Thought content normal.        Judgment: Judgment normal.       BP 124/66   Pulse 73   Temp 97.6 F (36.4 C) (Temporal)   Ht 4\' 11"  (1.499 m)   Wt 115 lb (52.2 kg)   SpO2 98%   BMI 23.23 kg/m      Assessment &  Plan:  Sahasra Belue comes in today with chief complaint of Cough, Nasal Congestion, and Fatigue   Diagnosis and orders addressed:  1. URI with cough and congestion - Novel Coronavirus, NAA ([facility])  2. COPD exacerbation ([facility]) - predniSONE (DELTASONE) 20 MG tablet; Take 3 tabs daily for 1 week, then 2 tabs for 1 week, then 1 tab for one week  Dispense: 42 tablet; Refill: 0  3. Current smoker - predniSONE (DELTASONE) 20 MG tablet; Take 3 tabs daily for 1 week, then 2 tabs for 1 week, then 1 tab for one week  Dispense: 42 tablet; Refill: 0   Stop smoking  Will give prednisone today because of wheezing and rhonchi Strict low carb diet  Force fluids Daily mucinex    [name], FNP

## 2020-08-24 NOTE — Patient Instructions (Signed)

## 2020-08-25 LAB — SARS-COV-2, NAA 2 DAY TAT

## 2020-08-25 LAB — NOVEL CORONAVIRUS, NAA: SARS-CoV-2, NAA: NOT DETECTED

## 2020-09-02 ENCOUNTER — Ambulatory Visit (INDEPENDENT_AMBULATORY_CARE_PROVIDER_SITE_OTHER): Payer: Medicare Other | Admitting: Nurse Practitioner

## 2020-09-02 ENCOUNTER — Other Ambulatory Visit: Payer: Self-pay

## 2020-09-02 DIAGNOSIS — J0101 Acute recurrent maxillary sinusitis: Secondary | ICD-10-CM

## 2020-09-02 MED ORDER — BENZONATATE 100 MG PO CAPS: 100.0000 mg | ORAL_CAPSULE | Freq: Three times a day (TID) | ORAL | 0 refills | Status: DC | PRN

## 2020-09-02 MED ORDER — AMOXICILLIN-POT CLAVULANATE 875-125 MG PO TABS: 1.0000 | ORAL_TABLET | Freq: Two times a day (BID) | ORAL | 0 refills | Status: DC

## 2020-09-02 MED ORDER — FLUTICASONE PROPIONATE 50 MCG/ACT NA SUSP: 2.0000 | Freq: Every day | NASAL | 6 refills | Status: DC

## 2020-09-02 NOTE — Progress Notes (Signed)
Virtual Visit via telephone Note Due to COVID-19 pandemic this visit was conducted virtually. This visit type was conducted due to national recommendations for restrictions regarding the COVID-19 Pandemic (e.g. social distancing, sheltering in place) in an effort to limit this patient's exposure and mitigate transmission in our community. All issues noted in this document were discussed and addressed.  A physical exam was not performed with this format.  I connected with Lori Schroeder on 09/02/20 at 9:55 by telephone and verified that I am speaking with the correct person using two identifiers. Lori Schroeder is currently located at home and no one  is currently with her during visit. The provider, Mary-Margaret Hassell Done, FNP is located in their office at time of visit.  I discussed the limitations, risks, security and privacy concerns of performing an evaluation and management service by telephone and the availability of in person appointments. I also discussed with the patient that there may be a patient responsible charge related to this service. The patient expressed understanding and agreed to proceed.   History and Present Illness:   Chief Complaint: URI   HPI Patient has been sick for the last month. She has had 2 zpaks and steroids and is no better. She now has a ear aches and sore throat. Had a fever this morning of 101 she just feels worse. She is having cough, congestion, sore throat and now a ear ache. Has had to use her albuterol daily. She has had several covid test and were negative.   Review of Systems  Constitutional: Positive for chills and fever.  HENT: Positive for congestion, ear pain and sore throat. Negative for ear discharge.   Respiratory: Positive for cough (wet productive cough).   Neurological: Positive for sensory change and headaches. Negative for dizziness.  All other systems reviewed and are negative.    Observations/Objective: Alert and oriented- answers all  questions appropriately No distress Voice hoarse Wet cough noted  Assessment and Plan: Lori Schroeder in today with chief complaint of URI   1. Acute recurrent maxillary sinusitis 1. Take meds as prescribed 2. Use a cool mist humidifier especially during the winter months and when heat has been humid. 3. Use saline nose sprays frequently 4. Saline irrigations of the nose can be very helpful if done frequently.  * 4X daily for 1 week*  * Use of a nettie pot can be helpful with this. Follow directions with this* 5. Drink plenty of fluids 6. Keep thermostat turn down low 7.For any cough or congestion  Use plain Mucinex- regular strength or max strength is fine 8. For fever or aces or pains- take tylenol or ibuprofen appropriate for age and weight.  * for fevers greater than 101 orally you may alternate ibuprofen and tylenol every  3 hours.    - amoxicillin-clavulanate (AUGMENTIN) 875-125 MG tablet; Take 1 tablet by mouth 2 (two) times daily.  Dispense: 20 tablet; Refill: 0 - fluticasone (FLONASE) 50 MCG/ACT nasal spray; Place 2 sprays into both nostrils daily.  Dispense: 16 g; Refill: 6 - benzonatate (TESSALON PERLES) 100 MG capsule; Take 1 capsule (100 mg total) by mouth 3 (three) times daily as needed.  Dispense: 20 capsule; Refill: 0     Follow Up Instructions: prn    I discussed the assessment and treatment plan with the patient. The patient was provided an opportunity to ask questions and all were answered. The patient agreed with the plan and demonstrated an understanding of the instructions.   The patient  was advised to call back or seek an in-person evaluation if the symptoms worsen or if the condition fails to improve as anticipated.  The above assessment and management plan was discussed with the patient. The patient verbalized understanding of and has agreed to the management plan. Patient is aware to call the clinic if symptoms persist or worsen. Patient is aware when to  return to the clinic for a follow-up visit. Patient educated on when it is appropriate to go to the emergency department.   Time call ended:  10:10  I provided 15 minutes of non-face-to-face time during this encounter.    Mary-Margaret Hassell Done, FNP

## 2020-09-07 ENCOUNTER — Telehealth: Payer: Self-pay

## 2020-09-07 NOTE — Telephone Encounter (Signed)
Appointment scheduled.

## 2020-09-07 NOTE — Telephone Encounter (Signed)
Patient states that she thinks she has thrush in her mouth.  Mouth is white on the inside.  Thinks that it was from the Trelegy or Augmentin she was prescribed on 12/2. Please advise if mouth wash can be sent in- no openings this week.

## 2020-09-07 NOTE — Telephone Encounter (Signed)
Again, as per OFFICE POLICY, ALL new medications require a visit (by anyone).  I did not see pt for meds on 12/2 so this either needs to go to prescribing provider OR she needs to schedule OV.

## 2020-09-10 ENCOUNTER — Ambulatory Visit: Payer: Medicare Other | Admitting: Nurse Practitioner

## 2020-09-16 ENCOUNTER — Other Ambulatory Visit: Payer: Self-pay | Admitting: Family Medicine

## 2020-09-16 DIAGNOSIS — K219 Gastro-esophageal reflux disease without esophagitis: Secondary | ICD-10-CM

## 2020-09-16 DIAGNOSIS — J441 Chronic obstructive pulmonary disease with (acute) exacerbation: Secondary | ICD-10-CM

## 2020-09-16 DIAGNOSIS — J069 Acute upper respiratory infection, unspecified: Secondary | ICD-10-CM

## 2020-10-13 DIAGNOSIS — Z5181 Encounter for therapeutic drug level monitoring: Secondary | ICD-10-CM | POA: Diagnosis not present

## 2020-10-13 DIAGNOSIS — Z79899 Other long term (current) drug therapy: Secondary | ICD-10-CM | POA: Diagnosis not present

## 2020-10-13 DIAGNOSIS — M545 Low back pain, unspecified: Secondary | ICD-10-CM | POA: Diagnosis not present

## 2020-10-13 DIAGNOSIS — G8929 Other chronic pain: Secondary | ICD-10-CM | POA: Diagnosis not present

## 2020-10-13 DIAGNOSIS — G894 Chronic pain syndrome: Secondary | ICD-10-CM | POA: Diagnosis not present

## 2020-10-13 DIAGNOSIS — M961 Postlaminectomy syndrome, not elsewhere classified: Secondary | ICD-10-CM | POA: Diagnosis not present

## 2020-10-13 DIAGNOSIS — M47816 Spondylosis without myelopathy or radiculopathy, lumbar region: Secondary | ICD-10-CM | POA: Diagnosis not present

## 2020-11-02 ENCOUNTER — Encounter: Payer: Self-pay | Admitting: Nurse Practitioner

## 2020-11-02 ENCOUNTER — Ambulatory Visit (INDEPENDENT_AMBULATORY_CARE_PROVIDER_SITE_OTHER): Payer: Medicare HMO | Admitting: Nurse Practitioner

## 2020-11-02 DIAGNOSIS — J0101 Acute recurrent maxillary sinusitis: Secondary | ICD-10-CM | POA: Diagnosis not present

## 2020-11-02 DIAGNOSIS — J441 Chronic obstructive pulmonary disease with (acute) exacerbation: Secondary | ICD-10-CM

## 2020-11-02 DIAGNOSIS — F17200 Nicotine dependence, unspecified, uncomplicated: Secondary | ICD-10-CM

## 2020-11-02 MED ORDER — AMOXICILLIN-POT CLAVULANATE 875-125 MG PO TABS: 1.0000 | ORAL_TABLET | Freq: Two times a day (BID) | ORAL | 0 refills | Status: DC

## 2020-11-02 MED ORDER — PREDNISONE 20 MG PO TABS: ORAL_TABLET | ORAL | 0 refills | Status: DC

## 2020-11-02 NOTE — Progress Notes (Signed)
Virtual Visit via telephone Note Due to COVID-19 pandemic this visit was conducted virtually. This visit type was conducted due to national recommendations for restrictions regarding the COVID-19 Pandemic (e.g. social distancing, sheltering in place) in an effort to limit this patient's exposure and mitigate transmission in our community. All issues noted in this document were discussed and addressed.  A physical exam was not performed with this format.  I connected with Lori Schroeder on 11/02/20 at 10:05 by telephone and verified that I am speaking with the correct person using two identifiers. Lori Schroeder is currently located at home and no one is currently with  her during visit. The provider, Mary-Margaret Hassell Done, FNP is located in their office at time of visit.  I discussed the limitations, risks, security and privacy concerns of performing an evaluation and management service by telephone and the availability of in person appointments. I also discussed with the patient that there may be a patient responsible charge related to this service. The patient expressed understanding and agreed to proceed.   History and Present Illness:   Chief Complaint: URI   HPI Patient calls in c/o cough, congestion and bil ear pain. She has been having to use her albuterol inhaler. She denies covid and has not had vaccines. She has not been out of house and has been around no one.    Review of Systems  Constitutional: Positive for chills and fever (yesterday 100.2).  HENT: Positive for congestion, ear pain and sinus pain. Negative for sore throat.   Respiratory: Positive for cough, sputum production and shortness of breath.   Musculoskeletal: Negative for myalgias.  Neurological: Positive for headaches.  All other systems reviewed and are negative.    Observations/Objective: Alert and oriented- answers all questions appropriately No distress Voice hoarse Dry cough audible   Assessment and  Plan: Amiylah Anastos in today with chief complaint of URI   1. Acute recurrent maxillary sinusitis  2. COPD exacerbation ([place])  3. Current smoker 1. Take meds as prescribed 2. Use a cool mist humidifier especially during the winter months and when heat has been humid. 3. Use saline nose sprays frequently 4. Saline irrigations of the nose can be very helpful if done frequently.  * 4X daily for 1 week*  * Use of a nettie pot can be helpful with this. Follow directions with this* 5. Drink plenty of fluids 6. Keep thermostat turn down low 7.For any cough or congestion  Use plain Mucinex- regular strength or max strength is fine   * Children- consult with Pharmacist for dosing 8. For fever or aces or pains- take tylenol or ibuprofen appropriate for age and weight.  * for fevers greater than 101 orally you may alternate ibuprofen and tylenol every  3 hours.    Meds ordered this encounter  Medications  . amoxicillin-clavulanate (AUGMENTIN) 875-125 MG tablet    Sig: Take 1 tablet by mouth 2 (two) times daily.    Dispense:  20 tablet    Refill:  0    Order Specific Question:   Supervising Provider    Answer:   Caryl Pina A [id number]  . predniSONE (DELTASONE) 20 MG tablet    Sig: Take 3 tabs daily for 1 week, then 2 tabs for 1 week, then 1 tab for one week    Dispense:  42 tablet    Refill:  0    Order Specific Question:   Supervising Provider    Answer:   Caryl Pina A [id number]  Follow Up Instructions: prn    I discussed the assessment and treatment plan with the patient. The patient was provided an opportunity to ask questions and all were answered. The patient agreed with the plan and demonstrated an understanding of the instructions.   The patient was advised to call back or seek an in-person evaluation if the symptoms worsen or if the condition fails to improve as anticipated.  The above assessment and management plan was discussed with the patient.  The patient verbalized understanding of and has agreed to the management plan. Patient is aware to call the clinic if symptoms persist or worsen. Patient is aware when to return to the clinic for a follow-up visit. Patient educated on when it is appropriate to go to the emergency department.   Time call ended:  10:17  I provided 12 minutes of non-face-to-face time during this encounter.    Mary-Margaret Hassell Done, FNP

## 2020-11-10 ENCOUNTER — Telehealth: Payer: Self-pay

## 2020-11-10 NOTE — Telephone Encounter (Signed)
FYI: Received letter via fax from [facility] that states:  We are contacting you to inform you that the referred patient has cancelled 2 appointments with Marella Chimes and has not called back to reschedule.

## 2020-11-17 ENCOUNTER — Telehealth: Payer: Self-pay

## 2020-11-17 NOTE — Telephone Encounter (Signed)
Spoke with patient she states she still has blisters on tongue its not thrush the spray is not working at all. She can not get a deep breath it causing pain in back. Can prednisone cause blister is in mouth.

## 2020-11-17 NOTE — Telephone Encounter (Signed)
Appt made with [place] tomorrow

## 2020-11-17 NOTE — Telephone Encounter (Signed)
Pt called to let Dr Lajuana Ripple know that the spray that was prescribed to her recently, is not helping. Pt still SOB.  Please advise and call patient.

## 2020-11-17 NOTE — Telephone Encounter (Signed)
Pt called stating that MMM prescribed her some amoxicillin and prednisone last week which she has taken as directed, but says now she has blisters on her tongue. Pt says she has tried colgate peroxide mouth wash, warm salt warm, and tooth brush with toothpaste, and says none of those things are helping. Needs advise on what else she could or if something can be prescribed to her.  Please advise and call patient.

## 2020-11-17 NOTE — Telephone Encounter (Signed)
Not typically.  Recommend she be seen

## 2020-11-17 NOTE — Telephone Encounter (Signed)
Lmtcb and let us know of all what symptoms she is having.

## 2020-11-18 ENCOUNTER — Ambulatory Visit: Payer: Medicare HMO | Admitting: Family Medicine

## 2020-11-18 ENCOUNTER — Ambulatory Visit (INDEPENDENT_AMBULATORY_CARE_PROVIDER_SITE_OTHER): Payer: Medicare HMO

## 2020-11-18 ENCOUNTER — Encounter: Payer: Self-pay | Admitting: Family Medicine

## 2020-11-18 ENCOUNTER — Ambulatory Visit (INDEPENDENT_AMBULATORY_CARE_PROVIDER_SITE_OTHER): Payer: Medicare HMO | Admitting: Family Medicine

## 2020-11-18 VITALS — BP 124/73 | HR 97 | Temp 97.3°F

## 2020-11-18 DIAGNOSIS — J0110 Acute frontal sinusitis, unspecified: Secondary | ICD-10-CM | POA: Diagnosis not present

## 2020-11-18 DIAGNOSIS — R059 Cough, unspecified: Secondary | ICD-10-CM

## 2020-11-18 DIAGNOSIS — K146 Glossodynia: Secondary | ICD-10-CM | POA: Diagnosis not present

## 2020-11-18 MED ORDER — AZITHROMYCIN 250 MG PO TABS: ORAL_TABLET | ORAL | 0 refills | Status: DC

## 2020-11-18 MED ORDER — NYSTATIN 100000 UNIT/ML MT SUSP: 5.0000 mL | Freq: Three times a day (TID) | OROMUCOSAL | 0 refills | Status: DC

## 2020-11-18 NOTE — Progress Notes (Signed)
Assessment & Plan:  1. Acute non-recurrent frontal sinusitis Discussed symptom management.  - azithromycin (ZITHROMAX Z-PAK) 250 MG tablet; As directed  Dispense: 1 each; Refill: 0  2. Cough - CBC with Differential/Platelet - DG Chest 2 View; Future - Novel Coronavirus, NAA ([facility])  3. Tongue pain - Vitamin B12 - magic mouthwash (nystatin, lidocaine, diphenhydrAMINE, alum & mag hydroxide) suspension; Swish and spit 5 mLs 3 (three) times daily.  Dispense: 180 mL; Refill: 0   Follow up plan: Return if symptoms worsen or fail to improve.  Hendricks Limes, MSN, APRN, FNP-C [facility]  Subjective:   Patient ID: Lori Schroeder, female    DOB: [birth year], 65 y.o.   MRN: [id number]  HPI: Lori Schroeder is a 65 y.o. female presenting on 11/18/2020 for blisters on tounge  (X 3 days/), Cough (Patient seen MMM on 2/1 and got abx prednisone. Patient states she got better and got sick again x 2 days ago.  ), and Nasal Congestion  Patient was treated with antibiotic and steroid 2-1/2 weeks ago.  Patient states she got better, but then got sick again 2 days ago.  She reports pain that goes through to her back, pain with deep breaths, productive cough with yellow/green sputum, increasing shortness of breath and wheezing, ear pain/pressure, facial pressure above her eyes, and a fever.  Cough is not more or thicker than usual, just a different color.  Max temp 100.2.  Patient denies sore throat, nausea, vomiting, diarrhea, loss of taste, and loss of smell.  She has been using her albuterol and Trelegy inhalers.  She reports her tongue has blisters on it, but does not feel this is thrush that she normally gets with her inhaler.   ROS: Negative unless specifically indicated above in HPI.   Relevant past medical history reviewed and updated as indicated.   Allergies and medications reviewed and updated.   Current Outpatient Medications:  .  albuterol (VENTOLIN HFA) 108 (90 Base)  MCG/ACT inhaler, 2 PUFFS EVERY 6 HOURS AS NEEDED FOR WHEEZING OR SHORTNESS OF BREATH, Disp: 8.5 g, Rfl: 2 .  amoxicillin-clavulanate (AUGMENTIN) 875-125 MG tablet, Take 1 tablet by mouth 2 (two) times daily., Disp: 20 tablet, Rfl: 0 .  atorvastatin (LIPITOR) 10 MG tablet, TAKE 1 TABLET EVERY DAY, Disp: 90 tablet, Rfl: 1 .  diclofenac sodium (VOLTAREN) 1 % GEL, Apply 4 g topically 4 (four) times daily., Disp: 400 g, Rfl: 3 .  esomeprazole (NEXIUM) 40 MG capsule, TAKE (1) CAPSULE TWICE DAILY., Disp: 60 capsule, Rfl: 5 .  fluticasone (FLONASE) 50 MCG/ACT nasal spray, Place 2 sprays into both nostrils daily., Disp: 16 g, Rfl: 6 .  Fluticasone-Umeclidin-Vilant (TRELEGY ELLIPTA) 100-62.5-25 MCG/INH AEPB, Inhale 1 puff into the lungs daily., Disp: 2 each, Rfl: 0 .  naloxone (NARCAN) nasal spray 4 mg/0.1 mL, Place 1 spray into the nose once., Disp: , Rfl:  .  pregabalin (LYRICA) 300 MG capsule, Take 300 mg by mouth daily. , Disp: , Rfl:  .  pregabalin (LYRICA) 50 MG capsule, Take 50 mg by mouth daily., Disp: , Rfl:  .  tiZANidine (ZANAFLEX) 4 MG tablet, TAKE ONE TABLET BY MOUTH THREE TIMES DAILY AS NEEDED FOR MUSCLE SPASM, Disp: , Rfl:  .  TRULICITY 1.5 WU/9.8JX SOPN, Inject 1.5 mg into the skin once a week., Disp: 2 mL, Rfl: 4 .  venlafaxine XR (EFFEXOR-XR) 75 MG 24 hr capsule, venlafaxine ER 75 mg capsule,extended release 24 hr, Disp: , Rfl:  .  doxycycline (  VIBRA-TABS) 100 MG tablet, Take 1 tablet (100 mg total) by mouth 2 (two) times daily., Disp: 20 tablet, Rfl: 0 .  predniSONE (DELTASONE) 20 MG tablet, Take 3 tabs daily for 1 week, then 2 tabs for 1 week, then 1 tab for one week (Patient not taking: Reported on 11/18/2020), Disp: 42 tablet, Rfl: 0  No Known Allergies  Objective:   BP 124/73   Pulse 97   Temp (!) 97.3 F (36.3 C) (Temporal)   SpO2 98%    Physical Exam Vitals reviewed.  Constitutional:      General: She is not in acute distress.    Appearance: Normal appearance. She is not  ill-appearing, toxic-appearing or diaphoretic.  HENT:     Head: Normocephalic and atraumatic.     Right Ear: Tympanic membrane, ear canal and external ear normal. There is no impacted cerumen.     Left Ear: Tympanic membrane, ear canal and external ear normal. There is no impacted cerumen.     Nose: Rhinorrhea (clear) present. No congestion.     Mouth/Throat:     Mouth: Mucous membranes are moist.     Pharynx: Posterior oropharyngeal erythema present.     Comments: Tongue red and inflamed. Eyes:     General: No scleral icterus.       Right eye: No discharge.        Left eye: No discharge.     Conjunctiva/sclera: Conjunctivae normal.  Cardiovascular:     Rate and Rhythm: Normal rate and regular rhythm.     Heart sounds: Normal heart sounds. No murmur heard. No friction rub. No gallop.   Pulmonary:     Effort: Pulmonary effort is normal. No respiratory distress.     Breath sounds: Normal breath sounds. No stridor. No wheezing, rhonchi or rales.  Musculoskeletal:        General: Normal range of motion.     Cervical back: Normal range of motion.  Lymphadenopathy:     Cervical: Cervical adenopathy present.  Skin:    General: Skin is warm and dry.     Capillary Refill: Capillary refill takes less than 2 seconds.  Neurological:     General: No focal deficit present.     Mental Status: She is alert and oriented to person, place, and time. Mental status is at baseline.  Psychiatric:        Mood and Affect: Mood normal.        Behavior: Behavior normal.        Thought Content: Thought content normal.        Judgment: Judgment normal.

## 2020-11-19 LAB — CBC WITH DIFFERENTIAL/PLATELET
Basophils Absolute: 0.1 10*3/uL (ref 0.0–0.2)
Basos: 1 %
EOS (ABSOLUTE): 0.2 10*3/uL (ref 0.0–0.4)
Eos: 2 %
Hematocrit: 39.6 % (ref 34.0–46.6)
Hemoglobin: 13.4 g/dL (ref 11.1–15.9)
Immature Grans (Abs): 0 10*3/uL (ref 0.0–0.1)
Immature Granulocytes: 0 %
Lymphocytes Absolute: 2.9 10*3/uL (ref 0.7–3.1)
Lymphs: 27 %
MCH: 29.8 pg (ref 26.6–33.0)
MCHC: 33.8 g/dL (ref 31.5–35.7)
MCV: 88 fL (ref 79–97)
Monocytes Absolute: 0.8 10*3/uL (ref 0.1–0.9)
Monocytes: 7 %
Neutrophils Absolute: 6.9 10*3/uL (ref 1.4–7.0)
Neutrophils: 63 %
Platelets: 369 10*3/uL (ref 150–450)
RBC: 4.5 x10E6/uL (ref 3.77–5.28)
RDW: 14.8 % (ref 11.7–15.4)
WBC: 10.8 10*3/uL (ref 3.4–10.8)

## 2020-11-19 LAB — VITAMIN B12: Vitamin B-12: 409 pg/mL (ref 232–1245)

## 2020-11-19 LAB — NOVEL CORONAVIRUS, NAA: SARS-CoV-2, NAA: NOT DETECTED

## 2020-11-19 LAB — SARS-COV-2, NAA 2 DAY TAT

## 2020-11-30 ENCOUNTER — Other Ambulatory Visit: Payer: Self-pay

## 2020-11-30 ENCOUNTER — Ambulatory Visit (INDEPENDENT_AMBULATORY_CARE_PROVIDER_SITE_OTHER): Payer: Medicare HMO | Admitting: Family Medicine

## 2020-11-30 ENCOUNTER — Telehealth: Payer: Self-pay

## 2020-11-30 VITALS — BP 125/64 | HR 86 | Temp 97.4°F | Ht 59.0 in | Wt 120.0 lb

## 2020-11-30 DIAGNOSIS — I152 Hypertension secondary to endocrine disorders: Secondary | ICD-10-CM

## 2020-11-30 DIAGNOSIS — E1159 Type 2 diabetes mellitus with other circulatory complications: Secondary | ICD-10-CM | POA: Diagnosis not present

## 2020-11-30 DIAGNOSIS — M2550 Pain in unspecified joint: Secondary | ICD-10-CM | POA: Diagnosis not present

## 2020-11-30 DIAGNOSIS — F17200 Nicotine dependence, unspecified, uncomplicated: Secondary | ICD-10-CM | POA: Diagnosis not present

## 2020-11-30 DIAGNOSIS — J410 Simple chronic bronchitis: Secondary | ICD-10-CM

## 2020-11-30 DIAGNOSIS — E1169 Type 2 diabetes mellitus with other specified complication: Secondary | ICD-10-CM | POA: Diagnosis not present

## 2020-11-30 DIAGNOSIS — R7982 Elevated C-reactive protein (CRP): Secondary | ICD-10-CM

## 2020-11-30 LAB — BASIC METABOLIC PANEL
BUN/Creatinine Ratio: 10 — ABNORMAL LOW (ref 12–28)
BUN: 6 mg/dL — ABNORMAL LOW (ref 8–27)
CO2: 21 mmol/L (ref 20–29)
Calcium: 9.8 mg/dL (ref 8.7–10.3)
Chloride: 102 mmol/L (ref 96–106)
Creatinine, Ser: 0.61 mg/dL (ref 0.57–1.00)
Glucose: 96 mg/dL (ref 65–99)
Potassium: 4.4 mmol/L (ref 3.5–5.2)
Sodium: 141 mmol/L (ref 134–144)
eGFR: 100 mL/min/{1.73_m2} (ref >59)

## 2020-11-30 LAB — BAYER DCA HB A1C WAIVED: HB A1C (BAYER DCA - WAIVED): 6.8 % (ref <7.0)

## 2020-11-30 MED ORDER — BREZTRI AEROSPHERE 160-9-4.8 MCG/ACT IN AERO: 2.0000 | INHALATION_SPRAY | Freq: Two times a day (BID) | RESPIRATORY_TRACT | 0 refills | Status: DC

## 2020-11-30 NOTE — Telephone Encounter (Signed)
Pt called to let Dr Lajuana Ripple know that when she places the referral for her to see a foot specialist, she does not want to see Dr Irving Shows.

## 2020-11-30 NOTE — Patient Instructions (Signed)
Replace Trelegy with [name].  Referral to the lung specialist, foot doctor and joint doctor placed   Managing the Challenge of Quitting Smoking Quitting smoking is a physical and mental challenge. You will face cravings, withdrawal symptoms, and temptation. Before quitting, work with your health care provider to make a plan that can help you manage quitting. Preparation can help you quit and keep you from giving in. How to manage lifestyle changes Managing stress Stress can make you want to smoke, and wanting to smoke may cause stress. It is important to find ways to manage your stress. You might try some of the following:  Practice relaxation techniques. ? Breathe slowly and deeply, in through your nose and out through your mouth. ? Listen to music. ? Soak in a bath or take a shower. ? Imagine a peaceful place or vacation.  Get some support. ? Talk with family or friends about your stress. ? Join a support group. ? Talk with a counselor or therapist.  Get some physical activity. ? Go for a walk, run, or bike ride. ? Play a favorite sport. ? Practice yoga.   Medicines Talk with your health care provider about medicines that might help you deal with cravings and make quitting easier for you. Relationships Social situations can be difficult when you are quitting smoking. To manage this, you can:  Avoid parties and other social situations where people might be smoking.  Avoid alcohol.  Leave right away if you have the urge to smoke.  Explain to your family and friends that you are quitting smoking. Ask for support and let them know you might be a bit grumpy.  Plan activities where smoking is not an option. General instructions Be aware that many people gain weight after they quit smoking. However, not everyone does. To keep from gaining weight, have a plan in place before you quit and stick to the plan after you quit. Your plan should include:  Having healthy snacks. When you  have a craving, it may help to: ? Eat popcorn, carrots, celery, or other cut vegetables. ? Chew sugar-free gum.  Changing how you eat. ? Eat small portion sizes at meals. ? Eat 4-6 small meals throughout the day instead of 1-2 large meals a day. ? Be mindful when you eat. Do not watch television or do other things that might distract you as you eat.  Exercising regularly. ? Make time to exercise each day. If you do not have time for a long workout, do short bouts of exercise for 5-10 minutes several times a day. ? Do some form of strengthening exercise, such as weight lifting. ? Do some exercise that gets your heart beating and causes you to breathe deeply, such as walking fast, running, swimming, or biking. This is very important.  Drinking plenty of water or other low-calorie or no-calorie drinks. Drink 6-8 glasses of water daily.   How to recognize withdrawal symptoms Your body and mind may experience discomfort as you try to get used to not having nicotine in your system. These effects are called withdrawal symptoms. They may include:  Feeling hungrier than normal.  Having trouble concentrating.  Feeling irritable or restless.  Having trouble sleeping.  Feeling depressed.  Craving a cigarette. To manage withdrawal symptoms:  Avoid places, people, and activities that trigger your cravings.  Remember why you want to quit.  Get plenty of sleep.  Avoid coffee and other caffeinated drinks. These may worsen some of your symptoms. These symptoms may  surprise you. But be assured that they are normal to have when quitting smoking. How to manage cravings Come up with a plan for how to deal with your cravings. The plan should include the following:  A definition of the specific situation you want to deal with.  An alternative action you will take.  A clear idea for how this action will help.  The name of someone who might help you with this. Cravings usually last for 5-10  minutes. Consider taking the following actions to help you with your plan to deal with cravings:  Keep your mouth busy. ? Chew sugar-free gum. ? Suck on hard candies or a straw. ? Brush your teeth.  Keep your hands and body busy. ? Change to a different activity right away. ? Squeeze or play with a ball. ? Do an activity or a hobby, such as making bead jewelry, practicing needlepoint, or working with wood. ? Mix up your normal routine. ? Take a short exercise break. Go for a quick walk or run up and down stairs.  Focus on doing something kind or helpful for someone else.  Call a friend or family member to talk during a craving.  Join a support group.  Contact a quitline. Where to find support To get help or find a support group:  Call the [employer] [facility]'s Smoking Quitline: 1-800-QUIT NOW 405-379-6902)  Visit the website of the Substance Abuse and [place]: ktimeonline.com  Text QUIT to SmokefreeTXT: 168372 Where to find more information Visit these websites to find more information on quitting smoking:  [place]: www.smokefree.gov  American Lung Association: www.lung.org  American Cancer Society: www.cancer.org  Centers for Disease Control and Prevention: http://www.wolf.info/  American Heart Association: www.heart.org Contact a health care provider if:  You want to change your plan for quitting.  The medicines you are taking are not helping.  Your eating feels out of control or you cannot sleep. Get help right away if:  You feel depressed or become very anxious. Summary  Quitting smoking is a physical and mental challenge. You will face cravings, withdrawal symptoms, and temptation to smoke again. Preparation can help you as you go through these challenges.  Try different techniques to manage stress, handle social situations, and prevent weight gain.  You can deal with cravings by keeping your mouth busy (such as by  chewing gum), keeping your hands and body busy, calling family or friends, or contacting a quitline for people who want to quit smoking.  You can deal with withdrawal symptoms by avoiding places where people smoke, getting plenty of rest, and avoiding drinks with caffeine. This information is not intended to replace advice given to you by your health care provider. Make sure you discuss any questions you have with your health care provider. Document Revised: 07/08/2019 Document Reviewed: 07/08/2019 Elsevier Patient Education  [place].

## 2020-11-30 NOTE — Telephone Encounter (Signed)
No problem. Referral to TFA placed in [place]

## 2020-11-30 NOTE — Progress Notes (Signed)
Subjective: CC: DM PCP: [name], DO WNU:[id number] Goin is a 65 y.o. female presenting to clinic today for:  1. Type 2 Diabetes with hypertension, hyperlipidemia:  Patient notes that she has been doing fairly well.  She is compliant with her Lipitor, Lyrica and Trulicity.  No reports of hypoglycemic episodes.  Last eye exam: Up-to-date Last foot exam: Up-to-date Last A1c:  Lab Results  Component Value Date   HGBA1C 6.8 06/01/2020   Nephropathy screen indicated?:  Needs Last flu, zoster and/or pneumovax:  Immunization History  Administered Date(s) Administered  . Influenza-Unspecified 10/02/2018  . Pneumococcal Conjugate-13 05/29/2018  . Tdap 05/29/2018    2.  Polyarthralgia Patient is asking for a new referral to rheumatology.  She has history of elevated CRP.  3.  Chronic bronchitis Patient with ongoing chronic bronchitis that is refractory to use of Trelegy and albuterol.  Asking for referral today.  She is an active every day smoker.  No hemoptysis, unplanned weight loss.  ROS: Per HPI  No Known Allergies Past Medical History:  Diagnosis Date  . Blood transfusion without reported diagnosis   . External hemorrhoids without mention of complication [id number]   Colonoscopy  . Family history of malignant neoplasm of gastrointestinal tract   . Family history of malignant neoplasm of gastrointestinal tract   . GERD (gastroesophageal reflux disease)   . Hyperlipemia   . Hypertension     Current Outpatient Medications:  .  albuterol (VENTOLIN HFA) 108 (90 Base) MCG/ACT inhaler, 2 PUFFS EVERY 6 HOURS AS NEEDED FOR WHEEZING OR SHORTNESS OF BREATH, Disp: 8.5 g, Rfl: 2 .  atorvastatin (LIPITOR) 10 MG tablet, TAKE 1 TABLET EVERY DAY, Disp: 90 tablet, Rfl: 1 .  azithromycin (ZITHROMAX Z-PAK) 250 MG tablet, As directed, Disp: 1 each, Rfl: 0 .  diclofenac sodium (VOLTAREN) 1 % GEL, Apply 4 g topically 4 (four) times daily., Disp: 400 g, Rfl: 3 .  esomeprazole (NEXIUM)  40 MG capsule, TAKE (1) CAPSULE TWICE DAILY., Disp: 60 capsule, Rfl: 5 .  fluticasone (FLONASE) 50 MCG/ACT nasal spray, Place 2 sprays into both nostrils daily., Disp: 16 g, Rfl: 6 .  Fluticasone-Umeclidin-Vilant (TRELEGY ELLIPTA) 100-62.5-25 MCG/INH AEPB, Inhale 1 puff into the lungs daily., Disp: 2 each, Rfl: 0 .  magic mouthwash (nystatin, lidocaine, diphenhydrAMINE, alum & mag hydroxide) suspension, Swish and spit 5 mLs 3 (three) times daily., Disp: 180 mL, Rfl: 0 .  naloxone (NARCAN) nasal spray 4 mg/0.1 mL, Place 1 spray into the nose once., Disp: , Rfl:  .  pregabalin (LYRICA) 300 MG capsule, Take 300 mg by mouth daily. , Disp: , Rfl:  .  pregabalin (LYRICA) 50 MG capsule, Take 50 mg by mouth daily., Disp: , Rfl:  .  tiZANidine (ZANAFLEX) 4 MG tablet, TAKE ONE TABLET BY MOUTH THREE TIMES DAILY AS NEEDED FOR MUSCLE SPASM, Disp: , Rfl:  .  TRULICITY 1.5 VQ/2.5ZD SOPN, Inject 1.5 mg into the skin once a week., Disp: 2 mL, Rfl: 4 .  venlafaxine XR (EFFEXOR-XR) 75 MG 24 hr capsule, venlafaxine ER 75 mg capsule,extended release 24 hr, Disp: , Rfl:  Social History   Socioeconomic History  . Marital status: Married    Spouse name: Not on file  . Number of children: 2  . Years of education: 23  . Highest education level: Associate degree: occupational, Hotel manager, or vocational program  Occupational History  . Occupation: disability    Employer: DISABLED  Tobacco Use  . Smoking status: Current Every Day Smoker  Packs/day: 0.50    Years: 10.00    Pack years: 5.00    Types: Cigarettes  . Smokeless tobacco: Never Used  Vaping Use  . Vaping Use: Never used  Substance and Sexual Activity  . Alcohol use: No  . Drug use: No  . Sexual activity: Not on file  Other Topics Concern  . Not on file  Social History Narrative  . Not on file   Social Determinants of Health   Financial Resource Strain: Not on file  Food Insecurity: Not on file  Transportation Needs: Not on file  Physical  Activity: Not on file  Stress: Not on file  Social Connections: Not on file  Intimate Partner Violence: Not on file   Family History  Problem Relation Age of Onset  . Colon cancer Maternal Grandmother   . Alzheimer's disease Mother   . Hyperlipidemia Mother   . Hypertension Mother   . Cancer Father   . Alcohol abuse Father   . Diabetes Daughter        Type 1  . Breast cancer Neg Hx   . Celiac disease Neg Hx   . Cirrhosis Neg Hx   . Clotting disorder Neg Hx   . Colitis Neg Hx   . Colon polyps Neg Hx   . Crohn's disease Neg Hx   . Cystic fibrosis Neg Hx   . Esophageal cancer Neg Hx   . Heart disease Neg Hx   . Hemochromatosis Neg Hx   . Inflammatory bowel disease Neg Hx   . Irritable bowel syndrome Neg Hx   . Kidney disease Neg Hx   . Liver cancer Neg Hx   . Liver disease Neg Hx   . Ovarian cancer Neg Hx   . Pancreatic cancer Neg Hx   . Prostate cancer Neg Hx   . Rectal cancer Neg Hx   . Stomach cancer Neg Hx   . Ulcerative colitis Neg Hx   . Uterine cancer Neg Hx   . Wilson's disease Neg Hx     Objective: Office vital signs reviewed. BP 125/64   Pulse 86   Temp (!) 97.4 F (36.3 C) (Temporal)   Ht 4\' 11"  (1.499 m)   Wt 120 lb (54.4 kg)   SpO2 97%   BMI 24.24 kg/m   Physical Examination:  General: Awake, alert, No acute distress HEENT: Normal; sclera white Cardio: regular rate and rhythm, S1S2 heard, no murmurs appreciated Pulm: Expiratory wheezes noted on exam.  Decreased breath sounds.  Normal work of breathing on room air Extremities: warm, well perfused, No edema, cyanosis or clubbing; +2 pulses bilaterally MSK: Arthritic changes noted throughout bilateral hands  Diabetic Foot Exam - Simple   Simple Foot Form Diabetic Foot exam was performed with the following findings: Yes 11/30/2020  5:25 PM  Visual Inspection No deformities, no ulcerations, no other skin breakdown bilaterally: Yes Sensation Testing Intact to touch and monofilament testing  bilaterally: Yes Pulse Check Posterior Tibialis and Dorsalis pulse intact bilaterally: Yes Comments      Assessment/ Plan: 66 y.o. female   Controlled type 2 diabetes mellitus with other specified complication, without long-term current use of insulin ([place]) - Plan: Bayer DCA Hb A1c Waived, Microalbumin / creatinine urine ratio  Hypertension associated with diabetes ([place]) - Plan: Basic Metabolic Panel  Simple chronic bronchitis ([place]) - Plan: Ambulatory referral to Pulmonology  Polyarthralgia - Plan: Ambulatory referral to Rheumatology  Elevated C-reactive protein (CRP) - Plan: Ambulatory referral to Rheumatology  Tobacco use  disorder - Plan: Ambulatory referral to Pulmonology  Blood sugars well controlled.  Check urine microalbumin  Blood pressure well controlled.  Continue current regimen  New referral placed to pulmonology given ongoing bronchitis that is refractory to current Trelegy inhaler.  Sample of Breztri given and new Rx sent to pharmacy.  Patient is an active smoker and smoking cessation counseling performed today  New referral placed to rheumatology for polyarthralgia and elevated CRP  Orders Placed This Encounter  Procedures  . Bayer DCA Hb A1c Waived  . Basic Metabolic Panel  . Microalbumin / creatinine urine ratio   Meds ordered this encounter  Medications  . DISCONTD: Budeson-Glycopyrrol-Formoterol (BREZTRI AEROSPHERE) 160-9-4.8 MCG/ACT AERO    Sig: Inhale 2 puffs into the lungs in the morning and at bedtime.    Dispense:  5.9 g    Refill:  0  . Budeson-Glycopyrrol-Formoterol (BREZTRI AEROSPHERE) 160-9-4.8 MCG/ACT AERO    Sig: Inhale 2 puffs into the lungs in the morning and at bedtime.    Dispense:  10.7 g    Refill:  0     Iyona Pehrson Windell Moulding, DO [place] [phone]

## 2020-12-08 DIAGNOSIS — M961 Postlaminectomy syndrome, not elsewhere classified: Secondary | ICD-10-CM | POA: Diagnosis not present

## 2020-12-08 DIAGNOSIS — G8929 Other chronic pain: Secondary | ICD-10-CM | POA: Diagnosis not present

## 2020-12-08 DIAGNOSIS — G894 Chronic pain syndrome: Secondary | ICD-10-CM | POA: Diagnosis not present

## 2020-12-08 DIAGNOSIS — M545 Low back pain, unspecified: Secondary | ICD-10-CM | POA: Diagnosis not present

## 2020-12-08 DIAGNOSIS — M47816 Spondylosis without myelopathy or radiculopathy, lumbar region: Secondary | ICD-10-CM | POA: Diagnosis not present

## 2020-12-14 ENCOUNTER — Telehealth: Payer: Self-pay

## 2020-12-14 NOTE — Telephone Encounter (Signed)
Letter printed and up front for pick up, pt aware

## 2020-12-14 NOTE — Telephone Encounter (Signed)
Ok to provide.  I reviewed her last Rheumatology note and pain has been worse lately.  She is on sedating medications which would likely impair judgement.  Per her Rheum provider: "The pain is ...  worse with walking, sitting, standing, laying for long periods."

## 2021-01-03 ENCOUNTER — Institutional Professional Consult (permissible substitution): Payer: Medicare Other | Admitting: Internal Medicine

## 2021-01-06 ENCOUNTER — Telehealth: Payer: Self-pay

## 2021-01-06 NOTE — Telephone Encounter (Signed)
She asked for rheumatology.  That was placed.  If she needs podiatry, please ask why so I can place referral appropriately

## 2021-01-07 ENCOUNTER — Institutional Professional Consult (permissible substitution): Payer: Medicare Other | Admitting: Internal Medicine

## 2021-01-07 ENCOUNTER — Other Ambulatory Visit: Payer: Self-pay | Admitting: Family Medicine

## 2021-01-07 DIAGNOSIS — M79673 Pain in unspecified foot: Secondary | ICD-10-CM

## 2021-01-07 DIAGNOSIS — E1169 Type 2 diabetes mellitus with other specified complication: Secondary | ICD-10-CM

## 2021-01-07 NOTE — Telephone Encounter (Signed)
Ingrown toe nails toes are growing downward  Toes are sore when wearing shoes She's a diabetic Pt DOES NOT want to see Dr Drake--but if her insurance ONLY allows her to see Dr Irving Shows she is willing to see him.

## 2021-01-07 NOTE — Telephone Encounter (Signed)
Referral already placed.

## 2021-01-07 NOTE — Telephone Encounter (Signed)
Referral placed.

## 2021-01-10 DIAGNOSIS — M961 Postlaminectomy syndrome, not elsewhere classified: Secondary | ICD-10-CM | POA: Diagnosis not present

## 2021-01-10 DIAGNOSIS — G894 Chronic pain syndrome: Secondary | ICD-10-CM | POA: Diagnosis not present

## 2021-01-10 DIAGNOSIS — M545 Low back pain, unspecified: Secondary | ICD-10-CM | POA: Diagnosis not present

## 2021-01-10 DIAGNOSIS — G8929 Other chronic pain: Secondary | ICD-10-CM | POA: Diagnosis not present

## 2021-01-10 DIAGNOSIS — M47816 Spondylosis without myelopathy or radiculopathy, lumbar region: Secondary | ICD-10-CM | POA: Diagnosis not present

## 2021-01-10 DIAGNOSIS — Z79899 Other long term (current) drug therapy: Secondary | ICD-10-CM | POA: Diagnosis not present

## 2021-01-10 DIAGNOSIS — Z5181 Encounter for therapeutic drug level monitoring: Secondary | ICD-10-CM | POA: Diagnosis not present

## 2021-01-18 ENCOUNTER — Other Ambulatory Visit: Payer: Self-pay | Admitting: Family Medicine

## 2021-01-18 DIAGNOSIS — E785 Hyperlipidemia, unspecified: Secondary | ICD-10-CM

## 2021-01-18 DIAGNOSIS — E1169 Type 2 diabetes mellitus with other specified complication: Secondary | ICD-10-CM

## 2021-01-19 NOTE — Progress Notes (Signed)
Office Visit Note  Patient: Lori Schroeder             Date of Birth: 1956/03/29           MRN: [id number]             PCP: [name], DO Referring: [name], DO Visit Date: 02/02/2021 Occupation: @GUAROCC @  Subjective:  New Patient (Initial Visit) (Patient complains of bilateral hand pain, stiffness, and swelling. Patient notices weakness and loss of grip in both hands. )   History of Present Illness: Lori Schroeder is a 65 y.o. female seen in consultation per request of her PCP.  According the patient her symptoms a started about 2 years ago with pain in her hands.  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  She has been gardening and also canning on a regular basis.  She states in the last 10 months she has been having increased pain and discomfort in her hands with decreased grip strength.  She states the pain moved to her forearms.  She has difficulty making a fist.  She also has history of degenerative disease of cervical spine and had fusion of cervical spine in 2017.  She does not have any neck pain now.  She has chronic lower back pain.  She had lumbar spine fusion x3 by Dr. Maxie Better.  She continues to have bilateral lower extremity radiculopathy and residual numbness in her left lower extremity.  None of the other joints are painful.  There is no history of rash.  There is history of osteoarthritis in her mother and maternal grandmother.  She is gravida 2, para 2.  Activities of Daily Living:  Patient reports morning stiffness for 24 hours.   Patient Reports nocturnal pain.  Difficulty dressing/grooming: Reports Difficulty climbing stairs: Reports Difficulty getting out of chair: Reports Difficulty using hands for taps, buttons, cutlery, and/or writing: Reports  Review of Systems  Constitutional: Negative for fatigue, night sweats, weight gain and weight loss.  HENT: Negative for mouth sores, trouble swallowing,  trouble swallowing, mouth dryness and nose dryness.   Eyes: Negative for pain, redness, itching, visual disturbance and dryness.  Respiratory: Positive for shortness of breath. Negative for cough, hemoptysis and difficulty breathing.   Cardiovascular: Negative for chest pain, palpitations, hypertension, irregular heartbeat and swelling in legs/feet.  Gastrointestinal: Negative for abdominal pain, blood in stool, constipation and diarrhea.  Endocrine: Negative for increased urination.  Genitourinary: Negative for painful urination and vaginal dryness.  Musculoskeletal: Positive for arthralgias, joint pain, joint swelling, muscle weakness and morning stiffness. Negative for myalgias, muscle tenderness and myalgias.  Skin: Negative for color change, rash, hair loss, redness, skin tightness, ulcers and sensitivity to sunlight.  Allergic/Immunologic: Negative for susceptible to infections.  Neurological: Negative for dizziness, numbness, headaches, memory loss, night sweats and weakness.  Hematological: Negative for swollen glands.  Psychiatric/Behavioral: Negative for depressed mood, confusion and sleep disturbance. The patient is not nervous/anxious.     PMFS History:  Patient Active Problem List   Diagnosis Date Noted  . Osteoporosis 02/01/2021  . Type 2 diabetes mellitus with hyperglycemia ([place]) 01/08/2018  . CTS (carpal tunnel syndrome) 01/05/2017  . Tobacco use disorder 09/15/2016  . Chronic back pain 02/01/2016  . Allergic rhinitis due to pollen 02/01/2016  . BMI 26.0-26.9,adult 10/29/2015  . Hyperlipidemia associated with type 2 diabetes mellitus ([place]) 10/14/2010  . Hypertension associated with diabetes ([place]) 10/14/2010  . GERD 10/19/2007  Past Medical History:  Diagnosis Date  . Blood transfusion without reported diagnosis   . External hemorrhoids without mention of complication [id number]   Colonoscopy  . Family history of malignant neoplasm of gastrointestinal tract   .  Family history of malignant neoplasm of gastrointestinal tract   . GERD (gastroesophageal reflux disease)   . Hyperlipemia   . Hypertension     Family History  Problem Relation Age of Onset  . Colon cancer Maternal Grandmother   . Arthritis Maternal Grandmother   . Alzheimer's disease Mother   . Hyperlipidemia Mother   . Hypertension Mother   . Cancer Father   . Alcohol abuse Father   . Diabetes Daughter        Type 1  . Diabetes Son   . Breast cancer Neg Hx   . Celiac disease Neg Hx   . Cirrhosis Neg Hx   . Clotting disorder Neg Hx   . Colitis Neg Hx   . Colon polyps Neg Hx   . Crohn's disease Neg Hx   . Cystic fibrosis Neg Hx   . Esophageal cancer Neg Hx   . Heart disease Neg Hx   . Hemochromatosis Neg Hx   . Inflammatory bowel disease Neg Hx   . Irritable bowel syndrome Neg Hx   . Kidney disease Neg Hx   . Liver cancer Neg Hx   . Liver disease Neg Hx   . Ovarian cancer Neg Hx   . Pancreatic cancer Neg Hx   . Prostate cancer Neg Hx   . Rectal cancer Neg Hx   . Stomach cancer Neg Hx   . Ulcerative colitis Neg Hx   . Uterine cancer Neg Hx   . Wilson's disease Neg Hx    Past Surgical History:  Procedure Laterality Date  . ABDOMINAL HYSTERECTOMY    . BACK SURGERY    . BLADDER SURGERY     Bladder Tact  . CARPAL TUNNEL RELEASE Bilateral   . COLONOSCOPY    . FINGER SURGERY Right    Small finger fracture surgery  . NECK SURGERY     Social History   Social History Narrative  . Not on file   Immunization History  Administered Date(s) Administered  . Influenza-Unspecified 10/02/2018  . Pneumococcal Conjugate-13 05/29/2018  . Tdap 05/29/2018     Objective: Vital Signs: BP 121/76 (BP Location: Right Arm, Patient Position: Sitting, Cuff Size: Normal)   Pulse 82   Ht 4' 9.5" (1.461 m)   Wt 120 lb 12.8 oz (54.8 kg)   BMI 25.69 kg/m    Physical Exam Vitals and nursing note reviewed.  Constitutional:      Appearance: She is well-developed.  HENT:      Head: Normocephalic and atraumatic.  Eyes:     Conjunctiva/sclera: Conjunctivae normal.  Cardiovascular:     Rate and Rhythm: Normal rate and regular rhythm.     Heart sounds: Normal heart sounds.  Pulmonary:     Effort: Pulmonary effort is normal.     Breath sounds: Normal breath sounds.  Abdominal:     General: Bowel sounds are normal.     Palpations: Abdomen is soft.  Musculoskeletal:     Cervical back: Normal range of motion.  Lymphadenopathy:     Cervical: No cervical adenopathy.  Skin:    General: Skin is warm and dry.     Capillary Refill: Capillary refill takes less than 2 seconds.  Neurological:     Mental Status: She is alert  and oriented to person, place, and time.  Psychiatric:        Behavior: Behavior normal.      Musculoskeletal Exam: C-spine was in good range of motion.  Shoulder joints by good range of motion with some discomfort.  Elbow joints were in good range of motion.  She had tenderness over bilateral wrist joints.  She has synovitis in multiple MCPs and PIP joints as described below.  She has good range of motion of her hip joints and knee joints.  There was no tenderness over ankles or MTPs.  CDAI Exam: CDAI Score: 35  Patient Global: 5 mm; Provider Global: 5 mm Swollen: 16 ; Tender: 18  Joint Exam 02/02/2021      Right  Left  Wrist   Tender   Tender  MCP 1  Swollen Tender  Swollen Tender  MCP 2  Swollen Tender  Swollen Tender  MCP 3  Swollen Tender  Swollen Tender  IP  Swollen Tender  Swollen Tender  PIP 2  Swollen Tender  Swollen Tender  PIP 3  Swollen Tender  Swollen Tender  PIP 4  Swollen Tender  Swollen Tender  PIP 5  Swollen Tender  Swollen Tender     Investigation: No additional findings.  Imaging: DG [facility] DEXA  Result Date: 01/31/2021 CLINICAL DATA:  Postmenopausal osteoporosis screening. EXAM: DUAL X-RAY ABSORPTIOMETRY (DXA) FOR BONE MINERAL DENSITY TECHNIQUE: Bone mineral density measurements are performed of the spine, hip, and  forearm, as appropriate, per International Society of Clinical Densitometry recommendations. The pertinent regions of interest are reported below. Non-contributory values are not reported. Images are obtained for bone mineral density measurement and are not obtained for diagnostic purposes. FINDINGS: Left FEMUR neck Bone Mineral Density (BMD):  0.681 g/cm2 Young Adult T-Score: -2.6 Z-Score:  -0.9 Left FOREARM (1/3 RADIUS) Bone Mineral Density (BMD):  0.700 Young Adult T Score:  -2.1 Z Score:  -0.9 Unit: This study was performed at [facility] on a [place]. Scan quality: The scan quality is good. Exclusions: Lumbar spine due to degenerative changes and hardware. ASSESSMENT: Patient's diagnostic category is OSTEOPOROSIS by WHO Criteria. FRACTURE RISK: INCREASED. FRAX:  Not calculated due to T-score at or below -2.5. COMPARISON: 15.6% decrease in bone density over the hip compared to 05/06/2007 which is statistically significant. RECOMMENDATIONS 1. All patients should optimize calcium and vitamin D intake. 2. Consider FDA-approved medical therapies in postmenopausal women and men aged 74 years and older, based on the following: - A hip or vertebral (clinical or morphometric) fracture - T-score less than or equal to -2.5 at the femoral neck or spine after appropriate evaluation to exclude secondary causes - Low bone mass (T-score between -1.0 and -2.5 at the femoral neck or spine) and a 10-year probability of a hip fracture greater than or equal to 3% or a 10-year probability of a major osteoporosis-related fracture greater than or equal to 20% based on the [place]-adapted WHO algorithm - Clinician judgment and/or patient preferences may indicate treatment for people with 10-year fracture probabilities above or below these levels 3. Patients with diagnosis of osteoporosis or at high risk for fracture should have regular bone mineral density tests. For patients eligible for Medicare, routine  testing is allowed once every 2 years. The testing frequency can be increased to one year for patients who have rapidly progressing disease, those who are receiving or discontinuing medical therapy to restore bone mass, or have additional risk factors. Electronically Signed   By:  Marin Olp M.D.   On: 01/31/2021 17:06    Recent Labs: Lab Results  Component Value Date   WBC 10.8 11/18/2020   HGB 13.4 11/18/2020   PLT 369 11/18/2020   NA 141 11/30/2020   K 4.4 11/30/2020   CL 102 11/30/2020   CO2 21 11/30/2020   GLUCOSE 96 11/30/2020   BUN 6 (L) 11/30/2020   CREATININE 0.61 11/30/2020   BILITOT <0.2 02/03/2020   ALKPHOS 117 02/03/2020   AST 13 02/03/2020   ALT 9 02/03/2020   PROT 6.6 02/03/2020   ALBUMIN 4.3 02/03/2020   CALCIUM 9.8 11/30/2020   GFRAA 108 02/03/2020   June 01, 2020 CRP 11, ESR 21, RF negative, ANA negative  Speciality Comments: No specialty comments available.  Procedures:  No procedures performed Allergies: Patient has no known allergies.   Assessment / Plan:     Visit Diagnoses: Pain in both hands -she complains of pain and discomfort in her bilateral hands.  She had synovitis in her MCPs and PIP joints.  Plan: XR Hand 2 View Right, XR Hand 2 View Left, x-ray of bilateral hands consistent with osteoarthritis.  Cyclic citrul peptide antibody, IgG, 14-3-3 eta Protein, Uric acid  Rheumatoid arthritis of multiple sites with negative rheumatoid factor (HCC)-clinical findings are consistent with rheumatoid arthritis with synovitis involving bilateral MCPs and PIP joints.  I discussed rheumatoid arthritis briefly today.  I will had detailed discussion at the follow-up visit once the lab results are available.  As she is diabetic I decided not to put her on any prednisone.  I will see her soon after lab work and plan to start her on hydroxychloroquine.  If she has an adequate response to hydroxychloroquine then she may need more aggressive therapy.  I will obtain  some baseline labs today.  High risk medication use - Plan: COMPLETE METABOLIC PANEL WITH GFR, Hepatitis B core antibody, IgM, Hepatitis B surface antigen, Hepatitis C antibody, HIV Antibody (routine testing w rflx), QuantiFERON-TB Gold Plus, Serum protein electrophoresis with reflex, IgG, IgA, IgM, Glucose 6 phosphate dehydrogenase  DDD (degenerative disc disease), cervical - Status post fusion 2017 by Dr. Collie [facility]ad at [facility].  Doing well.  DDD (degenerative disc disease), lumbar - Status post fusion X3 by Dr. Maxie Better.  She has residual left-sided weakness.  She has chronic lower back pain and also radiculopathy.  Age-related osteoporosis without current pathological fracture-most recent DEXA scan was consistent with osteoporosis.  She has not started any treatment yet.  Hypertension associated with diabetes (HCC)-her blood pressure is well controlled.  Hyperlipidemia associated with type 2 diabetes mellitus (HCC) - on Lipitor.  Type 2 diabetes mellitus with hyperglycemia, without long-term current use of insulin (HCC) - on Trulicity.  History of gastroesophageal reflux (GERD) - She is on Nexium.  Tobacco use disorder - 2 PPDx 8 years, 1/2 PPDx 6 mths.  Other fatigue - Plan: CK  Orders: Orders Placed This Encounter  Procedures  . XR Hand 2 View Right  . XR Hand 2 View Left  . COMPLETE METABOLIC PANEL WITH GFR  . CK  . Cyclic citrul peptide antibody, IgG  . 14-3-3 eta Protein  . Uric acid  . Hepatitis B core antibody, IgM  . Hepatitis B surface antigen  . Hepatitis C antibody  . HIV Antibody (routine testing w rflx)  . QuantiFERON-TB Gold Plus  . Serum protein electrophoresis with reflex  . IgG, IgA, IgM  . Glucose 6 phosphate dehydrogenase   No orders  of the defined types were placed in this encounter.     Follow-Up Instructions: Return for DDD, Rheumatoid arthritis.   Bo Merino, MD  Note - This record has been created using Editor, commissioning.  Chart  creation errors have been sought, but may not always  have been located. Such creation errors do not reflect on  the standard of medical care.

## 2021-01-21 ENCOUNTER — Encounter: Payer: Self-pay | Admitting: *Deleted

## 2021-01-26 ENCOUNTER — Encounter: Payer: Self-pay | Admitting: Family Medicine

## 2021-01-26 ENCOUNTER — Ambulatory Visit (INDEPENDENT_AMBULATORY_CARE_PROVIDER_SITE_OTHER): Payer: Medicare HMO | Admitting: Family Medicine

## 2021-01-26 ENCOUNTER — Other Ambulatory Visit: Payer: Self-pay

## 2021-01-26 ENCOUNTER — Ambulatory Visit (INDEPENDENT_AMBULATORY_CARE_PROVIDER_SITE_OTHER): Payer: Medicare HMO

## 2021-01-26 VITALS — BP 113/67 | HR 87 | Temp 97.6°F | Ht 59.0 in | Wt 119.8 lb

## 2021-01-26 DIAGNOSIS — J410 Simple chronic bronchitis: Secondary | ICD-10-CM

## 2021-01-26 DIAGNOSIS — Z780 Asymptomatic menopausal state: Secondary | ICD-10-CM

## 2021-01-26 DIAGNOSIS — E1169 Type 2 diabetes mellitus with other specified complication: Secondary | ICD-10-CM

## 2021-01-26 NOTE — Progress Notes (Signed)
Subjective: CC: Follow-up diabetes, breathing PCP: [name], DO QJJ:HERDE Lori Schroeder is a 65 y.o. female presenting to clinic today for:  1.  COPD Patient notes that the [name] seems to be doing a great job.  However it has been quite expensive and she worries about affordability of this.  She has an appointment with pulmonology shortly.  She reports that her cough and shortness of breath have improved a great deal but she also has really gone down on the cigarettes.  She was smoking anywhere between 1 and 2 packs/day and is now down to 5 to 10 cigarettes/day but is also supplementing with vape.  2.  Diabetes Patient is compliant with her Trulicity.  Her A1c in March was 6.8.  She feels well on this medicine but again cannot afford the medication.  She has tried independently applying for assistance but has not been successful   ROS: Per HPI  No Known Allergies Past Medical History:  Diagnosis Date  . Blood transfusion without reported diagnosis   . External hemorrhoids without mention of complication [id number]   Colonoscopy  . Family history of malignant neoplasm of gastrointestinal tract   . Family history of malignant neoplasm of gastrointestinal tract   . GERD (gastroesophageal reflux disease)   . Hyperlipemia   . Hypertension     Current Outpatient Medications:  .  albuterol (VENTOLIN HFA) 108 (90 Base) MCG/ACT inhaler, 2 PUFFS EVERY 6 HOURS AS NEEDED FOR WHEEZING OR SHORTNESS OF BREATH, Disp: 8.5 g, Rfl: 2 .  atorvastatin (LIPITOR) 10 MG tablet, TAKE 1 TABLET EVERY DAY, Disp: 90 tablet, Rfl: 1 .  Budeson-Glycopyrrol-Formoterol (BREZTRI AEROSPHERE) 160-9-4.8 MCG/ACT AERO, Inhale 2 puffs into the lungs in the morning and at bedtime., Disp: 10.7 g, Rfl: 0 .  esomeprazole (NEXIUM) 40 MG capsule, TAKE (1) CAPSULE TWICE DAILY., Disp: 60 capsule, Rfl: 5 .  fluticasone (FLONASE) 50 MCG/ACT nasal spray, Place 2 sprays into both nostrils daily., Disp: 16 g, Rfl: 6 .  naloxone  (NARCAN) nasal spray 4 mg/0.1 mL, Place 1 spray into the nose once., Disp: , Rfl:  .  pregabalin (LYRICA) 300 MG capsule, Take 300 mg by mouth daily. , Disp: , Rfl:  .  pregabalin (LYRICA) 50 MG capsule, Take 50 mg by mouth daily., Disp: , Rfl:  .  tiZANidine (ZANAFLEX) 4 MG tablet, TAKE ONE TABLET BY MOUTH THREE TIMES DAILY AS NEEDED FOR MUSCLE SPASM, Disp: , Rfl:  .  TRULICITY 1.5 EH/6.3JS SOPN, Inject 1.5 mg into the skin once a week., Disp: 2 mL, Rfl: 4 .  XTAMPZA ER 13.5 MG C12A, Take 1 capsule by mouth every 12 (twelve) hours., Disp: , Rfl:  Social History   Socioeconomic History  . Marital status: Married    Spouse name: Not on file  . Number of children: 2  . Years of education: 25  . Highest education level: Associate degree: occupational, Hotel manager, or vocational program  Occupational History  . Occupation: disability    Employer: DISABLED  Tobacco Use  . Smoking status: Current Every Day Smoker    Packs/day: 0.50    Years: 10.00    Pack years: 5.00    Types: Cigarettes  . Smokeless tobacco: Never Used  Vaping Use  . Vaping Use: Never used  Substance and Sexual Activity  . Alcohol use: No  . Drug use: No  . Sexual activity: Not on file  Other Topics Concern  . Not on file  Social History Narrative  . Not  on file   Social Determinants of Health   Financial Resource Strain: Not on file  Food Insecurity: Not on file  Transportation Needs: Not on file  Physical Activity: Not on file  Stress: Not on file  Social Connections: Not on file  Intimate Partner Violence: Not on file   Family History  Problem Relation Age of Onset  . Colon cancer Maternal Grandmother   . Alzheimer's disease Mother   . Hyperlipidemia Mother   . Hypertension Mother   . Cancer Father   . Alcohol abuse Father   . Diabetes Daughter        Type 1  . Breast cancer Neg Hx   . Celiac disease Neg Hx   . Cirrhosis Neg Hx   . Clotting disorder Neg Hx   . Colitis Neg Hx   . Colon polyps  Neg Hx   . Crohn's disease Neg Hx   . Cystic fibrosis Neg Hx   . Esophageal cancer Neg Hx   . Heart disease Neg Hx   . Hemochromatosis Neg Hx   . Inflammatory bowel disease Neg Hx   . Irritable bowel syndrome Neg Hx   . Kidney disease Neg Hx   . Liver cancer Neg Hx   . Liver disease Neg Hx   . Ovarian cancer Neg Hx   . Pancreatic cancer Neg Hx   . Prostate cancer Neg Hx   . Rectal cancer Neg Hx   . Stomach cancer Neg Hx   . Ulcerative colitis Neg Hx   . Uterine cancer Neg Hx   . Wilson's disease Neg Hx     Objective: Office vital signs reviewed. BP 113/67   Pulse 87   Temp 97.6 F (36.4 C) (Temporal)   Ht 4\' 11"  (1.499 m)   Wt 119 lb 12.8 oz (54.3 kg)   SpO2 99%   BMI 24.20 kg/m   Physical Examination:  General: Awake, alert, well nourished, No acute distress HEENT: Normal; sclera white.  Moist mucous membranes Cardio: regular rate and rhythm, S1S2 heard, no murmurs appreciated Pulm: clear to auscultation bilaterally, no wheezes, rhonchi or rales; normal work of breathing on room air Extremities: warm, well perfused, No edema, cyanosis or clubbing; +2 pulses bilaterally MSK: normal gait and station  Assessment/ Plan: 65 y.o. female   Controlled type 2 diabetes mellitus with other specified complication, without long-term current use of insulin (HCC)  Simple chronic bronchitis (HCC)  Post-menopausal - Plan: DG [facility] DEXA  Sugars well controlled on Trulicity so I hesitate to switch her off of this medicine.  I am giving her samples to get her through the next couple of weeks while we try and work with our pharmacist to get her some the patient assistance.  Patient is on Medicare with her disability so I feel that we should be successful with this but I have asked her to bring her financial statements to that appointment just in case.  I am fine with her switching off to something like Ozempic if were able to get that more easily for the patient.  We discussed that we  may end up needing to switch the [facility] out for something else similar again if we cannot get this through a patient assistance program.  She was comfortable with both of these options.  Staples also given today  She will follow-up with Almyra Free in the next couple of weeks for the after mentioned.  DEXA also collected today  Orders Placed This Encounter  Procedures  .  DG [facility] DEXA    Order Specific Question:   Reason for Exam (SYMPTOM  OR DIAGNOSIS REQUIRED)    Answer:   screening for ostoporosis   No orders of the defined types were placed in this encounter.    [name], DO [place] [phone]

## 2021-01-27 DIAGNOSIS — M810 Age-related osteoporosis without current pathological fracture: Secondary | ICD-10-CM | POA: Diagnosis not present

## 2021-01-27 DIAGNOSIS — M8588 Other specified disorders of bone density and structure, other site: Secondary | ICD-10-CM | POA: Diagnosis not present

## 2021-01-27 DIAGNOSIS — Z780 Asymptomatic menopausal state: Secondary | ICD-10-CM | POA: Diagnosis not present

## 2021-01-28 ENCOUNTER — Institutional Professional Consult (permissible substitution): Payer: Medicare Other | Admitting: Pulmonary Disease

## 2021-01-29 NOTE — Progress Notes (Deleted)
Office Visit Note  Patient: Lori Schroeder             Date of Birth: 08/22/1956           MRN: [id number]             PCP: [name], DO Referring: [name], DO Visit Date: 02/04/2021 Occupation: @GUAROCC @  Subjective:  No chief complaint on file.   History of Present Illness: Lori Schroeder is a 65 y.o. female ***   Activities of Daily Living:  Patient reports morning stiffness for *** {minute/hour:19697}.   Patient {ACTIONS;DENIES/REPORTS:21021675::"Denies"} nocturnal pain.  Difficulty dressing/grooming: {ACTIONS;DENIES/REPORTS:21021675::"Denies"} Difficulty climbing stairs: {ACTIONS;DENIES/REPORTS:21021675::"Denies"} Difficulty getting out of chair: {ACTIONS;DENIES/REPORTS:21021675::"Denies"} Difficulty using hands for taps, buttons, cutlery, and/or writing: {ACTIONS;DENIES/REPORTS:21021675::"Denies"}  No Rheumatology ROS completed.   PMFS History:  Patient Active Problem List   Diagnosis Date Noted  . Type 2 diabetes mellitus with hyperglycemia ([place]) 01/08/2018  . CTS (carpal tunnel syndrome) 01/05/2017  . Tobacco use disorder 09/15/2016  . Chronic back pain 02/01/2016  . Allergic rhinitis due to pollen 02/01/2016  . BMI 26.0-26.9,adult 10/29/2015  . Hyperlipidemia associated with type 2 diabetes mellitus ([place]) 10/14/2010  . Hypertension associated with diabetes ([place]) 10/14/2010  . GERD 10/19/2007    Past Medical History:  Diagnosis Date  . Blood transfusion without reported diagnosis   . External hemorrhoids without mention of complication [id number]   Colonoscopy  . Family history of malignant neoplasm of gastrointestinal tract   . Family history of malignant neoplasm of gastrointestinal tract   . GERD (gastroesophageal reflux disease)   . Hyperlipemia   . Hypertension     Family History  Problem Relation Age of Onset  . Colon cancer Maternal Grandmother   . Alzheimer's disease Mother   . Hyperlipidemia Mother   . Hypertension Mother   .  Cancer Father   . Alcohol abuse Father   . Diabetes Daughter        Type 1  . Breast cancer Neg Hx   . Celiac disease Neg Hx   . Cirrhosis Neg Hx   . Clotting disorder Neg Hx   . Colitis Neg Hx   . Colon polyps Neg Hx   . Crohn's disease Neg Hx   . Cystic fibrosis Neg Hx   . Esophageal cancer Neg Hx   . Heart disease Neg Hx   . Hemochromatosis Neg Hx   . Inflammatory bowel disease Neg Hx   . Irritable bowel syndrome Neg Hx   . Kidney disease Neg Hx   . Liver cancer Neg Hx   . Liver disease Neg Hx   . Ovarian cancer Neg Hx   . Pancreatic cancer Neg Hx   . Prostate cancer Neg Hx   . Rectal cancer Neg Hx   . Stomach cancer Neg Hx   . Ulcerative colitis Neg Hx   . Uterine cancer Neg Hx   . Wilson's disease Neg Hx    Past Surgical History:  Procedure Laterality Date  . ABDOMINAL HYSTERECTOMY    . BACK SURGERY    . BLADDER SURGERY     Bladder Tact  . CARPAL TUNNEL RELEASE Right   . COLONOSCOPY    . NECK SURGERY     Social History   Social History Narrative  . Not on file   Immunization History  Administered Date(s) Administered  . Influenza-Unspecified 10/02/2018  . Pneumococcal Conjugate-13 05/29/2018  . Tdap 05/29/2018     Objective: Vital Signs: There were no vitals  taken for this visit.   Physical Exam   Musculoskeletal Exam: ***  CDAI Exam: CDAI Score: -- Patient Global: --; Provider Global: -- Swollen: --; Tender: -- Joint Exam 02/04/2021   No joint exam has been documented for this visit   There is currently no information documented on the homunculus. Go to the Rheumatology activity and complete the homunculus joint exam.  Investigation: No additional findings.  Imaging: No results found.  Recent Labs: Lab Results  Component Value Date   WBC 10.8 11/18/2020   HGB 13.4 11/18/2020   PLT 369 11/18/2020   NA 141 11/30/2020   K 4.4 11/30/2020   CL 102 11/30/2020   CO2 21 11/30/2020   GLUCOSE 96 11/30/2020   BUN 6 (L) 11/30/2020    CREATININE 0.61 11/30/2020   BILITOT <0.2 02/03/2020   ALKPHOS 117 02/03/2020   AST 13 02/03/2020   ALT 9 02/03/2020   PROT 6.6 02/03/2020   ALBUMIN 4.3 02/03/2020   CALCIUM 9.8 11/30/2020   GFRAA 108 02/03/2020      Speciality Comments: No specialty comments available.  Procedures:  No procedures performed Allergies: Patient has no known allergies.   Assessment / Plan:     Visit Diagnoses: No diagnosis found.  Orders: No orders of the defined types were placed in this encounter.  No orders of the defined types were placed in this encounter.   Face-to-face time spent with patient was *** minutes. Greater than 50% of time was spent in counseling and coordination of care.  Follow-Up Instructions: No follow-ups on file.   Bo Merino, MD  Note - This record has been created using Editor, commissioning.  Chart creation errors have been sought, but may not always  have been located. Such creation errors do not reflect on  the standard of medical care.

## 2021-02-01 ENCOUNTER — Other Ambulatory Visit: Payer: Self-pay | Admitting: *Deleted

## 2021-02-01 ENCOUNTER — Encounter: Payer: Self-pay | Admitting: Family Medicine

## 2021-02-01 DIAGNOSIS — M79676 Pain in unspecified toe(s): Secondary | ICD-10-CM | POA: Diagnosis not present

## 2021-02-01 DIAGNOSIS — M810 Age-related osteoporosis without current pathological fracture: Secondary | ICD-10-CM | POA: Insufficient documentation

## 2021-02-01 DIAGNOSIS — B351 Tinea unguium: Secondary | ICD-10-CM | POA: Diagnosis not present

## 2021-02-01 DIAGNOSIS — E1151 Type 2 diabetes mellitus with diabetic peripheral angiopathy without gangrene: Secondary | ICD-10-CM | POA: Diagnosis not present

## 2021-02-01 DIAGNOSIS — B070 Plantar wart: Secondary | ICD-10-CM | POA: Diagnosis not present

## 2021-02-01 DIAGNOSIS — M79672 Pain in left foot: Secondary | ICD-10-CM | POA: Diagnosis not present

## 2021-02-02 ENCOUNTER — Ambulatory Visit: Payer: Self-pay

## 2021-02-02 ENCOUNTER — Encounter: Payer: Self-pay | Admitting: Rheumatology

## 2021-02-02 ENCOUNTER — Ambulatory Visit (INDEPENDENT_AMBULATORY_CARE_PROVIDER_SITE_OTHER): Payer: Medicare HMO | Admitting: Rheumatology

## 2021-02-02 ENCOUNTER — Other Ambulatory Visit: Payer: Self-pay

## 2021-02-02 VITALS — BP 121/76 | HR 82 | Ht <= 58 in | Wt 120.8 lb

## 2021-02-02 DIAGNOSIS — M810 Age-related osteoporosis without current pathological fracture: Secondary | ICD-10-CM | POA: Diagnosis not present

## 2021-02-02 DIAGNOSIS — G5601 Carpal tunnel syndrome, right upper limb: Secondary | ICD-10-CM

## 2021-02-02 DIAGNOSIS — M5030 Other cervical disc degeneration, unspecified cervical region: Secondary | ICD-10-CM | POA: Diagnosis not present

## 2021-02-02 DIAGNOSIS — I152 Hypertension secondary to endocrine disorders: Secondary | ICD-10-CM

## 2021-02-02 DIAGNOSIS — E1169 Type 2 diabetes mellitus with other specified complication: Secondary | ICD-10-CM | POA: Diagnosis not present

## 2021-02-02 DIAGNOSIS — E785 Hyperlipidemia, unspecified: Secondary | ICD-10-CM

## 2021-02-02 DIAGNOSIS — Z8719 Personal history of other diseases of the digestive system: Secondary | ICD-10-CM

## 2021-02-02 DIAGNOSIS — Z79899 Other long term (current) drug therapy: Secondary | ICD-10-CM | POA: Diagnosis not present

## 2021-02-02 DIAGNOSIS — E1159 Type 2 diabetes mellitus with other circulatory complications: Secondary | ICD-10-CM | POA: Diagnosis not present

## 2021-02-02 DIAGNOSIS — E1165 Type 2 diabetes mellitus with hyperglycemia: Secondary | ICD-10-CM | POA: Diagnosis not present

## 2021-02-02 DIAGNOSIS — R5383 Other fatigue: Secondary | ICD-10-CM | POA: Diagnosis not present

## 2021-02-02 DIAGNOSIS — M79642 Pain in left hand: Secondary | ICD-10-CM | POA: Diagnosis not present

## 2021-02-02 DIAGNOSIS — F17200 Nicotine dependence, unspecified, uncomplicated: Secondary | ICD-10-CM

## 2021-02-02 DIAGNOSIS — M79641 Pain in right hand: Secondary | ICD-10-CM | POA: Diagnosis not present

## 2021-02-02 DIAGNOSIS — M2550 Pain in unspecified joint: Secondary | ICD-10-CM

## 2021-02-02 DIAGNOSIS — M0609 Rheumatoid arthritis without rheumatoid factor, multiple sites: Secondary | ICD-10-CM

## 2021-02-02 DIAGNOSIS — M5136 Other intervertebral disc degeneration, lumbar region: Secondary | ICD-10-CM | POA: Diagnosis not present

## 2021-02-02 DIAGNOSIS — R7982 Elevated C-reactive protein (CRP): Secondary | ICD-10-CM

## 2021-02-02 NOTE — Patient Instructions (Signed)
Hand Exercises Hand exercises can be helpful for almost anyone. These exercises can strengthen the hands, improve flexibility and movement, and increase blood flow to the hands. These results can make work and daily tasks easier. Hand exercises can be especially helpful for people who have joint pain from arthritis or have nerve damage from overuse (carpal tunnel syndrome). These exercises can also help people who have injured a hand. Exercises Most of these hand exercises are gentle stretching and motion exercises. It is usually safe to do them often throughout the day. Warming up your hands before exercise may help to reduce stiffness. You can do this with gentle massage or by placing your hands in warm water for 10-15 minutes. It is normal to feel some stretching, pulling, tightness, or mild discomfort as you begin new exercises. This will gradually improve. Stop an exercise right away if you feel sudden, severe pain or your pain gets worse. Ask your health care provider which exercises are best for you. Knuckle bend or "claw" fist 1. Stand or sit with your arm, hand, and all five fingers pointed straight up. Make sure to keep your wrist straight during the exercise. 2. Gently bend your fingers down toward your palm until the tips of your fingers are touching the top of your palm. Keep your big knuckle straight and just bend the small knuckles in your fingers. 3. Hold this position for __________ seconds. 4. Straighten (extend) your fingers back to the starting position. Repeat this exercise 5-10 times with each hand. Full finger fist 1. Stand or sit with your arm, hand, and all five fingers pointed straight up. Make sure to keep your wrist straight during the exercise. 2. Gently bend your fingers into your palm until the tips of your fingers are touching the middle of your palm. 3. Hold this position for __________ seconds. 4. Extend your fingers back to the starting position, stretching every  joint fully. Repeat this exercise 5-10 times with each hand. Straight fist 1. Stand or sit with your arm, hand, and all five fingers pointed straight up. Make sure to keep your wrist straight during the exercise. 2. Gently bend your fingers at the big knuckle, where your fingers meet your hand, and the middle knuckle. Keep the knuckle at the tips of your fingers straight and try to touch the bottom of your palm. 3. Hold this position for __________ seconds. 4. Extend your fingers back to the starting position, stretching every joint fully. Repeat this exercise 5-10 times with each hand. Tabletop 1. Stand or sit with your arm, hand, and all five fingers pointed straight up. Make sure to keep your wrist straight during the exercise. 2. Gently bend your fingers at the big knuckle, where your fingers meet your hand, as far down as you can while keeping the small knuckles in your fingers straight. Think of forming a tabletop with your fingers. 3. Hold this position for __________ seconds. 4. Extend your fingers back to the starting position, stretching every joint fully. Repeat this exercise 5-10 times with each hand. Finger spread 1. Place your hand flat on a table with your palm facing down. Make sure your wrist stays straight as you do this exercise. 2. Spread your fingers and thumb apart from each other as far as you can until you feel a gentle stretch. Hold this position for __________ seconds. 3. Bring your fingers and thumb tight together again. Hold this position for __________ seconds. Repeat this exercise 5-10 times with each hand.   Making circles 1. Stand or sit with your arm, hand, and all five fingers pointed straight up. Make sure to keep your wrist straight during the exercise. 2. Make a circle by touching the tip of your thumb to the tip of your index finger. 3. Hold for __________ seconds. Then open your hand wide. 4. Repeat this motion with your thumb and each finger on your  hand. Repeat this exercise 5-10 times with each hand. Thumb motion 1. Sit with your forearm resting on a table and your wrist straight. Your thumb should be facing up toward the ceiling. Keep your fingers relaxed as you move your thumb. 2. Lift your thumb up as high as you can toward the ceiling. Hold for __________ seconds. 3. Bend your thumb across your palm as far as you can, reaching the tip of your thumb for the small finger (pinkie) side of your palm. Hold for __________ seconds. Repeat this exercise 5-10 times with each hand. Grip strengthening 1. Hold a stress ball or other soft ball in the middle of your hand. 2. Slowly increase the pressure, squeezing the ball as much as you can without causing pain. Think of bringing the tips of your fingers into the middle of your palm. All of your finger joints should bend when doing this exercise. 3. Hold your squeeze for __________ seconds, then relax. Repeat this exercise 5-10 times with each hand.   Contact a health care provider if:  Your hand pain or discomfort gets much worse when you do an exercise.  Your hand pain or discomfort does not improve within 2 hours after you exercise. If you have any of these problems, stop doing these exercises right away. Do not do them again unless your health care provider says that you can. Get help right away if:  You develop sudden, severe hand pain or swelling. If this happens, stop doing these exercises right away. Do not do them again unless your health care provider says that you can. This information is not intended to replace advice given to you by your health care provider. Make sure you discuss any questions you have with your health care provider. Document Revised: 01/09/2019 Document Reviewed: 09/19/2018 Elsevier Patient Education  2021 [employer]  

## 2021-02-04 ENCOUNTER — Ambulatory Visit: Payer: Medicare Other | Admitting: Rheumatology

## 2021-02-07 ENCOUNTER — Other Ambulatory Visit: Payer: Self-pay

## 2021-02-07 DIAGNOSIS — M810 Age-related osteoporosis without current pathological fracture: Secondary | ICD-10-CM

## 2021-02-07 NOTE — Progress Notes (Signed)
All the labs are within normal limits except 14 3 3  eta is pending.  We will discuss results at the follow-up visit.

## 2021-02-08 ENCOUNTER — Ambulatory Visit: Payer: Medicare HMO | Admitting: Pharmacist

## 2021-02-11 NOTE — Progress Notes (Deleted)
Office Visit Note  Patient: Lori Schroeder             Date of Birth: Mar 11, 1956           MRN: [id number]             PCP: [name], DO Referring: [name], DO Visit Date: 02/21/2021 Occupation: @GUAROCC @  Subjective:  No chief complaint on file.   History of Present Illness: Lori Schroeder is a 65 y.o. female ***   Activities of Daily Living:  Patient reports morning stiffness for *** {minute/hour:19697}.   Patient {ACTIONS;DENIES/REPORTS:21021675::"Denies"} nocturnal pain.  Difficulty dressing/grooming: {ACTIONS;DENIES/REPORTS:21021675::"Denies"} Difficulty climbing stairs: {ACTIONS;DENIES/REPORTS:21021675::"Denies"} Difficulty getting out of chair: {ACTIONS;DENIES/REPORTS:21021675::"Denies"} Difficulty using hands for taps, buttons, cutlery, and/or writing: {ACTIONS;DENIES/REPORTS:21021675::"Denies"}  No Rheumatology ROS completed.   PMFS History:  Patient Active Problem List   Diagnosis Date Noted  . Osteoporosis 02/01/2021  . Type 2 diabetes mellitus with hyperglycemia ([place]) 01/08/2018  . CTS (carpal tunnel syndrome) 01/05/2017  . Tobacco use disorder 09/15/2016  . Chronic back pain 02/01/2016  . Allergic rhinitis due to pollen 02/01/2016  . BMI 26.0-26.9,adult 10/29/2015  . Hyperlipidemia associated with type 2 diabetes mellitus ([place]) 10/14/2010  . Hypertension associated with diabetes ([place]) 10/14/2010  . GERD 10/19/2007    Past Medical History:  Diagnosis Date  . Blood transfusion without reported diagnosis   . External hemorrhoids without mention of complication [id number]   Colonoscopy  . Family history of malignant neoplasm of gastrointestinal tract   . Family history of malignant neoplasm of gastrointestinal tract   . GERD (gastroesophageal reflux disease)   . Hyperlipemia   . Hypertension     Family History  Problem Relation Age of Onset  . Colon cancer Maternal Grandmother   . Arthritis Maternal Grandmother   . Alzheimer's disease  Mother   . Hyperlipidemia Mother   . Hypertension Mother   . Cancer Father   . Alcohol abuse Father   . Diabetes Daughter        Type 1  . Diabetes Son   . Breast cancer Neg Hx   . Celiac disease Neg Hx   . Cirrhosis Neg Hx   . Clotting disorder Neg Hx   . Colitis Neg Hx   . Colon polyps Neg Hx   . Crohn's disease Neg Hx   . Cystic fibrosis Neg Hx   . Esophageal cancer Neg Hx   . Heart disease Neg Hx   . Hemochromatosis Neg Hx   . Inflammatory bowel disease Neg Hx   . Irritable bowel syndrome Neg Hx   . Kidney disease Neg Hx   . Liver cancer Neg Hx   . Liver disease Neg Hx   . Ovarian cancer Neg Hx   . Pancreatic cancer Neg Hx   . Prostate cancer Neg Hx   . Rectal cancer Neg Hx   . Stomach cancer Neg Hx   . Ulcerative colitis Neg Hx   . Uterine cancer Neg Hx   . Wilson's disease Neg Hx    Past Surgical History:  Procedure Laterality Date  . ABDOMINAL HYSTERECTOMY    . BACK SURGERY    . BLADDER SURGERY     Bladder Tact  . CARPAL TUNNEL RELEASE Bilateral   . COLONOSCOPY    . FINGER SURGERY Right    Small finger fracture surgery  . NECK SURGERY     Social History   Social History Narrative  . Not on file   Immunization History  Administered Date(s) Administered  . Influenza-Unspecified 10/02/2018  . Pneumococcal Conjugate-13 05/29/2018  . Tdap 05/29/2018     Objective: Vital Signs: There were no vitals taken for this visit.   Physical Exam   Musculoskeletal Exam: ***  CDAI Exam: CDAI Score: -- Patient Global: --; Provider Global: -- Swollen: --; Tender: -- Joint Exam 02/21/2021   No joint exam has been documented for this visit   There is currently no information documented on the homunculus. Go to the Rheumatology activity and complete the homunculus joint exam.  Investigation: No additional findings.  Imaging: DG [facility] DEXA  Result Date: 01/31/2021 CLINICAL DATA:  Postmenopausal osteoporosis screening. EXAM: DUAL X-RAY ABSORPTIOMETRY (DXA)  FOR BONE MINERAL DENSITY TECHNIQUE: Bone mineral density measurements are performed of the spine, hip, and forearm, as appropriate, per International Society of Clinical Densitometry recommendations. The pertinent regions of interest are reported below. Non-contributory values are not reported. Images are obtained for bone mineral density measurement and are not obtained for diagnostic purposes. FINDINGS: Left FEMUR neck Bone Mineral Density (BMD):  0.681 g/cm2 Young Adult T-Score: -2.6 Z-Score:  -0.9 Left FOREARM (1/3 RADIUS) Bone Mineral Density (BMD):  0.700 Young Adult T Score:  -2.1 Z Score:  -0.9 Unit: This study was performed at [facility] on a [place]. Scan quality: The scan quality is good. Exclusions: Lumbar spine due to degenerative changes and hardware. ASSESSMENT: Patient's diagnostic category is OSTEOPOROSIS by WHO Criteria. FRACTURE RISK: INCREASED. FRAX:  Not calculated due to T-score at or below -2.5. COMPARISON: 15.6% decrease in bone density over the hip compared to 05/06/2007 which is statistically significant. RECOMMENDATIONS 1. All patients should optimize calcium and vitamin D intake. 2. Consider FDA-approved medical therapies in postmenopausal women and men aged 2 years and older, based on the following: - A hip or vertebral (clinical or morphometric) fracture - T-score less than or equal to -2.5 at the femoral neck or spine after appropriate evaluation to exclude secondary causes - Low bone mass (T-score between -1.0 and -2.5 at the femoral neck or spine) and a 10-year probability of a hip fracture greater than or equal to 3% or a 10-year probability of a major osteoporosis-related fracture greater than or equal to 20% based on the [place]-adapted WHO algorithm - Clinician judgment and/or patient preferences may indicate treatment for people with 10-year fracture probabilities above or below these levels 3. Patients with diagnosis of osteoporosis or at high  risk for fracture should have regular bone mineral density tests. For patients eligible for Medicare, routine testing is allowed once every 2 years. The testing frequency can be increased to one year for patients who have rapidly progressing disease, those who are receiving or discontinuing medical therapy to restore bone mass, or have additional risk factors. Electronically Signed   By: Marin Olp M.D.   On: 01/31/2021 17:06   XR Hand 2 View Left  Result Date: 02/02/2021 CMC, PIP and DIP narrowing was noted.  No MCP, intercarpal or radiocarpal joint space narrowing was noted.  No erosive changes were noted. Impression: These findings are consistent with osteoarthritis of the hand.  XR Hand 2 View Right  Result Date: 02/02/2021 CMC, PIP and DIP narrowing was noted.  No MCP, intercarpal or radiocarpal joint space narrowing was noted.  No erosive changes were noted. Impression: These findings are consistent with osteoarthritis of the hand.   Recent Labs: Lab Results  Component Value Date   WBC 10.8 11/18/2020   HGB 13.4 11/18/2020  PLT 369 11/18/2020   NA 139 02/02/2021   K 4.7 02/02/2021   CL 103 02/02/2021   CO2 24 02/02/2021   GLUCOSE 103 (H) 02/02/2021   BUN 7 02/02/2021   CREATININE 0.61 02/02/2021   BILITOT 0.3 02/02/2021   ALKPHOS 117 02/03/2020   AST 16 02/02/2021   ALT 16 02/02/2021   PROT 7.0 02/02/2021   PROT 7.0 02/02/2021   ALBUMIN 4.3 02/03/2020   CALCIUM 10.1 02/02/2021   GFRAA 112 02/02/2021   QFTBGOLDPLUS NEGATIVE 02/02/2021    Speciality Comments: No specialty comments available.  Procedures:  No procedures performed Allergies: Patient has no known allergies.   Assessment / Plan:     Visit Diagnoses: Rheumatoid arthritis of multiple sites with negative rheumatoid factor (HCC)  High risk medication use  DDD (degenerative disc disease), cervical  DDD (degenerative disc disease), lumbar  Age-related osteoporosis without current pathological  fracture  Hypertension associated with diabetes ([place])  Hyperlipidemia associated with type 2 diabetes mellitus ([place])  Type 2 diabetes mellitus with hyperglycemia, without long-term current use of insulin (HCC)  History of gastroesophageal reflux (GERD)  Tobacco use disorder  Other fatigue  Orders: No orders of the defined types were placed in this encounter.  No orders of the defined types were placed in this encounter.   Face-to-face time spent with patient was *** minutes. Greater than 50% of time was spent in counseling and coordination of care.  Follow-Up Instructions: No follow-ups on file.   [name] Neas, PA-C  Note - This record has been created using Dragon software.  Chart creation errors have been sought, but may not always  have been located. Such creation errors do not reflect on  the standard of medical care.

## 2021-02-15 LAB — PROTEIN ELECTROPHORESIS, SERUM, WITH REFLEX
Albumin ELP: 4 g/dL (ref 3.8–4.8)
Alpha 1: 0.4 g/dL — ABNORMAL HIGH (ref 0.2–0.3)
Alpha 2: 1 g/dL — ABNORMAL HIGH (ref 0.5–0.9)
Beta 2: 0.4 g/dL (ref 0.2–0.5)
Beta Globulin: 0.4 g/dL (ref 0.4–0.6)
Gamma Globulin: 0.9 g/dL (ref 0.8–1.7)
Total Protein: 7 g/dL (ref 6.1–8.1)

## 2021-02-15 LAB — QUANTIFERON-TB GOLD PLUS
Mitogen-NIL: >10 IU/mL
NIL: 0.02 IU/mL
QuantiFERON-TB Gold Plus: NEGATIVE
TB1-NIL: 0 IU/mL
TB2-NIL: 0 IU/mL

## 2021-02-15 LAB — COMPLETE METABOLIC PANEL WITH GFR
AG Ratio: 1.7 (calc) (ref 1.0–2.5)
ALT: 16 U/L (ref 6–29)
AST: 16 U/L (ref 10–35)
Albumin: 4.4 g/dL (ref 3.6–5.1)
Alkaline phosphatase (APISO): 110 U/L (ref 37–153)
BUN: 7 mg/dL (ref 7–25)
CO2: 24 mmol/L (ref 20–32)
Calcium: 10.1 mg/dL (ref 8.6–10.4)
Chloride: 103 mmol/L (ref 98–110)
Creat: 0.61 mg/dL (ref 0.50–0.99)
GFR, Est African American: 112 mL/min/{1.73_m2} (ref >=60)
GFR, Est Non African American: 96 mL/min/{1.73_m2} (ref >=60)
Globulin: 2.6 g/dL (calc) (ref 1.9–3.7)
Glucose, Bld: 103 mg/dL — ABNORMAL HIGH (ref 65–99)
Potassium: 4.7 mmol/L (ref 3.5–5.3)
Sodium: 139 mmol/L (ref 135–146)
Total Bilirubin: 0.3 mg/dL (ref 0.2–1.2)
Total Protein: 7 g/dL (ref 6.1–8.1)

## 2021-02-15 LAB — HIV ANTIBODY (ROUTINE TESTING W REFLEX): HIV 1&2 Ab, 4th Generation: NONREACTIVE

## 2021-02-15 LAB — HEPATITIS B SURFACE ANTIGEN: Hepatitis B Surface Ag: NONREACTIVE

## 2021-02-15 LAB — IFE INTERPRETATION: Immunofix Electr Int: NOT DETECTED

## 2021-02-15 LAB — CK: Total CK: 54 U/L (ref 29–143)

## 2021-02-15 LAB — IGG, IGA, IGM
IgG (Immunoglobin G), Serum: 998 mg/dL (ref 600–1540)
IgM, Serum: 51 mg/dL (ref 50–300)
Immunoglobulin A: 246 mg/dL (ref 70–320)

## 2021-02-15 LAB — HEPATITIS C ANTIBODY
Hepatitis C Ab: NONREACTIVE
SIGNAL TO CUT-OFF: 0 (ref <1.00)

## 2021-02-15 LAB — CYCLIC CITRUL PEPTIDE ANTIBODY, IGG: Cyclic Citrullin Peptide Ab: <16 UNITS

## 2021-02-15 LAB — URIC ACID: Uric Acid, Serum: 4.3 mg/dL (ref 2.5–7.0)

## 2021-02-15 LAB — 14-3-3 ETA PROTEIN: 14-3-3 eta Protein: <0.2 ng/mL (ref <0.2)

## 2021-02-15 LAB — HEPATITIS B CORE ANTIBODY, IGM: Hep B C IgM: NONREACTIVE

## 2021-02-15 LAB — GLUCOSE 6 PHOSPHATE DEHYDROGENASE: G-6PDH: 14.4 U/g Hgb (ref 7.0–20.5)

## 2021-02-17 ENCOUNTER — Other Ambulatory Visit: Payer: Self-pay

## 2021-02-17 ENCOUNTER — Ambulatory Visit (INDEPENDENT_AMBULATORY_CARE_PROVIDER_SITE_OTHER): Payer: Medicare HMO | Admitting: Pharmacist

## 2021-02-17 DIAGNOSIS — E119 Type 2 diabetes mellitus without complications: Secondary | ICD-10-CM

## 2021-02-17 NOTE — Progress Notes (Signed)
    02/17/2021 Name: Lori Schroeder MRN: [id number] DOB: 03-20-1956   S:  65 yoF Presents for diabetes evaluation, education, and management Patient was referred and last seen by Primary Care Provider on 02/14/21 Insurance coverage/medication affordability: [employer]  Patient reports adherence with medications. . Current diabetes medications include: trulicity . Current hypertension medications include: n/a Goal 130/80 . Current hyperlipidemia medications include: atorvastatin   Patient reports hypoglycemic events.   Patient reported dietary habit   O:  Lab Results  Component Value Date   HGBA1C 6.8 11/30/2020    Lipid Panel     Component Value Date/Time   CHOL 155 02/03/2020 1037   TRIG 126 02/03/2020 1037   TRIG 187 (H) 07/10/2014 1505   HDL 54 02/03/2020 1037   HDL 46 07/10/2014 1505   CHOLHDL 2.9 02/03/2020 1037   LDLCALC 79 02/03/2020 1037   LDLCALC 106 (H) 07/10/2014 1505     Home fasting blood sugars: <130  2 hour post-meal/random blood sugars: <180.    Clinical Atherosclerotic Cardiovascular Disease (ASCVD): No   The 10-year ASCVD risk score Mikey Bussing DC Jr., et al., 2013) is: 12.6%   Values used to calculate the score:     Age: 65 years     Sex: Female     Is Non-Hispanic African American: No     Diabetic: Yes     Tobacco smoker: Yes     Systolic Blood Pressure: 097 mmHg     Is BP treated: No     HDL Cholesterol: 54 mg/dL     Total Cholesterol: 155 mg/dL    A/P:   Diabetes t2dm currently CONTROLLED  -Continue Trulicity 1.5mg  sq weekly  Denies personal and family history of Medullary thyroid cancer (MTC)  Application for [facility] patient assistance submitted  Patient compliant with therapy  -Extensively discussed pathophysiology of diabetes, recommended lifestyle interventions, dietary effects on blood sugar control  -Counseled on s/sx of and management of hypoglycemia   Written patient instructions provided.  Total time in face to face  counseling 25 minutes.    Regina Eck, PharmD, BCPS Clinical Pharmacist, [place]  [place] Phone [phone]

## 2021-02-21 ENCOUNTER — Ambulatory Visit: Payer: Medicare HMO | Admitting: Physician Assistant

## 2021-02-25 ENCOUNTER — Institutional Professional Consult (permissible substitution): Payer: Medicare HMO | Admitting: Pulmonary Disease

## 2021-03-01 ENCOUNTER — Ambulatory Visit: Payer: Medicare HMO | Admitting: Physician Assistant

## 2021-03-01 NOTE — Progress Notes (Deleted)
Office Visit Note  Patient: Lori Schroeder             Date of Birth: 1956-06-13           MRN: [id number]             PCP: [name], DO Referring: [name], DO Visit Date: 03/01/2021 Occupation: @GUAROCC @  Subjective:  No chief complaint on file.   History of Present Illness: Lori Schroeder is a 65 y.o. female ***   Activities of Daily Living:  Patient reports morning stiffness for *** {minute/hour:19697}.   Patient {ACTIONS;DENIES/REPORTS:21021675::"Denies"} nocturnal pain.  Difficulty dressing/grooming: {ACTIONS;DENIES/REPORTS:21021675::"Denies"} Difficulty climbing stairs: {ACTIONS;DENIES/REPORTS:21021675::"Denies"} Difficulty getting out of chair: {ACTIONS;DENIES/REPORTS:21021675::"Denies"} Difficulty using hands for taps, buttons, cutlery, and/or writing: {ACTIONS;DENIES/REPORTS:21021675::"Denies"}  No Rheumatology ROS completed.   PMFS History:  Patient Active Problem List   Diagnosis Date Noted  . Osteoporosis 02/01/2021  . Type 2 diabetes mellitus with hyperglycemia ([place]) 01/08/2018  . CTS (carpal tunnel syndrome) 01/05/2017  . Tobacco use disorder 09/15/2016  . Chronic back pain 02/01/2016  . Allergic rhinitis due to pollen 02/01/2016  . BMI 26.0-26.9,adult 10/29/2015  . Hyperlipidemia associated with type 2 diabetes mellitus ([place]) 10/14/2010  . Hypertension associated with diabetes ([place]) 10/14/2010  . GERD 10/19/2007    Past Medical History:  Diagnosis Date  . Blood transfusion without reported diagnosis   . External hemorrhoids without mention of complication [id number]   Colonoscopy  . Family history of malignant neoplasm of gastrointestinal tract   . Family history of malignant neoplasm of gastrointestinal tract   . GERD (gastroesophageal reflux disease)   . Hyperlipemia   . Hypertension     Family History  Problem Relation Age of Onset  . Colon cancer Maternal Grandmother   . Arthritis Maternal Grandmother   . Alzheimer's disease  Mother   . Hyperlipidemia Mother   . Hypertension Mother   . Cancer Father   . Alcohol abuse Father   . Diabetes Daughter        Type 1  . Diabetes Son   . Breast cancer Neg Hx   . Celiac disease Neg Hx   . Cirrhosis Neg Hx   . Clotting disorder Neg Hx   . Colitis Neg Hx   . Colon polyps Neg Hx   . Crohn's disease Neg Hx   . Cystic fibrosis Neg Hx   . Esophageal cancer Neg Hx   . Heart disease Neg Hx   . Hemochromatosis Neg Hx   . Inflammatory bowel disease Neg Hx   . Irritable bowel syndrome Neg Hx   . Kidney disease Neg Hx   . Liver cancer Neg Hx   . Liver disease Neg Hx   . Ovarian cancer Neg Hx   . Pancreatic cancer Neg Hx   . Prostate cancer Neg Hx   . Rectal cancer Neg Hx   . Stomach cancer Neg Hx   . Ulcerative colitis Neg Hx   . Uterine cancer Neg Hx   . Wilson's disease Neg Hx    Past Surgical History:  Procedure Laterality Date  . ABDOMINAL HYSTERECTOMY    . BACK SURGERY    . BLADDER SURGERY     Bladder Tact  . CARPAL TUNNEL RELEASE Bilateral   . COLONOSCOPY    . FINGER SURGERY Right    Small finger fracture surgery  . NECK SURGERY     Social History   Social History Narrative  . Not on file   Immunization History  Administered Date(s) Administered  . Influenza-Unspecified 10/02/2018  . Pneumococcal Conjugate-13 05/29/2018  . Tdap 05/29/2018     Objective: Vital Signs: There were no vitals taken for this visit.   Physical Exam   Musculoskeletal Exam: ***  CDAI Exam: CDAI Score: -- Patient Global: --; Provider Global: -- Swollen: --; Tender: -- Joint Exam 03/01/2021   No joint exam has been documented for this visit   There is currently no information documented on the homunculus. Go to the Rheumatology activity and complete the homunculus joint exam.  Investigation: No additional findings.  Imaging: XR Hand 2 View Left  Result Date: 02/02/2021 CMC, PIP and DIP narrowing was noted.  No MCP, intercarpal or radiocarpal joint space  narrowing was noted.  No erosive changes were noted. Impression: These findings are consistent with osteoarthritis of the hand.  XR Hand 2 View Right  Result Date: 02/02/2021 CMC, PIP and DIP narrowing was noted.  No MCP, intercarpal or radiocarpal joint space narrowing was noted.  No erosive changes were noted. Impression: These findings are consistent with osteoarthritis of the hand.   Recent Labs: Lab Results  Component Value Date   WBC 10.8 11/18/2020   HGB 13.4 11/18/2020   PLT 369 11/18/2020   NA 139 02/02/2021   K 4.7 02/02/2021   CL 103 02/02/2021   CO2 24 02/02/2021   GLUCOSE 103 (H) 02/02/2021   BUN 7 02/02/2021   CREATININE 0.61 02/02/2021   BILITOT 0.3 02/02/2021   ALKPHOS 117 02/03/2020   AST 16 02/02/2021   ALT 16 02/02/2021   PROT 7.0 02/02/2021   PROT 7.0 02/02/2021   ALBUMIN 4.3 02/03/2020   CALCIUM 10.1 02/02/2021   GFRAA 112 02/02/2021   QFTBGOLDPLUS NEGATIVE 02/02/2021    Speciality Comments: No specialty comments available.  Procedures:  No procedures performed Allergies: Patient has no known allergies.   Assessment / Plan:     Visit Diagnoses: Rheumatoid arthritis of multiple sites with negative rheumatoid factor (HCC) - Anti-CCP<16, 14-3-3 eta negative, no erosive changes  High risk medication use - Discuss PLQ  Primary osteoarthritis of both hands - X-rays of both hands were consistent with osteoarthritis of both hands  DDD (degenerative disc disease), cervical  DDD (degenerative disc disease), lumbar  Age-related osteoporosis without current pathological fracture - DEXA 01/26/21: LFN BMD 0.681 with T-score -2.6.  Hypertension associated with diabetes ([place])  Hyperlipidemia associated with type 2 diabetes mellitus ([place])  Type 2 diabetes mellitus with hyperglycemia, without long-term current use of insulin (HCC)  History of gastroesophageal reflux (GERD)  Tobacco use disorder  Other fatigue  Orders: No orders of the defined types  were placed in this encounter.  No orders of the defined types were placed in this encounter.   Face-to-face time spent with patient was *** minutes. Greater than 50% of time was spent in counseling and coordination of care.  Follow-Up Instructions: No follow-ups on file.   Lori Schroeder Neas, PA-C  Note - This record has been created using Dragon software.  Chart creation errors have been sought, but may not always  have been located. Such creation errors do not reflect on  the standard of medical care.

## 2021-03-07 DIAGNOSIS — M961 Postlaminectomy syndrome, not elsewhere classified: Secondary | ICD-10-CM | POA: Diagnosis not present

## 2021-03-07 DIAGNOSIS — M545 Low back pain, unspecified: Secondary | ICD-10-CM | POA: Diagnosis not present

## 2021-03-07 DIAGNOSIS — M47816 Spondylosis without myelopathy or radiculopathy, lumbar region: Secondary | ICD-10-CM | POA: Diagnosis not present

## 2021-03-16 ENCOUNTER — Encounter: Payer: Self-pay | Admitting: Physician Assistant

## 2021-03-16 ENCOUNTER — Other Ambulatory Visit: Payer: Self-pay

## 2021-03-16 ENCOUNTER — Ambulatory Visit: Payer: Medicare HMO | Admitting: Physician Assistant

## 2021-03-16 VITALS — BP 127/71 | HR 64 | Resp 16 | Ht <= 58 in | Wt 120.6 lb

## 2021-03-16 DIAGNOSIS — Z79899 Other long term (current) drug therapy: Secondary | ICD-10-CM

## 2021-03-16 DIAGNOSIS — M5136 Other intervertebral disc degeneration, lumbar region: Secondary | ICD-10-CM | POA: Diagnosis not present

## 2021-03-16 DIAGNOSIS — R5383 Other fatigue: Secondary | ICD-10-CM

## 2021-03-16 DIAGNOSIS — E1165 Type 2 diabetes mellitus with hyperglycemia: Secondary | ICD-10-CM

## 2021-03-16 DIAGNOSIS — I152 Hypertension secondary to endocrine disorders: Secondary | ICD-10-CM

## 2021-03-16 DIAGNOSIS — E1169 Type 2 diabetes mellitus with other specified complication: Secondary | ICD-10-CM | POA: Diagnosis not present

## 2021-03-16 DIAGNOSIS — M0609 Rheumatoid arthritis without rheumatoid factor, multiple sites: Secondary | ICD-10-CM

## 2021-03-16 DIAGNOSIS — M5030 Other cervical disc degeneration, unspecified cervical region: Secondary | ICD-10-CM | POA: Diagnosis not present

## 2021-03-16 DIAGNOSIS — Z8719 Personal history of other diseases of the digestive system: Secondary | ICD-10-CM | POA: Diagnosis not present

## 2021-03-16 DIAGNOSIS — E1159 Type 2 diabetes mellitus with other circulatory complications: Secondary | ICD-10-CM

## 2021-03-16 DIAGNOSIS — F17200 Nicotine dependence, unspecified, uncomplicated: Secondary | ICD-10-CM

## 2021-03-16 DIAGNOSIS — E785 Hyperlipidemia, unspecified: Secondary | ICD-10-CM

## 2021-03-16 DIAGNOSIS — M810 Age-related osteoporosis without current pathological fracture: Secondary | ICD-10-CM

## 2021-03-16 MED ORDER — HYDROXYCHLOROQUINE SULFATE 200 MG PO TABS: 200.0000 mg | ORAL_TABLET | Freq: Every day | ORAL | 0 refills | Status: DC

## 2021-03-16 NOTE — Progress Notes (Signed)
Office Visit Note  Patient: Lori Schroeder             Date of Birth: Jan 13, 1956           MRN: [id number]             PCP: [name], DO Referring: [name], DO Visit Date: 03/16/2021 Occupation: @GUAROCC @  Subjective:  Discuss treatment options   History of Present Illness: Glennda Weatherholtz is a 65 y.o. female with history of seronegative rheumatoid arthritis, DDD, and osteoporosis.  Patient presents today to discuss lab work and x-ray results from her initial office visit.  She reports that she continues to have pain and swelling in both hands and both wrist joints.  She continues to see pain management for management of chronic neck and lower back pain.  She denies any other joint pain or joint swelling at this time.  She denies any new concerns.     Activities of Daily Living:  Patient reports morning stiffness for 1  hour.   Patient Reports nocturnal pain.  Difficulty dressing/grooming: Denies Difficulty climbing stairs: Denies Difficulty getting out of chair: Denies Difficulty using hands for taps, buttons, cutlery, and/or writing: Reports  Review of Systems  Constitutional:  Positive for fatigue.  HENT:  Negative for mouth sores, mouth dryness and nose dryness.   Eyes:  Negative for pain, itching and dryness.  Respiratory:  Positive for shortness of breath and difficulty breathing.   Cardiovascular:  Negative for chest pain and palpitations.  Gastrointestinal:  Negative for blood in stool, constipation and diarrhea.  Endocrine: Negative for increased urination.  Genitourinary:  Negative for difficulty urinating.  Musculoskeletal:  Positive for joint pain, joint pain, joint swelling and morning stiffness. Negative for myalgias, muscle tenderness and myalgias.  Skin:  Negative for color change, rash and redness.  Allergic/Immunologic: Negative for susceptible to infections.  Neurological:  Negative for dizziness, numbness, headaches, memory loss and weakness.   Hematological:  Positive for bruising/bleeding tendency.  Psychiatric/Behavioral:  Negative for confusion.    PMFS History:  Patient Active Problem List   Diagnosis Date Noted   Osteoporosis 02/01/2021   Type 2 diabetes mellitus with hyperglycemia ([place]) 01/08/2018   CTS (carpal tunnel syndrome) 01/05/2017   Tobacco use disorder 09/15/2016   Chronic back pain 02/01/2016   Allergic rhinitis due to pollen 02/01/2016   BMI 26.0-26.9,adult 10/29/2015   Hyperlipidemia associated with type 2 diabetes mellitus ([place]) 10/14/2010   Hypertension associated with diabetes ([place]) 10/14/2010   GERD 10/19/2007    Past Medical History:  Diagnosis Date   Blood transfusion without reported diagnosis    External hemorrhoids without mention of complication [id number]   Colonoscopy   Family history of malignant neoplasm of gastrointestinal tract    Family history of malignant neoplasm of gastrointestinal tract    GERD (gastroesophageal reflux disease)    Hyperlipemia    Hypertension     Family History  Problem Relation Age of Onset   Colon cancer Maternal Grandmother    Arthritis Maternal Grandmother    Alzheimer's disease Mother    Hyperlipidemia Mother    Hypertension Mother    Cancer Father    Alcohol abuse Father    Diabetes Daughter        Type 1   Diabetes Son    Breast cancer Neg Hx    Celiac disease Neg Hx    Cirrhosis Neg Hx    Clotting disorder Neg Hx    Colitis Neg Hx  Colon polyps Neg Hx    Crohn's disease Neg Hx    Cystic fibrosis Neg Hx    Esophageal cancer Neg Hx    Heart disease Neg Hx    Hemochromatosis Neg Hx    Inflammatory bowel disease Neg Hx    Irritable bowel syndrome Neg Hx    Kidney disease Neg Hx    Liver cancer Neg Hx    Liver disease Neg Hx    Ovarian cancer Neg Hx    Pancreatic cancer Neg Hx    Prostate cancer Neg Hx    Rectal cancer Neg Hx    Stomach cancer Neg Hx    Ulcerative colitis Neg Hx    Uterine cancer Neg Hx    Wilson's disease Neg Hx     Past Surgical History:  Procedure Laterality Date   ABDOMINAL HYSTERECTOMY     BACK SURGERY     BLADDER SURGERY     Bladder Tact   CARPAL TUNNEL RELEASE Bilateral    COLONOSCOPY     FINGER SURGERY Right    Small finger fracture surgery   NECK SURGERY     Social History   Social History Narrative   Not on file   Immunization History  Administered Date(s) Administered   Influenza-Unspecified 10/02/2018   Pneumococcal Conjugate-13 05/29/2018   Tdap 05/29/2018     Objective: Vital Signs: BP 127/71 (BP Location: Left Arm, Patient Position: Sitting, Cuff Size: Normal)   Pulse 64   Resp 16   Ht 4' 9.5" (1.461 m)   Wt 120 lb 9.6 oz (54.7 kg)   BMI 25.65 kg/m    Physical Exam Vitals and nursing note reviewed.  Constitutional:      Appearance: She is well-developed.  HENT:     Head: Normocephalic and atraumatic.  Eyes:     Conjunctiva/sclera: Conjunctivae normal.  Pulmonary:     Effort: Pulmonary effort is normal.  Abdominal:     Palpations: Abdomen is soft.  Musculoskeletal:     Cervical back: Normal range of motion.  Skin:    General: Skin is warm and dry.     Capillary Refill: Capillary refill takes less than 2 seconds.  Neurological:     Mental Status: She is alert and oriented to person, place, and time.  Psychiatric:        Behavior: Behavior normal.     Musculoskeletal Exam: Painful range of motion C-spine, thoracic spine, lumbar spine.  Shoulder joints have good range of motion with some stiffness bilaterally.  Elbow joints have good range of motion with no tenderness or inflammation.  Painful range of motion of both wrist joints.  Tenderness over bilateral CMC joints. Tenderness and synovitis of bilateral 1st, 2nd, and3rdMCP joints.  Tenderness and synovitis of right 2nd and 3rd PIPs and left 5th PIP joint. Left hip has slightly limited ROM.  Knee joints good ROM with no warmth or effusion.  Ankle joints good ROM with no tenderness or joint swelling.    CDAI Exam: CDAI Score: 18.8  Patient Global: 4 mm; Provider Global: 4 mm Swollen: 9 ; Tender: 11  Joint Exam 03/16/2021      Right  Left  CMC   Tender   Tender  MCP 1  Swollen Tender  Swollen Tender  MCP 2  Swollen Tender  Swollen Tender  MCP 3  Swollen Tender  Swollen Tender  PIP 2  Swollen Tender     PIP 3  Swollen Tender     PIP 5  Swollen Tender     Investigation: No additional findings.  Imaging: No results found.  Recent Labs: Lab Results  Component Value Date   WBC 10.8 11/18/2020   HGB 13.4 11/18/2020   PLT 369 11/18/2020   NA 139 02/02/2021   K 4.7 02/02/2021   CL 103 02/02/2021   CO2 24 02/02/2021   GLUCOSE 103 (H) 02/02/2021   BUN 7 02/02/2021   CREATININE 0.61 02/02/2021   BILITOT 0.3 02/02/2021   ALKPHOS 117 02/03/2020   AST 16 02/02/2021   ALT 16 02/02/2021   PROT 7.0 02/02/2021   PROT 7.0 02/02/2021   ALBUMIN 4.3 02/03/2020   CALCIUM 10.1 02/02/2021   GFRAA 112 02/02/2021   QFTBGOLDPLUS NEGATIVE 02/02/2021    Speciality Comments: No specialty comments available.  Procedures:  No procedures performed Allergies: Patient has no known allergies.   Assessment / Plan:     Visit Diagnoses: Rheumatoid arthritis of multiple sites with negative rheumatoid factor ([place]): RF-, Anti-CCP-, 14-3-3 eta negative: She has tenderness and synovitis over bilateral first, second, and third MCP joints.  Tenderness and synovitis of the right second and third PIPs and left fifth PIP joint noted.  X-rays of both hands were consistent with osteoarthritic changes.  No erosions were noted on 02/02/2021.  Lab work from 02/02/2021 was reviewed with the patient today in the office and all questions were addressed.  Different treatment options were discussed today.  Indications, contraindications, potential side effects of Plaquenil were discussed today in detail.  All questions were addressed and consent was obtained.  She will start on plaquenil 200 mg 1 tablet by mouth  daily. She was advised to schedule a baseline Plaquenil eye exam.  She will follow-up for lab work in 1 month, 3 months, then every 5 months to monitor for drug toxicity.  She was advised to notify us if she cannot tolerate taking Plaquenil.  She will follow-up in the office in 8 weeks to assess her response.  Patient was counseled on the purpose, proper use, and adverse effects of hydroxychloroquine including nausea/diarrhea, skin rash, headaches, and sun sensitivity.  Discussed importance of annual eye exams while on hydroxychloroquine to monitor to ocular toxicity and discussed importance of frequent laboratory monitoring.  Provided patient with eye exam form for baseline ophthalmologic exam.  Provided patient with educational materials on hydroxychloroquine and answered all questions.  Patient consented to hydroxychloroquine.  Will upload consent in the media tab.    Dose will be Plaquenil 200 mg once daily.  Prescription pending lab results.  High risk medication use: She will be starting on plaquenil 200 mg 1 tablet by mouth daily. She was given a PLQ eye exam form to take with her to her upcoming appointment. She will  return for lab work in 1 month, 3 months, then every 5 months.  Standing orders for CBC and CMP replaced today. Data Baseline immunosuppressive labs on 02/02/2021. She is not a good candidate for prednisone due to diabetes.   DDD (degenerative disc disease), cervical: Status post fusion 2017 by Dr. Collie Siad at [facility]. Chronic pain.  She has painful limited range of motion.  She is followed closely by pain management.  She uses butrans patches for pain relief.   DDD (degenerative disc disease), lumbar: Status post fusion X3 by Dr. Maxie Better. Chronic pain. Using butrans patch for pain relief.  Followed by pain management.   Age-related osteoporosis without current pathological fracture: DEXA ordered by Dr. Wendi Snipes.   Other medical conditions  are listed as follows:    Hypertension associated with diabetes ([place])  Hyperlipidemia associated with type 2 diabetes mellitus ([place])  Type 2 diabetes mellitus with hyperglycemia, without long-term current use of insulin ([facility])  History of gastroesophageal reflux (GERD)  Tobacco use disorder: 2 PPDx 8 years, 1/2 PPDx 6 mths.  Other fatigue: CK WNL  Orders: Orders Placed This Encounter  Procedures   CBC with Differential/Platelet   COMPLETE METABOLIC PANEL WITH GFR    Meds ordered this encounter  Medications   hydroxychloroquine (PLAQUENIL) 200 MG tablet    Sig: Take 1 tablet (200 mg total) by mouth daily.    Dispense:  90 tablet    Refill:  0     Follow-Up Instructions: Return in about 8 weeks (around 05/11/2021) for Rheumatoid arthritis.   [name] Neas, PA-C  Note - This record has been created using Dragon software.  Chart creation errors have been sought, but may not always  have been located. Such creation errors do not reflect on  the standard of medical care.

## 2021-03-16 NOTE — Progress Notes (Signed)
Pharmacy Note  Subjective: Patient presents today to [facility] for follow up office visit.   Patient seen by the pharmacist for counseling on hydroxychloroquine for seronegative rheumatoid arthritis.  She does not have any prior therapy.  Objective: CMP     Component Value Date/Time   NA 139 02/02/2021 1015   NA 141 11/30/2020 0804   K 4.7 02/02/2021 1015   CL 103 02/02/2021 1015   CO2 24 02/02/2021 1015   GLUCOSE 103 (H) 02/02/2021 1015   BUN 7 02/02/2021 1015   BUN 6 (L) 11/30/2020 0804   CREATININE 0.61 02/02/2021 1015   CALCIUM 10.1 02/02/2021 1015   PROT 7.0 02/02/2021 1015   PROT 7.0 02/02/2021 1015   PROT 6.6 02/03/2020 1037   ALBUMIN 4.3 02/03/2020 1037   AST 16 02/02/2021 1015   ALT 16 02/02/2021 1015   ALKPHOS 117 02/03/2020 1037   BILITOT 0.3 02/02/2021 1015   BILITOT <0.2 02/03/2020 1037   GFRNONAA 96 02/02/2021 1015   GFRAA 112 02/02/2021 1015    CBC    Component Value Date/Time   WBC 10.8 11/18/2020 1650   WBC 10.4 11/18/2010 0939   RBC 4.50 11/18/2020 1650   RBC 4.52 11/18/2010 0939   HGB 13.4 11/18/2020 1650   HCT 39.6 11/18/2020 1650   PLT 369 11/18/2020 1650   MCV 88 11/18/2020 1650   MCH 29.8 11/18/2020 1650   MCHC 33.8 11/18/2020 1650   MCHC 33.9 11/18/2010 0939   RDW 14.8 11/18/2020 1650   LYMPHSABS 2.9 11/18/2020 1650   MONOABS 0.5 11/18/2010 0939   EOSABS 0.2 11/18/2020 1650   BASOSABS 0.1 11/18/2020 1650    Assessment/Plan: Patient was counseled on the purpose, proper use, and adverse effects of hydroxychloroquine including nausea/diarrhea, skin rash, headaches, and sun sensitivity.  Discussed importance of annual eye exams while on hydroxychloroquine to monitor to ocular toxicity and discussed importance of frequent laboratory monitoring.  Provided patient with eye exam form for baseline ophthalmologic exam and standing lab instructions.  Advised her to ensure her ophthalmologist/optometrist is able to perform OCT exam for  hydroxychloroquine monitoring.  Provided patient with educational materials on hydroxychloroquine and answered all questions.  Patient consented to hydroxychloroquine.  Will upload consent in the media tab.    Dose will be Plaquenil 200 mg once daily.   Knox Saliva, PharmD, MPH Clinical Pharmacist (Rheumatology and Pulmonology)

## 2021-03-16 NOTE — Patient Instructions (Signed)
Standing Labs We placed an order today for your standing lab work.   Please have your standing labs drawn in 1 month, 3  months, and every 5 months   If possible, please have your labs drawn 2 weeks prior to your appointment so that the provider can discuss your results at your appointment.  Please note that you may see your imaging and lab results in [facility] before we have reviewed them. We may be awaiting multiple results to interpret others before contacting you. Please allow our office up to 72 hours to thoroughly review all of the results before contacting the office for clarification of your results.  We have open lab daily: Monday through Thursday from 1:30-4:30 PM and Friday from 1:30-4:00 PM at the office of Dr. Bo Merino, [facility].   Please be advised, all patients with office appointments requiring lab work will take precedent over walk-in lab work.  If possible, please come for your lab work on Monday and Friday afternoons, as you may experience shorter wait times. The office is located at [address] [place], [place], [place] [address] No appointment is necessary.   Labs are drawn by [facility]. Please bring your co-pay at the time of your lab draw.  You may receive a bill from [facility] for your lab work.  If you wish to have your labs drawn at another location, please call the office 24 hours in advance to send orders.  If you have any questions regarding directions or hours of operation,  please call [phone].   As a reminder, please drink plenty of water prior to coming for your lab work. Thanks!   Hydroxychloroquine tablets What is this medication? HYDROXYCHLOROQUINE (hye drox ee KLOR oh kwin) is used to treat rheumatoidarthritis and systemic lupus erythematosus. It is also used to treat malaria. This medicine may be used for other purposes; ask your health care provider orpharmacist if you have questions. COMMON BRAND NAME(S): Plaquenil,  Quineprox What should I tell my care team before I take this medication? They need to know if you have any of these conditions: diabetes eye disease, vision problems G6PD deficiency heart disease history of irregular heartbeat if you often drink alcohol kidney disease liver disease porphyria psoriasis an unusual or allergic reaction to chloroquine, hydroxychloroquine, other medicines, foods, dyes, or preservatives pregnant or trying to get pregnant breast-feeding How should I use this medication? Take this medicine by mouth with a glass of water. Take it as directed on the prescription label. Do not cut, crush or chew this medicine. Swallow the tablets whole. Take it with food. Do not take it more than directed. Take all of this medicine unless your health care provider tells you to stop it early.Keep taking it even if you think you are better. Take products with antacids in them at a different time of day than this medicine. Take this medicine 4 hours before or 4 hours after antacids. Talk toyour health care provider if you have questions. Talk to your pediatrician regarding the use of this medicine in children. Whilethis drug may be prescribed for selected conditions, precautions do apply. Overdosage: If you think you have taken too much of this medicine contact apoison control center or emergency room at once. NOTE: This medicine is only for you. Do not share this medicine with others. What if I miss a dose? If you miss a dose, take it as soon as you can. If it is almost time for yournext dose, take only that  dose. Do not take double or extra doses. What may interact with this medication? Do not take this medicine with any of the following medications: cisapride dronedarone pimozide thioridazine This medicine may also interact with the following medications: ampicillin antacids cimetidine cyclosporine digoxin kaolin medicines for diabetes, like insulin, glipizide,  glyburide medicines for seizures like carbamazepine, phenobarbital, phenytoin mefloquine methotrexate other medicines that prolong the QT interval (cause an abnormal heart rhythm) praziquantel This list may not describe all possible interactions. Give your health care provider a list of all the medicines, herbs, non-prescription drugs, or dietary supplements you use. Also tell them if you smoke, drink alcohol, or use illegaldrugs. Some items may interact with your medicine. What should I watch for while using this medication? Visit your health care provider for regular checks on your progress. Tell your health care provider if your symptoms do not start to get better or if they getworse. You may need blood work done while you are taking this medicine. If you take other medicines that can affect heart rhythm, you may need more testing. Talkto your health care provider if you have questions. Your vision may be tested before and during use of this medicine. Tell yourhealth care provider right away if you have any change in your eyesight. This medicine may cause serious skin reactions. They can happen weeks to months after starting the medicine. Contact your health care provider right away if you notice fevers or flu-like symptoms with a rash. The rash may be red or purple and then turn into blisters or peeling of the skin. Or, you might notice a red rash with swelling of the face, lips or lymph nodes in your neck or underyour arms. If you or your family notice any changes in your behavior, such as new or worsening depression, thoughts of harming yourself, anxiety, or other unusual or disturbing thoughts, or memory loss, call your health care provider rightaway. What side effects may I notice from receiving this medication? Side effects that you should report to your doctor or health care professionalas soon as possible: allergic reactions (skin rash, itching or hives; swelling of the face, lips, or  tongue) changes in vision decreased hearing, ringing in the ears heartbeat rhythm changes (trouble breathing; chest pain; dizziness; fast, irregular heartbeat; feeling faint or lightheaded, falls) liver injury (dark yellow or brown urine; general ill feeling or flu-like symptoms; loss of appetite, right upper belly pain; unusually weak or tired, yellowing of the eyes or skin) low blood sugar (feeling anxious; confusion; dizziness; increased hunger; unusually weak or tired; increased sweating; shakiness; cold, clammy skin; irritable; headache; blurred vision; fast heartbeat; loss of consciousness) low red blood cell counts (trouble breathing; feeling faint; lightheaded, falls; unusually weak or tired) muscle weakness pain, tingling, numbness in the hands or feet rash, fever, and swollen lymph nodes redness, blistering, peeling or loosening of the skin, including inside the mouth suicidal thoughts, mood changes uncontrollable head, mouth, neck, arm, or leg movements unusual bruising or bleeding Side effects that usually do not require medical attention (report to yourdoctor or health care professional if they continue or are bothersome): diarrhea hair loss irritable This list may not describe all possible side effects. Call your doctor for medical advice about side effects. You may report side effects to FDA at1-800-FDA-1088. Where should I keep my medication? Keep out of the reach of children and pets. Store at room temperature up to 30 degrees C (86 degrees F). Protect fromlight. Get rid of any unused medicine after  the expiration date. To get rid of medicines that are no longer needed or have expired: Take the medicine to a medicine take-back program. Check with your pharmacy or law enforcement to find a location. If you cannot return the medicine, check the label or package insert to see if the medicine should be thrown out in the garbage or flushed down the toilet. If you are not sure, ask  your health care provider. If it is safe to put it in the trash, empty the medicine out of the container. Mix the medicine with cat litter, dirt, coffee grounds, or other unwanted substance. Seal the mixture in a bag or container. Put it in the trash. NOTE: This sheet is a summary. It may not cover all possible information. If you have questions about this medicine, talk to your doctor, pharmacist, orhealth care provider.  2022 Elsevier/Gold Standard (2020-03-08 15:07:49)

## 2021-03-28 ENCOUNTER — Telehealth: Payer: Self-pay | Admitting: *Deleted

## 2021-03-28 NOTE — Telephone Encounter (Signed)
Patient states she was started on PLQ and she has broken out in a rash from head to toe. Patient states she has discontinued the medication and has an appointment with her PCP tomorrow. Patient states she wants to cancel her appointment and will only see her PCP. Appointment cancelled in the computer.

## 2021-03-28 NOTE — Telephone Encounter (Signed)
She did the right thing by discontinuing the Plaquenil as she developed a reaction to Plaquenil.  If she develops swelling again then she should schedule an appointment to discuss other treatment options.

## 2021-03-29 ENCOUNTER — Ambulatory Visit (INDEPENDENT_AMBULATORY_CARE_PROVIDER_SITE_OTHER): Payer: Medicare HMO | Admitting: Family Medicine

## 2021-03-29 ENCOUNTER — Encounter: Payer: Self-pay | Admitting: Family Medicine

## 2021-03-29 ENCOUNTER — Other Ambulatory Visit: Payer: Self-pay

## 2021-03-29 VITALS — BP 137/65 | HR 84 | Temp 97.6°F | Wt 117.6 lb

## 2021-03-29 DIAGNOSIS — L270 Generalized skin eruption due to drugs and medicaments taken internally: Secondary | ICD-10-CM | POA: Diagnosis not present

## 2021-03-29 DIAGNOSIS — M810 Age-related osteoporosis without current pathological fracture: Secondary | ICD-10-CM | POA: Diagnosis not present

## 2021-03-29 DIAGNOSIS — E785 Hyperlipidemia, unspecified: Secondary | ICD-10-CM

## 2021-03-29 DIAGNOSIS — E1159 Type 2 diabetes mellitus with other circulatory complications: Secondary | ICD-10-CM | POA: Diagnosis not present

## 2021-03-29 DIAGNOSIS — I152 Hypertension secondary to endocrine disorders: Secondary | ICD-10-CM | POA: Diagnosis not present

## 2021-03-29 DIAGNOSIS — E1169 Type 2 diabetes mellitus with other specified complication: Secondary | ICD-10-CM | POA: Diagnosis not present

## 2021-03-29 LAB — BAYER DCA HB A1C WAIVED: HB A1C (BAYER DCA - WAIVED): 6.3 % (ref <7.0)

## 2021-03-29 MED ORDER — PREDNISONE 10 MG (21) PO TBPK: ORAL_TABLET | ORAL | 0 refills | Status: DC

## 2021-03-29 MED ORDER — HYDROXYZINE HCL 25 MG PO TABS: 12.5000 mg | ORAL_TABLET | Freq: Three times a day (TID) | ORAL | 0 refills | Status: DC | PRN

## 2021-03-29 MED ORDER — METHYLPREDNISOLONE ACETATE 40 MG/ML IJ SUSP
40.0000 mg | Freq: Once | INTRAMUSCULAR | Status: AC
  Administered 2021-03-29: 80 mg via INTRAMUSCULAR

## 2021-03-29 MED ORDER — METHYLPREDNISOLONE ACETATE 80 MG/ML IJ SUSP: 80.0000 mg | Freq: Once | INTRAMUSCULAR | Status: DC

## 2021-03-29 NOTE — Patient Instructions (Signed)
If you decide you want a second opinion for the arthritis, let me know.  Drug Rash A drug rash occurs when a medicine causes a change in the color or texture of the skin. It can develop minutes, hours, or days after you take the medicine.The rash may appear on a small area of skin or all over your body. What are the causes? This condition is usually caused by your body's reaction (allergy) to a medicine. It can also be caused by exposure to sunlight after taking amedicine that makes your skin sensitive to light. Though any medicine can cause a rash or reaction, medicines that are more likely to cause rashes include: Penicillin. Antibiotic medicines. Medicines that treat seizures. Medicines that treat cancer (chemotherapy). Aspirin and other NSAIDs. Injectable dyes that contain iodine. Insulin. What are the signs or symptoms? Symptoms of this condition include: Redness. Tiny bumps. Peeling. Itching. Itchy welts (hives). Swelling. How is this diagnosed? This condition may be diagnosed based on: A physical exam. Tests to find out which medicine caused the rash. These tests may include: Skin tests. Blood tests. Challenge test. For this test, you stop taking all the medicines that you do not need to take. Then, you start taking them again by adding back one medicine at a time. How is this treated? This condition is treated with medicines, including: Antihistamine. This may be given to relieve itching. NSAIDs. These may be given to reduce swelling and to treat pain. A steroid medicine. This may be given to reduce swelling. The rash usually goes away when you stop taking the medicine that caused it. Follow these instructions at home: Take over-the-counter and prescription medicines only as told by your health care provider. Tell all your health care providers about any medicine reactions that you have had in the past. If your rash was caused by sensitivity to sunlight, and while your  rash is healing: Avoid being in the sun if possible, especially when it is strongest, usually between 10 a.m. and 4 p.m. Cover your skin with pants, long sleeves, and a hat when you are exposed to sunlight. If you have hives: Take a cool shower or use a cool compress to relieve itchiness. Take over-the-counter antihistamines, as recommended by your health care provider, until the hives are gone. Hives are not contagious. Keep all follow-up visits as told by your health care provider. This is important. Contact a health care provider if you have: A fever. A rash that is not going away. A rash that gets worse. A rash that comes back. Wheezing or coughing. Get help right away if: You start to have breathing problems. You start to have shortness of breath. Your face or throat starts to swell. You have severe weakness with dizziness or fainting. You have chest pain. These symptoms may represent a serious problem that is an emergency. Do not wait to see if the symptoms will go away. Get medical help right away. Call your local emergency services (911 in the [place] Do not drive yourself to the hospital. Summary A drug rash occurs when a medicine causes a change in the color or texture of the skin. The rash may appear on a small area of skin or all over your body. It can develop minutes, hours, or days after you take the medicine. Your health care provider will do various tests to determine what medicine caused your rash. The rash may be treated with medicine to relieve itching, swelling, and pain. This information is not intended to  replace advice given to you by your health care provider. Make sure you discuss any questions you have with your healthcare provider. Document Revised: 12/31/2019 Document Reviewed: 12/31/2019 Elsevier Patient Education  2022 [employer].

## 2021-03-29 NOTE — Progress Notes (Signed)
Subjective: CC: DM PCP: Lori Norlander, DO [facility]P Albarracin is a 65 y.o. female presenting to clinic today for:  1. Type 2 Diabetes with hyperlipidemia:  She reports compliance with her Trulicity.  She is getting patient assistance for this and is thrilled about that.  Saving her several $100 per month.  She is compliant with Lipitor.  Last eye exam: UTD Last foot exam: UTD Last A1c:  Lab Results  Component Value Date   HGBA1C 6.8 11/30/2020   Nephropathy screen indicated?: needs Last flu, zoster and/or pneumovax:  Immunization History  Administered Date(s) Administered   Influenza-Unspecified 10/02/2018   Pneumococcal Conjugate-13 05/29/2018   Tdap 05/29/2018    ROS: No reports of chest pain, shortness of breath, nausea, vomiting or constipation.  She did have some diarrhea with plaquenil over the last week and a half.  2.  Rash Patient reports starting hydroxychloroquine about a week and a half ago for her polyarthralgia.  She notes that she developed a drug reaction over the last couple of days.  Its essentially spread from her trunk into her upper extremities.  Denies any recent tick bites or other infectious symptoms.  She has contacted her rheumatologist who advised her to discontinue this.  ROS: Per HPI  No Known Allergies Past Medical History:  Diagnosis Date   Blood transfusion without reported diagnosis    External hemorrhoids without mention of complication [id number]   Colonoscopy   Family history of malignant neoplasm of gastrointestinal tract    Family history of malignant neoplasm of gastrointestinal tract    GERD (gastroesophageal reflux disease)    Hyperlipemia    Hypertension     Current Outpatient Medications:    albuterol (VENTOLIN HFA) 108 (90 Base) MCG/ACT inhaler, 2 PUFFS EVERY 6 HOURS AS NEEDED FOR WHEEZING OR SHORTNESS OF BREATH, Disp: 8.5 g, Rfl: 2   atorvastatin (LIPITOR) 10 MG tablet, TAKE 1 TABLET EVERY DAY, Disp: 90 tablet, Rfl: 1    Budeson-Glycopyrrol-Formoterol (BREZTRI AEROSPHERE) 160-9-4.8 MCG/ACT AERO, Inhale 2 puffs into the lungs in the morning and at bedtime., Disp: 10.7 g, Rfl: 0   buprenorphine (BUTRANS) 10 MCG/HR PTWK, Place onto the skin., Disp: , Rfl:    esomeprazole (NEXIUM) 40 MG capsule, TAKE (1) CAPSULE TWICE DAILY., Disp: 60 capsule, Rfl: 5   fluticasone (FLONASE) 50 MCG/ACT nasal spray, Place 2 sprays into both nostrils daily., Disp: 16 g, Rfl: 6   hydroxychloroquine (PLAQUENIL) 200 MG tablet, Take 1 tablet (200 mg total) by mouth daily., Disp: 90 tablet, Rfl: 0   naloxone (NARCAN) nasal spray 4 mg/0.1 mL, Place 1 spray into the nose once., Disp: , Rfl:    pregabalin (LYRICA) 300 MG capsule, Take 300 mg by mouth at bedtime., Disp: , Rfl:    pregabalin (LYRICA) 50 MG capsule, Take 100 mg by mouth daily. At suppertime, Disp: , Rfl:    tiZANidine (ZANAFLEX) 4 MG tablet, TAKE ONE TABLET BY MOUTH THREE TIMES DAILY AS NEEDED FOR MUSCLE SPASM, Disp: , Rfl:    TRULICITY 1.5 IR/4.4RX SOPN, Inject 1.5 mg into the skin once a week., Disp: 2 mL, Rfl: 4 Social History   Socioeconomic History   Marital status: Married    Spouse name: Not on file   Number of children: 2   Years of education: 14   Highest education level: Associate degree: occupational, Hotel manager, or vocational program  Occupational History   Occupation: disability    Employer: DISABLED  Tobacco Use   Smoking status: Every Day  Packs/day: 0.50    Years: 10.00    Pack years: 5.00    Types: Cigarettes   Smokeless tobacco: Never  Vaping Use   Vaping Use: Never used  Substance and Sexual Activity   Alcohol use: No   Drug use: No   Sexual activity: Not on file  Other Topics Concern   Not on file  Social History Narrative   Not on file   Social Determinants of Health   Financial Resource Strain: Not on file  Food Insecurity: Not on file  Transportation Needs: Not on file  Physical Activity: Not on file  Stress: Not on file  Social  Connections: Not on file  Intimate Partner Violence: Not on file   Family History  Problem Relation Age of Onset   Colon cancer Maternal Grandmother    Arthritis Maternal Grandmother    Alzheimer's disease Mother    Hyperlipidemia Mother    Hypertension Mother    Cancer Father    Alcohol abuse Father    Diabetes Daughter        Type 1   Diabetes Son    Breast cancer Neg Hx    Celiac disease Neg Hx    Cirrhosis Neg Hx    Clotting disorder Neg Hx    Colitis Neg Hx    Colon polyps Neg Hx    Crohn's disease Neg Hx    Cystic fibrosis Neg Hx    Esophageal cancer Neg Hx    Heart disease Neg Hx    Hemochromatosis Neg Hx    Inflammatory bowel disease Neg Hx    Irritable bowel syndrome Neg Hx    Kidney disease Neg Hx    Liver cancer Neg Hx    Liver disease Neg Hx    Ovarian cancer Neg Hx    Pancreatic cancer Neg Hx    Prostate cancer Neg Hx    Rectal cancer Neg Hx    Stomach cancer Neg Hx    Ulcerative colitis Neg Hx    Uterine cancer Neg Hx    Wilson's disease Neg Hx     Objective: Office vital signs reviewed. BP 137/65   Pulse 84   Temp 97.6 F (36.4 C)   Wt 117 lb 9.6 oz (53.3 kg)   SpO2 98%   BMI 25.01 kg/m   Physical Examination:  General: Awake, alert, well nourished, No acute distress HEENT: Normal; oropharynx is patent.  There is no sublingual mucosal swelling Cardio: regular rate and rhythm, S1S2 heard, no murmurs appreciated Pulm: clear to auscultation bilaterally, no wheezes, rhonchi or rales; normal work of breathing on room air MSK: She has arthritic changes noted to the hands bilaterally Skin: Blanching, erythematous maculopapular rash noted across the entire torso.  There are no vesicular lesions or bullae appreciated.  Assessment/ Plan: 65 y.o. female   Controlled type 2 diabetes mellitus with other specified complication, without long-term current use of insulin ([place]) - Plan: Bayer DCA Hb A1c Waived  Hypertension associated with diabetes  ([place])  Hyperlipidemia associated with type 2 diabetes mellitus ([place])  Age-related osteoporosis without current pathological fracture - Plan: VITAMIN D 25 Hydroxy (Vit-D Deficiency, Fractures)  Drug rash - Plan: predniSONE (STERAPRED UNI-PAK 21 TAB) 10 MG (21) TBPK tablet, hydrOXYzine (ATARAX/VISTARIL) 25 MG tablet, methylPREDNISolone acetate (DEPO-MEDROL) injection 40 mg, DISCONTINUED: methylPREDNISolone acetate (DEPO-MEDROL) injection 80 mg  Blood sugars under excellent control.  Continue current regimen with Trulicity.  Blood pressure is diet controlled.  Continue statin for hyperlipidemia  Check vitamin  D level given known osteoporosis  Clinically she appears to have a drug rash.  I agree with discontinuation of hydroxychloroquine.  She was given a intramuscular injection of Depo-Medrol 80 mg and will start prednisone orally tomorrow.  She understands that this will likely impact her sugar but we will keep a close eye on things.  I have also given her Atarax as needed itching.  Follow-up if no resolution or other worrisome findings/symptoms begin.  No orders of the defined types were placed in this encounter.  No orders of the defined types were placed in this encounter.    Lori Norlander, DO [place] [phone]

## 2021-03-30 ENCOUNTER — Other Ambulatory Visit: Payer: Self-pay | Admitting: Family Medicine

## 2021-03-30 DIAGNOSIS — M810 Age-related osteoporosis without current pathological fracture: Secondary | ICD-10-CM

## 2021-03-30 LAB — MICROALBUMIN / CREATININE URINE RATIO
Creatinine, Urine: 35.8 mg/dL
Microalb/Creat Ratio: <8 mg/g creat (ref 0–29)
Microalbumin, Urine: <3 ug/mL

## 2021-03-30 LAB — VITAMIN D 25 HYDROXY (VIT D DEFICIENCY, FRACTURES): Vit D, 25-Hydroxy: 27.9 ng/mL — ABNORMAL LOW (ref 30.0–100.0)

## 2021-03-30 MED ORDER — VITAMIN D (ERGOCALCIFEROL) 1.25 MG (50000 UNIT) PO CAPS: 50000.0000 [IU] | ORAL_CAPSULE | ORAL | 0 refills | Status: AC

## 2021-04-01 ENCOUNTER — Ambulatory Visit: Payer: Medicare HMO | Admitting: Family Medicine

## 2021-04-05 ENCOUNTER — Other Ambulatory Visit: Payer: Self-pay | Admitting: Family Medicine

## 2021-04-05 ENCOUNTER — Encounter: Payer: Self-pay | Admitting: Nurse Practitioner

## 2021-04-05 ENCOUNTER — Other Ambulatory Visit: Payer: Self-pay

## 2021-04-05 ENCOUNTER — Ambulatory Visit (INDEPENDENT_AMBULATORY_CARE_PROVIDER_SITE_OTHER): Payer: Medicare HMO | Admitting: Nurse Practitioner

## 2021-04-05 VITALS — BP 117/76 | HR 102 | Temp 98.0°F | Ht <= 58 in | Wt 117.0 lb

## 2021-04-05 DIAGNOSIS — L239 Allergic contact dermatitis, unspecified cause: Secondary | ICD-10-CM | POA: Insufficient documentation

## 2021-04-05 DIAGNOSIS — J441 Chronic obstructive pulmonary disease with (acute) exacerbation: Secondary | ICD-10-CM

## 2021-04-05 DIAGNOSIS — J069 Acute upper respiratory infection, unspecified: Secondary | ICD-10-CM

## 2021-04-05 MED ORDER — HYDROXYZINE PAMOATE 25 MG PO CAPS: 25.0000 mg | ORAL_CAPSULE | Freq: Three times a day (TID) | ORAL | 0 refills | Status: DC | PRN

## 2021-04-05 NOTE — Patient Instructions (Signed)
Contact Dermatitis Dermatitis is redness, soreness, and swelling (inflammation) of the skin. Contact dermatitis is a reaction to something that touches theskin. There are two types of contact dermatitis: Irritant contact dermatitis. This happens when something bothers (irritates) your skin, like soap. Allergic contact dermatitis. This is caused when you are exposed to something that you are allergic to, such as poison ivy. What are the causes? Common causes of irritant contact dermatitis include: Makeup. Soaps. Detergents. Bleaches. Acids. Metals, such as nickel. Common causes of allergic contact dermatitis include: Plants. Chemicals. Jewelry. Latex. Medicines. Preservatives in products, such as clothing. What increases the risk? Having a job that exposes you to things that bother your skin. Having asthma or eczema. What are the signs or symptoms? Symptoms may happen anywhere the irritant has touched your skin. Symptoms include: Dry or flaky skin. Redness. Cracks. Itching. Pain or a burning feeling. Blisters. Blood or clear fluid draining from skin cracks. With allergic contact dermatitis, swelling may occur. This may happen in placessuch as the eyelids, mouth, or genitals. How is this treated? This condition is treated by checking for the cause of the reaction and protecting your skin. Treatment may also include: Steroid creams, ointments, or medicines. Antibiotic medicines or other ointments, if you have a skin infection. Lotion or medicines to help with itching. A bandage (dressing). Follow these instructions at home: Skin care Moisturize your skin as needed. Put cool cloths on your skin. Put a baking soda paste on your skin. Stir water into baking soda until it looks like a paste. Do not scratch your skin. Avoid having things rub up against your skin. Avoid the use of soaps, perfumes, and dyes. Medicines Take or apply over-the-counter and prescription medicines  only as told by your doctor. If you were prescribed an antibiotic medicine, take or apply it as told by your doctor. Do not stop using it even if your condition starts to get better. Bathing Take a bath with: Epsom salts. Baking soda. Colloidal oatmeal. Bathe less often. Bathe in warm water. Avoid using hot water. Bandage care If you were given a bandage, change it as told by your health care provider. Wash your hands with soap and water before and after you change your bandage. If soap and water are not available, use hand sanitizer. General instructions Avoid the things that caused your reaction. If you do not know what caused it, keep a journal. Write down: What you eat. What skin products you use. What you drink. What you wear in the area that has symptoms. This includes jewelry. Check the affected areas every day for signs of infection. Check for: More redness, swelling, or pain. More fluid or blood. Warmth. Pus or a bad smell. Keep all follow-up visits as told by your doctor. This is important. Contact a doctor if: You do not get better with treatment. Your condition gets worse. You have signs of infection, such as: More swelling. Tenderness. More redness. Soreness. Warmth. You have a fever. You have new symptoms. Get help right away if: You have a very bad headache. You have neck pain. Your neck is stiff. You throw up (vomit). You feel very sleepy. You see red streaks coming from the area. Your bone or joint near the area hurts after the skin has healed. The area turns darker. You have trouble breathing. Summary Dermatitis is redness, soreness, and swelling of the skin. Symptoms may occur where the irritant has touched you. Treatment may include medicines and skin care. If you do not  know what caused your reaction, keep a journal. Contact a doctor if your condition gets worse or you have signs of infection. This information is not intended to replace advice  given to you by your health care provider. Make sure you discuss any questions you have with your healthcare provider. Document Revised: 01/08/2019 Document Reviewed: 04/03/2018 Elsevier Patient Education  [employer].

## 2021-04-05 NOTE — Assessment & Plan Note (Addendum)
Patient is reporting worsening skin rash with no resolution after using 10 mg tablet of Atarax.  On assessment patient has on a transdermal pain patch and around patches skin is red and raw.  This symptoms look like allergic contact dermatitis from transdermal Burenorphine.  Medication prescribed by patient's pain doctor.  I provided education to patient with printed handouts given on current medication.  Increase Atarax to 25 mg tablet by mouth.  Advised patient to return to pain clinic to reassess transdermal patch as this is the only new medication patient has on her list.  Patient verbalized understanding.  Printed handouts given.  Rx sent to pharmacy. Follow-up with worsening unresolved symptoms.

## 2021-04-05 NOTE — Progress Notes (Signed)
Acute Office Visit  Subjective:    Patient ID: Lori Schroeder, female    DOB: December 25, 1955, 65 y.o.   MRN: [id number]  Chief Complaint  Patient presents with   Rash    Rash This is a recurrent problem. The problem has been gradually worsening since onset. The affected locations include the left hand, right hand, back and torso. The rash is characterized by dryness, redness and itchiness. Associated with: possible pain patch. Pertinent negatives include no anorexia, congestion, cough, facial edema, fever, joint pain, rhinorrhea, shortness of breath or sore throat. Treatments tried: Atarax 10 mg tablet. The treatment provided no relief.    Past Medical History:  Diagnosis Date   Blood transfusion without reported diagnosis    External hemorrhoids without mention of complication [id number]   Colonoscopy   Family history of malignant neoplasm of gastrointestinal tract    Family history of malignant neoplasm of gastrointestinal tract    GERD (gastroesophageal reflux disease)    Hyperlipemia    Hypertension     Past Surgical History:  Procedure Laterality Date   ABDOMINAL HYSTERECTOMY     BACK SURGERY     BLADDER SURGERY     Bladder Tact   CARPAL TUNNEL RELEASE Bilateral    COLONOSCOPY     FINGER SURGERY Right    Small finger fracture surgery   NECK SURGERY      Family History  Problem Relation Age of Onset   Colon cancer Maternal Grandmother    Arthritis Maternal Grandmother    Alzheimer's disease Mother    Hyperlipidemia Mother    Hypertension Mother    Cancer Father    Alcohol abuse Father    Diabetes Daughter        Type 1   Diabetes Son    Breast cancer Neg Hx    Celiac disease Neg Hx    Cirrhosis Neg Hx    Clotting disorder Neg Hx    Colitis Neg Hx    Colon polyps Neg Hx    Crohn's disease Neg Hx    Cystic fibrosis Neg Hx    Esophageal cancer Neg Hx    Heart disease Neg Hx    Hemochromatosis Neg Hx    Inflammatory bowel disease Neg Hx    Irritable bowel  syndrome Neg Hx    Kidney disease Neg Hx    Liver cancer Neg Hx    Liver disease Neg Hx    Ovarian cancer Neg Hx    Pancreatic cancer Neg Hx    Prostate cancer Neg Hx    Rectal cancer Neg Hx    Stomach cancer Neg Hx    Ulcerative colitis Neg Hx    Uterine cancer Neg Hx    Wilson's disease Neg Hx     Social History   Socioeconomic History   Marital status: Married    Spouse name: Not on file   Number of children: 2   Years of education: 14   Highest education level: Associate degree: occupational, Hotel manager, or vocational program  Occupational History   Occupation: disability    Employer: DISABLED  Tobacco Use   Smoking status: Every Day    Packs/day: 0.50    Years: 10.00    Pack years: 5.00    Types: Cigarettes   Smokeless tobacco: Never  Vaping Use   Vaping Use: Never used  Substance and Sexual Activity   Alcohol use: No   Drug use: No   Sexual activity: Not on file  Other Topics  Concern   Not on file  Social History Narrative   Not on file   Social Determinants of Health   Financial Resource Strain: Not on file  Food Insecurity: Not on file  Transportation Needs: Not on file  Physical Activity: Not on file  Stress: Not on file  Social Connections: Not on file  Intimate Partner Violence: Not on file    Outpatient Medications Prior to Visit  Medication Sig Dispense Refill   albuterol (VENTOLIN HFA) 108 (90 Base) MCG/ACT inhaler 2 PUFFS EVERY 6 HOURS AS NEEDED FOR WHEEZING OR SHORTNESS OF BREATH 8.5 g 2   atorvastatin (LIPITOR) 10 MG tablet TAKE 1 TABLET EVERY DAY 90 tablet 1   Budeson-Glycopyrrol-Formoterol (BREZTRI AEROSPHERE) 160-9-4.8 MCG/ACT AERO Inhale 2 puffs into the lungs in the morning and at bedtime. 10.7 g 0   esomeprazole (NEXIUM) 40 MG capsule TAKE (1) CAPSULE TWICE DAILY. 60 capsule 5   fluticasone (FLONASE) 50 MCG/ACT nasal spray Place 2 sprays into both nostrils daily. 16 g 6   hydrOXYzine (ATARAX/VISTARIL) 25 MG tablet Take 0.5-1 tablets  (12.5-25 mg total) by mouth 3 (three) times daily as needed for itching. 30 tablet 0   naloxone (NARCAN) nasal spray 4 mg/0.1 mL Place 1 spray into the nose once.     predniSONE (STERAPRED UNI-PAK 21 TAB) 10 MG (21) TBPK tablet As directed x 6 days 21 tablet 0   pregabalin (LYRICA) 300 MG capsule Take 300 mg by mouth at bedtime.     pregabalin (LYRICA) 50 MG capsule Take 100 mg by mouth daily. At suppertime     tiZANidine (ZANAFLEX) 4 MG tablet TAKE ONE TABLET BY MOUTH THREE TIMES DAILY AS NEEDED FOR MUSCLE SPASM     TRULICITY 1.5 UX/3.2TF SOPN Inject 1.5 mg into the skin once a week. 2 mL 4   Vitamin D, Ergocalciferol, (DRISDOL) 1.25 MG (50000 UNIT) CAPS capsule Take 1 capsule (50,000 Units total) by mouth every 7 (seven) days for 12 doses. Then start OTC Vit D 400IU daily. 12 capsule 0   No facility-administered medications prior to visit.    Allergies  Allergen Reactions   Hydroxychloroquine Itching and Rash    Diffuse drug rash    Review of Systems  Constitutional: Negative.  Negative for fever.  HENT: Negative.  Negative for congestion, rhinorrhea and sore throat.   Respiratory: Negative.  Negative for cough and shortness of breath.   Gastrointestinal: Negative.  Negative for anorexia.  Musculoskeletal:  Negative for joint pain.  Skin:  Positive for color change and rash.  Neurological: Negative.       Objective:    Physical Exam Vitals and nursing note reviewed.  Constitutional:      Appearance: Normal appearance.  HENT:     Head: Normocephalic.     Right Ear: Ear canal and external ear normal.     Nose: Nose normal.     Mouth/Throat:     Mouth: Mucous membranes are moist.     Pharynx: Oropharynx is clear.  Eyes:     Conjunctiva/sclera: Conjunctivae normal.  Cardiovascular:     Pulses: Normal pulses.     Heart sounds: Normal heart sounds.  Pulmonary:     Effort: Pulmonary effort is normal.     Breath sounds: Normal breath sounds.  Abdominal:     General: Bowel  sounds are normal.  Skin:    General: Skin is dry.     Findings: Erythema and rash present.  Neurological:     Mental  Status: She is alert and oriented to person, place, and time.  Psychiatric:        Behavior: Behavior normal.    BP 117/76   Pulse (!) 102   Temp 98 F (36.7 C) (Temporal)   Ht _0  (1.448 m)   Wt 117 lb (53.1 kg)   SpO2 96%   BMI 25.32 kg/m  Wt Readings from Last 3 Encounters:  04/05/21 117 lb (53.1 kg)  03/29/21 117 lb 9.6 oz (53.3 kg)  03/16/21 120 lb 9.6 oz (54.7 kg)    Health Maintenance Due  Topic Date Due   PAP SMEAR-Modifier  07/06/2016    There are no preventive care reminders to display for this patient.   Lab Results  Component Value Date   TSH 1.430 02/03/2020   Lab Results  Component Value Date   WBC 10.8 11/18/2020   HGB 13.4 11/18/2020   HCT 39.6 11/18/2020   MCV 88 11/18/2020   PLT 369 11/18/2020   Lab Results  Component Value Date   NA 139 02/02/2021   K 4.7 02/02/2021   CO2 24 02/02/2021   GLUCOSE 103 (H) 02/02/2021   BUN 7 02/02/2021   CREATININE 0.61 02/02/2021   BILITOT 0.3 02/02/2021   ALKPHOS 117 02/03/2020   AST 16 02/02/2021   ALT 16 02/02/2021   PROT 7.0 02/02/2021   PROT 7.0 02/02/2021   ALBUMIN 4.3 02/03/2020   CALCIUM 10.1 02/02/2021   EGFR 100 11/30/2020   GFR 97.70 10/14/2010   Lab Results  Component Value Date   CHOL 155 02/03/2020   Lab Results  Component Value Date   HDL 54 02/03/2020   Lab Results  Component Value Date   LDLCALC 79 02/03/2020   Lab Results  Component Value Date   TRIG 126 02/03/2020   Lab Results  Component Value Date   CHOLHDL 2.9 02/03/2020   Lab Results  Component Value Date   HGBA1C 6.3 03/29/2021       Assessment & Plan:   Problem List Items Addressed This Visit       Musculoskeletal and Integument   Allergic dermatitis - Primary    Patient is reporting worsening skin rash with no resolution after using 10 mg tablet of Atarax.  On assessment  patient has on a transdermal pain patch and around patches skin is red and raw.  This symptoms look like allergic contact dermatitis from transdermal Burenorphine.  Medication prescribed by patient's pain doctor.  I provided education to patient with printed handouts given on current medication.  Increase Atarax to 25 mg tablet by mouth.  Advised patient to return to pain clinic to reassess transdermal patch as this is the only new medication patient has on her list.  Patient verbalized understanding.  Printed handouts given.  Rx sent to pharmacy. Follow-up with worsening unresolved symptoms.          Relevant Medications   hydrOXYzine (VISTARIL) 25 MG capsule     Meds ordered this encounter  Medications   hydrOXYzine (VISTARIL) 25 MG capsule    Sig: Take 1 capsule (25 mg total) by mouth every 8 (eight) hours as needed.    Dispense:  30 capsule    Refill:  0    Order Specific Question:   Supervising Provider    Answer:   [name] [[id number]]     Ivy Lynn, NP

## 2021-04-07 DIAGNOSIS — M545 Low back pain, unspecified: Secondary | ICD-10-CM | POA: Diagnosis not present

## 2021-04-07 DIAGNOSIS — Z5181 Encounter for therapeutic drug level monitoring: Secondary | ICD-10-CM | POA: Diagnosis not present

## 2021-04-07 DIAGNOSIS — Z79899 Other long term (current) drug therapy: Secondary | ICD-10-CM | POA: Diagnosis not present

## 2021-04-07 DIAGNOSIS — M961 Postlaminectomy syndrome, not elsewhere classified: Secondary | ICD-10-CM | POA: Diagnosis not present

## 2021-04-08 ENCOUNTER — Institutional Professional Consult (permissible substitution): Payer: Medicare HMO | Admitting: Internal Medicine

## 2021-04-08 NOTE — Progress Notes (Deleted)
   Lori Schroeder, female    DOB: 02-28-1956   MRN: [id number]   Brief patient profile:  27 yowf  *** referred to pulmonary clinic 04/08/2021 by Dr   Lemar Lofty for chronic bronchitis     History of Present Illness  04/08/2021  Pulmonary/ 1st office eval/Lori Schroeder  No chief complaint on file.    Dyspnea:  *** Cough: *** Sleep: *** SABA use:   Past Medical History:  Diagnosis Date   Blood transfusion without reported diagnosis    External hemorrhoids without mention of complication [id number]   Colonoscopy   Family history of malignant neoplasm of gastrointestinal tract    Family history of malignant neoplasm of gastrointestinal tract    GERD (gastroesophageal reflux disease)    Hyperlipemia    Hypertension     Outpatient Medications Prior to Visit  Medication Sig Dispense Refill   albuterol (VENTOLIN HFA) 108 (90 Base) MCG/ACT inhaler 2 PUFFS EVERY 6 HOURS AS NEEDED FOR WHEEZING OR SHORTNESS OF BREATH 8.5 g 4   atorvastatin (LIPITOR) 10 MG tablet TAKE 1 TABLET EVERY DAY 90 tablet 1   Budeson-Glycopyrrol-Formoterol (BREZTRI AEROSPHERE) 160-9-4.8 MCG/ACT AERO Inhale 2 puffs into the lungs in the morning and at bedtime. 10.7 g 0   esomeprazole (NEXIUM) 40 MG capsule TAKE (1) CAPSULE TWICE DAILY. 60 capsule 5   fluticasone (FLONASE) 50 MCG/ACT nasal spray Place 2 sprays into both nostrils daily. 16 g 6   hydrOXYzine (ATARAX/VISTARIL) 25 MG tablet Take 0.5-1 tablets (12.5-25 mg total) by mouth 3 (three) times daily as needed for itching. 30 tablet 0   hydrOXYzine (VISTARIL) 25 MG capsule Take 1 capsule (25 mg total) by mouth every 8 (eight) hours as needed. 30 capsule 0   naloxone (NARCAN) nasal spray 4 mg/0.1 mL Place 1 spray into the nose once.     predniSONE (STERAPRED UNI-PAK 21 TAB) 10 MG (21) TBPK tablet As directed x 6 days 21 tablet 0   pregabalin (LYRICA) 300 MG capsule Take 300 mg by mouth at bedtime.     pregabalin (LYRICA) 50 MG capsule Take 100 mg by mouth daily. At suppertime      tiZANidine (ZANAFLEX) 4 MG tablet TAKE ONE TABLET BY MOUTH THREE TIMES DAILY AS NEEDED FOR MUSCLE SPASM     TRULICITY 1.5 TR/3.2YE SOPN Inject 1.5 mg into the skin once a week. 2 mL 4   Vitamin D, Ergocalciferol, (DRISDOL) 1.25 MG (50000 UNIT) CAPS capsule Take 1 capsule (50,000 Units total) by mouth every 7 (seven) days for 12 doses. Then start OTC Vit D 400IU daily. 12 capsule 0   No facility-administered medications prior to visit.     Objective:     There were no vitals taken for this visit.      I personally reviewed images and agree with radiology impression as follows:  CXR:   11/18/20 Nl cxr     Assessment   No problem-specific Assessment & Plan notes found for this encounter.     Christinia Gully, MD 04/08/2021

## 2021-04-19 ENCOUNTER — Other Ambulatory Visit: Payer: Self-pay | Admitting: Family Medicine

## 2021-04-19 ENCOUNTER — Telehealth: Payer: Self-pay | Admitting: Family Medicine

## 2021-04-19 DIAGNOSIS — L270 Generalized skin eruption due to drugs and medicaments taken internally: Secondary | ICD-10-CM

## 2021-04-19 MED ORDER — PREDNISONE 10 MG (21) PO TBPK: ORAL_TABLET | ORAL | 0 refills | Status: DC

## 2021-04-19 NOTE — Telephone Encounter (Signed)
Rx sent.  If symptoms persist or do not resolve, next step is dermatology

## 2021-04-19 NOTE — Telephone Encounter (Signed)
Pt called stating that she either needs a refill on the Prednisone Rx she has been taking for a rash or she needs something else prescribed because she is out of medicine and still has rash. Pt says the Prednisone did help clear up the rash some but not completely.   Please advise and call patient.  Needs Rx sent to [facility].

## 2021-04-29 ENCOUNTER — Ambulatory Visit (INDEPENDENT_AMBULATORY_CARE_PROVIDER_SITE_OTHER): Payer: Medicare HMO

## 2021-04-29 VITALS — Ht <= 58 in | Wt 117.0 lb

## 2021-04-29 DIAGNOSIS — Z0000 Encounter for general adult medical examination without abnormal findings: Secondary | ICD-10-CM | POA: Diagnosis not present

## 2021-04-29 DIAGNOSIS — N951 Menopausal and female climacteric states: Secondary | ICD-10-CM | POA: Insufficient documentation

## 2021-04-29 DIAGNOSIS — Z1231 Encounter for screening mammogram for malignant neoplasm of breast: Secondary | ICD-10-CM

## 2021-04-29 DIAGNOSIS — E7800 Pure hypercholesterolemia, unspecified: Secondary | ICD-10-CM | POA: Insufficient documentation

## 2021-04-29 NOTE — Progress Notes (Signed)
Subjective:   Lori Schroeder is a 65 y.o. female who presents for Medicare Annual (Subsequent) preventive examination.  Virtual Visit via Telephone Note  I connected with  Lori Schroeder on 04/29/21 at  1:15 PM EDT by telephone and verified that I am speaking with the correct person using two identifiers.  Location: Patient: Home Provider: [facility] Persons participating in the virtual visit: patient/Nurse Health Advisor   I discussed the limitations, risks, security and privacy concerns of performing an evaluation and management service by telephone and the availability of in person appointments. The patient expressed understanding and agreed to proceed.  Interactive audio and video telecommunications were attempted between this nurse and patient, however failed, due to patient having technical difficulties OR patient did not have access to video capability.  We continued and completed visit with audio only.  Some vital signs may be absent or patient reported.   Lori Borys E Lealand Elting, LPN   Review of Systems     Cardiac Risk Factors include: diabetes mellitus;dyslipidemia;hypertension;smoking/ tobacco exposure     Objective:    Today's Vitals   04/29/21 1324  Weight: 117 lb (53.1 kg)  Height: '4\' 9"'$  (1.448 m)  PainSc: 5    Body mass index is 25.32 kg/m.  Advanced Directives 04/29/2021 04/28/2020 03/19/2019 01/18/2016  Does Patient Have a Medical Advance Directive? No No No No  Would patient like information on creating a medical advance directive? No - Patient declined No - Patient declined Yes (MAU/Ambulatory/Procedural Areas - Information given) -    Current Medications (verified) Outpatient Encounter Medications as of 04/29/2021  Medication Sig   albuterol (VENTOLIN HFA) 108 (90 Base) MCG/ACT inhaler 2 PUFFS EVERY 6 HOURS AS NEEDED FOR WHEEZING OR SHORTNESS OF BREATH   atorvastatin (LIPITOR) 10 MG tablet TAKE 1 TABLET EVERY DAY   Budeson-Glycopyrrol-Formoterol (BREZTRI AEROSPHERE)  160-9-4.8 MCG/ACT AERO Inhale 2 puffs into the lungs in the morning and at bedtime.   esomeprazole (NEXIUM) 40 MG capsule TAKE (1) CAPSULE TWICE DAILY.   fluticasone (FLONASE) 50 MCG/ACT nasal spray Place 2 sprays into both nostrils daily.   hydrOXYzine (ATARAX/VISTARIL) 25 MG tablet Take 0.5-1 tablets (12.5-25 mg total) by mouth 3 (three) times daily as needed for itching.   hydrOXYzine (VISTARIL) 25 MG capsule Take 1 capsule (25 mg total) by mouth every 8 (eight) hours as needed.   naloxone (NARCAN) nasal spray 4 mg/0.1 mL Place 1 spray into the nose once.   predniSONE (STERAPRED UNI-PAK 21 TAB) 10 MG (21) TBPK tablet As directed x 6 days   pregabalin (LYRICA) 300 MG capsule Take 300 mg by mouth at bedtime.   pregabalin (LYRICA) 50 MG capsule Take 100 mg by mouth daily. At suppertime   tiZANidine (ZANAFLEX) 4 MG tablet TAKE ONE TABLET BY MOUTH THREE TIMES DAILY AS NEEDED FOR MUSCLE SPASM   TRULICITY 1.5 0000000 SOPN Inject 1.5 mg into the skin once a week.   Vitamin D, Ergocalciferol, (DRISDOL) 1.25 MG (50000 UNIT) CAPS capsule Take 1 capsule (50,000 Units total) by mouth every 7 (seven) days for 12 doses. Then start OTC Vit D 400IU daily.   No facility-administered encounter medications on file as of 04/29/2021.    Allergies (verified) Hydroxychloroquine and Sulfa antibiotics   History: Past Medical History:  Diagnosis Date   Blood transfusion without reported diagnosis    External hemorrhoids without mention of complication [id number]   Colonoscopy   Family history of malignant neoplasm of gastrointestinal tract    Family history of malignant neoplasm of  gastrointestinal tract    GERD (gastroesophageal reflux disease)    Hyperlipemia    Hypertension    Past Surgical History:  Procedure Laterality Date   ABDOMINAL HYSTERECTOMY     BACK SURGERY     BLADDER SURGERY     Bladder Tact   CARPAL TUNNEL RELEASE Bilateral    COLONOSCOPY     FINGER SURGERY Right    Small finger  fracture surgery   NECK SURGERY     Family History  Problem Relation Age of Onset   Colon cancer Maternal Grandmother    Arthritis Maternal Grandmother    Alzheimer's disease Mother    Hyperlipidemia Mother    Hypertension Mother    Cancer Father    Alcohol abuse Father    Diabetes Daughter        Type 1   Diabetes Son    Breast cancer Neg Hx    Celiac disease Neg Hx    Cirrhosis Neg Hx    Clotting disorder Neg Hx    Colitis Neg Hx    Colon polyps Neg Hx    Crohn's disease Neg Hx    Cystic fibrosis Neg Hx    Esophageal cancer Neg Hx    Heart disease Neg Hx    Hemochromatosis Neg Hx    Inflammatory bowel disease Neg Hx    Irritable bowel syndrome Neg Hx    Kidney disease Neg Hx    Liver cancer Neg Hx    Liver disease Neg Hx    Ovarian cancer Neg Hx    Pancreatic cancer Neg Hx    Prostate cancer Neg Hx    Rectal cancer Neg Hx    Stomach cancer Neg Hx    Ulcerative colitis Neg Hx    Uterine cancer Neg Hx    Wilson's disease Neg Hx    Social History   Socioeconomic History   Marital status: Married    Spouse name: Timothy   Number of children: 2   Years of education: 14   Highest education level: Associate degree: occupational, Hotel manager, or vocational program  Occupational History   Occupation: disability    Employer: DISABLED  Tobacco Use   Smoking status: Every Day    Packs/day: 0.50    Years: 10.00    Pack years: 5.00    Types: Cigarettes   Smokeless tobacco: Never  Vaping Use   Vaping Use: Never used  Substance and Sexual Activity   Alcohol use: No   Drug use: No   Sexual activity: Not on file  Other Topics Concern   Not on file  Social History Narrative   Not on file   Social Determinants of Health   Financial Resource Strain: Low Risk    Difficulty of Paying Living Expenses: Not hard at all  Food Insecurity: No Food Insecurity   Worried About Charity fundraiser in the Last Year: Never true   Arboriculturist in the Last Year: Never true   Transportation Needs: Not on file  Physical Activity: Insufficiently Active   Days of Exercise per Week: 7 days   Minutes of Exercise per Session: 10 min  Stress: No Stress Concern Present   Feeling of Stress : Not at all  Social Connections: Moderately Integrated   Frequency of Communication with Friends and Family: More than three times a week   Frequency of Social Gatherings with Friends and Family: More than three times a week   Attends Religious Services: More than 4  times per year   Active Member of Clubs or Organizations: No   Attends Archivist Meetings: Never   Marital Status: Married    Tobacco Counseling Ready to quit: Not Answered Counseling given: Not Answered   Clinical Intake:  Pre-visit preparation completed: Yes  Pain : 0-10 Pain Score: 5  Pain Type: Chronic pain Pain Location: Generalized Pain Descriptors / Indicators: Aching, Sore, Discomfort Pain Onset: More than a month ago Pain Frequency: Intermittent     BMI - recorded: 25.32 Nutritional Status: BMI 25 -29 Overweight Nutritional Risks: None Diabetes: Yes CBG done?: No Did pt. bring in CBG monitor from home?: No  How often do you need to have someone help you when you read instructions, pamphlets, or other written materials from your doctor or pharmacy?: 1 - Never Nutrition Risk Assessment:  Has the patient had any N/V/D within the last 2 months?  No  Does the patient have any non-healing wounds?  No  Has the patient had any unintentional weight loss or weight gain?  No   Diabetes:  Is the patient diabetic?  Yes  If diabetic, was a CBG obtained today?  No  Did the patient bring in their glucometer from home?  No  How often do you monitor your CBG's? never.   Financial Strains and Diabetes Management:  Are you having any financial strains with the device, your supplies or your medication? No .  Does the patient want to be seen by Chronic Care Management for management of their  diabetes?  No  Would the patient like to be referred to a Nutritionist or for Diabetic Management?  No   Diabetic Exams:  Diabetic Eye Exam: Completed 05/12/2020.  Diabetic Foot Exam: Completed 11/30/2020. Pt has been advised about the importance in completing this exam. Pt is scheduled for diabetic foot exam on next year    Interpreter Needed?: No  Information entered by :: Zaion Hreha, LPN   Activities of Daily Living In your present state of health, do you have any difficulty performing the following activities: 04/29/2021  Hearing? N  Vision? N  Difficulty concentrating or making decisions? N  Walking or climbing stairs? N  Dressing or bathing? N  Doing errands, shopping? N  Preparing Food and eating ? N  Using the Toilet? N  In the past six months, have you accidently leaked urine? N  Do you have problems with loss of bowel control? N  Managing your Medications? N  Managing your Finances? N  Housekeeping or managing your Housekeeping? N  Some recent data might be hidden    Patient Care Team: [name], DO as PCP - General (Family Medicine) Iran Planas, MD as Consulting Physician (Orthopedic Surgery) Adline Potter, NP as Nurse Practitioner (Anesthesiology) Ob/Gyn, Tammy Sours, Allison Quarry, MD as Referring Physician (Optometry) [facility]  Indicate any recent Medical Services you may have received from other than [facility] providers in the past year (date may be approximate).     Assessment:   This is a routine wellness examination for [facility].  Hearing/Vision screen Hearing Screening - Comments:: Denies hearing difficulties  Vision Screening - Comments:: Wears eyeglasses - up to date with annual eye exam with Dr Marin Comment in [place]  Dietary issues and exercise activities discussed: Current Exercise Habits: Home exercise routine, Type of exercise: walking;stretching, Time (Minutes): 10, Frequency (Times/Week): 7, Weekly Exercise  (Minutes/Week): 70, Intensity: Mild, Exercise limited by: orthopedic condition(s)   Goals Addressed   None  Depression Screen PHQ 2/9 Scores 04/29/2021 04/05/2021 03/29/2021 01/26/2021 11/30/2020 06/01/2020 02/03/2020  PHQ - 2 Score 0 0 0 0 0 0 0  PHQ- 9 Score - - - 0 0 - 0    Fall Risk Fall Risk  04/29/2021 03/29/2021 01/26/2021 06/01/2020 03/19/2019  Falls in the past year? 0 1 1 0 1  Number falls in past yr: 0 0 0 - 0  Injury with Fall? 0 0 1 - 1  Comment - - - - -  Risk for fall due to : Orthopedic patient;Impaired vision;Medication side effect - Impaired balance/gait - -  Follow up Falls prevention discussed - - - Falls prevention discussed;Education provided    FALL RISK PREVENTION PERTAINING TO THE HOME:  Any stairs in or around the home? Yes  If so, are there any without handrails? No  Home free of loose throw rugs in walkways, pet beds, electrical cords, etc? Yes  Adequate lighting in your home to reduce risk of falls? Yes   ASSISTIVE DEVICES UTILIZED TO PREVENT FALLS:  Life alert? No  Use of a cane, walker or w/c? No  Grab bars in the bathroom? No  Shower chair or bench in shower? No  Elevated toilet seat or a handicapped toilet? No   TIMED UP AND GO:  Was the test performed? No . Telephonic visit  Cognitive Function: Normal cognitive status assessed by direct observation by this Nurse Health Advisor. No abnormalities found.       6CIT Screen 04/28/2020 03/19/2019  What Year? 0 points 0 points  What month? 0 points 0 points  What time? 0 points 0 points  Count back from 20 0 points 0 points  Months in reverse 0 points 0 points  Repeat phrase 0 points 0 points  Total Score 0 0    Immunizations Immunization History  Administered Date(s) Administered   Influenza-Unspecified 10/02/2018   Pneumococcal Conjugate-13 05/29/2018   Tdap 05/29/2018    TDAP status: Up to date  Flu Vaccine status: Declined, Education has been provided regarding the importance of this  vaccine but patient still declined. Advised may receive this vaccine at local pharmacy or Health Dept. Aware to provide a copy of the vaccination record if obtained from local pharmacy or Health Dept. Verbalized acceptance and understanding.  Pneumococcal vaccine status: Due, Education has been provided regarding the importance of this vaccine. Advised may receive this vaccine at local pharmacy or Health Dept. Aware to provide a copy of the vaccination record if obtained from local pharmacy or Health Dept. Verbalized acceptance and understanding.  Covid-19 vaccine status: Declined, Education has been provided regarding the importance of this vaccine but patient still declined. Advised may receive this vaccine at local pharmacy or Health Dept.or vaccine clinic. Aware to provide a copy of the vaccination record if obtained from local pharmacy or Health Dept. Verbalized acceptance and understanding.  Qualifies for Shingles Vaccine? Yes   Zostavax completed No   Shingrix Completed?: No.    Education has been provided regarding the importance of this vaccine. Patient has been advised to call insurance company to determine out of pocket expense if they have not yet received this vaccine. Advised may also receive vaccine at local pharmacy or Health Dept. Verbalized acceptance and understanding.  Screening Tests Health Maintenance  Topic Date Due   PAP SMEAR-Modifier  07/06/2016   COVID-19 Vaccine (1) 06/02/2021 (Originally 05/16/1961)   Zoster Vaccines- Shingrix (1 of 2) 06/29/2021 (Originally 05/16/1976)   PNEUMOCOCCAL POLYSACCHARIDE VACCINE AGE 58-64 HIGH  RISK  01/26/2022 (Originally 05/17/1958)   Pneumococcal Vaccine 63-76 Years old (2 - PPSV23 or PCV20) 03/29/2022 (Originally 05/30/2019)   MAMMOGRAM  03/29/2022 (Originally 05/28/2020)   INFLUENZA VACCINE  05/02/2021   OPHTHALMOLOGY EXAM  05/12/2021   HEMOGLOBIN A1C  09/28/2021   FOOT EXAM  11/30/2021   URINE MICROALBUMIN  03/29/2022   COLONOSCOPY (Pts  45-108yr Insurance coverage will need to be confirmed)  11/06/2022   DEXA SCAN  01/28/2023   TETANUS/TDAP  05/29/2028   Hepatitis C Screening  Completed   HIV Screening  Completed   HPV VACCINES  Aged Out    Health Maintenance  Health Maintenance Due  Topic Date Due   PAP SMEAR-Modifier  07/06/2016    Colorectal cancer screening: Type of screening: Colonoscopy. Completed 11/06/2012. Repeat every 10 years  Mammogram status: Ordered 04/2021. Pt provided with contact info and advised to call to schedule appt.   Bone Density status: Completed 01/27/2021. Results reflect: Bone density results: OSTEOPOROSIS. Repeat every 2 years.  Lung Cancer Screening: (Low Dose CT Chest recommended if Age 64-80years, 30 pack-year currently smoking OR have quit w/in 15years.) does not qualify.   Additional Screening:  Hepatitis C Screening: does qualify; Completed 02/02/2021  Vision Screening: Recommended annual ophthalmology exams for early detection of glaucoma and other disorders of the eye. Is the patient up to date with their annual eye exam?  Yes  Who is the provider or what is the name of the office in which the patient attends annual eye exams? [facility] If pt is not established with a provider, would they like to be referred to a provider to establish care? No .   Dental Screening: Recommended annual dental exams for proper oral hygiene  Community Resource Referral / Chronic Care Management: CRR required this visit?  No   CCM required this visit?  No      Plan:     I have personally reviewed and noted the following in the patient's chart:   Medical and social history Use of alcohol, tobacco or illicit drugs  Current medications and supplements including opioid prescriptions.  Functional ability and status Nutritional status Physical activity Advanced directives List of other physicians Hospitalizations, surgeries, and ER visits in previous 12 months Vitals Screenings to  include cognitive, depression, and falls Referrals and appointments  In addition, I have reviewed and discussed with patient certain preventive protocols, quality metrics, and best practice recommendations. A written personalized care plan for preventive services as well as general preventive health recommendations were provided to patient.     ASandrea Hammond LPN   [id number]  Nurse Notes: None

## 2021-04-29 NOTE — Patient Instructions (Signed)
Ms. Schimpf , Thank you for taking time to come for your Medicare Wellness Visit. I appreciate your ongoing commitment to your health goals. Please review the following plan we discussed and let me know if I can assist you in the future.   Screening recommendations/referrals: Colonoscopy: Done 11/06/2012 - Repeat in 10 years  Mammogram: Done 05/28/2018 - Repeat annually - ordered today Bone Density: Done 01/27/2021 - Repeat every 2 years  Recommended yearly ophthalmology/optometry visit for glaucoma screening and checkup Recommended yearly dental visit for hygiene and checkup  Vaccinations: Influenza vaccine: Declined Pneumococcal vaccine: Done 05/29/2018, due for pneumovax-23 Tdap vaccine: Done 05/29/2018 - Repeat in 10 years Shingles vaccine: Due. Shingrix discussed. Please contact your pharmacy for coverage information.    Covid-19: Declined  Advanced directives: Advance directive discussed with you today. Even though you declined this today, please call our office should you change your mind, and we can give you the proper paperwork for you to fill out.   Conditions/risks identified: Aim for 30 minutes of exercise or walking each day, even if you split it into 10 minutes at a time, drink 6-8 glasses of water and eat lots of fruits and vegetables.   Next appointment: Follow up in one year for your annual wellness visit.   Preventive Care 40-64 Years, Female Preventive care refers to lifestyle choices and visits with your health care provider that can promote health and wellness. What does preventive care include? A yearly physical exam. This is also called an annual well check. Dental exams once or twice a year. Routine eye exams. Ask your health care provider how often you should have your eyes checked. Personal lifestyle choices, including: Daily care of your teeth and gums. Regular physical activity. Eating a healthy diet. Avoiding tobacco and drug use. Limiting alcohol  use. Practicing safe sex. Taking low-dose aspirin daily starting at age 57. Taking vitamin and mineral supplements as recommended by your health care provider. What happens during an annual well check? The services and screenings done by your health care provider during your annual well check will depend on your age, overall health, lifestyle risk factors, and family history of disease. Counseling  Your health care provider may ask you questions about your: Alcohol use. Tobacco use. Drug use. Emotional well-being. Home and relationship well-being. Sexual activity. Eating habits. Work and work Statistician. Method of birth control. Menstrual cycle. Pregnancy history. Screening  You may have the following tests or measurements: Height, weight, and BMI. Blood pressure. Lipid and cholesterol levels. These may be checked every 5 years, or more frequently if you are over 47 years old. Skin check. Lung cancer screening. You may have this screening every year starting at age 82 if you have a 30-pack-year history of smoking and currently smoke or have quit within the past 15 years. Fecal occult blood test (FOBT) of the stool. You may have this test every year starting at age 24. Flexible sigmoidoscopy or colonoscopy. You may have a sigmoidoscopy every 5 years or a colonoscopy every 10 years starting at age 30. Hepatitis C blood test. Hepatitis B blood test. Sexually transmitted disease (STD) testing. Diabetes screening. This is done by checking your blood sugar (glucose) after you have not eaten for a while (fasting). You may have this done every 1-3 years. Mammogram. This may be done every 1-2 years. Talk to your health care provider about when you should start having regular mammograms. This may depend on whether you have a family history of breast cancer.  BRCA-related cancer screening. This may be done if you have a family history of breast, ovarian, tubal, or peritoneal cancers. Pelvic  exam and Pap test. This may be done every 3 years starting at age 4. Starting at age 75, this may be done every 5 years if you have a Pap test in combination with an HPV test. Bone density scan. This is done to screen for osteoporosis. You may have this scan if you are at high risk for osteoporosis. Discuss your test results, treatment options, and if necessary, the need for more tests with your health care provider. Vaccines  Your health care provider may recommend certain vaccines, such as: Influenza vaccine. This is recommended every year. Tetanus, diphtheria, and acellular pertussis (Tdap, Td) vaccine. You may need a Td booster every 10 years. Zoster vaccine. You may need this after age 73. Pneumococcal 13-valent conjugate (PCV13) vaccine. You may need this if you have certain conditions and were not previously vaccinated. Pneumococcal polysaccharide (PPSV23) vaccine. You may need one or two doses if you smoke cigarettes or if you have certain conditions. Talk to your health care provider about which screenings and vaccines you need and how often you need them. This information is not intended to replace advice given to you by your health care provider. Make sure you discuss any questions you have with your health care provider. Document Released: 10/15/2015 Document Revised: 06/07/2016 Document Reviewed: 07/20/2015 Elsevier Interactive Patient Education  2017 [place] Prevention in the Home Falls can cause injuries. They can happen to people of all ages. There are many things you can do to make your home safe and to help prevent falls. What can I do on the outside of my home? Regularly fix the edges of walkways and driveways and fix any cracks. Remove anything that might make you trip as you walk through a door, such as a raised step or threshold. Trim any bushes or trees on the path to your home. Use bright outdoor lighting. Clear any walking paths of anything that might  make someone trip, such as rocks or tools. Regularly check to see if handrails are loose or broken. Make sure that both sides of any steps have handrails. Any raised decks and porches should have guardrails on the edges. Have any leaves, snow, or ice cleared regularly. Use sand or salt on walking paths during winter. Clean up any spills in your garage right away. This includes oil or grease spills. What can I do in the bathroom? Use night lights. Install grab bars by the toilet and in the tub and shower. Do not use towel bars as grab bars. Use non-skid mats or decals in the tub or shower. If you need to sit down in the shower, use a plastic, non-slip stool. Keep the floor dry. Clean up any water that spills on the floor as soon as it happens. Remove soap buildup in the tub or shower regularly. Attach bath mats securely with double-sided non-slip rug tape. Do not have throw rugs and other things on the floor that can make you trip. What can I do in the bedroom? Use night lights. Make sure that you have a light by your bed that is easy to reach. Do not use any sheets or blankets that are too big for your bed. They should not hang down onto the floor. Have a firm chair that has side arms. You can use this for support while you get dressed. Do not have  throw rugs and other things on the floor that can make you trip. What can I do in the kitchen? Clean up any spills right away. Avoid walking on wet floors. Keep items that you use a lot in easy-to-reach places. If you need to reach something above you, use a strong step stool that has a grab bar. Keep electrical cords out of the way. Do not use floor polish or wax that makes floors slippery. If you must use wax, use non-skid floor wax. Do not have throw rugs and other things on the floor that can make you trip. What can I do with my stairs? Do not leave any items on the stairs. Make sure that there are handrails on both sides of the stairs  and use them. Fix handrails that are broken or loose. Make sure that handrails are as long as the stairways. Check any carpeting to make sure that it is firmly attached to the stairs. Fix any carpet that is loose or worn. Avoid having throw rugs at the top or bottom of the stairs. If you do have throw rugs, attach them to the floor with carpet tape. Make sure that you have a light switch at the top of the stairs and the bottom of the stairs. If you do not have them, ask someone to add them for you. What else can I do to help prevent falls? Wear shoes that: Do not have high heels. Have rubber bottoms. Are comfortable and fit you well. Are closed at the toe. Do not wear sandals. If you use a stepladder: Make sure that it is fully opened. Do not climb a closed stepladder. Make sure that both sides of the stepladder are locked into place. Ask someone to hold it for you, if possible. Clearly mark and make sure that you can see: Any grab bars or handrails. First and last steps. Where the edge of each step is. Use tools that help you move around (mobility aids) if they are needed. These include: Canes. Walkers. Scooters. Crutches. Turn on the lights when you go into a dark area. Replace any light bulbs as soon as they burn out. Set up your furniture so you have a clear path. Avoid moving your furniture around. If any of your floors are uneven, fix them. If there are any pets around you, be aware of where they are. Review your medicines with your doctor. Some medicines can make you feel dizzy. This can increase your chance of falling. Ask your doctor what other things that you can do to help prevent falls. This information is not intended to replace advice given to you by your health care provider. Make sure you discuss any questions you have with your health care provider. Document Released: 07/15/2009 Document Revised: 02/24/2016 Document Reviewed: 10/23/2014 Elsevier Interactive Patient  Education  2017 [employer].

## 2021-05-03 ENCOUNTER — Encounter: Payer: Self-pay | Admitting: Family Medicine

## 2021-05-03 ENCOUNTER — Ambulatory Visit (INDEPENDENT_AMBULATORY_CARE_PROVIDER_SITE_OTHER): Payer: Medicare HMO | Admitting: Family Medicine

## 2021-05-03 ENCOUNTER — Other Ambulatory Visit: Payer: Self-pay

## 2021-05-03 DIAGNOSIS — J069 Acute upper respiratory infection, unspecified: Secondary | ICD-10-CM

## 2021-05-03 MED ORDER — AMOXICILLIN-POT CLAVULANATE 875-125 MG PO TABS: 1.0000 | ORAL_TABLET | Freq: Two times a day (BID) | ORAL | 0 refills | Status: AC

## 2021-05-03 NOTE — Progress Notes (Signed)
   Virtual Visit  Note Due to COVID-19 pandemic this visit was conducted virtually. This visit type was conducted due to national recommendations for restrictions regarding the COVID-19 Pandemic (e.g. social distancing, sheltering in place) in an effort to limit this patient's exposure and mitigate transmission in our community. All issues noted in this document were discussed and addressed.  A physical exam was not performed with this format.  I connected with Lori Schroeder on 05/03/21 at 1337 by telephone and verified that I am speaking with the correct person using two identifiers. Lori Schroeder is currently located at home and no one is currently with her during the visit. The provider, Gwenlyn Perking, FNP is located in their office at time of visit.  I discussed the limitations, risks, security and privacy concerns of performing an evaluation and management service by telephone and the availability of in person appointments. I also discussed with the patient that there may be a patient responsible charge related to this service. The patient expressed understanding and agreed to proceed.  CC: cough  History and Present Illness:  HPI Lori Schroeder reports cough, congestion and increased shortness of breath with wheezing since yesterday. Reports a temperature of 99.6. She denies headache, sore throat, body aches, or chills. Denies chest pain. She denies exposure to Covid. She has not been vaccinated. She has been using her breztri inhaler and albuterol inhaler BID with good relief. She denies a dx of COPD but reports that she is being treat as so. There is a prior COPD exacerbation dx on her chart. She is a current smoker. She recently completed a round of prednisone.     ROS As per HPI.   Observations/Objective: Alert and oriented x 3. Able to speak in full sentences without difficulty.   Assessment and Plan: [name] was seen today for cough.  Diagnoses and all orders for this visit:  URI with  cough and congestion ? COPD exacerbation. Continue breztri BID. Albuterol every 4-6 hours prn. Augmentin ordered. Patient will come for Covid test, will notify of results. Would qualify for antiviral if positive. Quarantine until results. Mucinex for cough, congestion. Tylenol for fever or pain. Return to office for new or worsening symptoms, or if symptoms persist.  -     amoxicillin-clavulanate (AUGMENTIN) 875-125 MG tablet; Take 1 tablet by mouth 2 (two) times daily for 7 days. -     Novel Coronavirus, NAA ([facility]); Future    Follow Up Instructions: As needed.     I discussed the assessment and treatment plan with the patient. The patient was provided an opportunity to ask questions and all were answered. The patient agreed with the plan and demonstrated an understanding of the instructions.   The patient was advised to call back or seek an in-person evaluation if the symptoms worsen or if the condition fails to improve as anticipated.  The above assessment and management plan was discussed with the patient. The patient verbalized understanding of and has agreed to the management plan. Patient is aware to call the clinic if symptoms persist or worsen. Patient is aware when to return to the clinic for a follow-up visit. Patient educated on when it is appropriate to go to the emergency department.   Time call ended:  1350  I provided 13 minutes of  non face-to-face time during this encounter.    Gwenlyn Perking, FNP

## 2021-05-04 LAB — NOVEL CORONAVIRUS, NAA: SARS-CoV-2, NAA: NOT DETECTED

## 2021-05-04 LAB — SARS-COV-2, NAA 2 DAY TAT

## 2021-05-12 DIAGNOSIS — Z5181 Encounter for therapeutic drug level monitoring: Secondary | ICD-10-CM | POA: Diagnosis not present

## 2021-05-12 DIAGNOSIS — M545 Low back pain, unspecified: Secondary | ICD-10-CM | POA: Diagnosis not present

## 2021-05-12 DIAGNOSIS — M961 Postlaminectomy syndrome, not elsewhere classified: Secondary | ICD-10-CM | POA: Diagnosis not present

## 2021-05-12 DIAGNOSIS — Z79899 Other long term (current) drug therapy: Secondary | ICD-10-CM | POA: Diagnosis not present

## 2021-05-20 ENCOUNTER — Ambulatory Visit: Payer: Medicare HMO | Admitting: Rheumatology

## 2021-06-02 ENCOUNTER — Other Ambulatory Visit: Payer: Self-pay | Admitting: Family Medicine

## 2021-06-02 DIAGNOSIS — Z1231 Encounter for screening mammogram for malignant neoplasm of breast: Secondary | ICD-10-CM

## 2021-06-13 ENCOUNTER — Ambulatory Visit
Admission: RE | Admit: 2021-06-13 | Discharge: 2021-06-13 | Disposition: A | Discharge status: Home / Self Care | Payer: Medicare HMO | Source: Ambulatory Visit | Attending: Family Medicine | Admitting: Family Medicine

## 2021-06-13 ENCOUNTER — Other Ambulatory Visit: Payer: Self-pay

## 2021-06-13 DIAGNOSIS — Z1231 Encounter for screening mammogram for malignant neoplasm of breast: Secondary | ICD-10-CM

## 2021-06-13 DIAGNOSIS — Z5181 Encounter for therapeutic drug level monitoring: Secondary | ICD-10-CM | POA: Diagnosis not present

## 2021-06-13 DIAGNOSIS — M545 Low back pain, unspecified: Secondary | ICD-10-CM | POA: Diagnosis not present

## 2021-06-13 DIAGNOSIS — M961 Postlaminectomy syndrome, not elsewhere classified: Secondary | ICD-10-CM | POA: Diagnosis not present

## 2021-06-13 DIAGNOSIS — Z79899 Other long term (current) drug therapy: Secondary | ICD-10-CM | POA: Diagnosis not present

## 2021-06-27 ENCOUNTER — Telehealth: Payer: Medicare HMO | Admitting: Physician Assistant

## 2021-06-27 DIAGNOSIS — Z20822 Contact with and (suspected) exposure to covid-19: Secondary | ICD-10-CM | POA: Diagnosis not present

## 2021-06-27 MED ORDER — BENZONATATE 100 MG PO CAPS: 100.0000 mg | ORAL_CAPSULE | Freq: Three times a day (TID) | ORAL | 0 refills | Status: DC | PRN

## 2021-06-27 MED ORDER — ALBUTEROL SULFATE HFA 108 (90 BASE) MCG/ACT IN AERS: 2.0000 | INHALATION_SPRAY | Freq: Four times a day (QID) | RESPIRATORY_TRACT | 0 refills | Status: DC | PRN

## 2021-06-27 NOTE — Progress Notes (Signed)
I have spent 5 minutes in review of e-visit questionnaire, review and updating patient chart, medical decision making and response to patient.   Tanganika Barradas Cody Manan Olmo, PA-C    

## 2021-06-27 NOTE — Progress Notes (Signed)
E-Visit for Corona Virus Screening  Your current symptoms could be consistent with the coronavirus.  Many health care providers can now test patients at their office but not all are.  [place] has multiple testing sites. For information on our COVID testing locations and hours go to [email or web address]  We are enrolling you in our MyChart Home Monitoring for COVID19 . Daily you will receive a questionnaire within the MyChart website. Our COVID 19 response team will be monitoring your responses daily.  Testing Information: The COVID-19 Community Testing sites are testing BY APPOINTMENT ONLY.  You can schedule online at [email or web address]  If you do not have access to a smart phone or computer you may call [phone] for an appointment.   Additional testing sites in the Community:  For [facility] Testing sites in [place]  https://www.[facility]vs.com/minuteclinic/covid-19-testing  For Pop-up testing sites in   https://covid19.ncdhhs.gov/about-covid-19/testing/find-my-testing-place/pop-testing-sites  For [facility] https://[place].gov[place]/human-services/health-department/coronavirus-covid-19-info/covid-19-testing  For [place] testing in  and [place] https://[place].gov[place]/human-services/health-department/coronavirus-covid-19-info/covid-19-testing  For [facility] testing in [place]   https://[facility].care/covidtesting  For  more information about community testing call [phone]   Please quarantine yourself while awaiting your test results. Please stay home for a minimum of 10 days from the first day of illness with improving symptoms and you have had 24 hours of no fever (without the use of Tylenol (Acetaminophen) Motrin (Ibuprofen) or any fever reducing medication).  Also - Do not get tested prior to returning to work because once you have had a positive test the test can stay positive for  more than a month in some cases.   You should wear a mask or cloth face covering over your nose and mouth if you must be around other people or animals, including pets (even at home). Try to stay at least 6 feet away from other people. This will protect the people around you.  Please continue good preventive care measures, including:  frequent hand-washing, avoid touching your face, cover coughs/sneezes, stay out of crowds and keep a 6 foot distance from others.  COVID-19 is a respiratory illness with symptoms that are similar to the flu. Symptoms are typically mild to moderate, but there have been cases of severe illness and death due to the virus.   The following symptoms may appear 2-14 days after exposure: Fever Cough Shortness of breath or difficulty breathing Chills Repeated shaking with chills Muscle pain Headache Sore throat New loss of taste or smell Fatigue Congestion or runny nose Nausea or vomiting Diarrhea  Go to the nearest hospital ED for assessment if fever/cough/breathlessness are severe or illness seems like a threat to life.  It is vitally important that if you feel that you have an infection such as this virus or any other virus that you stay home and away from places where you may spread it to others.  You should avoid contact with people age 65 and older.   You can use medication such as prescription cough medication called Tessalon Perles 100 mg. You may take 1-2 capsules every 8 hours as needed for cough and  prescription inhaler called Albuterol MDI 90 mcg /actuation 2 puffs every 4 hours as needed for shortness of breath, wheezing, cough  You may also take acetaminophen (Tylenol) as needed for fever.  Reduce your risk of any infection by using the same precautions used for avoiding the common cold or flu:  Wash your hands often with soap and warm water for at least 20 seconds.  If soap and   water are not readily available, use an alcohol-based hand sanitizer with at  least 60% alcohol.  If coughing or sneezing, cover your mouth and nose by coughing or sneezing into the elbow areas of your shirt or coat, into a tissue or into your sleeve (not your hands). Avoid shaking hands with others and consider head nods or verbal greetings only. Avoid touching your eyes, nose, or mouth with unwashed hands.  Avoid close contact with people who are sick. Avoid places or events with large numbers of people in one location, like concerts or sporting events. Carefully consider travel plans you have or are making. If you are planning any travel outside or inside the [place], visit the CDC's Travelers' Health webpage for the latest health notices. If you have some symptoms but not all symptoms, continue to monitor at home and seek medical attention if your symptoms worsen. If you are having a medical emergency, call 911.  HOME CARE Only take medications as instructed by your medical team. Drink plenty of fluids and get plenty of rest. A steam or ultrasonic humidifier can help if you have congestion.   GET HELP RIGHT AWAY IF YOU HAVE EMERGENCY WARNING SIGNS** FOR COVID-19. If you or someone is showing any of these signs seek emergency medical care immediately. Call 911 or proceed to your closest emergency facility if: You develop worsening high fever. Trouble breathing Bluish lips or face Persistent pain or pressure in the chest New confusion Inability to wake or stay awake You cough up blood. Your symptoms become more severe  **This list is not all possible symptoms. Contact your medical provider for any symptoms that are sever or concerning to you.  MAKE SURE YOU  Understand these instructions. Will watch your condition. Will get help right away if you are not doing well or get worse.  Your e-visit answers were reviewed by a board certified advanced clinical practitioner to complete your personal care plan.  Depending on the condition, your plan could have included  both over the counter or prescription medications.  If there is a problem please reply once you have received a response from your provider.  Your safety is important to us.  If you have drug allergies check your prescription carefully.    You can use MyChart to ask questions about today's visit, request a non-urgent call back, or ask for a work or school excuse for 24 hours related to this e-Visit. If it has been greater than 24 hours you will need to follow up with your provider, or enter a new e-Visit to address those concerns. You will get an e-mail in the next two days asking about your experience.  I hope that your e-visit has been valuable and will speed your recovery. Thank you for using e-visits.   

## 2021-06-28 ENCOUNTER — Ambulatory Visit (INDEPENDENT_AMBULATORY_CARE_PROVIDER_SITE_OTHER): Payer: Medicare HMO | Admitting: Family Medicine

## 2021-06-28 DIAGNOSIS — Z20822 Contact with and (suspected) exposure to covid-19: Secondary | ICD-10-CM

## 2021-06-28 LAB — VERITOR FLU A/B WAIVED
Influenza A: NEGATIVE
Influenza B: NEGATIVE

## 2021-06-28 MED ORDER — PROMETHAZINE-DM 6.25-15 MG/5ML PO SYRP: 2.5000 mL | ORAL_SOLUTION | Freq: Four times a day (QID) | ORAL | 0 refills | Status: DC | PRN

## 2021-06-28 MED ORDER — DEXAMETHASONE 4 MG PO TABS: 4.0000 mg | ORAL_TABLET | Freq: Every day | ORAL | 0 refills | Status: AC

## 2021-06-28 NOTE — Progress Notes (Signed)
Telephone visit  Subjective: CC:?COVID PCP: [name], DO FOY:DXAJO Lori Schroeder is a 65 y.o. female calls for telephone consult today. Patient provides verbal consent for consult held via phone.  Due to COVID-19 pandemic this visit was conducted virtually. This visit type was conducted due to national recommendations for restrictions regarding the COVID-19 Pandemic (e.g. social distancing, sheltering in place) in an effort to limit this patient's exposure and mitigate transmission in our community. All issues noted in this document were discussed and addressed.  A physical exam was not performed with this format.   Location of patient: home Location of provider: [facility] Others present for call: none  1. URI Onset of symptoms Saturday.  She reports some chest tightness.  Has been using her albuterol and breztri.  She had 100F temp.  No nausea, vomiting, or diarrhea.  No myalgia.  Has rhinorrhea.  Cough is chronic and stable.  Headache has resolved. Spouse with many sick contacts at work.   ROS: Per HPI  Allergies  Allergen Reactions   Hydroxychloroquine Itching and Rash    Diffuse drug rash   Sulfa Antibiotics    Past Medical History:  Diagnosis Date   Blood transfusion without reported diagnosis    External hemorrhoids without mention of complication [id number]   Colonoscopy   Family history of malignant neoplasm of gastrointestinal tract    Family history of malignant neoplasm of gastrointestinal tract    GERD (gastroesophageal reflux disease)    Hyperlipemia    Hypertension     Current Outpatient Medications:    albuterol (VENTOLIN HFA) 108 (90 Base) MCG/ACT inhaler, Inhale 2 puffs into the lungs every 6 (six) hours as needed for wheezing or shortness of breath., Disp: 8 g, Rfl: 0   atorvastatin (LIPITOR) 10 MG tablet, TAKE 1 TABLET EVERY DAY, Disp: 90 tablet, Rfl: 1   benzonatate (TESSALON) 100 MG capsule, Take 1 capsule (100 mg total) by mouth 3 (three) times daily as  needed for cough., Disp: 30 capsule, Rfl: 0   Budeson-Glycopyrrol-Formoterol (BREZTRI AEROSPHERE) 160-9-4.8 MCG/ACT AERO, Inhale 2 puffs into the lungs in the morning and at bedtime., Disp: 10.7 g, Rfl: 0   esomeprazole (NEXIUM) 40 MG capsule, TAKE (1) CAPSULE TWICE DAILY., Disp: 60 capsule, Rfl: 5   fluticasone (FLONASE) 50 MCG/ACT nasal spray, Place 2 sprays into both nostrils daily., Disp: 16 g, Rfl: 6   hydrOXYzine (ATARAX/VISTARIL) 25 MG tablet, Take 0.5-1 tablets (12.5-25 mg total) by mouth 3 (three) times daily as needed for itching., Disp: 30 tablet, Rfl: 0   hydrOXYzine (VISTARIL) 25 MG capsule, Take 1 capsule (25 mg total) by mouth every 8 (eight) hours as needed., Disp: 30 capsule, Rfl: 0   naloxone (NARCAN) nasal spray 4 mg/0.1 mL, Place 1 spray into the nose once., Disp: , Rfl:    pregabalin (LYRICA) 300 MG capsule, Take 300 mg by mouth at bedtime., Disp: , Rfl:    pregabalin (LYRICA) 50 MG capsule, Take 100 mg by mouth daily. At suppertime, Disp: , Rfl:    tiZANidine (ZANAFLEX) 4 MG tablet, TAKE ONE TABLET BY MOUTH THREE TIMES DAILY AS NEEDED FOR MUSCLE SPASM, Disp: , Rfl:    TRULICITY 1.5 MC/9.4BS SOPN, Inject 1.5 mg into the skin once a week., Disp: 2 mL, Rfl: 4  Assessment/ Plan: 65 y.o. female   Contact with and (suspected) exposure to covid-19 - Plan: Novel Coronavirus, NAA ([facility]), Veritor Flu A/B Waived  Given possible exposure to COVID-19 from her husband's work I would like her  to be tested for this.  She certainly is in the high risk category and would warrant treatment with antivirals if this were to be positive.  We will also check for flu since this is been positive in our community as of late.  If both of these tests are negative, will plan for empiric treatment with oral antibiotics given suspected COPD.  She has been sent in some steroids to try and help with a the bronchitis aspect of her illness.  Have also sent in a different cough suppressant to use intermittently  if needed.  Home care instructions reviewed and red flags discussed.  Follow-up as needed  Start time: 9:54am End time: 10:04am  Total time spent on patient care (including telephone call/ virtual visit): 10 minutes  [place], [place] [phone]

## 2021-06-29 ENCOUNTER — Other Ambulatory Visit: Payer: Self-pay | Admitting: Family Medicine

## 2021-06-29 LAB — SARS-COV-2, NAA 2 DAY TAT

## 2021-06-29 LAB — NOVEL CORONAVIRUS, NAA: SARS-CoV-2, NAA: DETECTED — AB

## 2021-06-29 MED ORDER — MOLNUPIRAVIR EUA 200MG CAPSULE: 4.0000 | ORAL_CAPSULE | Freq: Two times a day (BID) | ORAL | 0 refills | Status: AC

## 2021-07-06 ENCOUNTER — Encounter: Payer: Self-pay | Admitting: Family Medicine

## 2021-07-06 ENCOUNTER — Ambulatory Visit (INDEPENDENT_AMBULATORY_CARE_PROVIDER_SITE_OTHER): Payer: Medicare HMO | Admitting: Family Medicine

## 2021-07-06 ENCOUNTER — Ambulatory Visit (INDEPENDENT_AMBULATORY_CARE_PROVIDER_SITE_OTHER): Payer: Medicare HMO

## 2021-07-06 VITALS — BP 123/69 | HR 66 | Temp 98.2°F

## 2021-07-06 DIAGNOSIS — J029 Acute pharyngitis, unspecified: Secondary | ICD-10-CM

## 2021-07-06 DIAGNOSIS — J410 Simple chronic bronchitis: Secondary | ICD-10-CM | POA: Diagnosis not present

## 2021-07-06 DIAGNOSIS — Z20818 Contact with and (suspected) exposure to other bacterial communicable diseases: Secondary | ICD-10-CM | POA: Diagnosis not present

## 2021-07-06 DIAGNOSIS — R059 Cough, unspecified: Secondary | ICD-10-CM | POA: Diagnosis not present

## 2021-07-06 DIAGNOSIS — U071 COVID-19: Secondary | ICD-10-CM

## 2021-07-06 LAB — RAPID STREP SCREEN (MED CTR MEBANE ONLY): Strep Gp A Ag, IA W/Reflex: NEGATIVE

## 2021-07-06 LAB — CULTURE, GROUP A STREP

## 2021-07-06 MED ORDER — AMOXICILLIN-POT CLAVULANATE 875-125 MG PO TABS: 1.0000 | ORAL_TABLET | Freq: Two times a day (BID) | ORAL | 0 refills | Status: DC

## 2021-07-06 MED ORDER — METHYLPREDNISOLONE ACETATE 40 MG/ML IJ SUSP
40.0000 mg | Freq: Once | INTRAMUSCULAR | Status: AC
  Administered 2021-07-06: 40 mg via INTRAMUSCULAR

## 2021-07-06 NOTE — Patient Instructions (Addendum)
You were given a steroid shot today to help open you up Xray looked clear on my evaluation.  Awaiting result from radiologist. Keep using the albuterol every 6 hours if needed for shortness of breath/ wheezing I sent an Augmentin prescription just in case things get worse over the next few days to cover for COPD flare up.

## 2021-07-06 NOTE — Progress Notes (Signed)
Subjective: YI:FOYDX 19 infection PCP: [name], DO AJO:INOMV Michetti is a 65 y.o. female presenting to clinic today for:  1. COVID19 infection Tightness with COVID-19 infection 7 days ago.  She was started on Molnupiravir.  She presents today because she has had some shortness of breath that is a little bit more than her baseline.  She is been using her albuterol inhaler but does not find it to be especially helpful.  The [name] does help some but causes her to feel very jittery.  She felt that the Promethazine DM was very helpful in cough suppressing and she does not need a refill on that at this time.  No reports of hemoptysis.  Of note she has had a little bit of a sore throat and apparently 2 of her grandchildren had strep recently   ROS: Per HPI  Allergies  Allergen Reactions   Hydroxychloroquine Itching and Rash    Diffuse drug rash   Sulfa Antibiotics    Past Medical History:  Diagnosis Date   Blood transfusion without reported diagnosis    External hemorrhoids without mention of complication [id number]   Colonoscopy   Family history of malignant neoplasm of gastrointestinal tract    Family history of malignant neoplasm of gastrointestinal tract    GERD (gastroesophageal reflux disease)    Hyperlipemia    Hypertension     Current Outpatient Medications:    albuterol (VENTOLIN HFA) 108 (90 Base) MCG/ACT inhaler, Inhale 2 puffs into the lungs every 6 (six) hours as needed for wheezing or shortness of breath., Disp: 8 g, Rfl: 0   atorvastatin (LIPITOR) 10 MG tablet, TAKE 1 TABLET EVERY DAY, Disp: 90 tablet, Rfl: 1   benzonatate (TESSALON) 100 MG capsule, Take 1 capsule (100 mg total) by mouth 3 (three) times daily as needed for cough., Disp: 30 capsule, Rfl: 0   Budeson-Glycopyrrol-Formoterol (BREZTRI AEROSPHERE) 160-9-4.8 MCG/ACT AERO, Inhale 2 puffs into the lungs in the morning and at bedtime., Disp: 10.7 g, Rfl: 0   esomeprazole (NEXIUM) 40 MG capsule, TAKE  (1) CAPSULE TWICE DAILY., Disp: 60 capsule, Rfl: 5   fluticasone (FLONASE) 50 MCG/ACT nasal spray, Place 2 sprays into both nostrils daily., Disp: 16 g, Rfl: 6   hydrOXYzine (ATARAX/VISTARIL) 25 MG tablet, Take 0.5-1 tablets (12.5-25 mg total) by mouth 3 (three) times daily as needed for itching., Disp: 30 tablet, Rfl: 0   hydrOXYzine (VISTARIL) 25 MG capsule, Take 1 capsule (25 mg total) by mouth every 8 (eight) hours as needed., Disp: 30 capsule, Rfl: 0   naloxone (NARCAN) nasal spray 4 mg/0.1 mL, Place 1 spray into the nose once., Disp: , Rfl:    pregabalin (LYRICA) 300 MG capsule, Take 300 mg by mouth at bedtime., Disp: , Rfl:    pregabalin (LYRICA) 50 MG capsule, Take 100 mg by mouth daily. At suppertime, Disp: , Rfl:    promethazine-dextromethorphan (PROMETHAZINE-DM) 6.25-15 MG/5ML syrup, Take 2.5 mLs by mouth 4 (four) times daily as needed for cough., Disp: 118 mL, Rfl: 0   tiZANidine (ZANAFLEX) 4 MG tablet, TAKE ONE TABLET BY MOUTH THREE TIMES DAILY AS NEEDED FOR MUSCLE SPASM, Disp: , Rfl:    TRULICITY 1.5 SJ/6.2EZ SOPN, Inject 1.5 mg into the skin once a week., Disp: 2 mL, Rfl: 4 Social History   Socioeconomic History   Marital status: Married    Spouse name: Christia Reading   Number of children: 2   Years of education: 14   Highest education level: Associate degree: occupational, Hotel manager,  or vocational program  Occupational History   Occupation: disability    Employer: DISABLED  Tobacco Use   Smoking status: Every Day    Packs/day: 0.50    Years: 10.00    Pack years: 5.00    Types: Cigarettes   Smokeless tobacco: Never  Vaping Use   Vaping Use: Never used  Substance and Sexual Activity   Alcohol use: No   Drug use: No   Sexual activity: Not on file  Other Topics Concern   Not on file  Social History Narrative   Not on file   Social Determinants of Health   Financial Resource Strain: Low Risk    Difficulty of Paying Living Expenses: Not hard at all  Food Insecurity: No  Food Insecurity   Worried About Charity fundraiser in the Last Year: Never true   Ran Out of Food in the Last Year: Never true  Transportation Needs: Not on file  Physical Activity: Insufficiently Active   Days of Exercise per Week: 7 days   Minutes of Exercise per Session: 10 min  Stress: No Stress Concern Present   Feeling of Stress : Not at all  Social Connections: Moderately Integrated   Frequency of Communication with Friends and Family: More than three times a week   Frequency of Social Gatherings with Friends and Family: More than three times a week   Attends Religious Services: More than 4 times per year   Active Member of [employer] or Organizations: No   Attends Music therapist: Never   Marital Status: Married  Human resources officer Violence: Not At Risk   Fear of Current or Ex-Partner: No   Emotionally Abused: No   Physically Abused: No   Sexually Abused: No   Family History  Problem Relation Age of Onset   Colon cancer Maternal Grandmother    Arthritis Maternal Grandmother    Alzheimer's disease Mother    Hyperlipidemia Mother    Hypertension Mother    Cancer Father    Alcohol abuse Father    Diabetes Daughter        Type 1   Diabetes Son    Breast cancer Neg Hx    Celiac disease Neg Hx    Cirrhosis Neg Hx    Clotting disorder Neg Hx    Colitis Neg Hx    Colon polyps Neg Hx    Crohn's disease Neg Hx    Cystic fibrosis Neg Hx    Esophageal cancer Neg Hx    Heart disease Neg Hx    Hemochromatosis Neg Hx    Inflammatory bowel disease Neg Hx    Irritable bowel syndrome Neg Hx    Kidney disease Neg Hx    Liver cancer Neg Hx    Liver disease Neg Hx    Ovarian cancer Neg Hx    Pancreatic cancer Neg Hx    Prostate cancer Neg Hx    Rectal cancer Neg Hx    Stomach cancer Neg Hx    Ulcerative colitis Neg Hx    Uterine cancer Neg Hx    Wilson's disease Neg Hx     Objective: Office vital signs reviewed. BP 123/69   Pulse 66   Temp 98.2 F (36.8 C)    SpO2 97%   Physical Examination:  General: Awake, alert, nontoxic, No acute distress HEENT: Normal; oropharynx without exudates but she has mild erythema Cardio: regular rate and rhythm, S1S2 heard, no murmurs appreciated Pulm: clear to auscultation bilaterally, no wheezes,  rhonchi or rales; normal work of breathing on room air  Assessment/ Plan: 65 y.o. female   COVID-19 virus infection - Plan: DG Chest 2 View, methylPREDNISolone acetate (DEPO-MEDROL) injection 40 mg  Sore throat - Plan: Rapid Strep Screen ([facility] ONLY), amoxicillin-clavulanate (AUGMENTIN) 875-125 MG tablet  Exposure to strep throat - Plan: Rapid Strep Screen ([facility] ONLY), amoxicillin-clavulanate (AUGMENTIN) 875-125 MG tablet  Simple chronic bronchitis (HCC) - Plan: methylPREDNISolone acetate (DEPO-MEDROL) injection 40 mg, amoxicillin-clavulanate (AUGMENTIN) 875-125 MG tablet  Chest x-ray without pulmonary infiltrates.  I gave her a dose of Depo-Medrol intramuscularly today.  Her lung exam however was fairly unremarkable and without wheezes.  Given exposure to strep throat I tested her and this was negative.  Empiric treatment with Augmentin for bronchitis has also been provided.  She will follow-up if symptoms are worsening.  No orders of the defined types were placed in this encounter.  No orders of the defined types were placed in this encounter.    [name], DO [place] [phone]

## 2021-07-12 DIAGNOSIS — M545 Low back pain, unspecified: Secondary | ICD-10-CM | POA: Diagnosis not present

## 2021-07-12 DIAGNOSIS — M961 Postlaminectomy syndrome, not elsewhere classified: Secondary | ICD-10-CM | POA: Diagnosis not present

## 2021-07-19 ENCOUNTER — Other Ambulatory Visit: Payer: Self-pay | Admitting: Family Medicine

## 2021-07-19 DIAGNOSIS — E785 Hyperlipidemia, unspecified: Secondary | ICD-10-CM

## 2021-07-19 DIAGNOSIS — E119 Type 2 diabetes mellitus without complications: Secondary | ICD-10-CM

## 2021-07-19 DIAGNOSIS — E1169 Type 2 diabetes mellitus with other specified complication: Secondary | ICD-10-CM

## 2021-08-01 ENCOUNTER — Other Ambulatory Visit: Payer: Self-pay

## 2021-08-01 ENCOUNTER — Encounter: Payer: Self-pay | Admitting: Family Medicine

## 2021-08-01 ENCOUNTER — Ambulatory Visit (INDEPENDENT_AMBULATORY_CARE_PROVIDER_SITE_OTHER): Payer: Medicare HMO | Admitting: Family Medicine

## 2021-08-01 VITALS — BP 132/69 | HR 60 | Temp 97.7°F | Ht <= 58 in | Wt 119.4 lb

## 2021-08-01 DIAGNOSIS — E1169 Type 2 diabetes mellitus with other specified complication: Secondary | ICD-10-CM

## 2021-08-01 DIAGNOSIS — I152 Hypertension secondary to endocrine disorders: Secondary | ICD-10-CM

## 2021-08-01 DIAGNOSIS — E1159 Type 2 diabetes mellitus with other circulatory complications: Secondary | ICD-10-CM

## 2021-08-01 DIAGNOSIS — E785 Hyperlipidemia, unspecified: Secondary | ICD-10-CM

## 2021-08-01 DIAGNOSIS — R413 Other amnesia: Secondary | ICD-10-CM

## 2021-08-01 DIAGNOSIS — L270 Generalized skin eruption due to drugs and medicaments taken internally: Secondary | ICD-10-CM | POA: Diagnosis not present

## 2021-08-01 LAB — BAYER DCA HB A1C WAIVED: HB A1C (BAYER DCA - WAIVED): 6.2 % — ABNORMAL HIGH (ref 4.8–5.6)

## 2021-08-01 MED ORDER — FLUTICASONE PROPIONATE 50 MCG/ACT NA SUSP: 2.0000 | Freq: Every day | NASAL | 6 refills | Status: DC

## 2021-08-01 MED ORDER — NALOXONE HCL 4 MG/0.1ML NA LIQD: 1.0000 | Freq: Once | NASAL | 0 refills | Status: AC

## 2021-08-01 NOTE — Progress Notes (Signed)
Subjective: CC: DM PCP: [name], DO MQK:MMNOT Lori Schroeder is a 65 y.o. female presenting to clinic today for:  1. Type 2 Diabetes with hyperlipidemia:  Compliant with Trulicity, Lipitor.  No reports of chest pain.  She occasionally has shortness of breath but this happens when she smokes more.  Down to 1/2 to 3/4 pack/day.  Compliant with her Breztri.  She does report some difficulty with vision compared to last year.  She has spasm of the right eyelid and reports foreign body/dry sensation.  She sees Dr. Marin Comment  Last eye exam: needs Last foot exam: UTD Last A1c:  Lab Results  Component Value Date   HGBA1C 6.3 03/29/2021   Nephropathy screen indicated?: UTD Last flu, zoster and/or pneumovax:  Immunization History  Administered Date(s) Administered   Influenza-Unspecified 10/02/2018   Pneumococcal Conjugate-13 05/29/2018   Tdap 05/29/2018   2.  Memory concern Patient reports that she has concerns about her memory.  Her mother passed away from complications of Alzheimer's disease.  She finds herself frequently forgetting stuff when she goes from one room to another.  Her husband has commented on it.  She wants to know if she can be tested for Alzheimer's but really does not want to see a neurologist to have this done yet.  ROS: Per HPI  Allergies  Allergen Reactions   Hydroxychloroquine Itching and Rash    Diffuse drug rash   Sulfa Antibiotics    Past Medical History:  Diagnosis Date   Blood transfusion without reported diagnosis    External hemorrhoids without mention of complication [id number]   Colonoscopy   Family history of malignant neoplasm of gastrointestinal tract    Family history of malignant neoplasm of gastrointestinal tract    GERD (gastroesophageal reflux disease)    Hyperlipemia    Hypertension     Current Outpatient Medications:    albuterol (VENTOLIN HFA) 108 (90 Base) MCG/ACT inhaler, Inhale 2 puffs into the lungs every 6 (six) hours as needed for  wheezing or shortness of breath., Disp: 8 g, Rfl: 0   amoxicillin-clavulanate (AUGMENTIN) 875-125 MG tablet, Take 1 tablet by mouth 2 (two) times daily., Disp: 20 tablet, Rfl: 0   atorvastatin (LIPITOR) 10 MG tablet, TAKE 1 TABLET EVERY DAY, Disp: 90 tablet, Rfl: 0   Budeson-Glycopyrrol-Formoterol (BREZTRI AEROSPHERE) 160-9-4.8 MCG/ACT AERO, Inhale 2 puffs into the lungs in the morning and at bedtime., Disp: 10.7 g, Rfl: 0   Dulaglutide (TRULICITY) 1.5 XU/3.8BF SOPN, INJECT 1.$RemoveBefore'5MG'[id number]$  ONCE A WEEK, Disp: 2 mL, Rfl: 0   esomeprazole (NEXIUM) 40 MG capsule, TAKE (1) CAPSULE TWICE DAILY., Disp: 60 capsule, Rfl: 5   fluticasone (FLONASE) 50 MCG/ACT nasal spray, Place 2 sprays into both nostrils daily., Disp: 16 g, Rfl: 6   naloxone (NARCAN) nasal spray 4 mg/0.1 mL, Place 1 spray into the nose once., Disp: , Rfl:    pregabalin (LYRICA) 300 MG capsule, Take 300 mg by mouth at bedtime., Disp: , Rfl:    pregabalin (LYRICA) 50 MG capsule, Take 100 mg by mouth daily. At suppertime, Disp: , Rfl:    promethazine-dextromethorphan (PROMETHAZINE-DM) 6.25-15 MG/5ML syrup, Take 2.5 mLs by mouth 4 (four) times daily as needed for cough., Disp: 118 mL, Rfl: 0   tiZANidine (ZANAFLEX) 4 MG tablet, TAKE ONE TABLET BY MOUTH THREE TIMES DAILY AS NEEDED FOR MUSCLE SPASM, Disp: , Rfl:  Social History   Socioeconomic History   Marital status: Married    Spouse name: Christia Reading   Number of children: 2  Years of education: 95   Highest education level: Associate degree: occupational, Scientist, product/process development, or vocational program  Occupational History   Occupation: disability    Employer: DISABLED  Tobacco Use   Smoking status: Every Day    Packs/day: 0.50    Years: 10.00    Pack years: 5.00    Types: Cigarettes   Smokeless tobacco: Never  Vaping Use   Vaping Use: Never used  Substance and Sexual Activity   Alcohol use: No   Drug use: No   Sexual activity: Not on file  Other Topics Concern   Not on file  Social History Narrative    Not on file   Social Determinants of Health   Financial Resource Strain: Low Risk    Difficulty of Paying Living Expenses: Not hard at all  Food Insecurity: No Food Insecurity   Worried About Programme researcher, broadcasting/film/video in the Last Year: Never true   Barista in the Last Year: Never true  Transportation Needs: Not on file  Physical Activity: Insufficiently Active   Days of Exercise per Week: 7 days   Minutes of Exercise per Session: 10 min  Stress: No Stress Concern Present   Feeling of Stress : Not at all  Social Connections: Moderately Integrated   Frequency of Communication with Friends and Family: More than three times a week   Frequency of Social Gatherings with Friends and Family: More than three times a week   Attends Religious Services: More than 4 times per year   Active Member of [employer] or Organizations: No   Attends Engineer, structural: Never   Marital Status: Married  Catering manager Violence: Not At Risk   Fear of Current or Ex-Partner: No   Emotionally Abused: No   Physically Abused: No   Sexually Abused: No   Family History  Problem Relation Age of Onset   Colon cancer Maternal Grandmother    Arthritis Maternal Grandmother    Alzheimer's disease Mother    Hyperlipidemia Mother    Hypertension Mother    Cancer Father    Alcohol abuse Father    Diabetes Daughter        Type 1   Diabetes Son    Breast cancer Neg Hx    Celiac disease Neg Hx    Cirrhosis Neg Hx    Clotting disorder Neg Hx    Colitis Neg Hx    Colon polyps Neg Hx    Crohn's disease Neg Hx    Cystic fibrosis Neg Hx    Esophageal cancer Neg Hx    Heart disease Neg Hx    Hemochromatosis Neg Hx    Inflammatory bowel disease Neg Hx    Irritable bowel syndrome Neg Hx    Kidney disease Neg Hx    Liver cancer Neg Hx    Liver disease Neg Hx    Ovarian cancer Neg Hx    Pancreatic cancer Neg Hx    Prostate cancer Neg Hx    Rectal cancer Neg Hx    Stomach cancer Neg Hx     Ulcerative colitis Neg Hx    Uterine cancer Neg Hx    Wilson's disease Neg Hx     Objective: Office vital signs reviewed. BP 132/69   Pulse 60   Temp 97.7 F (36.5 C)   Ht 4\' 9"  (1.448 m)   Wt 119 lb 6.4 oz (54.2 kg)   SpO2 97%   BMI 25.84 kg/m   Physical Examination:  General: Awake, alert, well nourished, No acute distress HEENT: Normal; sclera white.  Sclera white.  No foreign bodies appreciated.  Eyelids without masses or significant erythema Cardio: regular rate and rhythm, S1S2 heard, no murmurs appreciated Pulm: clear to auscultation bilaterally, no wheezes, rhonchi or rales; normal work of breathing on room air Neuro: see MMSE  MMSE - Mini Mental State Exam 08/01/2021  Orientation to time 5  Orientation to Place 5  Registration 3  Attention/ Calculation 5  Recall 3  Language- name 2 objects 2  Language- repeat 1  Language- follow 3 step command 3  Language- read & follow direction 1  Write a sentence 1  Copy design 1  Total score 30     Assessment/ Plan: 65 y.o. female   Controlled type 2 diabetes mellitus with other specified complication, without long-term current use of insulin (HCC) - Plan: Bayer DCA Hb A1c Waived  Hyperlipidemia associated with type 2 diabetes mellitus ([place]) - Plan: Lipid panel  Hypertension associated with diabetes ([place])  Memory loss - Plan: Vitamin B12, TSH, CMP14+EGFR, RPR  Drug rash - Plan: fluticasone (FLONASE) 50 MCG/ACT nasal spray  Sugar remains controlled with A1c of 6.2.  No changes.  Continue Trulicity.  Check lipid panel.  Continue statin  Blood pressure is diet controlled.  No changes  No evidence of memory loss but we will proceed with metabolic evaluation.  Offered referral to neurology but she declined this today.  Flonase renewed.  Utilizing this for contact dermatitis from her Butrans patch  Orders Placed This Encounter  Procedures   Bayer DCA Hb A1c Waived   Lipid panel   Vitamin B12   TSH    CMP14+EGFR   RPR   No orders of the defined types were placed in this encounter.    [name], DO [place] [phone]

## 2021-08-01 NOTE — Patient Instructions (Signed)
Normal dementia screening.  We talked about consideration for evaluation with Neurology for more in depth testing  Before you refill your Trulicity next month, give [place] a call to see if we have samples  See Dr Marin Comment about dry eye.  Dry Eye Dry eye, also called keratoconjunctivitis sicca, is dryness of the membranes surrounding the eye. It happens when there are not enough healthy, natural tears in the eyes. The eyes must remain moist at all times. A small amount of tears is constantly produced by the tear glands (lacrimal glands). These glands are located under the outside part of the upper eyelids. Dry eye can happen on its own or be a symptom of several conditions, such as rheumatoid arthritis, lupus, or Sjgren's syndrome. Dry eye may be mild to severe. What are the causes? This condition may be caused by: Not making enough tears (aqueous tear-deficient dry eyes). Tears evaporating from the eyes too quickly (evaporative dry eyes). This is when there is an abnormality in the quality of your tears. This abnormality causes your tears to evaporate so quickly that the eyes cannot be kept moist. What increases the risk? You are more likely to develop this condition if you: Are a woman, especially if you have gone through menopause. Live in a dry climate. Live in a dusty or smoky area. Take certain medicines, such as: Anti-allergy medicines (antihistamines). Blood pressure medicines (antihypertensives). Birth control pills (oral contraceptives). Laxatives. Tranquilizers. Have a history of refractive eye surgery, such as LASIK. Have a history of long-term contact lens use. What are the signs or symptoms? Symptoms of this condition include: Irritation. Itchiness. Redness. Burning. Inflammation of the eyelids. Feeling as though something is stuck in the eye. Light sensitivity. Increased sensitivity and discomfort when wearing contact lenses. Vision that varies throughout the day. Occasional  excessive tearing. How is this diagnosed? This condition is diagnosed based on your symptoms, your medical history, and an eye exam. Your health care provider may look at your eye using a microscope and may put dyes in your eye to check the health of the surface of your eye. You may have tests, such as a test to evaluate your tear production (Schirmer test). During this test, a small strip of special paper is gently pressed into the inner corner of your eye. Your tear production is measured by how much of the paper is moistened by your tears during a set amount of time. You may be referred to a health care provider who specializes in eyes and eyesight (ophthalmologist). How is this treated? Treatment for this condition depends on the severity. Mild cases are often treated at home. To help relieve your symptoms, your health care provider may recommend eye drops, which are also called artificial tears. If your condition is severe, treatment may include: Prescription eye drops. Over-the-counter or prescription ointments to moisten your eyes. Minor surgery to place plugs into the tear ducts. This keep tears from draining so that tears can stay on the surface of the eye longer. Medicines to reduce inflammation of the eyelids. Taking an omega-3 fatty acid nutritional supplement. Follow these instructions at home: Take or apply over-the-counter and prescription medicines only as told by your health care provider. This includes eye drops. If directed, apply a warm compress to your eyes to help reduce inflammation. Place a towel over your eyes and gently press the warm compress over your eyes for about 5 minutes, or as long as told by your health care provider. Drink plenty of fluids  to stay well hydrated. If possible, avoid dry, drafty environments. Wear sunglasses when outdoors to protect your eyes from the sun and wind. Use a humidifier at home to increase moisture in the air. Remember to blink  often when reading or using the computer for long periods. If you wear contact lenses, remove them regularly to give your eyes a break. Always remove contacts before sleeping. Have a yearly eye exam and vision test. Keep all follow-up visits as told by your health care provider. This is important. Contact a health care provider if: You have eye pain. You have pus-like fluid coming from your eye. Your symptoms get worse or do not improve with treatment. Get help right away if: Your vision suddenly changes. Summary Dry eye is dryness of the membranes surrounding the eye. Dry eye can happen on its own or be a symptom of several conditions, such as rheumatoid arthritis, lupus, or Sjgren's syndrome. This condition is diagnosed based on your symptoms, your medical history, and an eye exam. Treatment for this condition depends on the severity. Mild cases are often treated at home. To help relieve your symptoms, your health care provider may recommend eye drops, which are also called artificial tears. This information is not intended to replace advice given to you by your health care provider. Make sure you discuss any questions you have with your health care provider. Document Revised: 03/12/2018 Document Reviewed: 03/12/2018 Elsevier Patient Education  [place].

## 2021-08-02 LAB — CMP14+EGFR
ALT: 15 IU/L (ref 0–32)
AST: 13 IU/L (ref 0–40)
Albumin/Globulin Ratio: 1.7 (ref 1.2–2.2)
Albumin: 4.6 g/dL (ref 3.8–4.8)
Alkaline Phosphatase: 99 IU/L (ref 44–121)
BUN/Creatinine Ratio: 10 — ABNORMAL LOW (ref 12–28)
BUN: 7 mg/dL — ABNORMAL LOW (ref 8–27)
Bilirubin Total: 0.2 mg/dL (ref 0.0–1.2)
CO2: 25 mmol/L (ref 20–29)
Calcium: 9.8 mg/dL (ref 8.7–10.3)
Chloride: 102 mmol/L (ref 96–106)
Creatinine, Ser: 0.7 mg/dL (ref 0.57–1.00)
Globulin, Total: 2.7 g/dL (ref 1.5–4.5)
Glucose: 85 mg/dL (ref 70–99)
Potassium: 4.4 mmol/L (ref 3.5–5.2)
Sodium: 140 mmol/L (ref 134–144)
Total Protein: 7.3 g/dL (ref 6.0–8.5)
eGFR: 97 mL/min/{1.73_m2} (ref >59)

## 2021-08-02 LAB — LIPID PANEL
Chol/HDL Ratio: 2.4 ratio (ref 0.0–4.4)
Cholesterol, Total: 165 mg/dL (ref 100–199)
HDL: 68 mg/dL (ref >39)
LDL Chol Calc (NIH): 74 mg/dL (ref 0–99)
Triglycerides: 132 mg/dL (ref 0–149)
VLDL Cholesterol Cal: 23 mg/dL (ref 5–40)

## 2021-08-02 LAB — VITAMIN B12: Vitamin B-12: 420 pg/mL (ref 232–1245)

## 2021-08-02 LAB — TSH: TSH: 2.54 u[IU]/mL (ref 0.450–4.500)

## 2021-08-09 DIAGNOSIS — M79672 Pain in left foot: Secondary | ICD-10-CM | POA: Diagnosis not present

## 2021-08-09 DIAGNOSIS — D2372 Other benign neoplasm of skin of left lower limb, including hip: Secondary | ICD-10-CM | POA: Diagnosis not present

## 2021-08-29 ENCOUNTER — Telehealth: Payer: Self-pay | Admitting: Family Medicine

## 2021-08-29 NOTE — Telephone Encounter (Signed)
Samples available, pt aware

## 2021-09-03 ENCOUNTER — Other Ambulatory Visit: Payer: Self-pay | Admitting: Family Medicine

## 2021-09-03 DIAGNOSIS — K219 Gastro-esophageal reflux disease without esophagitis: Secondary | ICD-10-CM

## 2021-09-08 ENCOUNTER — Ambulatory Visit (INDEPENDENT_AMBULATORY_CARE_PROVIDER_SITE_OTHER): Payer: Medicare HMO | Admitting: Family Medicine

## 2021-09-08 ENCOUNTER — Encounter: Payer: Self-pay | Admitting: Family Medicine

## 2021-09-08 DIAGNOSIS — J441 Chronic obstructive pulmonary disease with (acute) exacerbation: Secondary | ICD-10-CM | POA: Diagnosis not present

## 2021-09-08 MED ORDER — DOXYCYCLINE HYCLATE 100 MG PO TABS: 100.0000 mg | ORAL_TABLET | Freq: Two times a day (BID) | ORAL | 0 refills | Status: AC

## 2021-09-08 MED ORDER — PREDNISONE 20 MG PO TABS: 40.0000 mg | ORAL_TABLET | Freq: Every day | ORAL | 0 refills | Status: AC

## 2021-09-08 NOTE — Progress Notes (Signed)
   Virtual Visit  Note Due to COVID-19 pandemic this visit was conducted virtually. This visit type was conducted due to national recommendations for restrictions regarding the COVID-19 Pandemic (e.g. social distancing, sheltering in place) in an effort to limit this patient's exposure and mitigate transmission in our community. All issues noted in this document were discussed and addressed.  A physical exam was not performed with this format.  I connected with Dayle Sherpa on 09/08/21 at 1119 by telephone and verified that I am speaking with the correct person using two identifiers. Sanoe Hazan is currently located at home and no one is currently with her during the visit. The provider, Gwenlyn Perking, FNP is located in their office at time of visit.  I discussed the limitations, risks, security and privacy concerns of performing an evaluation and management service by telephone and the availability of in person appointments. I also discussed with the patient that there may be a patient responsible charge related to this service. The patient expressed understanding and agreed to proceed.  CC: cough  History and Present Illness:  HPI Maliea reports increased cough with yellow sputum. She also reports increased shortness of breath, wheezing, and chest congestion. She had a fever last night of 102 with chills. Reports afebrile today. She has been using her Breztri and albuterol. She has a history of COPD.     ROS As per HPI.   Observations/Objective: Alert and oriented x 3. Able to speak in full sentences without difficulty.   Assessment and Plan: Maple was seen today for cough.  Diagnoses and all orders for this visit:  COPD exacerbation ([place]) Doxycyline as below with prednisone burst. Discussed mucinex and hydration. Discussed symptomatic care and return precautions.  -     doxycycline (VIBRA-TABS) 100 MG tablet; Take 1 tablet (100 mg total) by mouth 2 (two) times daily for 7 days. 1 po  bid -     predniSONE (DELTASONE) 20 MG tablet; Take 2 tablets (40 mg total) by mouth daily with breakfast for 5 days.    Follow Up Instructions: As needed.     I discussed the assessment and treatment plan with the patient. The patient was provided an opportunity to ask questions and all were answered. The patient agreed with the plan and demonstrated an understanding of the instructions.   The patient was advised to call back or seek an in-person evaluation if the symptoms worsen or if the condition fails to improve as anticipated.  The above assessment and management plan was discussed with the patient. The patient verbalized understanding of and has agreed to the management plan. Patient is aware to call the clinic if symptoms persist or worsen. Patient is aware when to return to the clinic for a follow-up visit. Patient educated on when it is appropriate to go to the emergency department.   Time call ended:  1130  I provided 11 minutes of  non face-to-face time during this encounter.    Gwenlyn Perking, FNP

## 2021-10-10 ENCOUNTER — Telehealth: Payer: Self-pay | Admitting: Family Medicine

## 2021-10-11 NOTE — Telephone Encounter (Signed)
Please let patient know: We do not have any trulicity samples at this time We do have rybelsus 3mg  available to bridge patient if needed, but instruction would need to be given.  We will get patient scheduled for patient assistance

## 2021-10-12 ENCOUNTER — Other Ambulatory Visit: Payer: Self-pay | Admitting: Family Medicine

## 2021-10-12 ENCOUNTER — Telehealth: Payer: Self-pay

## 2021-10-12 DIAGNOSIS — Z79899 Other long term (current) drug therapy: Secondary | ICD-10-CM | POA: Diagnosis not present

## 2021-10-12 DIAGNOSIS — E119 Type 2 diabetes mellitus without complications: Secondary | ICD-10-CM

## 2021-10-12 DIAGNOSIS — M961 Postlaminectomy syndrome, not elsewhere classified: Secondary | ICD-10-CM | POA: Diagnosis not present

## 2021-10-12 DIAGNOSIS — M533 Sacrococcygeal disorders, not elsewhere classified: Secondary | ICD-10-CM | POA: Diagnosis not present

## 2021-10-12 DIAGNOSIS — Z5181 Encounter for therapeutic drug level monitoring: Secondary | ICD-10-CM | POA: Diagnosis not present

## 2021-10-12 DIAGNOSIS — M545 Low back pain, unspecified: Secondary | ICD-10-CM | POA: Diagnosis not present

## 2021-10-12 NOTE — Chronic Care Management (AMB) (Signed)
Chronic Care Management   Note  10/12/2021 Name: Sharmon Cheramie MRN: [id number] DOB: May 19, 1956  Joyceline Maiorino is a 66 y.o. year old female who is a primary care patient of [name], DO. I reached out to [employer] by phone today in response to a referral sent by Ms. Sanaii Ono's PCP.  Ms. Devito was given information about Chronic Care Management services today including:  CCM service includes personalized support from designated clinical staff supervised by her physician, including individualized plan of care and coordination with other care providers 24/7 contact phone numbers for assistance for urgent and routine care needs. Service will only be billed when office clinical staff spend 20 minutes or more in a month to coordinate care. Only one practitioner may furnish and bill the service in a calendar month. The patient may stop CCM services at any time (effective at the end of the month) by phone call to the office staff. The patient is responsible for co-pay (up to 20% after annual deductible is met) if co-pay is required by the individual health plan.   Patient agreed to services and verbal consent obtained.   Follow up plan: Telephone appointment with care management team member scheduled for:11/23/2021  Noreene Larsson, [place] [place], [place], [place] [address] Direct Dial: [phone] [email or web address] Website: [email or web address]

## 2021-10-19 ENCOUNTER — Other Ambulatory Visit: Payer: Self-pay | Admitting: Family Medicine

## 2021-10-19 DIAGNOSIS — E1169 Type 2 diabetes mellitus with other specified complication: Secondary | ICD-10-CM

## 2021-10-31 ENCOUNTER — Telehealth: Payer: Self-pay | Admitting: Family Medicine

## 2021-10-31 NOTE — Telephone Encounter (Signed)
NO SAMPLES, PT AWARE

## 2021-11-01 DIAGNOSIS — M79676 Pain in unspecified toe(s): Secondary | ICD-10-CM | POA: Diagnosis not present

## 2021-11-01 DIAGNOSIS — B351 Tinea unguium: Secondary | ICD-10-CM | POA: Diagnosis not present

## 2021-11-01 DIAGNOSIS — L84 Corns and callosities: Secondary | ICD-10-CM | POA: Diagnosis not present

## 2021-11-01 DIAGNOSIS — E1151 Type 2 diabetes mellitus with diabetic peripheral angiopathy without gangrene: Secondary | ICD-10-CM | POA: Diagnosis not present

## 2021-11-01 NOTE — Telephone Encounter (Signed)
Pt called back yesterday, see telephone encounter. Will close this encounter.

## 2021-11-04 ENCOUNTER — Encounter: Payer: Self-pay | Admitting: Nurse Practitioner

## 2021-11-04 ENCOUNTER — Ambulatory Visit (INDEPENDENT_AMBULATORY_CARE_PROVIDER_SITE_OTHER): Payer: Medicare HMO | Admitting: Nurse Practitioner

## 2021-11-04 VITALS — BP 111/65 | HR 78 | Temp 97.2°F | Resp 20 | Ht <= 58 in | Wt 118.0 lb

## 2021-11-04 DIAGNOSIS — J0101 Acute recurrent maxillary sinusitis: Secondary | ICD-10-CM | POA: Diagnosis not present

## 2021-11-04 MED ORDER — AMOXICILLIN-POT CLAVULANATE 875-125 MG PO TABS: 1.0000 | ORAL_TABLET | Freq: Two times a day (BID) | ORAL | 0 refills | Status: DC

## 2021-11-04 NOTE — Patient Instructions (Signed)

## 2021-11-04 NOTE — Progress Notes (Signed)
Subjective:    Patient ID: Lori Schroeder, female    DOB: 28-Jan-1956, 66 y.o.   MRN: [id number]   Chief Complaint: Ear Pain (Left/), Nasal Congestion, and Fever   URI  The current episode started in the past 7 days. The problem has been gradually worsening. The maximum temperature recorded prior to her arrival was 100.4 - 100.9 F. The fever has been present for 1 to 2 days. Associated symptoms include congestion, coughing, ear pain, rhinorrhea and a sore throat. Pertinent negatives include no headaches. She has tried decongestant for the symptoms. The treatment provided mild relief.      Review of Systems  Constitutional:  Negative for chills and fever.  HENT:  Positive for congestion, ear pain, rhinorrhea and sore throat.   Respiratory:  Positive for cough.   Musculoskeletal:  Negative for myalgias.  Neurological:  Negative for headaches.      Objective:   Physical Exam Vitals reviewed.  Constitutional:      Appearance: Normal appearance.  HENT:     Head: Normocephalic and atraumatic.     Right Ear: Tympanic membrane normal.     Left Ear: Tympanic membrane normal. There is impacted cerumen.     Nose: Congestion and rhinorrhea present.     Mouth/Throat:     Mouth: Mucous membranes are moist.     Comments: Sinus tenderness  Cardiovascular:     Rate and Rhythm: Normal rate and regular rhythm.  Pulmonary:     Effort: Pulmonary effort is normal.     Breath sounds: Normal breath sounds.  Musculoskeletal:     Cervical back: Normal range of motion and neck supple.  Skin:    General: Skin is warm.  Neurological:     General: No focal deficit present.     Mental Status: She is alert and oriented to person, place, and time.  Psychiatric:        Mood and Affect: Mood normal.        Behavior: Behavior normal.    BP 111/65    Pulse 78    Temp (!) 97.2 F (36.2 C) (Temporal)    Resp 20    Ht 4\' 9"  (1.448 m)    Wt 118 lb (53.5 kg)    SpO2 97%    BMI 25.53 kg/m         Assessment & Plan:  Lori Schroeder in today with chief complaint of URI   1. Acute recurrent maxillary sinusitis 1. Take meds as prescribed 2. Use a cool mist humidifier especially during the winter months and when heat has been humid. 3. Use saline nose sprays frequently 4. Saline irrigations of the nose can be very helpful if done frequently.  * 4X daily for 1 week*  * Use of a nettie pot can be helpful with this. Follow directions with this* 5. Drink plenty of fluids 6. Keep thermostat turn down low 7.For any cough or congestion- delsym 8. For fever or aces or pains- take tylenol or ibuprofen appropriate for age and weight.  * for fevers greater than 101 orally you may alternate ibuprofen and tylenol every  3 hours.   Meds ordered this encounter  Medications   amoxicillin-clavulanate (AUGMENTIN) 875-125 MG tablet    Sig: Take 1 tablet by mouth 2 (two) times daily.    Dispense:  14 tablet    Refill:  0    Order Specific Question:   Supervising Provider    Answer:   Caryl Pina A [id number]  The above assessment and management plan was discussed with the patient. The patient verbalized understanding of and has agreed to the management plan. Patient is aware to call the clinic if symptoms persist or worsen. Patient is aware when to return to the clinic for a follow-up visit. Patient educated on when it is appropriate to go to the emergency department.   Mary-Margaret Hassell Done, FNP

## 2021-11-08 DIAGNOSIS — G8929 Other chronic pain: Secondary | ICD-10-CM | POA: Diagnosis not present

## 2021-11-08 DIAGNOSIS — Z5181 Encounter for therapeutic drug level monitoring: Secondary | ICD-10-CM | POA: Diagnosis not present

## 2021-11-08 DIAGNOSIS — M961 Postlaminectomy syndrome, not elsewhere classified: Secondary | ICD-10-CM | POA: Diagnosis not present

## 2021-11-08 DIAGNOSIS — Z79899 Other long term (current) drug therapy: Secondary | ICD-10-CM | POA: Diagnosis not present

## 2021-11-08 DIAGNOSIS — M545 Low back pain, unspecified: Secondary | ICD-10-CM | POA: Diagnosis not present

## 2021-11-14 ENCOUNTER — Ambulatory Visit (INDEPENDENT_AMBULATORY_CARE_PROVIDER_SITE_OTHER): Payer: Medicare HMO | Admitting: Family Medicine

## 2021-11-14 DIAGNOSIS — J441 Chronic obstructive pulmonary disease with (acute) exacerbation: Secondary | ICD-10-CM

## 2021-11-14 MED ORDER — PREDNISONE 20 MG PO TABS: ORAL_TABLET | ORAL | 0 refills | Status: DC

## 2021-11-14 MED ORDER — DOXYCYCLINE HYCLATE 100 MG PO TABS: 100.0000 mg | ORAL_TABLET | Freq: Two times a day (BID) | ORAL | 0 refills | Status: DC

## 2021-11-14 NOTE — Progress Notes (Signed)
Telephone visit  Subjective: CC: cough PCP: [name], DO IRW:Lori Schroeder is a 66 y.o. female calls for telephone consult today. Patient provides verbal consent for consult held via phone.  Due to COVID-19 pandemic this visit was conducted virtually. This visit type was conducted due to national recommendations for restrictions regarding the COVID-19 Pandemic (e.g. social distancing, sheltering in place) in an effort to limit this patient's exposure and mitigate transmission in our community. All issues noted in this document were discussed and addressed.  A physical exam was not performed with this format.   Location of patient: home Location of provider: [facility] Others present for call: none  1. Cough Patient was seen for URI 11/04/2021.  She reports ongoing cough and intermittent elevations in her temperature.  No hemoptysis or brown sputum.  She is status post 7 days of Augmentin.  She is compliant with her inhalers.  She has not been tested for COVID-19 or influenza.  Symptoms have been ongoing for well over a week and a half now.   ROS: Per HPI  Allergies  Allergen Reactions   Hydroxychloroquine Itching and Rash    Diffuse drug rash   Sulfa Antibiotics    Past Medical History:  Diagnosis Date   Blood transfusion without reported diagnosis    External hemorrhoids without mention of complication [id number]   Colonoscopy   Family history of malignant neoplasm of gastrointestinal tract    Family history of malignant neoplasm of gastrointestinal tract    GERD (gastroesophageal reflux disease)    Hyperlipemia    Hypertension     Current Outpatient Medications:    albuterol (VENTOLIN HFA) 108 (90 Base) MCG/ACT inhaler, Inhale 2 puffs into the lungs every 6 (six) hours as needed for wheezing or shortness of breath., Disp: 8 g, Rfl: 0   amoxicillin-clavulanate (AUGMENTIN) 875-125 MG tablet, Take 1 tablet by mouth 2 (two) times daily., Disp: 14 tablet, Rfl: 0   atorvastatin  (LIPITOR) 10 MG tablet, TAKE 1 TABLET EVERY DAY, Disp: 90 tablet, Rfl: 0   BELBUCA 300 MCG FILM, SMARTSIG:1 Strip(s) By Mouth Every 12 Hours, Disp: , Rfl:    Budeson-Glycopyrrol-Formoterol (BREZTRI AEROSPHERE) 160-9-4.8 MCG/ACT AERO, Inhale 2 puffs into the lungs in the morning and at bedtime., Disp: 10.7 g, Rfl: 0   esomeprazole (NEXIUM) 40 MG capsule, TAKE (1) CAPSULE TWICE DAILY., Disp: 60 capsule, Rfl: 3   fluticasone (FLONASE) 50 MCG/ACT nasal spray, Place 2 sprays into both nostrils daily., Disp: 16 g, Rfl: 6   pregabalin (LYRICA) 300 MG capsule, Take 300 mg by mouth at bedtime., Disp: , Rfl:    pregabalin (LYRICA) 50 MG capsule, Take 100 mg by mouth daily. At suppertime, Disp: , Rfl:    tiZANidine (ZANAFLEX) 4 MG tablet, TAKE ONE TABLET BY MOUTH THREE TIMES DAILY AS NEEDED FOR MUSCLE SPASM, Disp: , Rfl:    TRULICITY 1.5 PJ/0.9TO SOPN, INJECT 1.5MG  ONCE A WEEK, Disp: 2 mL, Rfl: 0  Assessment/ Plan: 66 y.o. female   COPD exacerbation (HCC) - Plan: predniSONE (DELTASONE) 20 MG tablet, doxycycline (VIBRA-TABS) 100 MG tablet  Going to treat as an exacerbation with doxycycline and prednisone.  I fear that she may have had an underlying viral illness like COVID-19 or influenza but unfortunately she is well outside of the treatment window.  For this reason I have not elected to test her.  We discussed red flag signs and symptoms and reasons for reevaluation.  She voiced good understanding will follow-up as needed  Start time:  1:05pm End time: 1:10pm  Total time spent on patient care (including telephone call/ virtual visit): 5 minutes  [place], [place] [phone]

## 2021-11-15 ENCOUNTER — Telehealth: Payer: Self-pay | Admitting: Family Medicine

## 2021-11-15 DIAGNOSIS — J441 Chronic obstructive pulmonary disease with (acute) exacerbation: Secondary | ICD-10-CM

## 2021-11-15 MED ORDER — PREDNISONE 20 MG PO TABS: ORAL_TABLET | ORAL | 0 refills | Status: DC

## 2021-11-15 MED ORDER — DOXYCYCLINE HYCLATE 100 MG PO TABS: 100.0000 mg | ORAL_TABLET | Freq: Two times a day (BID) | ORAL | 0 refills | Status: AC

## 2021-11-15 NOTE — Telephone Encounter (Signed)
Pt aware.

## 2021-11-16 ENCOUNTER — Other Ambulatory Visit: Payer: Self-pay | Admitting: Family Medicine

## 2021-11-16 DIAGNOSIS — E119 Type 2 diabetes mellitus without complications: Secondary | ICD-10-CM

## 2021-11-23 ENCOUNTER — Ambulatory Visit (INDEPENDENT_AMBULATORY_CARE_PROVIDER_SITE_OTHER): Payer: Medicare HMO | Admitting: *Deleted

## 2021-11-23 ENCOUNTER — Telehealth: Payer: Medicare HMO

## 2021-11-23 DIAGNOSIS — J441 Chronic obstructive pulmonary disease with (acute) exacerbation: Secondary | ICD-10-CM

## 2021-11-23 DIAGNOSIS — E1169 Type 2 diabetes mellitus with other specified complication: Secondary | ICD-10-CM

## 2021-11-28 ENCOUNTER — Ambulatory Visit (INDEPENDENT_AMBULATORY_CARE_PROVIDER_SITE_OTHER): Payer: Medicare HMO | Admitting: Family Medicine

## 2021-11-28 ENCOUNTER — Encounter: Payer: Self-pay | Admitting: Family Medicine

## 2021-11-28 VITALS — BP 135/80 | HR 82 | Temp 97.8°F | Ht <= 58 in | Wt 117.2 lb

## 2021-11-28 DIAGNOSIS — E785 Hyperlipidemia, unspecified: Secondary | ICD-10-CM | POA: Diagnosis not present

## 2021-11-28 DIAGNOSIS — E1169 Type 2 diabetes mellitus with other specified complication: Secondary | ICD-10-CM | POA: Diagnosis not present

## 2021-11-28 DIAGNOSIS — Z720 Tobacco use: Secondary | ICD-10-CM

## 2021-11-28 DIAGNOSIS — J410 Simple chronic bronchitis: Secondary | ICD-10-CM | POA: Diagnosis not present

## 2021-11-28 DIAGNOSIS — M2550 Pain in unspecified joint: Secondary | ICD-10-CM | POA: Insufficient documentation

## 2021-11-28 DIAGNOSIS — I152 Hypertension secondary to endocrine disorders: Secondary | ICD-10-CM

## 2021-11-28 DIAGNOSIS — E1159 Type 2 diabetes mellitus with other circulatory complications: Secondary | ICD-10-CM

## 2021-11-28 LAB — BAYER DCA HB A1C WAIVED: HB A1C (BAYER DCA - WAIVED): 6 % — ABNORMAL HIGH (ref 4.8–5.6)

## 2021-11-28 MED ORDER — DICLOFENAC SODIUM 1 % EX GEL: 2.0000 g | Freq: Three times a day (TID) | CUTANEOUS | 99 refills | Status: DC | PRN

## 2021-11-28 NOTE — Patient Instructions (Addendum)
Lori Schroeder or Lori Schroeder should reach out to you about the retinal exam.  Voltaren gel sent. Tumeric is a good natural antiinflammatory.   Diabetes Mellitus and Standards of [place] with and managing diabetes (diabetes mellitus) can be complicated. Your diabetes treatment may be managed by a team of health care providers, including: A physician who specializes in diabetes (endocrinologist). You might also have visits with a nurse practitioner or physician assistant. Nurses. A registered dietitian. A certified diabetes care and education specialist. An exercise specialist. A pharmacist. An eye doctor. A foot specialist (podiatrist). A dental care provider. A primary care provider. A mental health care provider. How to manage your diabetes You can do many things to successfully manage your diabetes. Your health care providers will follow guidelines to help you get the best quality of care. Here are general guidelines for your diabetes management plan. Your health care providers may give you more specific instructions. Physical exams When you are diagnosed with diabetes, and each year after that, your health care provider will ask about your medical and family history. You will have a physical exam, which may include: Measuring your height, weight, and body mass index (BMI). Checking your blood pressure. This will be done at every routine medical visit. Your target blood pressure may vary depending on your medical conditions, your age, and other factors. A thyroid exam. A skin exam. Screening for nerve damage (peripheral neuropathy). This may include checking the pulse in your legs and feet and the level of sensation in your hands and feet. A foot exam to inspect the structure and skin of your feet, including checking for cuts, bruises, redness, blisters, sores, or other problems. Screening for blood vessel (vascular) problems. This may include checking the pulse in your legs and feet and  checking your temperature. Blood tests Depending on your treatment plan and your personal needs, you may have the following tests: Hemoglobin A1C (HbA1C). This test provides information about blood sugar (glucose) control over the previous 2-3 months. It is used to adjust your treatment plan, if needed. This test will be done: At least 2 times a year, if you are meeting your treatment goals. 4 times a year, if you are not meeting your treatment goals or if your goals have changed. Lipid testing, including total cholesterol, LDL and HDL cholesterol, and triglyceride levels. The goal for LDL is less than 100 mg/dL (5.5 mmol/L). If you are at high risk for complications, the goal is less than 70 mg/dL (3.9 mmol/L). The goal for HDL is 40 mg/dL (2.2 mmol/L) or higher for men, and 50 mg/dL (2.8 mmol/L) or higher for women. An HDL cholesterol of 60 mg/dL (3.3 mmol/L) or higher gives some protection against heart disease. The goal for triglycerides is less than 150 mg/dL (8.3 mmol/L). Liver function tests. Kidney function tests. Thyroid function tests.  Dental and eye exams  Visit your dentist two times a year. If you have type 1 diabetes, your health care provider may recommend an eye exam within 5 years after you are diagnosed, and then once a year after your first exam. For children with type 1 diabetes, the health care provider may recommend an eye exam when your child is age 37 or older and has had diabetes for 3-5 years. After the first exam, your child should get an eye exam once a year. If you have type 2 diabetes, your health care provider may recommend an eye exam as soon as you are diagnosed, and then every  1-2 years after your first exam. Immunizations A yearly flu (influenza) vaccine is recommended annually for everyone 6 months or older. This is especially important if you have diabetes. The pneumonia (pneumococcal) vaccine is recommended for everyone 2 years or older who has diabetes.  If you are age 65 or older, you may get the pneumonia vaccine as a series of two separate shots. The hepatitis B vaccine is recommended for adults shortly after being diagnosed with diabetes. Adults and children with diabetes should receive all other vaccines according to age-specific recommendations from the Centers for Disease Control and Prevention (CDC). Mental and emotional health Screening for symptoms of eating disorders, anxiety, and depression is recommended at the time of diagnosis and after as needed. If your screening shows that you have symptoms, you may need more evaluation. You may work with a mental health care provider. Follow these instructions at home: Treatment plan You will monitor your blood glucose levels and may give yourself insulin. Your treatment plan will be reviewed at every medical visit. You and your health care provider will discuss: How you are taking your medicines, including insulin. Any side effects you have. Your blood glucose level target goals. How often you monitor your blood glucose level. Lifestyle habits, such as activity level and tobacco, alcohol, and substance use. Education Your health care provider will assess how well you are monitoring your blood glucose levels and whether you are taking your insulin and medicines correctly. He or she may refer you to: A certified diabetes care and education specialist to manage your diabetes throughout your life, starting at diagnosis. A registered dietitian who can create and review your personal nutrition plan. An exercise specialist who can discuss your activity level and exercise plan. General instructions Take over-the-counter and prescription medicines only as told by your health care provider. Keep all follow-up visits. This is important. Where to find support There are many diabetes support networks, including: American Diabetes Association (ADA): diabetes.org Defeat Diabetes Foundation:  defeatdiabetes.org Where to find more information American Diabetes Association (ADA): www.diabetes.org Association of Diabetes Care & Education Specialists (ADCES): diabeteseducator.org International Diabetes Federation (IDF): https://www.munoz-bell.org/ Summary Managing diabetes (diabetes mellitus) can be complicated. Your diabetes treatment may be managed by a team of health care providers. Your health care providers follow guidelines to help you get the best quality care. You should have physical exams, blood tests, blood pressure monitoring, immunizations, and screening tests regularly. Stay updated on how to manage your diabetes. Your health care providers may also give you more specific instructions based on your individual health. This information is not intended to replace advice given to you by your health care provider. Make sure you discuss any questions you have with your health care provider. Document Revised: 03/25/2020 Document Reviewed: 03/25/2020 Elsevier Patient Education  [place].

## 2021-11-28 NOTE — Progress Notes (Signed)
Subjective: CC:DM PCP: Lori Norlander, DO KWI:OXBDZ Lori Schroeder is a 66 y.o. female presenting to clinic today for:  1. Type 2 Diabetes with hypertension, hyperlipidemia:  Compliant with Trulicity.  She gets this through patient assistance program currently.  She does not report any concerning GI side effects.  Sugars have been relatively well controlled.  Last eye exam: needs Last foot exam: UTD Last A1c:  Lab Results  Component Value Date   HGBA1C 6.2 (H) 08/01/2021   Nephropathy screen indicated?: UTD Last flu, zoster and/or pneumovax:  Immunization History  Administered Date(s) Administered   Influenza-Unspecified 10/02/2018   Pneumococcal Conjugate-13 05/29/2018   Tdap 05/29/2018    ROS: Denies chest pain, shortness of breath, diabetic foot ulcers or visual disturbance.  She is due for eyeglasses.  2.  Polyarthralgia Patient with ongoing pain in her hands and wrist despite use of her pain medications.  She is not able to take oral NSAIDs secondary to GI side effects but wonders if there is something else she can be doing to help with her joint pain.  ROS: Per HPI  Allergies  Allergen Reactions   Hydroxychloroquine Itching and Rash    Diffuse drug rash   Sulfa Antibiotics    Past Medical History:  Diagnosis Date   Blood transfusion without reported diagnosis    External hemorrhoids without mention of complication [id number]   Colonoscopy   Family history of malignant neoplasm of gastrointestinal tract    Family history of malignant neoplasm of gastrointestinal tract    GERD (gastroesophageal reflux disease)    Hyperlipemia    Hypertension     Current Outpatient Medications:    albuterol (VENTOLIN HFA) 108 (90 Base) MCG/ACT inhaler, Inhale 2 puffs into the lungs every 6 (six) hours as needed for wheezing or shortness of breath., Disp: 8 g, Rfl: 0   atorvastatin (LIPITOR) 10 MG tablet, TAKE 1 TABLET EVERY DAY, Disp: 90 tablet, Rfl: 0   BELBUCA 300 MCG FILM,  SMARTSIG:1 Strip(s) By Mouth Every 12 Hours, Disp: , Rfl:    Budeson-Glycopyrrol-Formoterol (BREZTRI AEROSPHERE) 160-9-4.8 MCG/ACT AERO, Inhale 2 puffs into the lungs in the morning and at bedtime., Disp: 10.7 g, Rfl: 0   Dulaglutide (TRULICITY) 1.5 AS/3.4HD SOPN, INJECT 1.5MG  ONCE A WEEK (NEEDS TO BE SEEN BEFORE NEXT REFILL), Disp: 2 mL, Rfl: 0   esomeprazole (NEXIUM) 40 MG capsule, TAKE (1) CAPSULE TWICE DAILY., Disp: 60 capsule, Rfl: 3   fluticasone (FLONASE) 50 MCG/ACT nasal spray, Place 2 sprays into both nostrils daily., Disp: 16 g, Rfl: 6   predniSONE (DELTASONE) 20 MG tablet, 2 po at same time daily for 5 days, Disp: 10 tablet, Rfl: 0   pregabalin (LYRICA) 300 MG capsule, Take 300 mg by mouth at bedtime., Disp: , Rfl:    pregabalin (LYRICA) 50 MG capsule, Take 100 mg by mouth daily. At suppertime, Disp: , Rfl:    tiZANidine (ZANAFLEX) 4 MG tablet, TAKE ONE TABLET BY MOUTH THREE TIMES DAILY AS NEEDED FOR MUSCLE SPASM, Disp: , Rfl:  Social History   Socioeconomic History   Marital status: Married    Spouse name: Lori Schroeder   Number of children: 2   Years of education: 14   Highest education level: Associate degree: occupational, Hotel manager, or vocational program  Occupational History   Occupation: disability    Employer: DISABLED  Tobacco Use   Smoking status: Every Day    Packs/day: 0.50    Years: 10.00    Pack years: 5.00  Types: Cigarettes   Smokeless tobacco: Never  Vaping Use   Vaping Use: Never used  Substance and Sexual Activity   Alcohol use: No   Drug use: No   Sexual activity: Not on file  Other Topics Concern   Not on file  Social History Narrative   Not on file   Social Determinants of Health   Financial Resource Strain: Low Risk    Difficulty of Paying Living Expenses: Not hard at all  Food Insecurity: No Food Insecurity   Worried About Charity fundraiser in the Last Year: Never true   [place] in the Last Year: Never true  Transportation Needs:  No Transportation Needs   Lack of Transportation (Medical): No   Lack of Transportation (Non-Medical): No  Physical Activity: Insufficiently Active   Days of Exercise per Week: 7 days   Minutes of Exercise per Session: 10 min  Stress: No Stress Concern Present   Feeling of Stress : Not at all  Social Connections: Moderately Integrated   Frequency of Communication with Friends and Family: More than three times a week   Frequency of Social Gatherings with Friends and Family: More than three times a week   Attends Religious Services: More than 4 times per year   Active Member of [employer] or Organizations: No   Attends Music therapist: Never   Marital Status: Married  Human resources officer Violence: Not At Risk   Fear of Current or Ex-Partner: No   Emotionally Abused: No   Physically Abused: No   Sexually Abused: No   Family History  Problem Relation Age of Onset   Colon cancer Maternal Grandmother    Arthritis Maternal Grandmother    Alzheimer's disease Mother    Hyperlipidemia Mother    Hypertension Mother    Cancer Father    Alcohol abuse Father    Diabetes Daughter        Type 1   Diabetes Son    Breast cancer Neg Hx    Celiac disease Neg Hx    Cirrhosis Neg Hx    Clotting disorder Neg Hx    Colitis Neg Hx    Colon polyps Neg Hx    Crohn's disease Neg Hx    Cystic fibrosis Neg Hx    Esophageal cancer Neg Hx    Heart disease Neg Hx    Hemochromatosis Neg Hx    Inflammatory bowel disease Neg Hx    Irritable bowel syndrome Neg Hx    Kidney disease Neg Hx    Liver cancer Neg Hx    Liver disease Neg Hx    Ovarian cancer Neg Hx    Pancreatic cancer Neg Hx    Prostate cancer Neg Hx    Rectal cancer Neg Hx    Stomach cancer Neg Hx    Ulcerative colitis Neg Hx    Uterine cancer Neg Hx    Wilson's disease Neg Hx     Objective: Office vital signs reviewed. BP 135/80    Pulse 82    Temp 97.8 F (36.6 C)    Ht 4\' 9"  (1.448 m)    Wt 117 lb 3.2 oz (53.2 kg)     SpO2 98%    BMI 25.36 kg/m   Physical Examination:  General: Awake, alert, well nourished, smells of tobacco. No acute distress HEENT: sclera white, MMM, PERRLA, EOMI Cardio: regular rate and rhythm, S1S2 heard, no murmurs appreciated Pulm: clear to auscultation bilaterally, no wheezes, rhonchi or  rales; normal work of breathing on room air MSK: antalgic gait and station, arthritic changes of the hands bilaterally  Assessment/ Plan: 66 y.o. female   Controlled type 2 diabetes mellitus with other specified complication, without long-term current use of insulin ([place]) - Plan: Bayer DCA Hb A1c Waived  Hyperlipidemia associated with type 2 diabetes mellitus ([place])  Hypertension associated with diabetes ([place])  Polyarthralgia - Plan: diclofenac Sodium (VOLTAREN) 1 % GEL  Simple chronic bronchitis (HCC)  Tobacco use  Continue current regimen with Trulicity.  We discussed that there is a potential that Trulicity will not be eligible for patient assistance this year.  She is to get her diabetic eye exam and I have messaged [facility] to see if we can arrange retinal exam here in office.  Not yet due for fasting lipid.  Continue Lipitor 10 mg  Blood pressure is diet controlled.  Trial of topical Voltaren gel to hands since her pain medications are not very effective at this time.  Turmeric OTC also recommended  Sample of Breztri provided today.  She should be getting this through patient assistance through the mail  Orders Placed This Encounter  Procedures   Bayer [place] Hb A1c Waived   No orders of the defined types were placed in this encounter.    Lori Norlander, DO [place] [phone]

## 2021-11-29 DIAGNOSIS — E1169 Type 2 diabetes mellitus with other specified complication: Secondary | ICD-10-CM

## 2021-11-29 DIAGNOSIS — J441 Chronic obstructive pulmonary disease with (acute) exacerbation: Secondary | ICD-10-CM

## 2021-12-08 ENCOUNTER — Telehealth: Payer: Self-pay | Admitting: Pharmacist

## 2021-12-08 DIAGNOSIS — J410 Simple chronic bronchitis: Secondary | ICD-10-CM

## 2021-12-08 MED ORDER — BREZTRI AEROSPHERE 160-9-4.8 MCG/ACT IN AERO: 2.0000 | INHALATION_SPRAY | Freq: Two times a day (BID) | RESPIRATORY_TRACT | 6 refills | Status: DC

## 2021-12-08 NOTE — Telephone Encounter (Signed)
Breztri patient assistance application filled out and faxed to [place] patient assistance program ?Escribed to [facility] per program requirements ?

## 2021-12-15 ENCOUNTER — Other Ambulatory Visit: Payer: Self-pay | Admitting: Family Medicine

## 2021-12-27 ENCOUNTER — Telehealth: Payer: Self-pay | Admitting: Family Medicine

## 2021-12-27 DIAGNOSIS — M79673 Pain in unspecified foot: Secondary | ICD-10-CM

## 2021-12-27 NOTE — Telephone Encounter (Signed)
REFERRAL REQUEST ?Telephone Note ? ?Have you been seen at our office for this problem? yes ?(Advise that they may need an appointment with their PCP before a referral can be done) ? ?Reason for Referral: Foot/Ankle not resolved ?Referral discussed with patient: yes  ?Best contact number of patient for referral team: #   [phone] ?Has patient been seen by a specialist for this issue before: yes  ?Patient provider preference for referral: Dr Caprice Beaver ?Patient location preference for referral: [facility] ?  ?Patient notified that referrals can take up to a week or longer to process. If they haven't heard anything within a week they should call back and speak with the referral department.   ? ?[facility]'s pt. ? ?She went to see Dr Irving Shows for the past year & she wants second opinion. ? ?Please call pt. ?

## 2022-01-18 DIAGNOSIS — M961 Postlaminectomy syndrome, not elsewhere classified: Secondary | ICD-10-CM | POA: Diagnosis not present

## 2022-01-18 DIAGNOSIS — M533 Sacrococcygeal disorders, not elsewhere classified: Secondary | ICD-10-CM | POA: Diagnosis not present

## 2022-01-18 DIAGNOSIS — M545 Low back pain, unspecified: Secondary | ICD-10-CM | POA: Diagnosis not present

## 2022-01-18 DIAGNOSIS — M5416 Radiculopathy, lumbar region: Secondary | ICD-10-CM | POA: Diagnosis not present

## 2022-01-19 ENCOUNTER — Ambulatory Visit: Payer: Medicare HMO | Admitting: Podiatry

## 2022-01-25 ENCOUNTER — Telehealth: Payer: Self-pay

## 2022-01-25 NOTE — Telephone Encounter (Signed)
Received RE-ENROLLMENT notification from [employer] regarding approval for BREZTRI 160. Patient assistance approved from 01/25/22 to 10/01/22. ? ?LAST SHIPMENT DELIVERED 12/25/21, NEXT SHIPMENT 02/12/22 ? ?Phone: [phone] ? ?

## 2022-01-26 ENCOUNTER — Other Ambulatory Visit: Payer: Self-pay | Admitting: Family Medicine

## 2022-01-26 DIAGNOSIS — E1169 Type 2 diabetes mellitus with other specified complication: Secondary | ICD-10-CM

## 2022-01-27 ENCOUNTER — Encounter: Payer: Self-pay | Admitting: Family Medicine

## 2022-01-27 ENCOUNTER — Ambulatory Visit (INDEPENDENT_AMBULATORY_CARE_PROVIDER_SITE_OTHER): Payer: Medicare HMO | Admitting: Family Medicine

## 2022-01-27 VITALS — BP 114/67 | HR 82 | Temp 98.2°F | Ht <= 58 in | Wt 119.0 lb

## 2022-01-27 DIAGNOSIS — R6889 Other general symptoms and signs: Secondary | ICD-10-CM

## 2022-01-27 DIAGNOSIS — J069 Acute upper respiratory infection, unspecified: Secondary | ICD-10-CM | POA: Diagnosis not present

## 2022-01-27 DIAGNOSIS — R062 Wheezing: Secondary | ICD-10-CM

## 2022-01-27 DIAGNOSIS — J029 Acute pharyngitis, unspecified: Secondary | ICD-10-CM

## 2022-01-27 LAB — RAPID STREP SCREEN (MED CTR MEBANE ONLY): Strep Gp A Ag, IA W/Reflex: NEGATIVE

## 2022-01-27 LAB — CULTURE, GROUP A STREP

## 2022-01-27 MED ORDER — GUAIFENESIN ER 600 MG PO TB12: 600.0000 mg | ORAL_TABLET | Freq: Two times a day (BID) | ORAL | 0 refills | Status: AC

## 2022-01-27 MED ORDER — DOXYCYCLINE HYCLATE 100 MG PO TABS: 100.0000 mg | ORAL_TABLET | Freq: Two times a day (BID) | ORAL | 0 refills | Status: AC

## 2022-01-27 MED ORDER — PREDNISONE 20 MG PO TABS: 40.0000 mg | ORAL_TABLET | Freq: Every day | ORAL | 0 refills | Status: AC

## 2022-01-27 NOTE — Progress Notes (Signed)
?  ? ?Subjective:  ?Patient ID: Lori Schroeder, female    DOB: 17-Mar-1956, 66 y.o.   MRN: [id number] ? ?Patient Care Team: ?[name], DO as PCP - General (Family Medicine) ?Iran Planas, MD as Consulting Physician (Orthopedic Surgery) ?Adline Potter, NP as Nurse Practitioner (Anesthesiology) ?[facility], [facility] ?Harlen Labs, MD as Referring Physician (Optometry) ?[facility], [facility] ?Lavera Guise, [place] as [facility] (Pharmacist)  ? ?Chief Complaint:  flu/cold like symptoms (Sore throat/Congestion/SOB/Left ear pain) ? ? ?HPI: ?Lori Schroeder is a 66 y.o. female presenting on 01/27/2022 for flu/cold like symptoms (Sore throat/Congestion/SOB/Left ear pain) ? ? ?Pt presents today with complaints of cough, congestion, wheezing, sore throat, fever, chills, malaise, left otalgia, and greenish-yellow sputum production. She has not been taking anything for her symptoms. Onset 5 days ago. Denies known sick contacts. Has been using albuterol more than normal due to wheezing and cough.  ? ? ? ?Relevant past medical, surgical, family, and social history reviewed and updated as indicated.  ?Allergies and medications reviewed and updated. Data reviewed: Chart in Epic. ? ? ?Past Medical History:  ?Diagnosis Date  ? Blood transfusion without reported diagnosis   ? External hemorrhoids without mention of complication [id number]  ? Colonoscopy  ? Family history of malignant neoplasm of gastrointestinal tract   ? Family history of malignant neoplasm of gastrointestinal tract   ? GERD (gastroesophageal reflux disease)   ? Hyperlipemia   ? Hypertension   ? ? ?Past Surgical History:  ?Procedure Laterality Date  ? ABDOMINAL HYSTERECTOMY    ? BACK SURGERY    ? BLADDER SURGERY    ? Bladder Tact  ? CARPAL TUNNEL RELEASE Bilateral   ? COLONOSCOPY    ? FINGER SURGERY Right   ? Small finger fracture surgery  ? NECK SURGERY    ? ? ?Social History  ? ?Socioeconomic History  ? Marital status:  Married  ?  Spouse name: Christia Reading  ? Number of children: 2  ? Years of education: 38  ? Highest education level: Associate degree: occupational, Hotel manager, or vocational program  ?Occupational History  ? Occupation: disability  ?  Employer: DISABLED  ?Tobacco Use  ? Smoking status: Every Day  ?  Packs/day: 0.50  ?  Years: 10.00  ?  Pack years: 5.00  ?  Types: Cigarettes  ? Smokeless tobacco: Never  ?Vaping Use  ? Vaping Use: Never used  ?Substance and Sexual Activity  ? Alcohol use: No  ? Drug use: No  ? Sexual activity: Not on file  ?Other Topics Concern  ? Not on file  ?Social History Narrative  ? Not on file  ? ?Social Determinants of Health  ? ?Financial Resource Strain: Low Risk   ? Difficulty of Paying Living Expenses: Not hard at all  ?Food Insecurity: No Food Insecurity  ? Worried About Charity fundraiser in the Last Year: Never true  ? Ran Out of Food in the Last Year: Never true  ?Transportation Needs: No Transportation Needs  ? Lack of Transportation (Medical): No  ? Lack of Transportation (Non-Medical): No  ?Physical Activity: Insufficiently Active  ? Days of Exercise per Week: 7 days  ? Minutes of Exercise per Session: 10 min  ?Stress: No Stress Concern Present  ? Feeling of Stress : Not at all  ?Social Connections: Moderately Integrated  ? Frequency of Communication with Friends and Family: More than three times a week  ? Frequency of Social Gatherings with  Friends and Family: More than three times a week  ? Attends Religious Services: More than 4 times per year  ? Active Member of Clubs or Organizations: No  ? Attends Archivist Meetings: Never  ? Marital Status: Married  ?Intimate Partner Violence: Not At Risk  ? Fear of Current or Ex-Partner: No  ? Emotionally Abused: No  ? Physically Abused: No  ? Sexually Abused: No  ? ? ?Outpatient Encounter Medications as of 01/27/2022  ?Medication Sig  ? albuterol (VENTOLIN HFA) 108 (90 Base) MCG/ACT inhaler Inhale 2 puffs into the lungs every 6 (six)  hours as needed for wheezing or shortness of breath.  ? atorvastatin (LIPITOR) 10 MG tablet TAKE 1 TABLET EVERY DAY  ? BELBUCA 300 MCG FILM SMARTSIG:1 Strip(s) By Mouth Every 12 Hours  ? BELBUCA 450 MCG FILM SMARTSIG:1 Strip(s) By Mouth Every 12 Hours  ? Budeson-Glycopyrrol-Formoterol (BREZTRI AEROSPHERE) 160-9-4.8 MCG/ACT AERO Inhale 2 puffs into the lungs in the morning and at bedtime.  ? diclofenac Sodium (VOLTAREN) 1 % GEL Apply 2 g topically 3 (three) times daily as needed (arthritis).  ? doxycycline (VIBRA-TABS) 100 MG tablet Take 1 tablet (100 mg total) by mouth 2 (two) times daily for 10 days. 1 po bid  ? Dulaglutide (TRULICITY) 1.5 KP/5.4SF SOPN Inject 1.5 mg into the skin once a week. INJECT 1.'5MG'$  ONCE A WEEK  ? esomeprazole (NEXIUM) 40 MG capsule TAKE (1) CAPSULE TWICE DAILY.  ? fluticasone (FLONASE) 50 MCG/ACT nasal spray Place 2 sprays into both nostrils daily.  ? guaiFENesin (MUCINEX) 600 MG 12 hr tablet Take 1 tablet (600 mg total) by mouth 2 (two) times daily for 15 days.  ? naloxone (NARCAN) nasal spray 4 mg/0.1 mL See admin instructions.  ? predniSONE (DELTASONE) 20 MG tablet Take 2 tablets (40 mg total) by mouth daily with breakfast for 5 days.  ? pregabalin (LYRICA) 300 MG capsule Take 300 mg by mouth at bedtime.  ? pregabalin (LYRICA) 50 MG capsule Take 100 mg by mouth daily. At suppertime  ? tiZANidine (ZANAFLEX) 4 MG tablet TAKE ONE TABLET BY MOUTH THREE TIMES DAILY AS NEEDED FOR MUSCLE SPASM  ? ?No facility-administered encounter medications on file as of 01/27/2022.  ? ? ?Allergies  ?Allergen Reactions  ? Hydroxychloroquine Itching and Rash  ?  Diffuse drug rash  ? Sulfa Antibiotics   ? ? ?Review of Systems  ?Constitutional:  Positive for activity change, chills, fatigue and fever. Negative for appetite change, diaphoresis and unexpected weight change.  ?HENT:  Positive for congestion, ear pain and sore throat. Negative for dental problem, drooling, ear discharge, facial swelling, hearing  loss, mouth sores, nosebleeds, postnasal drip, rhinorrhea, sinus pressure, sinus pain, sneezing, tinnitus, trouble swallowing and voice change.   ?Eyes: Negative.  Negative for photophobia and visual disturbance.  ?Respiratory:  Positive for cough and wheezing. Negative for apnea, choking, chest tightness, shortness of breath and stridor.   ?Cardiovascular:  Negative for chest pain, palpitations and leg swelling.  ?Gastrointestinal:  Negative for abdominal distention, abdominal pain, anal bleeding, blood in stool, constipation, diarrhea, nausea and vomiting.  ?Endocrine: Negative.   ?Genitourinary:  Negative for decreased urine volume, difficulty urinating, dysuria, frequency and urgency.  ?Musculoskeletal:  Negative for arthralgias and myalgias.  ?Skin: Negative.   ?Allergic/Immunologic: Negative.   ?Neurological:  Negative for dizziness, tremors, seizures, syncope, facial asymmetry, speech difficulty, weakness, light-headedness, numbness and headaches.  ?Hematological: Negative.   ?Psychiatric/Behavioral:  Negative for confusion, hallucinations, sleep disturbance and suicidal ideas.   ?  All other systems reviewed and are negative. ? ?   ? ?Objective:  ?BP 114/67   Pulse 82   Temp 98.2 ?F (36.8 ?C)   Ht '4\' 9"'$  (1.448 m)   Wt 119 lb (54 kg)   SpO2 94%   BMI 25.75 kg/m?   ? ?Wt Readings from Last 3 Encounters:  ?01/27/22 119 lb (54 kg)  ?11/28/21 117 lb 3.2 oz (53.2 kg)  ?11/04/21 118 lb (53.5 kg)  ? ? ?Physical Exam ?Vitals and nursing note reviewed.  ?Constitutional:   ?   General: She is not in acute distress. ?   Appearance: Normal appearance. She is well-developed and well-groomed. She is not ill-appearing, toxic-appearing or diaphoretic.  ?HENT:  ?   Head: Normocephalic and atraumatic.  ?   Jaw: There is normal jaw occlusion.  ?   Right Ear: Hearing, ear canal and external ear normal. A middle ear effusion is present. Tympanic membrane is not erythematous.  ?   Left Ear: Hearing, ear canal and external ear  normal. A middle ear effusion is present. Tympanic membrane is not erythematous.  ?   Nose: Congestion present.  ?   Mouth/Throat:  ?   Lips: Pink.  ?   Mouth: Mucous membranes are moist.  ?   Pharynx: Oropharyn

## 2022-01-28 LAB — COVID-19, FLU A+B AND RSV
Influenza A, NAA: NOT DETECTED
Influenza B, NAA: NOT DETECTED
RSV, NAA: NOT DETECTED
SARS-CoV-2, NAA: NOT DETECTED

## 2022-02-02 ENCOUNTER — Telehealth: Payer: Self-pay | Admitting: Pharmacist

## 2022-02-02 DIAGNOSIS — J410 Simple chronic bronchitis: Secondary | ICD-10-CM

## 2022-02-02 MED ORDER — BREZTRI AEROSPHERE 160-9-4.8 MCG/ACT IN AERO: 2.0000 | INHALATION_SPRAY | Freq: Two times a day (BID) | RESPIRATORY_TRACT | 6 refills | Status: DC

## 2022-02-02 NOTE — Telephone Encounter (Signed)
BREZTRI REFILLS SENT TO [employer] PATIENT ASSISTANCE VIA [facility] ?

## 2022-02-06 DIAGNOSIS — H524 Presbyopia: Secondary | ICD-10-CM | POA: Diagnosis not present

## 2022-02-07 ENCOUNTER — Telehealth: Payer: Self-pay | Admitting: Family Medicine

## 2022-02-07 NOTE — Telephone Encounter (Signed)
Patient calling to see if we have Trulicity 1.5 samples. Please call back.  ?

## 2022-02-07 NOTE — Telephone Encounter (Signed)
Patient is on patient assistance and has not received yet. I advised patient to be on the lookout and let us know if she doesn't receive.  ? ? ?

## 2022-02-07 NOTE — Telephone Encounter (Signed)
We are no longer receiving trulicity samples due to backorder ?Is patient not receiving from patient assistance? ?She can make CCM appt to try and get trulicity or ozempic--whichever one we can obtain ? ?If patient needs appt, please route to amber wray for CCM scheduling ? ?Thanks! Manual Navarra ?

## 2022-02-11 DIAGNOSIS — H5213 Myopia, bilateral: Secondary | ICD-10-CM | POA: Diagnosis not present

## 2022-02-22 ENCOUNTER — Encounter: Payer: Self-pay | Admitting: Family Medicine

## 2022-02-22 ENCOUNTER — Ambulatory Visit (INDEPENDENT_AMBULATORY_CARE_PROVIDER_SITE_OTHER): Payer: Medicare HMO | Admitting: Family Medicine

## 2022-02-22 VITALS — BP 127/72 | HR 67 | Temp 97.9°F | Ht <= 58 in | Wt 117.8 lb

## 2022-02-22 DIAGNOSIS — M961 Postlaminectomy syndrome, not elsewhere classified: Secondary | ICD-10-CM

## 2022-02-22 DIAGNOSIS — G894 Chronic pain syndrome: Secondary | ICD-10-CM

## 2022-02-22 DIAGNOSIS — L739 Follicular disorder, unspecified: Secondary | ICD-10-CM

## 2022-02-22 DIAGNOSIS — M2550 Pain in unspecified joint: Secondary | ICD-10-CM | POA: Diagnosis not present

## 2022-02-22 DIAGNOSIS — J410 Simple chronic bronchitis: Secondary | ICD-10-CM

## 2022-02-22 MED ORDER — ALBUTEROL SULFATE HFA 108 (90 BASE) MCG/ACT IN AERS: 2.0000 | INHALATION_SPRAY | Freq: Four times a day (QID) | RESPIRATORY_TRACT | 0 refills | Status: DC | PRN

## 2022-02-22 MED ORDER — CLINDAMYCIN PHOSPHATE 1 % EX SOLN: Freq: Two times a day (BID) | CUTANEOUS | 12 refills | Status: DC

## 2022-02-22 NOTE — Progress Notes (Signed)
Subjective: CC: Polyarthralgia, chronic pain syndrome, rash PCP: [name], DO NWG:NFAOZ Sze is a 66 y.o. female presenting to clinic today for:  1.  Polyarthralgia and history of chronic pain syndrome Patient is currently under the care of of pain specialist in [place].  She notes that she has tried multiple modalities of treatment but unfortunately her pain remains uncontrolled and she would like to get a second opinion elsewhere as she has been with this provider for over 10 years now.  She has undergone multiple injection therapies neither they do not work or the only lasted a couple of weeks before wearing off.  Pain interferes with her ability to function.  She is currently being treated with Belbuca and Lyrica  Her polyarthralgia has been worked up by rheumatology in the past and she was tried on Plaquenil but this caused quite a bit of a side effect so it was discontinued.  She has since not returned because she did not feel that she was getting the care that she needed and would like to have a second opinion elsewhere if possible.  She does not want to become like her mother who became severely debilitated from arthritis.  2.  Rash Patient reports a rash that intermittently pops up on her neck and shoulders.  It itches and she will pick at it.  Not currently utilizing any new perfumes, lotions, hair products.  Washes with Suave.  3.  Chronic bronchitis Needs refill on her albuterol puffer.  Has been having some flareups since the change in season   ROS: Per HPI  Allergies  Allergen Reactions   Hydroxychloroquine Itching and Rash    Diffuse drug rash   Sulfa Antibiotics    Past Medical History:  Diagnosis Date   Blood transfusion without reported diagnosis    External hemorrhoids without mention of complication [id number]   Colonoscopy   Family history of malignant neoplasm of gastrointestinal tract    Family history of malignant neoplasm of  gastrointestinal tract    GERD (gastroesophageal reflux disease)    Hyperlipemia    Hypertension     Current Outpatient Medications:    albuterol (VENTOLIN HFA) 108 (90 Base) MCG/ACT inhaler, Inhale 2 puffs into the lungs every 6 (six) hours as needed for wheezing or shortness of breath., Disp: 8 g, Rfl: 0   atorvastatin (LIPITOR) 10 MG tablet, TAKE 1 TABLET EVERY DAY, Disp: 90 tablet, Rfl: 0   BELBUCA 450 MCG FILM, SMARTSIG:1 Strip(s) By Mouth Every 12 Hours, Disp: , Rfl:    Budeson-Glycopyrrol-Formoterol (BREZTRI AEROSPHERE) 160-9-4.8 MCG/ACT AERO, Inhale 2 puffs into the lungs in the morning and at bedtime., Disp: 32.1 g, Rfl: 6   Dulaglutide (TRULICITY) 1.5 IO/9.6EX SOPN, Inject 1.5 mg into the skin once a week. INJECT 1.'5MG'$  ONCE A WEEK, Disp: 6 mL, Rfl: 1   esomeprazole (NEXIUM) 40 MG capsule, TAKE (1) CAPSULE TWICE DAILY., Disp: 60 capsule, Rfl: 3   fluticasone (FLONASE) 50 MCG/ACT nasal spray, Place 2 sprays into both nostrils daily., Disp: 16 g, Rfl: 6   pregabalin (LYRICA) 100 MG capsule, Take 100 mg by mouth 2 (two) times daily., Disp: , Rfl:    pregabalin (LYRICA) 300 MG capsule, Take 300 mg by mouth at bedtime., Disp: , Rfl:    tiZANidine (ZANAFLEX) 4 MG tablet, TAKE ONE TABLET BY MOUTH THREE TIMES DAILY AS NEEDED FOR MUSCLE SPASM, Disp: , Rfl:    naloxone (NARCAN) nasal spray 4 mg/0.1 mL, See admin instructions. (Patient not taking:  Reported on 02/22/2022), Disp: , Rfl:  Social History   Socioeconomic History   Marital status: Married    Spouse name: Christia Reading   Number of children: 2   Years of education: 14   Highest education level: Associate degree: occupational, Hotel manager, or vocational program  Occupational History   Occupation: disability    Employer: DISABLED  Tobacco Use   Smoking status: Every Day    Packs/day: 0.50    Years: 10.00    Pack years: 5.00    Types: Cigarettes   Smokeless tobacco: Never  Vaping Use   Vaping Use: Never used  Substance and Sexual  Activity   Alcohol use: No   Drug use: No   Sexual activity: Not on file  Other Topics Concern   Not on file  Social History Narrative   Not on file   Social Determinants of Health   Financial Resource Strain: Low Risk    Difficulty of Paying Living Expenses: Not hard at all  Food Insecurity: No Food Insecurity   Worried About Charity fundraiser in the Last Year: Never true   [place] in the Last Year: Never true  Transportation Needs: No Transportation Needs   Lack of Transportation (Medical): No   Lack of Transportation (Non-Medical): No  Physical Activity: Insufficiently Active   Days of Exercise per Week: 7 days   Minutes of Exercise per Session: 10 min  Stress: No Stress Concern Present   Feeling of Stress : Not at all  Social Connections: Moderately Integrated   Frequency of Communication with Friends and Family: More than three times a week   Frequency of Social Gatherings with Friends and Family: More than three times a week   Attends Religious Services: More than 4 times per year   Active Member of [employer] or Organizations: No   Attends Music therapist: Never   Marital Status: Married  Human resources officer Violence: Not At Risk   Fear of Current or Ex-Partner: No   Emotionally Abused: No   Physically Abused: No   Sexually Abused: No   Family History  Problem Relation Age of Onset   Colon cancer Maternal Grandmother    Arthritis Maternal Grandmother    Alzheimer's disease Mother    Hyperlipidemia Mother    Hypertension Mother    Cancer Father    Alcohol abuse Father    Diabetes Daughter        Type 1   Diabetes Son    Breast cancer Neg Hx    Celiac disease Neg Hx    Cirrhosis Neg Hx    Clotting disorder Neg Hx    Colitis Neg Hx    Colon polyps Neg Hx    Crohn's disease Neg Hx    Cystic fibrosis Neg Hx    Esophageal cancer Neg Hx    Heart disease Neg Hx    Hemochromatosis Neg Hx    Inflammatory bowel disease Neg Hx    Irritable bowel  syndrome Neg Hx    Kidney disease Neg Hx    Liver cancer Neg Hx    Liver disease Neg Hx    Ovarian cancer Neg Hx    Pancreatic cancer Neg Hx    Prostate cancer Neg Hx    Rectal cancer Neg Hx    Stomach cancer Neg Hx    Ulcerative colitis Neg Hx    Uterine cancer Neg Hx    Wilson's disease Neg Hx     Objective: Office  vital signs reviewed. BP 127/72   Pulse 67   Temp 97.9 F (36.6 C)   Ht '4\' 9"'$  (1.448 m)   Wt 117 lb 12.8 oz (53.4 kg)   SpO2 95%   BMI 25.49 kg/m   Physical Examination:  General: Awake, alert, well-appearing female.  No acute distress, No acute distress HEENT: Sclera white.  Moist mucous membranes Cardio: regular rate and rhythm, S1S2 heard, no murmurs appreciated Pulm: clear to auscultation bilaterally, no wheezes, rhonchi or rales; normal work of breathing on room air Extremities: warm, well perfused, No edema, cyanosis or clubbing; +2 pulses bilaterally MSK: Normal gait and station.  Ambulating independently.  Bilateral hands with arthritic changes and deformity. Skin: Follicular lesions noted along the shoulders  Assessment/ Plan: 66 y.o. female   Polyarthralgia - Plan: Ambulatory referral to Rheumatology, Ambulatory referral to Pain Clinic  Chronic pain syndrome - Plan: Ambulatory referral to Rheumatology, Ambulatory referral to Pain Clinic  Post laminectomy syndrome - Plan: Ambulatory referral to Rheumatology, Ambulatory referral to Pain Clinic  Simple chronic bronchitis ([place]) - Plan: albuterol (VENTOLIN HFA) 108 (90 Base) MCG/ACT inhaler  Folliculitis - Plan: clindamycin (CLEOCIN-T) 1 % external solution  Referral to new pain clinic and the rheumatologist for second opinions have been ordered.  We discussed that she would likely need to get release of information forms completed from the previous clinics sent over to the new clinics when she has been secured appointments  Albuterol inhaler has been renewed  Lesions of concern appear to be  consistent with folliculitis and clindamycin topically has been sent in for treatment  No orders of the defined types were placed in this encounter.  No orders of the defined types were placed in this encounter.    [name], DO [place] [phone]

## 2022-03-15 DIAGNOSIS — Z5181 Encounter for therapeutic drug level monitoring: Secondary | ICD-10-CM | POA: Diagnosis not present

## 2022-03-15 DIAGNOSIS — M961 Postlaminectomy syndrome, not elsewhere classified: Secondary | ICD-10-CM | POA: Diagnosis not present

## 2022-03-15 DIAGNOSIS — Z79899 Other long term (current) drug therapy: Secondary | ICD-10-CM | POA: Diagnosis not present

## 2022-03-15 DIAGNOSIS — M533 Sacrococcygeal disorders, not elsewhere classified: Secondary | ICD-10-CM | POA: Diagnosis not present

## 2022-03-15 DIAGNOSIS — M545 Low back pain, unspecified: Secondary | ICD-10-CM | POA: Diagnosis not present

## 2022-03-21 DIAGNOSIS — G894 Chronic pain syndrome: Secondary | ICD-10-CM | POA: Diagnosis not present

## 2022-04-03 ENCOUNTER — Telehealth: Payer: Self-pay | Admitting: Family Medicine

## 2022-04-03 NOTE — Telephone Encounter (Signed)
Patient wants to know if her referral for pain clinic can be sent to [facility] on [address], was discussed during appt with Dr Lajuana Ripple per patient.

## 2022-04-18 ENCOUNTER — Ambulatory Visit: Payer: Medicare HMO | Admitting: Family Medicine

## 2022-04-23 DIAGNOSIS — G8929 Other chronic pain: Secondary | ICD-10-CM | POA: Diagnosis not present

## 2022-04-23 DIAGNOSIS — Z0130 Encounter for examination of blood pressure without abnormal findings: Secondary | ICD-10-CM | POA: Diagnosis not present

## 2022-04-23 DIAGNOSIS — M2550 Pain in unspecified joint: Secondary | ICD-10-CM | POA: Diagnosis not present

## 2022-04-23 DIAGNOSIS — Z1159 Encounter for screening for other viral diseases: Secondary | ICD-10-CM | POA: Diagnosis not present

## 2022-04-23 DIAGNOSIS — F1190 Opioid use, unspecified, uncomplicated: Secondary | ICD-10-CM | POA: Diagnosis not present

## 2022-04-23 DIAGNOSIS — Z131 Encounter for screening for diabetes mellitus: Secondary | ICD-10-CM | POA: Diagnosis not present

## 2022-04-23 DIAGNOSIS — M5137 Other intervertebral disc degeneration, lumbosacral region: Secondary | ICD-10-CM | POA: Diagnosis not present

## 2022-04-23 DIAGNOSIS — M5030 Other cervical disc degeneration, unspecified cervical region: Secondary | ICD-10-CM | POA: Diagnosis not present

## 2022-04-23 DIAGNOSIS — M549 Dorsalgia, unspecified: Secondary | ICD-10-CM | POA: Diagnosis not present

## 2022-04-25 ENCOUNTER — Other Ambulatory Visit: Payer: Self-pay | Admitting: Family Medicine

## 2022-04-25 DIAGNOSIS — J410 Simple chronic bronchitis: Secondary | ICD-10-CM

## 2022-04-25 DIAGNOSIS — E1169 Type 2 diabetes mellitus with other specified complication: Secondary | ICD-10-CM

## 2022-04-25 DIAGNOSIS — K219 Gastro-esophageal reflux disease without esophagitis: Secondary | ICD-10-CM

## 2022-04-27 DIAGNOSIS — M5137 Other intervertebral disc degeneration, lumbosacral region: Secondary | ICD-10-CM | POA: Diagnosis not present

## 2022-04-27 DIAGNOSIS — D72829 Elevated white blood cell count, unspecified: Secondary | ICD-10-CM | POA: Diagnosis not present

## 2022-04-27 DIAGNOSIS — R030 Elevated blood-pressure reading, without diagnosis of hypertension: Secondary | ICD-10-CM | POA: Diagnosis not present

## 2022-04-27 DIAGNOSIS — M5030 Other cervical disc degeneration, unspecified cervical region: Secondary | ICD-10-CM | POA: Diagnosis not present

## 2022-04-27 DIAGNOSIS — F1190 Opioid use, unspecified, uncomplicated: Secondary | ICD-10-CM | POA: Diagnosis not present

## 2022-04-27 DIAGNOSIS — M2550 Pain in unspecified joint: Secondary | ICD-10-CM | POA: Diagnosis not present

## 2022-04-27 DIAGNOSIS — M5431 Sciatica, right side: Secondary | ICD-10-CM | POA: Diagnosis not present

## 2022-04-27 DIAGNOSIS — E119 Type 2 diabetes mellitus without complications: Secondary | ICD-10-CM | POA: Diagnosis not present

## 2022-04-27 DIAGNOSIS — Z79899 Other long term (current) drug therapy: Secondary | ICD-10-CM | POA: Diagnosis not present

## 2022-04-27 DIAGNOSIS — M5430 Sciatica, unspecified side: Secondary | ICD-10-CM | POA: Diagnosis not present

## 2022-05-02 ENCOUNTER — Ambulatory Visit (INDEPENDENT_AMBULATORY_CARE_PROVIDER_SITE_OTHER): Payer: Medicare HMO

## 2022-05-02 VITALS — Wt 117.0 lb

## 2022-05-02 DIAGNOSIS — D72829 Elevated white blood cell count, unspecified: Secondary | ICD-10-CM | POA: Diagnosis not present

## 2022-05-02 DIAGNOSIS — D75839 Thrombocytosis, unspecified: Secondary | ICD-10-CM | POA: Diagnosis not present

## 2022-05-02 DIAGNOSIS — Z0000 Encounter for general adult medical examination without abnormal findings: Secondary | ICD-10-CM

## 2022-05-02 DIAGNOSIS — M5137 Other intervertebral disc degeneration, lumbosacral region: Secondary | ICD-10-CM | POA: Diagnosis not present

## 2022-05-02 DIAGNOSIS — E119 Type 2 diabetes mellitus without complications: Secondary | ICD-10-CM | POA: Diagnosis not present

## 2022-05-02 DIAGNOSIS — M5431 Sciatica, right side: Secondary | ICD-10-CM | POA: Diagnosis not present

## 2022-05-02 DIAGNOSIS — Z0130 Encounter for examination of blood pressure without abnormal findings: Secondary | ICD-10-CM | POA: Diagnosis not present

## 2022-05-02 DIAGNOSIS — M5030 Other cervical disc degeneration, unspecified cervical region: Secondary | ICD-10-CM | POA: Diagnosis not present

## 2022-05-02 DIAGNOSIS — F1190 Opioid use, unspecified, uncomplicated: Secondary | ICD-10-CM | POA: Diagnosis not present

## 2022-05-02 DIAGNOSIS — M5430 Sciatica, unspecified side: Secondary | ICD-10-CM | POA: Diagnosis not present

## 2022-05-02 DIAGNOSIS — Z79899 Other long term (current) drug therapy: Secondary | ICD-10-CM | POA: Diagnosis not present

## 2022-05-02 NOTE — Progress Notes (Signed)
Virtual Visit via Telephone Note  I connected with  Lori Schroeder on 05/02/22 at 11:00 AM EDT by telephone and verified that I am speaking with the correct person using two identifiers.  Location: Patient: home Provider: [facility] Persons participating in the virtual visit: patient/Nurse Health Advisor   I discussed the limitations, risks, security and privacy concerns of performing an evaluation and management service by telephone and the availability of in person appointments. The patient expressed understanding and agreed to proceed.  Interactive audio and video telecommunications were attempted between this nurse and patient, however failed, due to patient having technical difficulties OR patient did not have access to video capability.  We continued and completed visit with audio only.  Some vital signs may be absent or patient reported.   Dionisio David, LPN  Subjective:   Lori Schroeder is a 66 y.o. female who presents for Medicare Annual (Subsequent) preventive examination.  Review of Systems     Cardiac Risk Factors include: diabetes mellitus;dyslipidemia;hypertension     Objective:    There were no vitals filed for this visit. There is no height or weight on file to calculate BMI.     05/02/2022   11:08 AM 04/29/2021    1:31 PM 04/28/2020    1:23 PM 03/19/2019    3:11 PM 01/18/2016    2:39 PM  Advanced Directives  Does Patient Have a Medical Advance Directive? No No No No No  Would patient like information on creating a medical advance directive? No - Patient declined No - Patient declined No - Patient declined Yes (MAU/Ambulatory/Procedural Areas - Information given)     Current Medications (verified) Outpatient Encounter Medications as of 05/02/2022  Medication Sig   albuterol (VENTOLIN HFA) 108 (90 Base) MCG/ACT inhaler INHALE 2 PUFFS INTO LUNGS EVERY 6 HOURS AS NEEDED FOR COUGH & WHEEZING   atorvastatin (LIPITOR) 10 MG tablet TAKE 1 TABLET EVERY DAY    Budeson-Glycopyrrol-Formoterol (BREZTRI AEROSPHERE) 160-9-4.8 MCG/ACT AERO Inhale 2 puffs into the lungs in the morning and at bedtime.   Dulaglutide (TRULICITY) 1.5 VQ/9.4HW SOPN Inject 1.5 mg into the skin once a week. INJECT 1.'5MG'$  ONCE A WEEK   ergocalciferol (VITAMIN D2) 1.25 MG (50000 UT) capsule ergocalciferol (vitamin D2) 1,250 mcg (50,000 unit) capsule  TAKE ONE CAPSULE EVERY 7 DAYS FOR 12 DOSES, THEN START OTC 400 IU DAILY   esomeprazole (NEXIUM) 40 MG capsule TAKE (1) CAPSULE TWICE DAILY.   fluticasone (FLONASE) 50 MCG/ACT nasal spray Place 2 sprays into both nostrils daily.   HYDROcodone-acetaminophen (NORCO/VICODIN) 5-325 MG tablet SMARTSIG:0.5-1 Tablet(s) By Mouth 1-2 Times Daily PRN   naloxone (NARCAN) nasal spray 4 mg/0.1 mL See admin instructions.   oxyCODONE ER (XTAMPZA ER) 13.5 MG C12A    oxyCODONE ER (XTAMPZA ER) 9 MG C12A    pregabalin (LYRICA) 100 MG capsule Take by mouth.   pregabalin (LYRICA) 150 MG capsule    pregabalin (LYRICA) 300 MG capsule Take 300 mg by mouth at bedtime.   pregabalin (LYRICA) 50 MG capsule    tiZANidine (ZANAFLEX) 4 MG tablet TAKE ONE TABLET BY MOUTH THREE TIMES DAILY AS NEEDED FOR MUSCLE SPASM   BELBUCA 450 MCG FILM SMARTSIG:1 Strip(s) By Mouth Every 12 Hours (Patient not taking: Reported on 05/02/2022)   benzonatate (TESSALON) 200 MG capsule  (Patient not taking: Reported on 05/02/2022)   ciprofloxacin-dexamethasone (CIPRODEX) OTIC suspension  (Patient not taking: Reported on 05/02/2022)   clindamycin (CLEOCIN-T) 1 % external solution Apply topically 2 (two) times daily. Until clear (  Patient not taking: Reported on 05/02/2022)   lisinopril (ZESTRIL) 5 MG tablet  (Patient not taking: Reported on 05/02/2022)   predniSONE (DELTASONE) 10 MG tablet  (Patient not taking: Reported on 05/02/2022)   predniSONE (DELTASONE) 20 MG tablet  (Patient not taking: Reported on 05/02/2022)   tapentadol (NUCYNTA ER) 50 MG 12 hr tablet  (Patient not taking: Reported on 05/02/2022)    tapentadol HCl (NUCYNTA) 75 MG tablet  (Patient not taking: Reported on 05/02/2022)   traMADol (ULTRAM) 50 MG tablet  (Patient not taking: Reported on 05/02/2022)   [DISCONTINUED] atorvastatin (LIPITOR) 10 MG tablet Take 1 tablet by mouth daily.   [DISCONTINUED] pregabalin (LYRICA) 100 MG capsule Take 100 mg by mouth 2 (two) times daily.   No facility-administered encounter medications on file as of 05/02/2022.    Allergies (verified) Hydroxychloroquine and Sulfa antibiotics   History: Past Medical History:  Diagnosis Date   Blood transfusion without reported diagnosis    External hemorrhoids without mention of complication [id number]   Colonoscopy   Family history of malignant neoplasm of gastrointestinal tract    Family history of malignant neoplasm of gastrointestinal tract    GERD (gastroesophageal reflux disease)    Hyperlipemia    Hypertension    Past Surgical History:  Procedure Laterality Date   ABDOMINAL HYSTERECTOMY     BACK SURGERY     BLADDER SURGERY     Bladder Tact   CARPAL TUNNEL RELEASE Bilateral    COLONOSCOPY     FINGER SURGERY Right    Small finger fracture surgery   NECK SURGERY     Family History  Problem Relation Age of Onset   Colon cancer Maternal Grandmother    Arthritis Maternal Grandmother    Alzheimer's disease Mother    Hyperlipidemia Mother    Hypertension Mother    Cancer Father    Alcohol abuse Father    Diabetes Daughter        Type 1   Diabetes Son    Breast cancer Neg Hx    Celiac disease Neg Hx    Cirrhosis Neg Hx    Clotting disorder Neg Hx    Colitis Neg Hx    Colon polyps Neg Hx    Crohn's disease Neg Hx    Cystic fibrosis Neg Hx    Esophageal cancer Neg Hx    Heart disease Neg Hx    Hemochromatosis Neg Hx    Inflammatory bowel disease Neg Hx    Irritable bowel syndrome Neg Hx    Kidney disease Neg Hx    Liver cancer Neg Hx    Liver disease Neg Hx    Ovarian cancer Neg Hx    Pancreatic cancer Neg Hx    Prostate cancer  Neg Hx    Rectal cancer Neg Hx    Stomach cancer Neg Hx    Ulcerative colitis Neg Hx    Uterine cancer Neg Hx    Wilson's disease Neg Hx    Social History   Socioeconomic History   Marital status: Married    Spouse name: Christia Reading   Number of children: 2   Years of education: 14   Highest education level: Associate degree: occupational, Hotel manager, or vocational program  Occupational History   Occupation: disability    Employer: DISABLED  Tobacco Use   Smoking status: Every Day    Packs/day: 0.50    Years: 10.00    Total pack years: 5.00    Types: Cigarettes   Smokeless tobacco: Never  Vaping Use   Vaping Use: Never used  Substance and Sexual Activity   Alcohol use: No   Drug use: No   Sexual activity: Not on file  Other Topics Concern   Not on file  Social History Narrative   Not on file   Social Determinants of Health   Financial Resource Strain: Low Risk  (05/02/2022)   Overall Financial Resource Strain (CARDIA)    Difficulty of Paying Living Expenses: Not hard at all  Food Insecurity: No Food Insecurity (05/02/2022)   Hunger Vital Sign    Worried About Running Out of Food in the Last Year: Never true    Ran Out of Food in the Last Year: Never true  Transportation Needs: No Transportation Needs (05/02/2022)   PRAPARE - Hydrologist (Medical): No    Lack of Transportation (Non-Medical): No  Physical Activity: Insufficiently Active (05/02/2022)   Exercise Vital Sign    Days of Exercise per Week: 3 days    Minutes of Exercise per Session: 30 min  Stress: No Stress Concern Present (05/02/2022)   [place]    Feeling of Stress : Not at all  Social Connections: Moderately Integrated (05/02/2022)   Social Connection and Isolation Panel [NHANES]    Frequency of Communication with Friends and Family: More than three times a week    Frequency of Social Gatherings with Friends and  Family: Once a week    Attends Religious Services: More than 4 times per year    Active Member of [employer] or Organizations: No    Attends Music therapist: Never    Marital Status: Married    Tobacco Counseling Ready to quit: Not Answered Counseling given: Not Answered   Clinical Intake:  Pre-visit preparation completed: Yes  Pain : No/denies pain     Nutritional Risks: None Diabetes: Yes CBG done?: No Did pt. bring in CBG monitor from home?: No  How often do you need to have someone help you when you read instructions, pamphlets, or other written materials from your doctor or pharmacy?: 1 - Never  Diabetic?yes Nutrition Risk Assessment:  Has the patient had any N/V/D within the last 2 months?  No  Does the patient have any non-healing wounds?  No  Has the patient had any unintentional weight loss or weight gain?  No   Diabetes:  Is the patient diabetic?  Yes  If diabetic, was a CBG obtained today?  No  Did the patient bring in their glucometer from home?  No  How often do you monitor your CBG's? Twice/week   Financial Strains and Diabetes Management:  Are you having any financial strains with the device, your supplies or your medication? No .  Does the patient want to be seen by Chronic Care Management for management of their diabetes?  No  Would the patient like to be referred to a Nutritionist or for Diabetic Management?  No   Diabetic Exams:  Diabetic Eye Exam: Completed 05/18/21. Pt has been advised about the importance in completing this exam.  Diabetic Foot Exam: Completed 11/30/20. Pt has been advised about the importance in completing this exam.   Interpreter Needed?: No  Information entered by :: Kirke Shaggy, LPN   Activities of Daily Living    05/02/2022   11:09 AM  In your present state of health, do you have any difficulty performing the following activities:  Hearing? 0  Vision? 0  Difficulty concentrating or making decisions? 0   Walking or climbing stairs? 0  Dressing or bathing? 0  Doing errands, shopping? 0  Preparing Food and eating ? N  Using the Toilet? N  In the past six months, have you accidently leaked urine? N  Do you have problems with loss of bowel control? N  Managing your Medications? N  Managing your Finances? N  Housekeeping or managing your Housekeeping? N    Patient Care Team: [name], DO as PCP - General (Family Medicine) Iran Planas, MD as Consulting Physician (Orthopedic Surgery) Adline Potter, NP as Nurse Practitioner (Anesthesiology) Ob/Gyn, Tammy Sours, Allison Quarry, MD as Referring Physician (Optometry) [facility] as Pharmacist (Family Medicine)  Indicate any recent Medical Services you may have received from other than [facility] providers in the past year (date may be approximate).     Assessment:   This is a routine wellness examination for [facility].  Hearing/Vision screen Hearing Screening - Comments:: No aids Vision Screening - Comments:: Wears glasses- [facility] in [place] issues and exercise activities discussed: Current Exercise Habits: Home exercise routine, Type of exercise: walking, Time (Minutes): 30, Frequency (Times/Week): 3, Weekly Exercise (Minutes/Week): 90, Intensity: Mild   Goals Addressed             This Visit's Progress    DIET - EAT MORE FRUITS AND VEGETABLES         Depression Screen    05/02/2022   11:06 AM 02/22/2022   11:33 AM 01/27/2022    8:43 AM 11/28/2021    8:11 AM 08/01/2021    8:06 AM 04/29/2021    1:30 PM 04/05/2021    9:15 AM  PHQ 2/9 Scores  PHQ - 2 Score 0 0 0 0 0 0 0  PHQ- 9 Score 0 0 0        Fall Risk    05/02/2022   11:08 AM 02/22/2022   11:33 AM 01/27/2022    8:43 AM 11/28/2021    8:11 AM 08/01/2021    8:06 AM  Fall Risk   Falls in the past year? 0 0 0 0 1  Number falls in past yr: 0    0  Injury with Fall? 0    0  Risk for fall due to : No Fall Risks     History of fall(s)  Follow up Falls evaluation completed    Education provided    FALL RISK PREVENTION PERTAINING TO THE HOME:  Any stairs in or around the home? Yes  If so, are there any without handrails? No  Home free of loose throw rugs in walkways, pet beds, electrical cords, etc? Yes  Adequate lighting in your home to reduce risk of falls? Yes   ASSISTIVE DEVICES UTILIZED TO PREVENT FALLS:  Life alert? No  Use of a cane, walker or w/c? No  Grab bars in the bathroom? Yes  Shower chair or bench in shower? No  Elevated toilet seat or a handicapped toilet? No     Cognitive Function:    08/01/2021    8:17 AM  MMSE - Mini Mental State Exam  Orientation to time 5  Orientation to Place 5  Registration 3  Attention/ Calculation 5  Recall 3  Language- name 2 objects 2  Language- repeat 1  Language- follow 3 step command 3  Language- read & follow direction 1  Write a sentence 1  Copy design 1  Total  score 30        05/02/2022   11:10 AM 04/28/2020    1:28 PM 03/19/2019    3:13 PM  6CIT Screen  What Year? 0 points 0 points 0 points  What month? 0 points 0 points 0 points  What time? 0 points 0 points 0 points  Count back from 20 0 points 0 points 0 points  Months in reverse 0 points 0 points 0 points  Repeat phrase 0 points 0 points 0 points  Total Score 0 points 0 points 0 points    Immunizations Immunization History  Administered Date(s) Administered   Influenza-Unspecified 10/02/2018   Pneumococcal Conjugate-13 05/29/2018   Tdap 05/29/2018    TDAP status: Up to date  Flu Vaccine status: Declined, Education has been provided regarding the importance of this vaccine but patient still declined. Advised may receive this vaccine at local pharmacy or Health Dept. Aware to provide a copy of the vaccination record if obtained from local pharmacy or Health Dept. Verbalized acceptance and understanding.  Pneumococcal vaccine status: Declined,  Education has been  provided regarding the importance of this vaccine but patient still declined. Advised may receive this vaccine at local pharmacy or Health Dept. Aware to provide a copy of the vaccination record if obtained from local pharmacy or Health Dept. Verbalized acceptance and understanding.   Covid-19 vaccine status: Declined, Education has been provided regarding the importance of this vaccine but patient still declined. Advised may receive this vaccine at local pharmacy or Health Dept.or vaccine clinic. Aware to provide a copy of the vaccination record if obtained from local pharmacy or Health Dept. Verbalized acceptance and understanding.  Qualifies for Shingles Vaccine? Yes   Zostavax completed No   Shingrix Completed?: No.    Education has been provided regarding the importance of this vaccine. Patient has been advised to call insurance company to determine out of pocket expense if they have not yet received this vaccine. Advised may also receive vaccine at local pharmacy or Health Dept. Verbalized acceptance and understanding.  Screening Tests Health Maintenance  Topic Date Due   COVID-19 Vaccine (1) Never done   PAP SMEAR-Modifier  07/06/2016   FOOT EXAM  11/30/2021   URINE MICROALBUMIN  03/29/2022   INFLUENZA VACCINE  05/02/2022   Zoster Vaccines- Shingrix (1 of 2) 05/25/2022 (Originally 05/16/1975)   OPHTHALMOLOGY EXAM  05/18/2022   HEMOGLOBIN A1C  05/28/2022   COLONOSCOPY (Pts 45-59yr Insurance coverage will need to be confirmed)  11/06/2022   DEXA SCAN  01/28/2023   MAMMOGRAM  06/14/2023   TETANUS/TDAP  05/29/2028   Hepatitis C Screening  Completed   HIV Screening  Completed   HPV VACCINES  Aged Out    Health Maintenance  Health Maintenance Due  Topic Date Due   COVID-19 Vaccine (1) Never done   PAP SMEAR-Modifier  07/06/2016   FOOT EXAM  11/30/2021   URINE MICROALBUMIN  03/29/2022   INFLUENZA VACCINE  05/02/2022    Colorectal cancer screening: Type of screening:  Colonoscopy. Completed 11/06/12. Repeat every 10 years  Mammogram status: Completed 06/13/21. Repeat every year- has letter to make appointment  Bone Density status: Completed 01/27/21. Results reflect: Bone density results: OSTEOPOROSIS. Repeat every 2 years.  Lung Cancer Screening: (Low Dose CT Chest recommended if Age 717-80years, 30 pack-year currently smoking OR have quit w/in 15years.) does qualify.   Lung Cancer Screening Referral: declined  Additional Screening:  Hepatitis C Screening: does qualify; Completed 02/02/21  Vision Screening: Recommended annual  ophthalmology exams for early detection of glaucoma and other disorders of the eye. Is the patient up to date with their annual eye exam?  Yes  Who is the provider or what is the name of the office in which the patient attends annual eye exams? [facility] in [place] If pt is not established with a provider, would they like to be referred to a provider to establish care? No .   Dental Screening: Recommended annual dental exams for proper oral hygiene  Community Resource Referral / Chronic Care Management: CRR required this visit?  No   CCM required this visit?  No      Plan:     I have personally reviewed and noted the following in the patient's chart:   Medical and social history Use of alcohol, tobacco or illicit drugs  Current medications and supplements including opioid prescriptions.  Functional ability and status Nutritional status Physical activity Advanced directives List of other physicians Hospitalizations, surgeries, and ER visits in previous 12 months Vitals Screenings to include cognitive, depression, and falls Referrals and appointments  In addition, I have reviewed and discussed with patient certain preventive protocols, quality metrics, and best practice recommendations. A written personalized care plan for preventive services as well as general preventive health recommendations were provided to patient.      Dionisio David, LPN   03/08/8937   Nurse Notes: none

## 2022-05-02 NOTE — Patient Instructions (Signed)
Lori Schroeder , Thank you for taking time to come for your Medicare Wellness Visit. I appreciate your ongoing commitment to your health goals. Please review the following plan we discussed and let me know if I can assist you in the future.   Screening recommendations/referrals: Colonoscopy: 11/06/12 Mammogram: 06/13/21 Bone Density: 01/27/21 Recommended yearly ophthalmology/optometry visit for glaucoma screening and checkup Recommended yearly dental visit for hygiene and checkup  Vaccinations: Influenza vaccine: n/d Pneumococcal vaccine: 05/29/18, needs 2nd shot Tdap vaccine: 05/29/18 Shingles vaccine: n/d  Covid-19: n/d  Advanced directives: no  Conditions/risks identified: none  Next appointment: Follow up in one year for your annual wellness visit. 05/04/23 @ 10:30 am by phone  Preventive Care 40-64 Years, Female Preventive care refers to lifestyle choices and visits with your health care provider that can promote health and wellness. What does preventive care include? A yearly physical exam. This is also called an annual well check. Dental exams once or twice a year. Routine eye exams. Ask your health care provider how often you should have your eyes checked. Personal lifestyle choices, including: Daily care of your teeth and gums. Regular physical activity. Eating a healthy diet. Avoiding tobacco and drug use. Limiting alcohol use. Practicing safe sex. Taking low-dose aspirin daily starting at age 50. Taking vitamin and mineral supplements as recommended by your health care provider. What happens during an annual well check? The services and screenings done by your health care provider during your annual well check will depend on your age, overall health, lifestyle risk factors, and family history of disease. Counseling  Your health care provider may ask you questions about your: Alcohol use. Tobacco use. Drug use. Emotional well-being. Home and relationship well-being. Sexual  activity. Eating habits. Work and work Statistician. Method of birth control. Menstrual cycle. Pregnancy history. Screening  You may have the following tests or measurements: Height, weight, and BMI. Blood pressure. Lipid and cholesterol levels. These may be checked every 5 years, or more frequently if you are over 14 years old. Skin check. Lung cancer screening. You may have this screening every year starting at age 34 if you have a 30-pack-year history of smoking and currently smoke or have quit within the past 15 years. Fecal occult blood test (FOBT) of the stool. You may have this test every year starting at age 88. Flexible sigmoidoscopy or colonoscopy. You may have a sigmoidoscopy every 5 years or a colonoscopy every 10 years starting at age 22. Hepatitis C blood test. Hepatitis B blood test. Sexually transmitted disease (STD) testing. Diabetes screening. This is done by checking your blood sugar (glucose) after you have not eaten for a while (fasting). You may have this done every 1-3 years. Mammogram. This may be done every 1-2 years. Talk to your health care provider about when you should start having regular mammograms. This may depend on whether you have a family history of breast cancer. BRCA-related cancer screening. This may be done if you have a family history of breast, ovarian, tubal, or peritoneal cancers. Pelvic exam and Pap test. This may be done every 3 years starting at age 19. Starting at age 2, this may be done every 5 years if you have a Pap test in combination with an HPV test. Bone density scan. This is done to screen for osteoporosis. You may have this scan if you are at high risk for osteoporosis. Discuss your test results, treatment options, and if necessary, the need for more tests with your health care provider.  Vaccines  Your health care provider may recommend certain vaccines, such as: Influenza vaccine. This is recommended every year. Tetanus, diphtheria,  and acellular pertussis (Tdap, Td) vaccine. You may need a Td booster every 10 years. Zoster vaccine. You may need this after age 48. Pneumococcal 13-valent conjugate (PCV13) vaccine. You may need this if you have certain conditions and were not previously vaccinated. Pneumococcal polysaccharide (PPSV23) vaccine. You may need one or two doses if you smoke cigarettes or if you have certain conditions. Talk to your health care provider about which screenings and vaccines you need and how often you need them. This information is not intended to replace advice given to you by your health care provider. Make sure you discuss any questions you have with your health care provider. Document Released: 10/15/2015 Document Revised: 06/07/2016 Document Reviewed: 07/20/2015 Elsevier Interactive Patient Education  2017 [place] Prevention in the Home Falls can cause injuries. They can happen to people of all ages. There are many things you can do to make your home safe and to help prevent falls. What can I do on the outside of my home? Regularly fix the edges of walkways and driveways and fix any cracks. Remove anything that might make you trip as you walk through a door, such as a raised step or threshold. Trim any bushes or trees on the path to your home. Use bright outdoor lighting. Clear any walking paths of anything that might make someone trip, such as rocks or tools. Regularly check to see if handrails are loose or broken. Make sure that both sides of any steps have handrails. Any raised decks and porches should have guardrails on the edges. Have any leaves, snow, or ice cleared regularly. Use sand or salt on walking paths during winter. Clean up any spills in your garage right away. This includes oil or grease spills. What can I do in the bathroom? Use night lights. Install grab bars by the toilet and in the tub and shower. Do not use towel bars as grab bars. Use non-skid mats or  decals in the tub or shower. If you need to sit down in the shower, use a plastic, non-slip stool. Keep the floor dry. Clean up any water that spills on the floor as soon as it happens. Remove soap buildup in the tub or shower regularly. Attach bath mats securely with double-sided non-slip rug tape. Do not have throw rugs and other things on the floor that can make you trip. What can I do in the bedroom? Use night lights. Make sure that you have a light by your bed that is easy to reach. Do not use any sheets or blankets that are too big for your bed. They should not hang down onto the floor. Have a firm chair that has side arms. You can use this for support while you get dressed. Do not have throw rugs and other things on the floor that can make you trip. What can I do in the kitchen? Clean up any spills right away. Avoid walking on wet floors. Keep items that you use a lot in easy-to-reach places. If you need to reach something above you, use a strong step stool that has a grab bar. Keep electrical cords out of the way. Do not use floor polish or wax that makes floors slippery. If you must use wax, use non-skid floor wax. Do not have throw rugs and other things on the floor that can make you  trip. What can I do with my stairs? Do not leave any items on the stairs. Make sure that there are handrails on both sides of the stairs and use them. Fix handrails that are broken or loose. Make sure that handrails are as long as the stairways. Check any carpeting to make sure that it is firmly attached to the stairs. Fix any carpet that is loose or worn. Avoid having throw rugs at the top or bottom of the stairs. If you do have throw rugs, attach them to the floor with carpet tape. Make sure that you have a light switch at the top of the stairs and the bottom of the stairs. If you do not have them, ask someone to add them for you. What else can I do to help prevent falls? Wear shoes that: Do not  have high heels. Have rubber bottoms. Are comfortable and fit you well. Are closed at the toe. Do not wear sandals. If you use a stepladder: Make sure that it is fully opened. Do not climb a closed stepladder. Make sure that both sides of the stepladder are locked into place. Ask someone to hold it for you, if possible. Clearly mark and make sure that you can see: Any grab bars or handrails. First and last steps. Where the edge of each step is. Use tools that help you move around (mobility aids) if they are needed. These include: Canes. Walkers. Scooters. Crutches. Turn on the lights when you go into a dark area. Replace any light bulbs as soon as they burn out. Set up your furniture so you have a clear path. Avoid moving your furniture around. If any of your floors are uneven, fix them. If there are any pets around you, be aware of where they are. Review your medicines with your doctor. Some medicines can make you feel dizzy. This can increase your chance of falling. Ask your doctor what other things that you can do to help prevent falls. This information is not intended to replace advice given to you by your health care provider. Make sure you discuss any questions you have with your health care provider. Document Released: 07/15/2009 Document Revised: 02/24/2016 Document Reviewed: 10/23/2014 Elsevier Interactive Patient Education  2017 [employer].

## 2022-05-07 DIAGNOSIS — Z0130 Encounter for examination of blood pressure without abnormal findings: Secondary | ICD-10-CM | POA: Diagnosis not present

## 2022-05-07 DIAGNOSIS — M5431 Sciatica, right side: Secondary | ICD-10-CM | POA: Diagnosis not present

## 2022-05-07 DIAGNOSIS — Z79899 Other long term (current) drug therapy: Secondary | ICD-10-CM | POA: Diagnosis not present

## 2022-05-07 DIAGNOSIS — F1190 Opioid use, unspecified, uncomplicated: Secondary | ICD-10-CM | POA: Diagnosis not present

## 2022-05-07 DIAGNOSIS — E119 Type 2 diabetes mellitus without complications: Secondary | ICD-10-CM | POA: Diagnosis not present

## 2022-05-07 DIAGNOSIS — M5030 Other cervical disc degeneration, unspecified cervical region: Secondary | ICD-10-CM | POA: Diagnosis not present

## 2022-05-07 DIAGNOSIS — M5137 Other intervertebral disc degeneration, lumbosacral region: Secondary | ICD-10-CM | POA: Diagnosis not present

## 2022-05-07 DIAGNOSIS — D75839 Thrombocytosis, unspecified: Secondary | ICD-10-CM | POA: Diagnosis not present

## 2022-05-07 DIAGNOSIS — D72829 Elevated white blood cell count, unspecified: Secondary | ICD-10-CM | POA: Diagnosis not present

## 2022-05-29 ENCOUNTER — Ambulatory Visit (INDEPENDENT_AMBULATORY_CARE_PROVIDER_SITE_OTHER): Payer: Medicare HMO | Admitting: Family Medicine

## 2022-05-29 ENCOUNTER — Encounter: Payer: Self-pay | Admitting: Family Medicine

## 2022-05-29 DIAGNOSIS — E119 Type 2 diabetes mellitus without complications: Secondary | ICD-10-CM

## 2022-05-29 DIAGNOSIS — E1169 Type 2 diabetes mellitus with other specified complication: Secondary | ICD-10-CM

## 2022-05-29 DIAGNOSIS — K219 Gastro-esophageal reflux disease without esophagitis: Secondary | ICD-10-CM

## 2022-05-29 DIAGNOSIS — L237 Allergic contact dermatitis due to plants, except food: Secondary | ICD-10-CM | POA: Diagnosis not present

## 2022-05-29 DIAGNOSIS — E785 Hyperlipidemia, unspecified: Secondary | ICD-10-CM | POA: Diagnosis not present

## 2022-05-29 MED ORDER — TRULICITY 1.5 MG/0.5ML ~~LOC~~ SOAJ: 1.5000 mg | SUBCUTANEOUS | 3 refills | Status: DC

## 2022-05-29 MED ORDER — ATORVASTATIN CALCIUM 10 MG PO TABS: 10.0000 mg | ORAL_TABLET | Freq: Every day | ORAL | 3 refills | Status: DC

## 2022-05-29 MED ORDER — TRIAMCINOLONE ACETONIDE 0.1 % EX CREA: 1.0000 | TOPICAL_CREAM | Freq: Two times a day (BID) | CUTANEOUS | 0 refills | Status: DC

## 2022-05-29 MED ORDER — ESOMEPRAZOLE MAGNESIUM 40 MG PO CPDR
40.0000 mg | DELAYED_RELEASE_CAPSULE | Freq: Two times a day (BID) | ORAL | 3 refills | Status: DC
Start: 2022-05-29 — End: 2023-05-09

## 2022-05-29 NOTE — Progress Notes (Signed)
Telephone visit  Subjective: CC: Diabetes PCP: [name], DO IOE:VOJJK Lori Schroeder is a 66 y.o. female calls for telephone consult today. Patient provides verbal consent for consult held via phone.  Due to COVID-19 pandemic this visit was conducted virtually. This visit type was conducted due to national recommendations for restrictions regarding the COVID-19 Pandemic (e.g. social distancing, sheltering in place) in an effort to limit this patient's exposure and mitigate transmission in our community. All issues noted in this document were discussed and addressed.  A physical exam was not performed with this format.   Location of patient: home Location of provider: [facility] Others present for call: none  1.  Type 2 diabetes associated with hyperlipidemia She had labs done with [facility] recently.  Her A1c was 6.7 on 04/23/22.  She reports compliance with Trulicity, atorvastatin. Treated with ACE inhibitor.  She denies any hypoglycemic episodes.  No chest pain, shortness of breath  2. Poison oak Unfortunately got into some poison oak recently.  She has been using calamine lotion.  This is been somewhat helpful but she still has itching quite a bit on her extremities  ROS: Per HPI  Allergies  Allergen Reactions   Hydroxychloroquine Itching and Rash    Diffuse drug rash   Sulfa Antibiotics    Past Medical History:  Diagnosis Date   Blood transfusion without reported diagnosis    External hemorrhoids without mention of complication [id number]   Colonoscopy   Family history of malignant neoplasm of gastrointestinal tract    Family history of malignant neoplasm of gastrointestinal tract    GERD (gastroesophageal reflux disease)    Hyperlipemia    Hypertension     Current Outpatient Medications:    albuterol (VENTOLIN HFA) 108 (90 Base) MCG/ACT inhaler, INHALE 2 PUFFS INTO LUNGS EVERY 6 HOURS AS NEEDED FOR COUGH & WHEEZING, Disp: 8.5 g, Rfl: 0   atorvastatin (LIPITOR) 10 MG  tablet, TAKE 1 TABLET EVERY DAY, Disp: 90 tablet, Rfl: 0   BELBUCA 450 MCG FILM, SMARTSIG:1 Strip(s) By Mouth Every 12 Hours (Patient not taking: Reported on 05/02/2022), Disp: , Rfl:    benzonatate (TESSALON) 200 MG capsule, , Disp: , Rfl:    Budeson-Glycopyrrol-Formoterol (BREZTRI AEROSPHERE) 160-9-4.8 MCG/ACT AERO, Inhale 2 puffs into the lungs in the morning and at bedtime., Disp: 32.1 g, Rfl: 6   ciprofloxacin-dexamethasone (CIPRODEX) OTIC suspension, , Disp: , Rfl:    clindamycin (CLEOCIN-T) 1 % external solution, Apply topically 2 (two) times daily. Until clear (Patient not taking: Reported on 05/02/2022), Disp: 30 mL, Rfl: 12   Dulaglutide (TRULICITY) 1.5 XH/3.7JI SOPN, Inject 1.5 mg into the skin once a week. INJECT 1.'5MG'$  ONCE A WEEK, Disp: 6 mL, Rfl: 1   ergocalciferol (VITAMIN D2) 1.25 MG (50000 UT) capsule, ergocalciferol (vitamin D2) 1,250 mcg (50,000 unit) capsule  TAKE ONE CAPSULE EVERY 7 DAYS FOR 12 DOSES, THEN START OTC 400 IU DAILY, Disp: , Rfl:    esomeprazole (NEXIUM) 40 MG capsule, TAKE (1) CAPSULE TWICE DAILY., Disp: 180 capsule, Rfl: 0   fluticasone (FLONASE) 50 MCG/ACT nasal spray, Place 2 sprays into both nostrils daily., Disp: 16 g, Rfl: 6   HYDROcodone-acetaminophen (NORCO/VICODIN) 5-325 MG tablet, SMARTSIG:0.5-1 Tablet(s) By Mouth 1-2 Times Daily PRN, Disp: , Rfl:    lisinopril (ZESTRIL) 5 MG tablet, , Disp: , Rfl:    naloxone (NARCAN) nasal spray 4 mg/0.1 mL, See admin instructions., Disp: , Rfl:    oxyCODONE ER (XTAMPZA ER) 13.5 MG C12A, , Disp: , Rfl:  oxyCODONE ER (XTAMPZA ER) 9 MG C12A, , Disp: , Rfl:    predniSONE (DELTASONE) 10 MG tablet, , Disp: , Rfl:    predniSONE (DELTASONE) 20 MG tablet, , Disp: , Rfl:    pregabalin (LYRICA) 100 MG capsule, Take by mouth., Disp: , Rfl:    pregabalin (LYRICA) 150 MG capsule, , Disp: , Rfl:    pregabalin (LYRICA) 300 MG capsule, Take 300 mg by mouth at bedtime., Disp: , Rfl:    pregabalin (LYRICA) 50 MG capsule, , Disp: , Rfl:     tapentadol (NUCYNTA ER) 50 MG 12 hr tablet, , Disp: , Rfl:    tapentadol HCl (NUCYNTA) 75 MG tablet, , Disp: , Rfl:    tiZANidine (ZANAFLEX) 4 MG tablet, TAKE ONE TABLET BY MOUTH THREE TIMES DAILY AS NEEDED FOR MUSCLE SPASM, Disp: , Rfl:    traMADol (ULTRAM) 50 MG tablet, , Disp: , Rfl:   Assessment/ Plan: 66 y.o. female   Controlled type 2 diabetes mellitus without complication, without long-term current use of insulin ([place]) - Plan: Dulaglutide (TRULICITY) 1.5 HE/0.3TC SOPN  Gastroesophageal reflux disease without esophagitis - Plan: esomeprazole (NEXIUM) 40 MG capsule  Hyperlipidemia associated with type 2 diabetes mellitus ([place]) - Plan: atorvastatin (LIPITOR) 10 MG tablet  Poison oak dermatitis - Plan: triamcinolone cream (KENALOG) 0.1 %  She remains under excellent control.  Fortunately we do not have any samples of Trulicity to provide to the patient today and she is in the donut hole.  I will CC her chart to our clinical pharmacist to see if perhaps she might give her to reach out to the drug rep for samples.  GERD is stable.  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  Triamcinolone cream provided for as needed use  Start time: 8:27am End time: 8:37a  Total time spent on patient care (including telephone call/ virtual visit): 10 minutes  [place], [place] [phone]

## 2022-06-02 ENCOUNTER — Ambulatory Visit: Payer: Medicare HMO | Admitting: Family

## 2022-06-02 DIAGNOSIS — E119 Type 2 diabetes mellitus without complications: Secondary | ICD-10-CM | POA: Diagnosis not present

## 2022-06-02 DIAGNOSIS — M5030 Other cervical disc degeneration, unspecified cervical region: Secondary | ICD-10-CM | POA: Diagnosis not present

## 2022-06-02 DIAGNOSIS — M5431 Sciatica, right side: Secondary | ICD-10-CM | POA: Diagnosis not present

## 2022-06-02 DIAGNOSIS — Z79899 Other long term (current) drug therapy: Secondary | ICD-10-CM | POA: Diagnosis not present

## 2022-06-02 DIAGNOSIS — D75839 Thrombocytosis, unspecified: Secondary | ICD-10-CM | POA: Diagnosis not present

## 2022-06-02 DIAGNOSIS — M5137 Other intervertebral disc degeneration, lumbosacral region: Secondary | ICD-10-CM | POA: Diagnosis not present

## 2022-06-02 DIAGNOSIS — Z0130 Encounter for examination of blood pressure without abnormal findings: Secondary | ICD-10-CM | POA: Diagnosis not present

## 2022-06-02 DIAGNOSIS — D72829 Elevated white blood cell count, unspecified: Secondary | ICD-10-CM | POA: Diagnosis not present

## 2022-06-02 DIAGNOSIS — F1190 Opioid use, unspecified, uncomplicated: Secondary | ICD-10-CM | POA: Diagnosis not present

## 2022-06-06 DIAGNOSIS — Z79899 Other long term (current) drug therapy: Secondary | ICD-10-CM | POA: Diagnosis not present

## 2022-06-08 ENCOUNTER — Telehealth: Payer: Self-pay | Admitting: Family Medicine

## 2022-06-08 NOTE — Telephone Encounter (Signed)
Patient calling to see if we have any samples of trulicity that she could come pick up. Please call back and let her know

## 2022-06-09 NOTE — Telephone Encounter (Signed)
Patient aware we are out of Trulicity.

## 2022-06-23 ENCOUNTER — Encounter: Payer: Self-pay | Admitting: Nurse Practitioner

## 2022-06-23 ENCOUNTER — Telehealth (INDEPENDENT_AMBULATORY_CARE_PROVIDER_SITE_OTHER): Payer: Medicare HMO | Admitting: Nurse Practitioner

## 2022-06-23 DIAGNOSIS — J069 Acute upper respiratory infection, unspecified: Secondary | ICD-10-CM | POA: Diagnosis not present

## 2022-06-23 DIAGNOSIS — H9202 Otalgia, left ear: Secondary | ICD-10-CM | POA: Diagnosis not present

## 2022-06-23 MED ORDER — FLUTICASONE PROPIONATE 50 MCG/ACT NA SUSP: 2.0000 | Freq: Every day | NASAL | 6 refills | Status: DC

## 2022-06-23 MED ORDER — PREDNISONE 20 MG PO TABS: 20.0000 mg | ORAL_TABLET | Freq: Every day | ORAL | 0 refills | Status: DC

## 2022-06-23 MED ORDER — AMOXICILLIN-POT CLAVULANATE 875-125 MG PO TABS: 1.0000 | ORAL_TABLET | Freq: Two times a day (BID) | ORAL | 0 refills | Status: DC

## 2022-06-23 MED ORDER — GUAIFENESIN ER 600 MG PO TB12: 600.0000 mg | ORAL_TABLET | Freq: Two times a day (BID) | ORAL | 0 refills | Status: DC

## 2022-06-23 NOTE — Patient Instructions (Signed)

## 2022-06-23 NOTE — Progress Notes (Signed)
   Virtual Visit  Note Due to COVID-19 pandemic this visit was conducted virtually. This visit type was conducted due to national recommendations for restrictions regarding the COVID-19 Pandemic (e.g. social distancing, sheltering in place) in an effort to limit this patient's exposure and mitigate transmission in our community. All issues noted in this document were discussed and addressed.  A physical exam was not performed with this format.  I connected with Lori Schroeder on 06/23/22 at 11 AM by telephone and verified that I am speaking with the correct person using two identifiers. Lori Schroeder is currently located at home during visit. The provider, Ivy Lynn, NP is located in their office at time of visit.  I discussed the limitations, risks, security and privacy concerns of performing an evaluation and management service by telephone and the availability of in person appointments. I also discussed with the patient that there may be a patient responsible charge related to this service. The patient expressed understanding and agreed to proceed.   History and Present Illness:  Otalgia  There is pain in the left ear. This is a new problem. The current episode started yesterday. The problem has been unchanged. There has been no fever. The pain is at a severity of 5/10. The pain is moderate. Associated symptoms include coughing and headaches. Pertinent negatives include no rash. She has tried nothing for the symptoms.      Review of Systems  Constitutional: Negative.  Negative for chills, fever and malaise/fatigue.  HENT:  Positive for ear pain.   Respiratory:  Positive for cough.   Cardiovascular: Negative.   Genitourinary: Negative.   Skin: Negative.  Negative for itching and rash.  Neurological:  Positive for headaches.  All other systems reviewed and are negative.    Observations/Objective: Televisit patient not in distress  Assessment and Plan: Patient presents with upper  respiratory symptoms with left-sided ear pain. Take meds as prescribed - Use a cool mist humidifier  -Use saline nose sprays frequently -Force fluids -For fever or aches or pains- take Tylenol or ibuprofen. -If symptoms do not improve, she may need to be COVID tested to rule this out   Follow Up Instructions: Follow-up with unresolved symptoms    I discussed the assessment and treatment plan with the patient. The patient was provided an opportunity to ask questions and all were answered. The patient agreed with the plan and demonstrated an understanding of the instructions.   The patient was advised to call back or seek an in-person evaluation if the symptoms worsen or if the condition fails to improve as anticipated.  The above assessment and management plan was discussed with the patient. The patient verbalized understanding of and has agreed to the management plan. Patient is aware to call the clinic if symptoms persist or worsen. Patient is aware when to return to the clinic for a follow-up visit. Patient educated on when it is appropriate to go to the emergency department.   Time call ended: 11:11 AM  I provided 11 minutes of  non face-to-face time during this encounter.    Ivy Lynn, NP

## 2022-07-03 ENCOUNTER — Ambulatory Visit: Payer: Medicare HMO | Admitting: Family Medicine

## 2022-07-06 DIAGNOSIS — Z0130 Encounter for examination of blood pressure without abnormal findings: Secondary | ICD-10-CM | POA: Diagnosis not present

## 2022-07-06 DIAGNOSIS — D72829 Elevated white blood cell count, unspecified: Secondary | ICD-10-CM | POA: Diagnosis not present

## 2022-07-06 DIAGNOSIS — M5137 Other intervertebral disc degeneration, lumbosacral region: Secondary | ICD-10-CM | POA: Diagnosis not present

## 2022-07-06 DIAGNOSIS — D75839 Thrombocytosis, unspecified: Secondary | ICD-10-CM | POA: Diagnosis not present

## 2022-07-06 DIAGNOSIS — E119 Type 2 diabetes mellitus without complications: Secondary | ICD-10-CM | POA: Diagnosis not present

## 2022-07-06 DIAGNOSIS — M5030 Other cervical disc degeneration, unspecified cervical region: Secondary | ICD-10-CM | POA: Diagnosis not present

## 2022-07-06 DIAGNOSIS — Z79899 Other long term (current) drug therapy: Secondary | ICD-10-CM | POA: Diagnosis not present

## 2022-07-06 DIAGNOSIS — M5431 Sciatica, right side: Secondary | ICD-10-CM | POA: Diagnosis not present

## 2022-07-06 DIAGNOSIS — F1190 Opioid use, unspecified, uncomplicated: Secondary | ICD-10-CM | POA: Diagnosis not present

## 2022-07-06 DIAGNOSIS — Z6826 Body mass index (BMI) 26.0-26.9, adult: Secondary | ICD-10-CM | POA: Diagnosis not present

## 2022-07-17 ENCOUNTER — Ambulatory Visit (INDEPENDENT_AMBULATORY_CARE_PROVIDER_SITE_OTHER): Payer: Medicare HMO | Admitting: Family Medicine

## 2022-07-17 VITALS — BP 142/76 | HR 70 | Temp 97.8°F | Ht <= 58 in | Wt 119.0 lb

## 2022-07-17 DIAGNOSIS — J069 Acute upper respiratory infection, unspecified: Secondary | ICD-10-CM | POA: Diagnosis not present

## 2022-07-17 DIAGNOSIS — J441 Chronic obstructive pulmonary disease with (acute) exacerbation: Secondary | ICD-10-CM

## 2022-07-17 LAB — VERITOR FLU A/B WAIVED
Influenza A: NEGATIVE
Influenza B: NEGATIVE

## 2022-07-17 LAB — RSV AG, IMMUNOCHR, WAIVED: RSV Ag, Immunochr, Waived: NEGATIVE

## 2022-07-17 MED ORDER — PROMETHAZINE-DM 6.25-15 MG/5ML PO SYRP: 2.5000 mL | ORAL_SOLUTION | Freq: Four times a day (QID) | ORAL | 0 refills | Status: DC | PRN

## 2022-07-17 MED ORDER — METHYLPREDNISOLONE ACETATE 40 MG/ML IJ SUSP
40.0000 mg | Freq: Once | INTRAMUSCULAR | Status: AC
  Administered 2022-07-17: 40 mg via INTRAMUSCULAR

## 2022-07-17 MED ORDER — BENZONATATE 100 MG PO CAPS: 100.0000 mg | ORAL_CAPSULE | Freq: Three times a day (TID) | ORAL | 0 refills | Status: DC | PRN

## 2022-07-17 MED ORDER — IPRATROPIUM-ALBUTEROL 0.5-2.5 (3) MG/3ML IN SOLN
3.0000 mL | Freq: Once | RESPIRATORY_TRACT | Status: AC
  Administered 2022-07-17: 3 mL via RESPIRATORY_TRACT

## 2022-07-17 MED ORDER — IPRATROPIUM-ALBUTEROL 0.5-2.5 (3) MG/3ML IN SOLN: 3.0000 mL | Freq: Once | RESPIRATORY_TRACT | Status: DC

## 2022-07-17 MED ORDER — PREDNISONE 20 MG PO TABS: 40.0000 mg | ORAL_TABLET | Freq: Every day | ORAL | 0 refills | Status: DC

## 2022-07-17 MED ORDER — CEFDINIR 300 MG PO CAPS: 300.0000 mg | ORAL_CAPSULE | Freq: Two times a day (BID) | ORAL | 0 refills | Status: DC

## 2022-07-17 NOTE — Progress Notes (Signed)
Subjective: CC: Cough PCP: [name], DO Lori Schroeder is a 66 y.o. female presenting to clinic today for:  1.  Cough and congestion Patient reports onset of cough and congestion Friday evening.  She reports fullness in her ears, irritation of throat and wheezing.  She has been utilizing her albuterol and home therapies as directed.  She had 2 days of prednisone 40 mg leftover and she did that on Saturday and Sunday but did not find that it was especially helpful.  She also had some leftover cough syrup but not sure what kind again this was not helpful.  No fevers reported.  No known sick contacts.  Does not report any myalgia outside of her baseline   ROS: Per HPI  Allergies  Allergen Reactions   Hydroxychloroquine Itching and Rash    Diffuse drug rash   Sulfa Antibiotics    Past Medical History:  Diagnosis Date   Blood transfusion without reported diagnosis    External hemorrhoids without mention of complication [id number]   Colonoscopy   Family history of malignant neoplasm of gastrointestinal tract    Family history of malignant neoplasm of gastrointestinal tract    GERD (gastroesophageal reflux disease)    Hyperlipemia    Hypertension     Current Outpatient Medications:    albuterol (VENTOLIN HFA) 108 (90 Base) MCG/ACT inhaler, INHALE 2 PUFFS INTO LUNGS EVERY 6 HOURS AS NEEDED FOR COUGH & WHEEZING, Disp: 8.5 g, Rfl: 0   amoxicillin-clavulanate (AUGMENTIN) 875-125 MG tablet, Take 1 tablet by mouth 2 (two) times daily., Disp: 14 tablet, Rfl: 0   atorvastatin (LIPITOR) 10 MG tablet, Take 1 tablet (10 mg total) by mouth daily., Disp: 90 tablet, Rfl: 3   Budeson-Glycopyrrol-Formoterol (BREZTRI AEROSPHERE) 160-9-4.8 MCG/ACT AERO, Inhale 2 puffs into the lungs in the morning and at bedtime., Disp: 32.1 g, Rfl: 6   clindamycin (CLEOCIN-T) 1 % external solution, Apply topically 2 (two) times daily. Until clear (Patient not taking: Reported on 05/02/2022), Disp: 30 mL, Rfl:  12   Dulaglutide (TRULICITY) 1.5 LZ/7.6BH SOPN, Inject 1.5 mg into the skin once a week. INJECT 1.'5MG'$  ONCE A WEEK, Disp: 6 mL, Rfl: 3   ergocalciferol (VITAMIN D2) 1.25 MG (50000 UT) capsule, ergocalciferol (vitamin D2) 1,250 mcg (50,000 unit) capsule  TAKE ONE CAPSULE EVERY 7 DAYS FOR 12 DOSES, THEN START OTC 400 IU DAILY, Disp: , Rfl:    esomeprazole (NEXIUM) 40 MG capsule, Take 1 capsule (40 mg total) by mouth 2 (two) times daily before a meal., Disp: 180 capsule, Rfl: 3   fluticasone (FLONASE) 50 MCG/ACT nasal spray, Place 2 sprays into both nostrils daily., Disp: 16 g, Rfl: 6   guaiFENesin (MUCINEX) 600 MG 12 hr tablet, Take 1 tablet (600 mg total) by mouth 2 (two) times daily., Disp: 30 tablet, Rfl: 0   HYDROcodone-acetaminophen (NORCO/VICODIN) 5-325 MG tablet, SMARTSIG:0.5-1 Tablet(s) By Mouth 1-2 Times Daily PRN, Disp: , Rfl:    Multiple Vitamins-Minerals (HAIR SKIN AND NAILS FORMULA PO), Take by mouth., Disp: , Rfl:    naloxone (NARCAN) nasal spray 4 mg/0.1 mL, See admin instructions., Disp: , Rfl:    predniSONE (DELTASONE) 20 MG tablet, Take 1 tablet (20 mg total) by mouth daily with breakfast., Disp: 6 tablet, Rfl: 0   pregabalin (LYRICA) 100 MG capsule, Take 100 mg by mouth in the morning and at bedtime. Am and afternoon, Disp: , Rfl:    pregabalin (LYRICA) 300 MG capsule, Take 300 mg by mouth at bedtime., Disp: ,  Rfl:    tiZANidine (ZANAFLEX) 4 MG tablet, TAKE ONE TABLET BY MOUTH THREE TIMES DAILY AS NEEDED FOR MUSCLE SPASM, Disp: , Rfl:    triamcinolone cream (KENALOG) 0.1 %, Apply 1 Application topically 2 (two) times daily., Disp: 30 g, Rfl: 0 Social History   Socioeconomic History   Marital status: Married    Spouse name: Christia Reading   Number of children: 2   Years of education: 14   Highest education level: Associate degree: occupational, Hotel manager, or vocational program  Occupational History   Occupation: disability    Employer: DISABLED  Tobacco Use   Smoking status: Every  Day    Packs/day: 0.50    Years: 10.00    Total pack years: 5.00    Types: Cigarettes   Smokeless tobacco: Never  Vaping Use   Vaping Use: Never used  Substance and Sexual Activity   Alcohol use: No   Drug use: No   Sexual activity: Not on file  Other Topics Concern   Not on file  Social History Narrative   Not on file   Social Determinants of Health   Financial Resource Strain: Low Risk  (05/02/2022)   Overall Financial Resource Strain (CARDIA)    Difficulty of Paying Living Expenses: Not hard at all  Food Insecurity: No Food Insecurity (05/02/2022)   Hunger Vital Sign    Worried About Running Out of Food in the Last Year: Never true    [place] in the Last Year: Never true  Transportation Needs: No Transportation Needs (05/02/2022)   PRAPARE - Hydrologist (Medical): No    Lack of Transportation (Non-Medical): No  Physical Activity: Insufficiently Active (05/02/2022)   Exercise Vital Sign    Days of Exercise per Week: 3 days    Minutes of Exercise per Session: 30 min  Stress: No Stress Concern Present (05/02/2022)   [place]    Feeling of Stress : Not at all  Social Connections: Moderately Integrated (05/02/2022)   Social Connection and Isolation Panel [NHANES]    Frequency of Communication with Friends and Family: More than three times a week    Frequency of Social Gatherings with Friends and Family: Once a week    Attends Religious Services: More than 4 times per year    Active Member of [employer] or Organizations: No    Attends Archivist Meetings: Never    Marital Status: Married  Human resources officer Violence: Not At Risk (05/02/2022)   Humiliation, Afraid, Rape, and Kick questionnaire    Fear of Current or Ex-Partner: No    Emotionally Abused: No    Physically Abused: No    Sexually Abused: No   Family History  Problem Relation Age of Onset   Colon cancer Maternal  Grandmother    Arthritis Maternal Grandmother    Alzheimer's disease Mother    Hyperlipidemia Mother    Hypertension Mother    Cancer Father    Alcohol abuse Father    Diabetes Daughter        Type 1   Diabetes Son    Breast cancer Neg Hx    Celiac disease Neg Hx    Cirrhosis Neg Hx    Clotting disorder Neg Hx    Colitis Neg Hx    Colon polyps Neg Hx    Crohn's disease Neg Hx    Cystic fibrosis Neg Hx    Esophageal cancer Neg Hx  Heart disease Neg Hx    Hemochromatosis Neg Hx    Inflammatory bowel disease Neg Hx    Irritable bowel syndrome Neg Hx    Kidney disease Neg Hx    Liver cancer Neg Hx    Liver disease Neg Hx    Ovarian cancer Neg Hx    Pancreatic cancer Neg Hx    Prostate cancer Neg Hx    Rectal cancer Neg Hx    Stomach cancer Neg Hx    Ulcerative colitis Neg Hx    Uterine cancer Neg Hx    Wilson's disease Neg Hx     Objective: Office vital signs reviewed. BP (!) 147/72   Pulse 70   Temp 97.8 F (36.6 C)   Ht '4\' 9"'$  (1.448 m)   Wt 119 lb (54 kg)   SpO2 94%   BMI 25.75 kg/m   Physical Examination:  General: Awake, alert, well nourished, No acute distress HEENT: Normal    Neck: No masses palpated. No lymphadenopathy    Ears: Tympanic membranes intact, normal light reflex, no erythema, no bulging    Eyes: PERRLA, extraocular membranes intact, sclera white    Nose: nasal turbinates moist, no nasal discharge    Throat: moist mucus membranes, no erythema, no tonsillar exudate.  Airway is patent Cardio: regular rate and rhythm, S1S2 heard, no murmurs appreciated Pulm: Global expiratory wheezes appreciated with fair air movement.  Coughing on exam.   Assessment/ Plan: 66 y.o. female   COPD exacerbation ([place]) - Plan: Novel Coronavirus, NAA ([facility]), Veritor Flu A/B Waived, RSV Ag, EIA, methylPREDNISolone acetate (DEPO-MEDROL) injection 40 mg, predniSONE (DELTASONE) 20 MG tablet, cefdinir (OMNICEF) 300 MG capsule, promethazine-dextromethorphan  (PROMETHAZINE-DM) 6.25-15 MG/5ML syrup, benzonatate (TESSALON PERLES) 100 MG capsule  Negative for RSV, flu and COVID is pending.  She was given Depo-Medrol intramuscularly, DuoNeb administered which did show improvement in wheezes and prescribed prednisone to complete a 5-day burst starting tomorrow.  Omnicef prescribed for presumed COPD exacerbation.  Cough syrup and Tessalon Perles also prescribed.  She will follow-up as needed  No orders of the defined types were placed in this encounter.  No orders of the defined types were placed in this encounter.    [name], DO [place] [phone]

## 2022-07-17 NOTE — Addendum Note (Signed)
Addended by: Everlean Cherry on: 07/17/2022 10:55 AM   Modules accepted: Orders

## 2022-07-17 NOTE — Patient Instructions (Signed)
Start prednisone tomorrow. You were given a shot today. COVID test should be back in a couple days.  In the meantime, I'm treating you as a COPD exacerbation with antibiotics and cough meds.

## 2022-07-17 NOTE — Addendum Note (Signed)
Addended by: Everlean Cherry on: 07/17/2022 10:58 AM   Modules accepted: Orders

## 2022-07-17 NOTE — Addendum Note (Signed)
Addended by: Everlean Cherry on: 07/17/2022 10:37 AM   Modules accepted: Orders

## 2022-07-18 LAB — NOVEL CORONAVIRUS, NAA: SARS-CoV-2, NAA: NOT DETECTED

## 2022-07-21 ENCOUNTER — Telehealth: Payer: Self-pay

## 2022-07-21 NOTE — Telephone Encounter (Signed)
Received notification from [employer] regarding RE-ENROLLMENT approval for BREZTRI. Patient assistance approved from 10/02/22 to 10/02/23.  Phone: [phone]  

## 2022-07-27 ENCOUNTER — Ambulatory Visit (INDEPENDENT_AMBULATORY_CARE_PROVIDER_SITE_OTHER): Payer: Medicare HMO | Admitting: Nurse Practitioner

## 2022-07-27 ENCOUNTER — Encounter: Payer: Self-pay | Admitting: Nurse Practitioner

## 2022-07-27 ENCOUNTER — Ambulatory Visit (INDEPENDENT_AMBULATORY_CARE_PROVIDER_SITE_OTHER): Payer: Medicare HMO

## 2022-07-27 VITALS — BP 119/65 | HR 63 | Temp 97.2°F | Resp 20 | Ht <= 58 in | Wt 119.0 lb

## 2022-07-27 DIAGNOSIS — R0989 Other specified symptoms and signs involving the circulatory and respiratory systems: Secondary | ICD-10-CM | POA: Diagnosis not present

## 2022-07-27 DIAGNOSIS — J069 Acute upper respiratory infection, unspecified: Secondary | ICD-10-CM | POA: Diagnosis not present

## 2022-07-27 NOTE — Progress Notes (Signed)
Subjective:    Patient ID: Monet North, female    DOB: 08/24/1956, 66 y.o.   MRN: [id number]  Chief Complaint: Ear Pain and chest congestion   HPI Patient comes in stating she is feeling no better. She was seen on 07/17/22 with COPD exacerbation. She was given omnicef, prednisone and promethazine. She says she is still coughing and is congested. Her left ear feels stopped up.    Review of Systems  Constitutional:  Positive for chills. Negative for fatigue and fever.  HENT:  Positive for congestion and ear pain (bil). Negative for sinus pressure, sore throat and voice change.   Respiratory:  Positive for cough.   Neurological:  Negative for dizziness and headaches.       Objective:   Physical Exam Vitals reviewed.  Constitutional:      Appearance: Normal appearance.  HENT:     Right Ear: Tympanic membrane normal.     Left Ear: Tympanic membrane normal.     Nose: Nose normal.     Mouth/Throat:     Mouth: Mucous membranes are dry.  Eyes:     Extraocular Movements: Extraocular movements intact.     Pupils: Pupils are equal, round, and reactive to light.  Cardiovascular:     Rate and Rhythm: Normal rate and regular rhythm.     Heart sounds: Normal heart sounds.  Pulmonary:     Effort: Pulmonary effort is normal.     Breath sounds: Normal breath sounds.  Skin:    General: Skin is warm.  Neurological:     General: No focal deficit present.     Mental Status: She is alert and oriented to person, place, and time.  Psychiatric:        Mood and Affect: Mood normal.        Behavior: Behavior normal.   BP 119/65   Pulse 63   Temp (!) 97.2 F (36.2 C) (Temporal)   Resp 20   Ht '4\' 9"'$  (1.448 m)   Wt 119 lb (54 kg)   SpO2 96%   BMI 25.75 kg/m  Chest xray- mild bronchitic changes-Preliminary reading by Ronnald Collum, FNP  [facility]         Assessment & Plan:   Kalisha Keadle in today with chief complaint of Ear Pain and chest congestion   1. URI with cough and  congestion 1. Take meds as prescribed 2. Use a cool mist humidifier especially during the winter months and when heat has been humid. 3. Use saline nose sprays frequently 4. Saline irrigations of the nose can be very helpful if done frequently.  * 4X daily for 1 week*  * Use of a nettie pot can be helpful with this. Follow directions with this* 5. Drink plenty of fluids 6. Keep thermostat turn down low 7.For any cough or congestion- mucinex OTC 8. For fever or aces or pains- take tylenol or ibuprofen appropriate for age and weight.  * for fevers greater than 101 orally you may alternate ibuprofen and tylenol every  3 hours.    - DG Chest 2 View    The above assessment and management plan was discussed with the patient. The patient verbalized understanding of and has agreed to the management plan. Patient is aware to call the clinic if symptoms persist or worsen. Patient is aware when to return to the clinic for a follow-up visit. Patient educated on when it is appropriate to go to the emergency department.   Mary-Margaret Hassell Done, FNP

## 2022-07-27 NOTE — Patient Instructions (Signed)

## 2022-07-31 ENCOUNTER — Telehealth: Payer: Self-pay | Admitting: Family Medicine

## 2022-07-31 DIAGNOSIS — M961 Postlaminectomy syndrome, not elsewhere classified: Secondary | ICD-10-CM

## 2022-07-31 DIAGNOSIS — G894 Chronic pain syndrome: Secondary | ICD-10-CM

## 2022-07-31 DIAGNOSIS — M2550 Pain in unspecified joint: Secondary | ICD-10-CM

## 2022-07-31 NOTE — Telephone Encounter (Signed)
Pt aware.

## 2022-07-31 NOTE — Telephone Encounter (Signed)
Orders Placed This Encounter  Procedures   Ambulatory referral to Pain Clinic

## 2022-08-04 ENCOUNTER — Ambulatory Visit (INDEPENDENT_AMBULATORY_CARE_PROVIDER_SITE_OTHER): Payer: Medicare HMO | Admitting: Family Medicine

## 2022-08-04 ENCOUNTER — Encounter: Payer: Self-pay | Admitting: Family Medicine

## 2022-08-04 VITALS — BP 123/67 | HR 62 | Temp 97.3°F | Ht <= 58 in | Wt 118.8 lb

## 2022-08-04 DIAGNOSIS — D696 Thrombocytopenia, unspecified: Secondary | ICD-10-CM | POA: Diagnosis not present

## 2022-08-04 DIAGNOSIS — E1169 Type 2 diabetes mellitus with other specified complication: Secondary | ICD-10-CM

## 2022-08-04 DIAGNOSIS — E785 Hyperlipidemia, unspecified: Secondary | ICD-10-CM | POA: Diagnosis not present

## 2022-08-04 DIAGNOSIS — J441 Chronic obstructive pulmonary disease with (acute) exacerbation: Secondary | ICD-10-CM | POA: Diagnosis not present

## 2022-08-04 DIAGNOSIS — E119 Type 2 diabetes mellitus without complications: Secondary | ICD-10-CM | POA: Diagnosis not present

## 2022-08-04 LAB — BAYER DCA HB A1C WAIVED: HB A1C (BAYER DCA - WAIVED): 6.6 % — ABNORMAL HIGH (ref 4.8–5.6)

## 2022-08-04 MED ORDER — DOXYCYCLINE HYCLATE 100 MG PO TABS: 100.0000 mg | ORAL_TABLET | Freq: Two times a day (BID) | ORAL | 0 refills | Status: AC

## 2022-08-04 NOTE — Progress Notes (Signed)
Subjective: CC:Dm PCP: Lori Norlander, DO HRC:BULAG Lori Schroeder is a 66 y.o. female presenting to clinic today for:  1. Type 2 Diabetes with hypertension, hyperlipidemia:  Compliant with Trulicity, Lipitor  Last eye exam: Up-to-date Last foot exam: Up-to-date Last A1c:  Lab Results  Component Value Date   HGBA1C 6.0 (H) 11/28/2021   Nephropathy screen indicated?:  Up-to-date Last flu, zoster and/or pneumovax:  Immunization History  Administered Date(s) Administered   Influenza-Unspecified 10/02/2018   Pneumococcal Conjugate-13 05/29/2018   Tdap 05/29/2018    ROS: No chest pain.  She reports some chronic coughing and shortness of breath its been ongoing since she had been treated for acute COPD exacerbation earlier in October.  She has since done a video visit but symptoms were not improving at that time so she was placed on additional cough medications.  She reports mild to brown intermittently in her sputum.  She has some left ear fullness.  No hemoptysis.  Compliant with her medications.  Told recently that she had low platelets that she would like to get that checked out.  Denies any spontaneous bleeding.  No significant bruising outside of normal.    ROS: Per HPI  Allergies  Allergen Reactions   Hydroxychloroquine Itching and Rash    Diffuse drug rash   Sulfa Antibiotics    Past Medical History:  Diagnosis Date   Blood transfusion without reported diagnosis    External hemorrhoids without mention of complication [id number]   Colonoscopy   Family history of malignant neoplasm of gastrointestinal tract    Family history of malignant neoplasm of gastrointestinal tract    GERD (gastroesophageal reflux disease)    Hyperlipemia    Hypertension     Current Outpatient Medications:    albuterol (VENTOLIN HFA) 108 (90 Base) MCG/ACT inhaler, INHALE 2 PUFFS INTO LUNGS EVERY 6 HOURS AS NEEDED FOR COUGH & WHEEZING, Disp: 8.5 g, Rfl: 0   atorvastatin (LIPITOR) 10 MG tablet,  Take 1 tablet (10 mg total) by mouth daily., Disp: 90 tablet, Rfl: 3   benzonatate (TESSALON PERLES) 100 MG capsule, Take 1 capsule (100 mg total) by mouth 3 (three) times daily as needed for cough., Disp: 20 capsule, Rfl: 0   Budeson-Glycopyrrol-Formoterol (BREZTRI AEROSPHERE) 160-9-4.8 MCG/ACT AERO, Inhale 2 puffs into the lungs in the morning and at bedtime., Disp: 32.1 g, Rfl: 6   clindamycin (CLEOCIN-T) 1 % external solution, Apply topically 2 (two) times daily. Until clear, Disp: 30 mL, Rfl: 12   Dulaglutide (TRULICITY) 1.5 HO/1.2YQ SOPN, Inject 1.5 mg into the skin once a week. INJECT 1.5MG ONCE A WEEK, Disp: 6 mL, Rfl: 3   ergocalciferol (VITAMIN D2) 1.25 MG (50000 UT) capsule, ergocalciferol (vitamin D2) 1,250 mcg (50,000 unit) capsule  TAKE ONE CAPSULE EVERY 7 DAYS FOR 12 DOSES, THEN START OTC 400 IU DAILY, Disp: , Rfl:    esomeprazole (NEXIUM) 40 MG capsule, Take 1 capsule (40 mg total) by mouth 2 (two) times daily before a meal., Disp: 180 capsule, Rfl: 3   fluticasone (FLONASE) 50 MCG/ACT nasal spray, Place 2 sprays into both nostrils daily., Disp: 16 g, Rfl: 6   guaiFENesin (MUCINEX) 600 MG 12 hr tablet, Take 1 tablet (600 mg total) by mouth 2 (two) times daily., Disp: 30 tablet, Rfl: 0   HYDROcodone-acetaminophen (NORCO/VICODIN) 5-325 MG tablet, SMARTSIG:0.5-1 Tablet(s) By Mouth 1-2 Times Daily PRN, Disp: , Rfl:    Multiple Vitamins-Minerals (HAIR SKIN AND NAILS FORMULA PO), Take by mouth., Disp: , Rfl:  naloxone (NARCAN) nasal spray 4 mg/0.1 mL, See admin instructions., Disp: , Rfl:    pregabalin (LYRICA) 100 MG capsule, Take 100 mg by mouth in the morning and at bedtime. Am and afternoon, Disp: , Rfl:    pregabalin (LYRICA) 300 MG capsule, Take 300 mg by mouth at bedtime., Disp: , Rfl:    promethazine-dextromethorphan (PROMETHAZINE-DM) 6.25-15 MG/5ML syrup, Take 2.5 mLs by mouth 4 (four) times daily as needed for cough., Disp: 118 mL, Rfl: 0   tiZANidine (ZANAFLEX) 4 MG tablet, TAKE  ONE TABLET BY MOUTH THREE TIMES DAILY AS NEEDED FOR MUSCLE SPASM, Disp: , Rfl:    triamcinolone cream (KENALOG) 0.1 %, Apply 1 Application topically 2 (two) times daily., Disp: 30 g, Rfl: 0 Social History   Socioeconomic History   Marital status: Married    Spouse name: Christia Reading   Number of children: 2   Years of education: 14   Highest education level: Associate degree: occupational, Hotel manager, or vocational program  Occupational History   Occupation: disability    Employer: DISABLED  Tobacco Use   Smoking status: Every Day    Packs/day: 0.50    Years: 10.00    Total pack years: 5.00    Types: Cigarettes   Smokeless tobacco: Never  Vaping Use   Vaping Use: Never used  Substance and Sexual Activity   Alcohol use: No   Drug use: No   Sexual activity: Not on file  Other Topics Concern   Not on file  Social History Narrative   Not on file   Social Determinants of Health   Financial Resource Strain: Low Risk  (05/02/2022)   Overall Financial Resource Strain (CARDIA)    Difficulty of Paying Living Expenses: Not hard at all  Food Insecurity: No Food Insecurity (05/02/2022)   Hunger Vital Sign    Worried About Running Out of Food in the Last Year: Never true    [place] in the Last Year: Never true  Transportation Needs: No Transportation Needs (05/02/2022)   PRAPARE - Hydrologist (Medical): No    Lack of Transportation (Non-Medical): No  Physical Activity: Insufficiently Active (05/02/2022)   Exercise Vital Sign    Days of Exercise per Week: 3 days    Minutes of Exercise per Session: 30 min  Stress: No Stress Concern Present (05/02/2022)   [place]    Feeling of Stress : Not at all  Social Connections: Moderately Integrated (05/02/2022)   Social Connection and Isolation Panel [NHANES]    Frequency of Communication with Friends and Family: More than three times a week    Frequency  of Social Gatherings with Friends and Family: Once a week    Attends Religious Services: More than 4 times per year    Active Member of [employer] or Organizations: No    Attends Archivist Meetings: Never    Marital Status: Married  Human resources officer Violence: Not At Risk (05/02/2022)   Humiliation, Afraid, Rape, and Kick questionnaire    Fear of Current or Ex-Partner: No    Emotionally Abused: No    Physically Abused: No    Sexually Abused: No   Family History  Problem Relation Age of Onset   Colon cancer Maternal Grandmother    Arthritis Maternal Grandmother    Alzheimer's disease Mother    Hyperlipidemia Mother    Hypertension Mother    Cancer Father    Alcohol abuse Father  Diabetes Daughter        Type 1   Diabetes Son    Breast cancer Neg Hx    Celiac disease Neg Hx    Cirrhosis Neg Hx    Clotting disorder Neg Hx    Colitis Neg Hx    Colon polyps Neg Hx    Crohn's disease Neg Hx    Cystic fibrosis Neg Hx    Esophageal cancer Neg Hx    Heart disease Neg Hx    Hemochromatosis Neg Hx    Inflammatory bowel disease Neg Hx    Irritable bowel syndrome Neg Hx    Kidney disease Neg Hx    Liver cancer Neg Hx    Liver disease Neg Hx    Ovarian cancer Neg Hx    Pancreatic cancer Neg Hx    Prostate cancer Neg Hx    Rectal cancer Neg Hx    Stomach cancer Neg Hx    Ulcerative colitis Neg Hx    Uterine cancer Neg Hx    Wilson's disease Neg Hx     Objective: Office vital signs reviewed. BP 123/67   Pulse 62   Temp (!) 97.3 F (36.3 C)   Ht _0  (1.448 m)   Wt 118 lb 12.8 oz (53.9 kg)   SpO2 98%   BMI 25.71 kg/m   Physical Examination:  General: Awake, alert, nontoxic female, No acute distress HEENT: TMs intact bilaterally.  Dulled light reflex.  She has some scant dried cerumen within the external auditory canal on the right. Cardio: regular rate and rhythm, S1S2 heard, no murmurs appreciated Pulm: clear to auscultation bilaterally, no wheezes, rhonchi or  rales; normal work of breathing on room air    Assessment/ Plan: 66 y.o. female   Controlled type 2 diabetes mellitus without complication, without long-term current use of insulin ([place]) - Plan: Bayer DCA Hb A1c Waived, Microalbumin / creatinine urine ratio  Hyperlipidemia associated with type 2 diabetes mellitus ([place]) - Plan: CMP14+EGFR  Low platelet count ([facility]) - Plan: Vitamin B12, Iron, CBC with Differential  COPD with acute exacerbation ([facility]) - Plan: doxycycline (VIBRA-TABS) 100 MG tablet  Check A1c, urine microalbumin.  Plan for foot exam at next visit  Plan for fasting lipid at next visit  Check CBC, iron, B12 level.  We discussed multiple etiologies of low platelet counts including liver mediated etiology, acute infection, bone marrow disease and vitamin deficiencies.  We also discussed that sometimes this is simply idiopathic.  We will evaluate for reasons why she may have had a low blood count.  If needed, may need to consider referral to hematology.  Ongoing bronchitis with brown sputum despite treatment with Omnicef/ prednison earlier in October, cough suppressants just a couple of weeks ago.  I am placing her on a round of doxycycline but if no significant improvement, may need to consider CAT scan and referral back to pulmonology.  Unfortunately continues to smoke  No orders of the defined types were placed in this encounter.  No orders of the defined types were placed in this encounter.    Lori Norlander, DO [place] [phone]

## 2022-08-05 LAB — CMP14+EGFR
ALT: 16 IU/L (ref 0–32)
AST: 17 IU/L (ref 0–40)
Albumin/Globulin Ratio: 1.9 (ref 1.2–2.2)
Albumin: 4.8 g/dL (ref 3.9–4.9)
Alkaline Phosphatase: 93 IU/L (ref 44–121)
BUN/Creatinine Ratio: 13 (ref 12–28)
BUN: 8 mg/dL (ref 8–27)
Bilirubin Total: 0.4 mg/dL (ref 0.0–1.2)
CO2: 26 mmol/L (ref 20–29)
Calcium: 9.7 mg/dL (ref 8.7–10.3)
Chloride: 102 mmol/L (ref 96–106)
Creatinine, Ser: 0.61 mg/dL (ref 0.57–1.00)
Globulin, Total: 2.5 g/dL (ref 1.5–4.5)
Glucose: 83 mg/dL (ref 70–99)
Potassium: 4.4 mmol/L (ref 3.5–5.2)
Sodium: 140 mmol/L (ref 134–144)
Total Protein: 7.3 g/dL (ref 6.0–8.5)
eGFR: 99 mL/min/{1.73_m2} (ref >59)

## 2022-08-05 LAB — MICROALBUMIN / CREATININE URINE RATIO
Creatinine, Urine: 25.5 mg/dL
Microalb/Creat Ratio: <12 mg/g creat (ref 0–29)
Microalbumin, Urine: <3 ug/mL

## 2022-08-05 LAB — CBC WITH DIFFERENTIAL/PLATELET
Basophils Absolute: 0.1 10*3/uL (ref 0.0–0.2)
Basos: 1 %
EOS (ABSOLUTE): 0 10*3/uL (ref 0.0–0.4)
Eos: 0 %
Hematocrit: 43 % (ref 34.0–46.6)
Hemoglobin: 14.3 g/dL (ref 11.1–15.9)
Immature Grans (Abs): 0 10*3/uL (ref 0.0–0.1)
Immature Granulocytes: 0 %
Lymphocytes Absolute: 3.1 10*3/uL (ref 0.7–3.1)
Lymphs: 27 %
MCH: 30 pg (ref 26.6–33.0)
MCHC: 33.3 g/dL (ref 31.5–35.7)
MCV: 90 fL (ref 79–97)
Monocytes Absolute: 0.6 10*3/uL (ref 0.1–0.9)
Monocytes: 5 %
Neutrophils Absolute: 7.6 10*3/uL — ABNORMAL HIGH (ref 1.4–7.0)
Neutrophils: 67 %
Platelets: 443 10*3/uL (ref 150–450)
RBC: 4.76 x10E6/uL (ref 3.77–5.28)
RDW: 13.6 % (ref 11.7–15.4)
WBC: 11.4 10*3/uL — ABNORMAL HIGH (ref 3.4–10.8)

## 2022-08-05 LAB — VITAMIN B12: Vitamin B-12: 432 pg/mL (ref 232–1245)

## 2022-08-05 LAB — IRON: Iron: 124 ug/dL (ref 27–139)

## 2022-08-10 DIAGNOSIS — Z981 Arthrodesis status: Secondary | ICD-10-CM | POA: Diagnosis not present

## 2022-08-10 DIAGNOSIS — G894 Chronic pain syndrome: Secondary | ICD-10-CM | POA: Diagnosis not present

## 2022-08-10 NOTE — Telephone Encounter (Signed)
Disregard previous note - patient eligible for 2024 re-enrollment.

## 2022-08-31 DIAGNOSIS — Z981 Arthrodesis status: Secondary | ICD-10-CM | POA: Diagnosis not present

## 2022-08-31 DIAGNOSIS — M545 Low back pain, unspecified: Secondary | ICD-10-CM | POA: Diagnosis not present

## 2022-08-31 DIAGNOSIS — M5124 Other intervertebral disc displacement, thoracic region: Secondary | ICD-10-CM | POA: Diagnosis not present

## 2022-08-31 DIAGNOSIS — M546 Pain in thoracic spine: Secondary | ICD-10-CM | POA: Diagnosis not present

## 2022-09-05 DIAGNOSIS — L84 Corns and callosities: Secondary | ICD-10-CM | POA: Diagnosis not present

## 2022-09-05 DIAGNOSIS — E1142 Type 2 diabetes mellitus with diabetic polyneuropathy: Secondary | ICD-10-CM | POA: Diagnosis not present

## 2022-09-05 DIAGNOSIS — M79676 Pain in unspecified toe(s): Secondary | ICD-10-CM | POA: Diagnosis not present

## 2022-09-05 DIAGNOSIS — B351 Tinea unguium: Secondary | ICD-10-CM | POA: Diagnosis not present

## 2022-09-07 DIAGNOSIS — M961 Postlaminectomy syndrome, not elsewhere classified: Secondary | ICD-10-CM | POA: Diagnosis not present

## 2022-09-12 ENCOUNTER — Other Ambulatory Visit: Payer: Self-pay | Admitting: Family Medicine

## 2022-09-12 DIAGNOSIS — J410 Simple chronic bronchitis: Secondary | ICD-10-CM

## 2022-10-18 ENCOUNTER — Telehealth: Payer: Self-pay | Admitting: Family Medicine

## 2022-10-18 DIAGNOSIS — M5030 Other cervical disc degeneration, unspecified cervical region: Secondary | ICD-10-CM | POA: Diagnosis not present

## 2022-10-18 DIAGNOSIS — Z79891 Long term (current) use of opiate analgesic: Secondary | ICD-10-CM | POA: Diagnosis not present

## 2022-10-18 DIAGNOSIS — M545 Low back pain, unspecified: Secondary | ICD-10-CM | POA: Diagnosis not present

## 2022-10-18 DIAGNOSIS — G894 Chronic pain syndrome: Secondary | ICD-10-CM | POA: Diagnosis not present

## 2022-10-18 NOTE — Telephone Encounter (Signed)
Patient calling to see if we have samples of Trulicity

## 2022-10-18 NOTE — Telephone Encounter (Signed)
Informed pt that we do not have any Trulicity samples at this time.

## 2022-11-13 DIAGNOSIS — Z5181 Encounter for therapeutic drug level monitoring: Secondary | ICD-10-CM | POA: Diagnosis not present

## 2022-11-20 DIAGNOSIS — G894 Chronic pain syndrome: Secondary | ICD-10-CM | POA: Diagnosis not present

## 2022-11-20 DIAGNOSIS — M62830 Muscle spasm of back: Secondary | ICD-10-CM | POA: Diagnosis not present

## 2022-11-20 DIAGNOSIS — M5416 Radiculopathy, lumbar region: Secondary | ICD-10-CM | POA: Diagnosis not present

## 2022-11-20 DIAGNOSIS — R202 Paresthesia of skin: Secondary | ICD-10-CM | POA: Diagnosis not present

## 2022-11-23 ENCOUNTER — Ambulatory Visit (INDEPENDENT_AMBULATORY_CARE_PROVIDER_SITE_OTHER): Payer: Medicare HMO | Admitting: Family Medicine

## 2022-11-23 ENCOUNTER — Ambulatory Visit (INDEPENDENT_AMBULATORY_CARE_PROVIDER_SITE_OTHER): Payer: Medicare HMO

## 2022-11-23 ENCOUNTER — Encounter: Payer: Self-pay | Admitting: Family Medicine

## 2022-11-23 VITALS — BP 132/74 | HR 77 | Temp 97.0°F | Resp 20 | Ht <= 58 in | Wt 115.0 lb

## 2022-11-23 DIAGNOSIS — J441 Chronic obstructive pulmonary disease with (acute) exacerbation: Secondary | ICD-10-CM | POA: Diagnosis not present

## 2022-11-23 DIAGNOSIS — R062 Wheezing: Secondary | ICD-10-CM

## 2022-11-23 DIAGNOSIS — R059 Cough, unspecified: Secondary | ICD-10-CM | POA: Diagnosis not present

## 2022-11-23 MED ORDER — AMOXICILLIN-POT CLAVULANATE 875-125 MG PO TABS: 1.0000 | ORAL_TABLET | Freq: Two times a day (BID) | ORAL | 0 refills | Status: AC

## 2022-11-23 MED ORDER — PREDNISONE 20 MG PO TABS: 40.0000 mg | ORAL_TABLET | Freq: Every day | ORAL | 0 refills | Status: AC

## 2022-11-23 NOTE — Progress Notes (Signed)
Acute Office Visit  Subjective:     Patient ID: Lori Schroeder, female    DOB: [birth year], 67 y.o.   MRN: [id number]  Chief Complaint  Patient presents with   URI    URI   Patient is in today for URI symptoms with wheezing Woke up Sunday with bilateral ear pain, chest tightness, cough productive with green mucous and PND.  Went to pain management on Monday and provider said that she had a lot of wheezing. Previously told that she might have COPD, has not had formal work up. States that she is taking her inhaler, but does not think it is helping. Taking cough drops to help. Denies fever.   ROS As per HPI     Objective:    BP 132/74   Pulse 77   Temp (!) 97 F (36.1 C) (Oral)   Resp 20   Ht 4' 9"$  (1.448 m)   Wt 115 lb (52.2 kg)   SpO2 98%   BMI 24.89 kg/m    Physical Exam Constitutional:      General: She is not in acute distress.    Appearance: Normal appearance. She is not ill-appearing, toxic-appearing or diaphoretic.  HENT:     Right Ear: A middle ear effusion is present.     Left Ear: A middle ear effusion is present.     Nose: Congestion and rhinorrhea present.     Mouth/Throat:     Mouth: Mucous membranes are moist.     Pharynx: Oropharyngeal exudate and posterior oropharyngeal erythema present.  Eyes:     Conjunctiva/sclera: Conjunctivae normal.  Cardiovascular:     Rate and Rhythm: Normal rate.     Pulses: Normal pulses.     Heart sounds: Normal heart sounds. No murmur heard.    No gallop.  Pulmonary:     Effort: No respiratory distress.     Breath sounds: No stridor. Examination of the right-upper field reveals decreased breath sounds. Examination of the left-upper field reveals decreased breath sounds. Examination of the right-middle field reveals wheezing. Examination of the left-middle field reveals wheezing. Examination of the right-lower field reveals wheezing and rales. Examination of the left-lower field reveals wheezing and rales. Decreased  breath sounds, wheezing and rales present. No rhonchi.  Skin:    General: Skin is warm.     Capillary Refill: Capillary refill takes less than 2 seconds.  Neurological:     General: No focal deficit present.     Mental Status: She is alert and oriented to person, place, and time. Mental status is at baseline.     Motor: No weakness.  Psychiatric:        Mood and Affect: Mood normal.        Behavior: Behavior normal.        Thought Content: Thought content normal.        Judgment: Judgment normal.       Assessment & Plan:  1. Wheezing 2. COPD exacerbation ([place]) Discussed with patient that this was likely a COPD exacerbation. Due to symptoms of increased viscosity of sputum and increased dyspnea will treat as below. In addition pt has risk factors of 67 y/o and tobacco use disorder. Decided with shared decision making with patient to complete imaging as below. Will communicate results to patient once available. - DG Chest 2 View; Future - predniSONE (DELTASONE) 20 MG tablet; Take 2 tablets (40 mg total) by mouth daily with breakfast for 5 days.  Dispense: 10 tablet; Refill: 0 -  amoxicillin-clavulanate (AUGMENTIN) 875-125 MG tablet; Take 1 tablet by mouth 2 (two) times daily for 7 days.  Dispense: 14 tablet; Refill: 0  The above assessment and management plan was discussed with the patient. The patient verbalized understanding of and has agreed to the management plan using shared-decision making. Patient is aware to call the clinic if they develop any new symptoms or if symptoms fail to improve or worsen. Patient is aware when to return to the clinic for a follow-up visit. Patient educated on when it is appropriate to go to the emergency department.   Return if symptoms worsen or fail to improve.  Donzetta Kohut, DNP-FNP [facility] [address] [place], [place] [address] [phone]

## 2022-12-18 ENCOUNTER — Other Ambulatory Visit: Payer: Self-pay | Admitting: Family Medicine

## 2022-12-18 DIAGNOSIS — M62830 Muscle spasm of back: Secondary | ICD-10-CM | POA: Diagnosis not present

## 2022-12-18 DIAGNOSIS — Z1231 Encounter for screening mammogram for malignant neoplasm of breast: Secondary | ICD-10-CM

## 2022-12-18 DIAGNOSIS — M545 Low back pain, unspecified: Secondary | ICD-10-CM | POA: Diagnosis not present

## 2022-12-18 DIAGNOSIS — M5416 Radiculopathy, lumbar region: Secondary | ICD-10-CM | POA: Diagnosis not present

## 2022-12-18 DIAGNOSIS — R202 Paresthesia of skin: Secondary | ICD-10-CM | POA: Diagnosis not present

## 2022-12-20 ENCOUNTER — Ambulatory Visit
Admission: RE | Admit: 2022-12-20 | Discharge: 2022-12-20 | Disposition: A | Discharge status: Home / Self Care | Payer: Medicare HMO | Source: Ambulatory Visit | Attending: Family Medicine | Admitting: Family Medicine

## 2022-12-20 DIAGNOSIS — Z1231 Encounter for screening mammogram for malignant neoplasm of breast: Secondary | ICD-10-CM

## 2023-02-02 ENCOUNTER — Telehealth: Payer: Self-pay | Admitting: *Deleted

## 2023-02-02 NOTE — Telephone Encounter (Signed)
Pt calling in wanting to be seen for what she says is URI that will turn into pneumonia if she doesn't get a steroid as she gets this 3 times a year. Pt isn't c'o chest pain but is having some SOB due to cough and wanted to see Dr Reece Agar. Advised her Dr Reece Agar doesn't have any openings and we don't have anything left in office today but she can go to ED or UC for evaluation and pt voiced understanding.

## 2023-02-07 ENCOUNTER — Ambulatory Visit (INDEPENDENT_AMBULATORY_CARE_PROVIDER_SITE_OTHER): Payer: Medicare HMO | Admitting: Family Medicine

## 2023-02-07 ENCOUNTER — Ambulatory Visit (INDEPENDENT_AMBULATORY_CARE_PROVIDER_SITE_OTHER): Payer: Medicare HMO

## 2023-02-07 ENCOUNTER — Encounter: Payer: Self-pay | Admitting: Family Medicine

## 2023-02-07 VITALS — BP 118/70 | HR 70 | Temp 98.7°F | Ht <= 58 in | Wt 116.0 lb

## 2023-02-07 DIAGNOSIS — M25512 Pain in left shoulder: Secondary | ICD-10-CM

## 2023-02-07 DIAGNOSIS — R079 Chest pain, unspecified: Secondary | ICD-10-CM | POA: Diagnosis not present

## 2023-02-07 MED ORDER — PREDNISONE 20 MG PO TABS
40.0000 mg | ORAL_TABLET | Freq: Every day | ORAL | 0 refills | Status: AC
Start: 2023-02-07 — End: 2023-02-12

## 2023-02-07 NOTE — Progress Notes (Signed)
Acute Office Visit  Subjective:  Patient ID: Lori Schroeder, female    DOB: 1956/07/24, 67 y.o.   MRN: [id number]  Chief Complaint  Patient presents with   left shoulder pain    Hurts to breath Recurring 3-4 times a year    HPI Patient is in today for shoulder pain. States that this episode started last Wednesday. She has pain with inhalation and is worried about her lungs. States that she is in the beginning  States that she found 3 dexamethasone at home and took them. She states she thought she was getting better, but was not. Rates pain as 4/10. Sometimes pain increases to 8-9/10. Reports that she has not smoked in 4 days. Reports that she used her heating pad and it felt a little better.  She has been taking Breztri and albuterol. States that these do not seem to be helping her symptoms. At her last flare she had increased cough and sputum production, but she does not have that with this flare.   ROS As per HPI  Objective:  BP 118/70   Pulse 70   Temp 98.7 F (37.1 C)   Ht 4\' 9"  (1.448 m)   Wt 116 lb (52.6 kg)   SpO2 99%   BMI 25.10 kg/m    Physical Exam Constitutional:      General: She is awake. She is not in acute distress.    Appearance: Normal appearance. She is well-developed and well-groomed.     Interventions: She is not intubated. Cardiovascular:     Rate and Rhythm: Normal rate and regular rhythm. No extrasystoles are present.    Pulses:          Radial pulses are 2+ on the right side and 2+ on the left side.       Posterior tibial pulses are 2+ on the right side and 2+ on the left side.     Heart sounds: Normal heart sounds.  Pulmonary:     Effort: Prolonged expiration present. No tachypnea, bradypnea, accessory muscle usage, respiratory distress or retractions. She is not intubated.     Comments: Hyperresonance  Musculoskeletal:       Arms:     Right lower leg: No edema.     Left lower leg: No edema.     Comments: Location of pain. Not worse on  palpation.   Skin:    General: Skin is warm.     Capillary Refill: Capillary refill takes less than 2 seconds.  Neurological:     General: No focal deficit present.     Mental Status: She is alert and oriented to person, place, and time.  Psychiatric:        Attention and Perception: Attention and perception normal.        Mood and Affect: Mood and affect normal.        Speech: Speech normal.        Behavior: Behavior normal. Behavior is cooperative.        Thought Content: Thought content normal.        Cognition and Memory: Cognition and memory normal.        Judgment: Judgment normal.    Assessment & Plan:  1. Acute pain of left shoulder Imaging as below.  Will begin with conservative management as below. Patient to return if symptoms do not improve. Wells Critieria reviewed and low concern for PE at this time. Discussed reasons to follow up emergently.  - DG Chest 2 View; Future -  predniSONE (DELTASONE) 20 MG tablet; Take 2 tablets (40 mg total) by mouth daily with breakfast for 5 days.  Dispense: 10 tablet; Refill: 0  The above assessment and management plan was discussed with the patient. The patient verbalized understanding of and has agreed to the management plan using shared-decision making. Patient is aware to call the clinic if they develop any new symptoms or if symptoms fail to improve or worsen. Patient is aware when to return to the clinic for a follow-up visit. Patient educated on when it is appropriate to go to the emergency department.   Return if symptoms worsen or fail to improve.  Neale Burly, DNP-FNP [facility] [address] [place], [place] [address] [phone]

## 2023-02-08 ENCOUNTER — Other Ambulatory Visit: Payer: Self-pay | Admitting: Family Medicine

## 2023-02-08 DIAGNOSIS — J410 Simple chronic bronchitis: Secondary | ICD-10-CM

## 2023-02-19 DIAGNOSIS — G894 Chronic pain syndrome: Secondary | ICD-10-CM | POA: Diagnosis not present

## 2023-02-19 DIAGNOSIS — M5416 Radiculopathy, lumbar region: Secondary | ICD-10-CM | POA: Diagnosis not present

## 2023-02-19 DIAGNOSIS — M545 Low back pain, unspecified: Secondary | ICD-10-CM | POA: Diagnosis not present

## 2023-02-19 DIAGNOSIS — M62830 Muscle spasm of back: Secondary | ICD-10-CM | POA: Diagnosis not present

## 2023-02-19 DIAGNOSIS — Z79891 Long term (current) use of opiate analgesic: Secondary | ICD-10-CM | POA: Diagnosis not present

## 2023-02-19 DIAGNOSIS — R202 Paresthesia of skin: Secondary | ICD-10-CM | POA: Diagnosis not present

## 2023-02-19 DIAGNOSIS — F1910 Other psychoactive substance abuse, uncomplicated: Secondary | ICD-10-CM | POA: Diagnosis not present

## 2023-03-19 DIAGNOSIS — R202 Paresthesia of skin: Secondary | ICD-10-CM | POA: Diagnosis not present

## 2023-03-19 DIAGNOSIS — M5416 Radiculopathy, lumbar region: Secondary | ICD-10-CM | POA: Diagnosis not present

## 2023-03-19 DIAGNOSIS — G894 Chronic pain syndrome: Secondary | ICD-10-CM | POA: Diagnosis not present

## 2023-03-19 DIAGNOSIS — M545 Low back pain, unspecified: Secondary | ICD-10-CM | POA: Diagnosis not present

## 2023-03-19 DIAGNOSIS — M62830 Muscle spasm of back: Secondary | ICD-10-CM | POA: Diagnosis not present

## 2023-04-16 DIAGNOSIS — Z79891 Long term (current) use of opiate analgesic: Secondary | ICD-10-CM | POA: Diagnosis not present

## 2023-04-16 DIAGNOSIS — M5416 Radiculopathy, lumbar region: Secondary | ICD-10-CM | POA: Diagnosis not present

## 2023-05-04 ENCOUNTER — Ambulatory Visit (INDEPENDENT_AMBULATORY_CARE_PROVIDER_SITE_OTHER): Payer: Medicare HMO

## 2023-05-04 ENCOUNTER — Telehealth: Payer: Self-pay | Admitting: Family Medicine

## 2023-05-04 VITALS — Ht <= 58 in | Wt 118.0 lb

## 2023-05-04 DIAGNOSIS — Z0000 Encounter for general adult medical examination without abnormal findings: Secondary | ICD-10-CM

## 2023-05-04 DIAGNOSIS — Z780 Asymptomatic menopausal state: Secondary | ICD-10-CM

## 2023-05-04 DIAGNOSIS — Z122 Encounter for screening for malignant neoplasm of respiratory organs: Secondary | ICD-10-CM

## 2023-05-04 DIAGNOSIS — Z1211 Encounter for screening for malignant neoplasm of colon: Secondary | ICD-10-CM

## 2023-05-04 NOTE — Telephone Encounter (Signed)
Appt made/ pt aware  

## 2023-05-04 NOTE — Progress Notes (Signed)
Subjective:   Lori Schroeder is a 67 y.o. female who presents for Medicare Annual (Subsequent) preventive examination.  Visit Complete: Virtual  I connected with  Lori Schroeder on 05/04/23 by a audio enabled telemedicine application and verified that I am speaking with the correct person using two identifiers.  Patient Location: Home  Provider Location: Home Office  I discussed the limitations of evaluation and management by telemedicine. The patient expressed understanding and agreed to proceed.  Patient Medicare AWV questionnaire was completed by the patient on 05/04/2023; I have confirmed that all information answered by patient is correct and no changes since this date.  Review of Systems    Vital Signs: Unable to obtain new vitals due to this being a telehealth visit.  Cardiac Risk Factors include: advanced age (>37men, >55 women);diabetes mellitus;dyslipidemia;hypertension Nutrition Risk Assessment:  Has the patient had any N/V/D within the last 2 months?  No  Does the patient have any non-healing wounds?  No  Has the patient had any unintentional weight loss or weight gain?  No   Diabetes:  Is the patient diabetic?  Yes  If diabetic, was a CBG obtained today?  No  Did the patient bring in their glucometer from home?  No  How often do you monitor your CBG's? Weekly .   Financial Strains and Diabetes Management:  Are you having any financial strains with the device, your supplies or your medication? No .  Does the patient want to be seen by Chronic Care Management for management of their diabetes?  No  Would the patient like to be referred to a Nutritionist or for Diabetic Management?  No   Diabetic Exams:  Diabetic Eye Exam: Completed 06/2022 Diabetic Foot Exam: Overdue, Pt has been advised about the importance in completing this exam. Pt is scheduled for diabetic foot exam on next office visit .     Objective:    Today's Vitals   05/04/23 1036  Weight: 118 lb (53.5  kg)  Height: 4\' 9"  (1.448 m)   Body mass index is 25.53 kg/m.     05/04/2023   10:39 AM 05/02/2022   11:08 AM 04/29/2021    1:31 PM 04/28/2020    1:23 PM 03/19/2019    3:11 PM 01/18/2016    2:39 PM  Advanced Directives  Does Patient Have a Medical Advance Directive? Yes No No No No No  Type of Estate agent of [place];Living will       Copy of Healthcare Power of Attorney in Chart? No - copy requested       Would patient like information on creating a medical advance directive?  No - Patient declined No - Patient declined No - Patient declined Yes (MAU/Ambulatory/Procedural Areas - Information given)     Current Medications (verified) Outpatient Encounter Medications as of 05/04/2023  Medication Sig   albuterol (VENTOLIN HFA) 108 (90 Base) MCG/ACT inhaler INHALE 2 PUFFS INTO LUNGS EVERY 6 HOURS AS NEEDED FOR COUGH & WHEEZING   atorvastatin (LIPITOR) 10 MG tablet Take 1 tablet (10 mg total) by mouth daily.   Budeson-Glycopyrrol-Formoterol (BREZTRI AEROSPHERE) 160-9-4.8 MCG/ACT AERO Inhale 2 puffs into the lungs in the morning and at bedtime.   clindamycin (CLEOCIN-T) 1 % external solution Apply topically 2 (two) times daily. Until clear   cyclobenzaprine (FLEXERIL) 5 MG tablet Take 5 mg by mouth at bedtime as needed.   Dulaglutide (TRULICITY) 1.5 MG/0.5ML SOPN Inject 1.5 mg into the skin once a week. INJECT 1.5MG  ONCE  A WEEK   esomeprazole (NEXIUM) 40 MG capsule Take 1 capsule (40 mg total) by mouth 2 (two) times daily before a meal.   fluticasone (FLONASE) 50 MCG/ACT nasal spray Place 2 sprays into both nostrils daily.   guaiFENesin (MUCINEX) 600 MG 12 hr tablet Take 1 tablet (600 mg total) by mouth 2 (two) times daily.   HYDROcodone-acetaminophen (NORCO/VICODIN) 5-325 MG tablet SMARTSIG:0.5-1 Tablet(s) By Mouth 1-2 Times Daily PRN   Multiple Vitamin (MULTIVITAMIN ADULT PO) Take 1 tablet by mouth daily.   Multiple Vitamins-Minerals (HAIR SKIN AND NAILS FORMULA PO) Take  by mouth.   naloxone (NARCAN) nasal spray 4 mg/0.1 mL See admin instructions.   triamcinolone cream (KENALOG) 0.1 % Apply 1 Application topically 2 (two) times daily.   No facility-administered encounter medications on file as of 05/04/2023.    Allergies (verified) Hydroxychloroquine and Sulfa antibiotics   History: Past Medical History:  Diagnosis Date   Blood transfusion without reported diagnosis    External hemorrhoids without mention of complication 04-22-2002   Colonoscopy   Family history of malignant neoplasm of gastrointestinal tract    Family history of malignant neoplasm of gastrointestinal tract    GERD (gastroesophageal reflux disease)    Hyperlipemia    Hypertension    Past Surgical History:  Procedure Laterality Date   ABDOMINAL HYSTERECTOMY     BACK SURGERY     BLADDER SURGERY     Bladder Tact   CARPAL TUNNEL RELEASE Bilateral    COLONOSCOPY     FINGER SURGERY Right    Small finger fracture surgery   NECK SURGERY     Family History  Problem Relation Age of Onset   Colon cancer Maternal Grandmother    Arthritis Maternal Grandmother    Alzheimer's disease Mother    Hyperlipidemia Mother    Hypertension Mother    Cancer Father    Alcohol abuse Father    Diabetes Daughter        Type 1   Diabetes Son    Breast cancer Neg Hx    Celiac disease Neg Hx    Cirrhosis Neg Hx    Clotting disorder Neg Hx    Colitis Neg Hx    Colon polyps Neg Hx    Crohn's disease Neg Hx    Cystic fibrosis Neg Hx    Esophageal cancer Neg Hx    Heart disease Neg Hx    Hemochromatosis Neg Hx    Inflammatory bowel disease Neg Hx    Irritable bowel syndrome Neg Hx    Kidney disease Neg Hx    Liver cancer Neg Hx    Liver disease Neg Hx    Ovarian cancer Neg Hx    Pancreatic cancer Neg Hx    Prostate cancer Neg Hx    Rectal cancer Neg Hx    Stomach cancer Neg Hx    Ulcerative colitis Neg Hx    Uterine cancer Neg Hx    Wilson's disease Neg Hx    Social History    Socioeconomic History   Marital status: Married    Spouse name: Marcial Pacas   Number of children: 2   Years of education: 14   Highest education level: Associate degree: occupational, Scientist, product/process development, or vocational program  Occupational History   Occupation: disability    Employer: DISABLED  Tobacco Use   Smoking status: Every Day    Current packs/day: 0.50    Average packs/day: 0.5 packs/day for 10.0 years (5.0 ttl pk-yrs)    Types: Cigarettes  Smokeless tobacco: Never  Vaping Use   Vaping status: Never Used  Substance and Sexual Activity   Alcohol use: No   Drug use: No   Sexual activity: Not on file  Other Topics Concern   Not on file  Social History Narrative   Not on file   Social Determinants of Health   Financial Resource Strain: Low Risk  (05/04/2023)   Overall Financial Resource Strain (CARDIA)    Difficulty of Paying Living Expenses: Not hard at all  Food Insecurity: No Food Insecurity (05/04/2023)   Hunger Vital Sign    Worried About Running Out of Food in the Last Year: Never true    Ran Out of Food in the Last Year: Never true  Transportation Needs: No Transportation Needs (05/04/2023)   PRAPARE - Administrator, Civil Service (Medical): No    Lack of Transportation (Non-Medical): No  Physical Activity: Inactive (05/04/2023)   Exercise Vital Sign    Days of Exercise per Week: 0 days    Minutes of Exercise per Session: 0 min  Stress: No Stress Concern Present (05/04/2023)   [employer] of Occupational Health - Occupational Stress Questionnaire    Feeling of Stress : Not at all  Social Connections: Socially Integrated (05/04/2023)   Social Connection and Isolation Panel [NHANES]    Frequency of Communication with Friends and Family: More than three times a week    Frequency of Social Gatherings with Friends and Family: More than three times a week    Attends Religious Services: More than 4 times per year    Active Member of [employer] or Organizations: Yes     Attends Engineer, structural: More than 4 times per year    Marital Status: Married    Tobacco Counseling Ready to quit: No Counseling given: Not Answered   Clinical Intake:  Pre-visit preparation completed: Yes  Pain : No/denies pain     Nutritional Risks: None Diabetes: Yes CBG done?: No Did pt. bring in CBG monitor from home?: No  How often do you need to have someone help you when you read instructions, pamphlets, or other written materials from your doctor or pharmacy?: 1 - Never  Interpreter Needed?: No  Information entered by :: Renie Ora, LPN   Activities of Daily Living    05/04/2023   10:39 AM  In your present state of health, do you have any difficulty performing the following activities:  Hearing? 0  Vision? 0  Difficulty concentrating or making decisions? 0  Walking or climbing stairs? 0  Dressing or bathing? 0  Doing errands, shopping? 0  Preparing Food and eating ? N  Using the Toilet? N  In the past six months, have you accidently leaked urine? N  Do you have problems with loss of bowel control? N  Managing your Medications? N  Managing your Finances? N  Housekeeping or managing your Housekeeping? N    Patient Care Team: [name], DO as PCP - General (Family Medicine) Bradly Bienenstock, MD as Consulting Physician (Orthopedic Surgery) Roxan Hockey, NP as Nurse Practitioner (Anesthesiology) Ob/Gyn, Christean Leaf, Westley Chandler, MD as Referring Physician (Optometry) [facility] Danella Maiers, [facility] as Pharmacist (Family Medicine)  Indicate any recent Medical Services you may have received from other than [facility] providers in the past year (date may be approximate).     Assessment:   This is a routine wellness examination for Lori Schroeder.  Hearing/Vision screen Vision Screening -  Comments:: Wears rx glasses - up to date with routine eye exams with  Dr.Le  Dietary issues and exercise activities discussed:      Goals Addressed             This Visit's Progress    DIET - EAT MORE FRUITS AND VEGETABLES   On track      Depression Screen    05/04/2023   10:38 AM 11/23/2022    8:05 AM 08/04/2022   10:45 AM 07/17/2022   10:37 AM 05/02/2022   11:06 AM 02/22/2022   11:33 AM 01/27/2022    8:43 AM  PHQ 2/9 Scores  PHQ - 2 Score 0 0 0 0 0 0 0  PHQ- 9 Score  0   0 0 0    Fall Risk    05/04/2023   10:37 AM 11/23/2022    8:05 AM 08/04/2022   10:45 AM 07/17/2022   10:37 AM 05/02/2022   11:08 AM  Fall Risk   Falls in the past year? 0 0 0 0 0  Number falls in past yr: 0    0  Injury with Fall? 0    0  Risk for fall due to : No Fall Risks    No Fall Risks  Follow up Falls prevention discussed    Falls evaluation completed    MEDICARE RISK AT HOME:  Medicare Risk at Home - 05/04/23 1037     Any stairs in or around the home? No    If so, are there any without handrails? No    Home free of loose throw rugs in walkways, pet beds, electrical cords, etc? Yes    Adequate lighting in your home to reduce risk of falls? Yes    Life alert? No    Use of a cane, walker or w/c? No    Grab bars in the bathroom? No    Shower chair or bench in shower? No    Elevated toilet seat or a handicapped toilet? No             TIMED UP AND GO:  Was the test performed?  No    Cognitive Function:    08/01/2021    8:17 AM  MMSE - Mini Mental State Exam  Orientation to time 5  Orientation to Place 5  Registration 3  Attention/ Calculation 5  Recall 3  Language- name 2 objects 2  Language- repeat 1  Language- follow 3 step command 3  Language- read & follow direction 1  Write a sentence 1  Copy design 1  Total score 30        05/04/2023   10:39 AM 05/02/2022   11:10 AM 04/28/2020    1:28 PM 03/19/2019    3:13 PM  6CIT Screen  What Year? 0 points 0 points 0 points 0 points  What month? 0 points 0 points 0 points 0 points  What time? 0 points 0 points 0 points 0 points  Count back from 20 0 points  0 points 0 points 0 points  Months in reverse 0 points 0 points 0 points 0 points  Repeat phrase 0 points 0 points 0 points 0 points  Total Score 0 points 0 points 0 points 0 points    Immunizations Immunization History  Administered Date(s) Administered   Influenza-Unspecified 10/02/2018   Pneumococcal Conjugate-13 05/29/2018   Tdap 05/29/2018    TDAP status: Up to date  Flu Vaccine status: Declined, Education has been provided regarding  the importance of this vaccine but patient still declined. Advised may receive this vaccine at local pharmacy or Health Dept. Aware to provide a copy of the vaccination record if obtained from local pharmacy or Health Dept. Verbalized acceptance and understanding.  Pneumococcal vaccine status: Up to date  Covid-19 vaccine status: Declined, Education has been provided regarding the importance of this vaccine but patient still declined. Advised may receive this vaccine at local pharmacy or Health Dept.or vaccine clinic. Aware to provide a copy of the vaccination record if obtained from local pharmacy or Health Dept. Verbalized acceptance and understanding.  Qualifies for Shingles Vaccine? Yes   Zostavax completed No   Shingrix Completed?: No.    Education has been provided regarding the importance of this vaccine. Patient has been advised to call insurance company to determine out of pocket expense if they have not yet received this vaccine. Advised may also receive vaccine at local pharmacy or Health Dept. Verbalized acceptance and understanding.  Screening Tests Health Maintenance  Topic Date Due   COVID-19 Vaccine (1) Never done   Zoster Vaccines- Shingrix (1 of 2) Never done   PAP SMEAR-Modifier  07/06/2016   FOOT EXAM  11/30/2021   OPHTHALMOLOGY EXAM  05/18/2022   Colonoscopy  11/06/2022   DEXA SCAN  01/28/2023   HEMOGLOBIN A1C  02/02/2023   INFLUENZA VACCINE  05/03/2023   Pneumonia Vaccine 67+ Years old (2 of 2 - PPSV23 or PCV20) 08/05/2023  (Originally 07/24/2018)   Diabetic kidney evaluation - eGFR measurement  09/21/2023 (Originally 08/05/2023)   Diabetic kidney evaluation - Urine ACR  08/05/2023   Medicare Annual Wellness (AWV)  05/03/2024   MAMMOGRAM  12/19/2024   DTaP/Tdap/Td (2 - Td or Tdap) 05/29/2028   Hepatitis C Screening  Completed   HIV Screening  Completed   HPV VACCINES  Aged Out    Health Maintenance  Health Maintenance Due  Topic Date Due   COVID-19 Vaccine (1) Never done   Zoster Vaccines- Shingrix (1 of 2) Never done   PAP SMEAR-Modifier  07/06/2016   FOOT EXAM  11/30/2021   OPHTHALMOLOGY EXAM  05/18/2022   Colonoscopy  11/06/2022   DEXA SCAN  01/28/2023   HEMOGLOBIN A1C  02/02/2023   INFLUENZA VACCINE  05/03/2023    Colorectal cancer screening: Referral to GI placed 05/04/2023. Pt aware the office will call re: appt.  Mammogram status: Completed 12/20/2022. Repeat every year  Bone Density status: Ordered 05/04/2023. Pt provided with contact info and advised to call to schedule appt.  Lung Cancer Screening: (Low Dose CT Chest recommended if Age 2-80 years, 20 pack-year currently smoking OR have quit w/in 15years.) does qualify.   Lung Cancer Screening Referral: referral 05/04/2023  Additional Screening:  Hepatitis C Screening: does not qualify; Completed 02/02/2021  Vision Screening: Recommended annual ophthalmology exams for early detection of glaucoma and other disorders of the eye. Is the patient up to date with their annual eye exam?  Yes  Who is the provider or what is the name of the office in which the patient attends annual eye exams? Dr.le  If pt is not established with a provider, would they like to be referred to a provider to establish care? No .   Dental Screening: Recommended annual dental exams for proper oral hygiene    Community Resource Referral / Chronic Care Management: CRR required this visit?  No   CCM required this visit?  No     Plan:     I have  personally reviewed and noted the following in the patient's chart:   Medical and social history Use of alcohol, tobacco or illicit drugs  Current medications and supplements including opioid prescriptions. Patient is not currently taking opioid prescriptions. Functional ability and status Nutritional status Physical activity Advanced directives List of other physicians Hospitalizations, surgeries, and ER visits in previous 12 months Vitals Screenings to include cognitive, depression, and falls Referrals and appointments  In addition, I have reviewed and discussed with patient certain preventive protocols, quality metrics, and best practice recommendations. A written personalized care plan for preventive services as well as general preventive health recommendations were provided to patient.     Lorrene Reid, LPN   10/10/1476   After Visit Summary: (MyChart) Due to this being a telephonic visit, the after visit summary with patients personalized plan was offered to patient via MyChart   Nurse Notes: Declines any vaccines

## 2023-05-04 NOTE — Patient Instructions (Signed)
Lori Schroeder , Thank you for taking time to come for your Medicare Wellness Visit. I appreciate your ongoing commitment to your health goals. Please review the following plan we discussed and let me know if I can assist you in the future.   Referrals/Orders/Follow-Ups/Clinician Recommendations: Aim for 30 minutes of exercise or brisk walking, 6-8 glasses of water, and 5 servings of fruits and vegetables each day.   This is a list of the screening recommended for you and due dates:  Health Maintenance  Topic Date Due   COVID-19 Vaccine (1) Never done   Zoster (Shingles) Vaccine (1 of 2) Never done   Pap Smear  07/06/2016   Complete foot exam   11/30/2021   Eye exam for diabetics  05/18/2022   Colon Cancer Screening  11/06/2022   DEXA scan (bone density measurement)  01/28/2023   Hemoglobin A1C  02/02/2023   Flu Shot  05/03/2023   Pneumonia Vaccine (2 of 2 - PPSV23 or PCV20) 08/05/2023*   Yearly kidney function blood test for diabetes  09/21/2023*   Yearly kidney health urinalysis for diabetes  08/05/2023   Medicare Annual Wellness Visit  05/03/2024   Mammogram  12/19/2024   DTaP/Tdap/Td vaccine (2 - Td or Tdap) 05/29/2028   Hepatitis C Screening  Completed   HIV Screening  Completed   HPV Vaccine  Aged Out  *Topic was postponed. The date shown is not the original due date.    Advanced directives: (Provided) Advance directive discussed with you today. I have provided a copy for you to complete at home and have notarized. Once this is complete, please bring a copy in to our office so we can scan it into your chart.  Information on Advanced Care Planning can be found at [facility] [place] Advance Health Care Directives Advance Health Care Directives ([email or web address]     If you wish to quit smoking, help is available. For free tobacco cessation program offerings call the [facility] at (404)744-7063 or Live Well Line at 450-322-5475. You may also visit  [email or web address] or email [email or web address] for more information on other programs.   You may also call 1-800-QUIT-NOW (828-529-9290) or visit www.[place].ch or www.[email or web address] for additional resources on smoking cessation.   Next Medicare Annual Wellness Visit scheduled for next year: Yes  Preventive Care 77 Years and Older, Female Preventive care refers to lifestyle choices and visits with your health care provider that can promote health and wellness. What does preventive care include? A yearly physical exam. This is also called an annual well check. Dental exams once or twice a year. Routine eye exams. Ask your health care provider how often you should have your eyes checked. Personal lifestyle choices, including: Daily care of your teeth and gums. Regular physical activity. Eating a healthy diet. Avoiding tobacco and drug use. Limiting alcohol use. Practicing safe sex. Taking low-dose aspirin every day. Taking vitamin and mineral supplements as recommended by your health care provider. What happens during an annual well check? The services and screenings done by your health care provider during your annual well check will depend on your age, overall health, lifestyle risk factors, and family history of disease. Counseling  Your health care provider may ask you questions about your: Alcohol use. Tobacco use. Drug use. Emotional well-being. Home and relationship well-being. Sexual activity. Eating habits. History of falls. Memory and ability to understand (cognition). Work and work Astronomer. Reproductive health. Screening  You may have the following tests or  measurements: Height, weight, and BMI. Blood pressure. Lipid and cholesterol levels. These may be checked every 5 years, or more frequently if you are over 74 years old. Skin check. Lung cancer screening. You may have this screening every year starting at age 28 if you have a 30-pack-year history  of smoking and currently smoke or have quit within the past 15 years. Fecal occult blood test (FOBT) of the stool. You may have this test every year starting at age 16. Flexible sigmoidoscopy or colonoscopy. You may have a sigmoidoscopy every 5 years or a colonoscopy every 10 years starting at age 13. Hepatitis C blood test. Hepatitis B blood test. Sexually transmitted disease (STD) testing. Diabetes screening. This is done by checking your blood sugar (glucose) after you have not eaten for a while (fasting). You may have this done every 1-3 years. Bone density scan. This is done to screen for osteoporosis. You may have this done starting at age 87. Mammogram. This may be done every 1-2 years. Talk to your health care provider about how often you should have regular mammograms. Talk with your health care provider about your test results, treatment options, and if necessary, the need for more tests. Vaccines  Your health care provider may recommend certain vaccines, such as: Influenza vaccine. This is recommended every year. Tetanus, diphtheria, and acellular pertussis (Tdap, Td) vaccine. You may need a Td booster every 10 years. Zoster vaccine. You may need this after age 59. Pneumococcal 13-valent conjugate (PCV13) vaccine. One dose is recommended after age 52. Pneumococcal polysaccharide (PPSV23) vaccine. One dose is recommended after age 92. Talk to your health care provider about which screenings and vaccines you need and how often you need them. This information is not intended to replace advice given to you by your health care provider. Make sure you discuss any questions you have with your health care provider. Document Released: 10/15/2015 Document Revised: 06/07/2016 Document Reviewed: 07/20/2015 Elsevier Interactive Patient Education  2017 [employer].  Fall Prevention in the Home Falls can cause injuries. They can happen to people of all ages. There are many things you can do to  make your home safe and to help prevent falls. What can I do on the outside of my home? Regularly fix the edges of walkways and driveways and fix any cracks. Remove anything that might make you trip as you walk through a door, such as a raised step or threshold. Trim any bushes or trees on the path to your home. Use bright outdoor lighting. Clear any walking paths of anything that might make someone trip, such as rocks or tools. Regularly check to see if handrails are loose or broken. Make sure that both sides of any steps have handrails. Any raised decks and porches should have guardrails on the edges. Have any leaves, snow, or ice cleared regularly. Use sand or salt on walking paths during winter. Clean up any spills in your garage right away. This includes oil or grease spills. What can I do in the bathroom? Use night lights. Install grab bars by the toilet and in the tub and shower. Do not use towel bars as grab bars. Use non-skid mats or decals in the tub or shower. If you need to sit down in the shower, use a plastic, non-slip stool. Keep the floor dry. Clean up any water that spills on the floor as soon as it happens. Remove soap buildup in the tub or shower regularly. Attach bath mats securely with double-sided non-slip  rug tape. Do not have throw rugs and other things on the floor that can make you trip. What can I do in the bedroom? Use night lights. Make sure that you have a light by your bed that is easy to reach. Do not use any sheets or blankets that are too big for your bed. They should not hang down onto the floor. Have a firm chair that has side arms. You can use this for support while you get dressed. Do not have throw rugs and other things on the floor that can make you trip. What can I do in the kitchen? Clean up any spills right away. Avoid walking on wet floors. Keep items that you use a lot in easy-to-reach places. If you need to reach something above you, use a  strong step stool that has a grab bar. Keep electrical cords out of the way. Do not use floor polish or wax that makes floors slippery. If you must use wax, use non-skid floor wax. Do not have throw rugs and other things on the floor that can make you trip. What can I do with my stairs? Do not leave any items on the stairs. Make sure that there are handrails on both sides of the stairs and use them. Fix handrails that are broken or loose. Make sure that handrails are as long as the stairways. Check any carpeting to make sure that it is firmly attached to the stairs. Fix any carpet that is loose or worn. Avoid having throw rugs at the top or bottom of the stairs. If you do have throw rugs, attach them to the floor with carpet tape. Make sure that you have a light switch at the top of the stairs and the bottom of the stairs. If you do not have them, ask someone to add them for you. What else can I do to help prevent falls? Wear shoes that: Do not have high heels. Have rubber bottoms. Are comfortable and fit you well. Are closed at the toe. Do not wear sandals. If you use a stepladder: Make sure that it is fully opened. Do not climb a closed stepladder. Make sure that both sides of the stepladder are locked into place. Ask someone to hold it for you, if possible. Clearly mark and make sure that you can see: Any grab bars or handrails. First and last steps. Where the edge of each step is. Use tools that help you move around (mobility aids) if they are needed. These include: Canes. Walkers. Scooters. Crutches. Turn on the lights when you go into a dark area. Replace any light bulbs as soon as they burn out. Set up your furniture so you have a clear path. Avoid moving your furniture around. If any of your floors are uneven, fix them. If there are any pets around you, be aware of where they are. Review your medicines with your doctor. Some medicines can make you feel dizzy. This can  increase your chance of falling. Ask your doctor what other things that you can do to help prevent falls. This information is not intended to replace advice given to you by your health care provider. Make sure you discuss any questions you have with your health care provider. Document Released: 07/15/2009 Document Revised: 02/24/2016 Document Reviewed: 10/23/2014 Elsevier Interactive Patient Education  2017 [employer].

## 2023-05-04 NOTE — Progress Notes (Signed)
Subjective:   Lori Schroeder is a 67 y.o. female who presents for Medicare Annual (Subsequent) preventive examination.  Visit Complete: Virtual  I connected with  Lori Schroeder on 05/04/23 by a audio enabled telemedicine application and verified that I am speaking with the correct person using two identifiers.  Patient Location: Home  Provider Location: Home Office  I discussed the limitations of evaluation and management by telemedicine. The patient expressed understanding and agreed to proceed.  Patient Medicare AWV questionnaire was completed by the patient on 05/04/2023; I have confirmed that all information answered by patient is correct and no changes since this date.  Review of Systems    Vital Signs: Unable to obtain new vitals due to this being a telehealth visit.  Cardiac Risk Factors include: advanced age (>37men, >55 women);diabetes mellitus;dyslipidemia;hypertension Nutrition Risk Assessment:  Has the patient had any N/V/D within the last 2 months?  No  Does the patient have any non-healing wounds?  No  Has the patient had any unintentional weight loss or weight gain?  No   Diabetes:  Is the patient diabetic?  Yes  If diabetic, was a CBG obtained today?  No  Did the patient bring in their glucometer from home?  No  How often do you monitor your CBG's? Weekly .   Financial Strains and Diabetes Management:  Are you having any financial strains with the device, your supplies or your medication? No .  Does the patient want to be seen by Chronic Care Management for management of their diabetes?  No  Would the patient like to be referred to a Nutritionist or for Diabetic Management?  No   Diabetic Exams:  Diabetic Eye Exam: Completed 06/2022 Diabetic Foot Exam: Overdue, Pt has been advised about the importance in completing this exam. Pt is scheduled for diabetic foot exam on next office visit .     Objective:    Today's Vitals   05/04/23 1036  Weight: 118 lb (53.5  kg)  Height: 4\' 9"  (1.448 m)   Body mass index is 25.53 kg/m.     05/04/2023   10:39 AM 05/02/2022   11:08 AM 04/29/2021    1:31 PM 04/28/2020    1:23 PM 03/19/2019    3:11 PM 01/18/2016    2:39 PM  Advanced Directives  Does Patient Have a Medical Advance Directive? Yes No No No No No  Type of Estate agent of [place];Living will       Copy of Healthcare Power of Attorney in Chart? No - copy requested       Would patient like information on creating a medical advance directive?  No - Patient declined No - Patient declined No - Patient declined Yes (MAU/Ambulatory/Procedural Areas - Information given)     Current Medications (verified) Outpatient Encounter Medications as of 05/04/2023  Medication Sig   albuterol (VENTOLIN HFA) 108 (90 Base) MCG/ACT inhaler INHALE 2 PUFFS INTO LUNGS EVERY 6 HOURS AS NEEDED FOR COUGH & WHEEZING   atorvastatin (LIPITOR) 10 MG tablet Take 1 tablet (10 mg total) by mouth daily.   Budeson-Glycopyrrol-Formoterol (BREZTRI AEROSPHERE) 160-9-4.8 MCG/ACT AERO Inhale 2 puffs into the lungs in the morning and at bedtime.   clindamycin (CLEOCIN-T) 1 % external solution Apply topically 2 (two) times daily. Until clear   cyclobenzaprine (FLEXERIL) 5 MG tablet Take 5 mg by mouth at bedtime as needed.   Dulaglutide (TRULICITY) 1.5 MG/0.5ML SOPN Inject 1.5 mg into the skin once a week. INJECT 1.5MG  ONCE  A WEEK   esomeprazole (NEXIUM) 40 MG capsule Take 1 capsule (40 mg total) by mouth 2 (two) times daily before a meal.   fluticasone (FLONASE) 50 MCG/ACT nasal spray Place 2 sprays into both nostrils daily.   guaiFENesin (MUCINEX) 600 MG 12 hr tablet Take 1 tablet (600 mg total) by mouth 2 (two) times daily.   HYDROcodone-acetaminophen (NORCO/VICODIN) 5-325 MG tablet SMARTSIG:0.5-1 Tablet(s) By Mouth 1-2 Times Daily PRN   Multiple Vitamin (MULTIVITAMIN ADULT PO) Take 1 tablet by mouth daily.   Multiple Vitamins-Minerals (HAIR SKIN AND NAILS FORMULA PO) Take  by mouth.   naloxone (NARCAN) nasal spray 4 mg/0.1 mL See admin instructions.   triamcinolone cream (KENALOG) 0.1 % Apply 1 Application topically 2 (two) times daily.   No facility-administered encounter medications on file as of 05/04/2023.    Allergies (verified) Hydroxychloroquine and Sulfa antibiotics   History: Past Medical History:  Diagnosis Date   Blood transfusion without reported diagnosis    External hemorrhoids without mention of complication 04-22-2002   Colonoscopy   Family history of malignant neoplasm of gastrointestinal tract    Family history of malignant neoplasm of gastrointestinal tract    GERD (gastroesophageal reflux disease)    Hyperlipemia    Hypertension    Past Surgical History:  Procedure Laterality Date   ABDOMINAL HYSTERECTOMY     BACK SURGERY     BLADDER SURGERY     Bladder Tact   CARPAL TUNNEL RELEASE Bilateral    COLONOSCOPY     FINGER SURGERY Right    Small finger fracture surgery   NECK SURGERY     Family History  Problem Relation Age of Onset   Colon cancer Maternal Grandmother    Arthritis Maternal Grandmother    Alzheimer's disease Mother    Hyperlipidemia Mother    Hypertension Mother    Cancer Father    Alcohol abuse Father    Diabetes Daughter        Type 1   Diabetes Son    Breast cancer Neg Hx    Celiac disease Neg Hx    Cirrhosis Neg Hx    Clotting disorder Neg Hx    Colitis Neg Hx    Colon polyps Neg Hx    Crohn's disease Neg Hx    Cystic fibrosis Neg Hx    Esophageal cancer Neg Hx    Heart disease Neg Hx    Hemochromatosis Neg Hx    Inflammatory bowel disease Neg Hx    Irritable bowel syndrome Neg Hx    Kidney disease Neg Hx    Liver cancer Neg Hx    Liver disease Neg Hx    Ovarian cancer Neg Hx    Pancreatic cancer Neg Hx    Prostate cancer Neg Hx    Rectal cancer Neg Hx    Stomach cancer Neg Hx    Ulcerative colitis Neg Hx    Uterine cancer Neg Hx    Wilson's disease Neg Hx    Social History    Socioeconomic History   Marital status: Married    Spouse name: Lori Schroeder   Number of children: 2   Years of education: 14   Highest education level: Associate degree: occupational, Scientist, product/process development, or vocational program  Occupational History   Occupation: disability    Employer: DISABLED  Tobacco Use   Smoking status: Every Day    Current packs/day: 0.50    Average packs/day: 0.5 packs/day for 10.0 years (5.0 ttl pk-yrs)    Types: Cigarettes  Smokeless tobacco: Never  Vaping Use   Vaping status: Never Used  Substance and Sexual Activity   Alcohol use: No   Drug use: No   Sexual activity: Not on file  Other Topics Concern   Not on file  Social History Narrative   Not on file   Social Determinants of Health   Financial Resource Strain: Low Risk  (05/04/2023)   Overall Financial Resource Strain (CARDIA)    Difficulty of Paying Living Expenses: Not hard at all  Food Insecurity: No Food Insecurity (05/04/2023)   Hunger Vital Sign    Worried About Running Out of Food in the Last Year: Never true    Ran Out of Food in the Last Year: Never true  Transportation Needs: No Transportation Needs (05/04/2023)   PRAPARE - Administrator, Civil Service (Medical): No    Lack of Transportation (Non-Medical): No  Physical Activity: Inactive (05/04/2023)   Exercise Vital Sign    Days of Exercise per Week: 0 days    Minutes of Exercise per Session: 0 min  Stress: No Stress Concern Present (05/04/2023)   [employer] of Occupational Health - Occupational Stress Questionnaire    Feeling of Stress : Not at all  Social Connections: Socially Integrated (05/04/2023)   Social Connection and Isolation Panel [NHANES]    Frequency of Communication with Friends and Family: More than three times a week    Frequency of Social Gatherings with Friends and Family: More than three times a week    Attends Religious Services: More than 4 times per year    Active Member of [employer] or Organizations: Yes     Attends Engineer, structural: More than 4 times per year    Marital Status: Married    Tobacco Counseling Ready to quit: No Counseling given: Not Answered   Clinical Intake:  Pre-visit preparation completed: Yes  Pain : No/denies pain     Nutritional Risks: None Diabetes: Yes CBG done?: No Did pt. bring in CBG monitor from home?: No  How often do you need to have someone help you when you read instructions, pamphlets, or other written materials from your doctor or pharmacy?: 1 - Never  Interpreter Needed?: No  Information entered by :: Renie Ora, LPN   Activities of Daily Living    05/04/2023   10:39 AM  In your present state of health, do you have any difficulty performing the following activities:  Hearing? 0  Vision? 0  Difficulty concentrating or making decisions? 0  Walking or climbing stairs? 0  Dressing or bathing? 0  Doing errands, shopping? 0  Preparing Food and eating ? N  Using the Toilet? N  In the past six months, have you accidently leaked urine? N  Do you have problems with loss of bowel control? N  Managing your Medications? N  Managing your Finances? N  Housekeeping or managing your Housekeeping? N    Patient Care Team: [name], DO as PCP - General (Family Medicine) Bradly Bienenstock, MD as Consulting Physician (Orthopedic Surgery) Roxan Hockey, NP as Nurse Practitioner (Anesthesiology) Ob/Gyn, Christean Leaf, Westley Chandler, MD as Referring Physician (Optometry) [facility] Danella Maiers, [facility] as Pharmacist (Family Medicine)  Indicate any recent Medical Services you may have received from other than [facility] providers in the past year (date may be approximate).     Assessment:   This is a routine wellness examination for [facility].  Hearing/Vision screen Vision Screening -  Comments:: Wears rx glasses - up to date with routine eye exams with  Dr.Le  Dietary issues and exercise activities discussed:      Goals Addressed             This Visit's Progress    DIET - EAT MORE FRUITS AND VEGETABLES   On track      Depression Screen    05/04/2023   10:38 AM 11/23/2022    8:05 AM 08/04/2022   10:45 AM 07/17/2022   10:37 AM 05/02/2022   11:06 AM 02/22/2022   11:33 AM 01/27/2022    8:43 AM  PHQ 2/9 Scores  PHQ - 2 Score 0 0 0 0 0 0 0  PHQ- 9 Score  0   0 0 0    Fall Risk    05/04/2023   10:37 AM 11/23/2022    8:05 AM 08/04/2022   10:45 AM 07/17/2022   10:37 AM 05/02/2022   11:08 AM  Fall Risk   Falls in the past year? 0 0 0 0 0  Number falls in past yr: 0    0  Injury with Fall? 0    0  Risk for fall due to : No Fall Risks    No Fall Risks  Follow up Falls prevention discussed    Falls evaluation completed    MEDICARE RISK AT HOME:  Medicare Risk at Home - 05/04/23 1037     Any stairs in or around the home? No    If so, are there any without handrails? No    Home free of loose throw rugs in walkways, pet beds, electrical cords, etc? Yes    Adequate lighting in your home to reduce risk of falls? Yes    Life alert? No    Use of a cane, walker or w/c? No    Grab bars in the bathroom? No    Shower chair or bench in shower? No    Elevated toilet seat or a handicapped toilet? No             TIMED UP AND GO:  Was the test performed?  No    Cognitive Function:    08/01/2021    8:17 AM  MMSE - Mini Mental State Exam  Orientation to time 5  Orientation to Place 5  Registration 3  Attention/ Calculation 5  Recall 3  Language- name 2 objects 2  Language- repeat 1  Language- follow 3 step command 3  Language- read & follow direction 1  Write a sentence 1  Copy design 1  Total score 30        05/04/2023   10:39 AM 05/02/2022   11:10 AM 04/28/2020    1:28 PM 03/19/2019    3:13 PM  6CIT Screen  What Year? 0 points 0 points 0 points 0 points  What month? 0 points 0 points 0 points 0 points  What time? 0 points 0 points 0 points 0 points  Count back from 20 0 points  0 points 0 points 0 points  Months in reverse 0 points 0 points 0 points 0 points  Repeat phrase 0 points 0 points 0 points 0 points  Total Score 0 points 0 points 0 points 0 points    Immunizations Immunization History  Administered Date(s) Administered   Influenza-Unspecified 10/02/2018   Pneumococcal Conjugate-13 05/29/2018   Tdap 05/29/2018    TDAP status: Up to date  Flu Vaccine status: Declined, Education has been provided regarding  the importance of this vaccine but patient still declined. Advised may receive this vaccine at local pharmacy or Health Dept. Aware to provide a copy of the vaccination record if obtained from local pharmacy or Health Dept. Verbalized acceptance and understanding.  Pneumococcal vaccine status: Up to date  Covid-19 vaccine status: Declined, Education has been provided regarding the importance of this vaccine but patient still declined. Advised may receive this vaccine at local pharmacy or Health Dept.or vaccine clinic. Aware to provide a copy of the vaccination record if obtained from local pharmacy or Health Dept. Verbalized acceptance and understanding.  Qualifies for Shingles Vaccine? Yes   Zostavax completed No   Shingrix Completed?: No.    Education has been provided regarding the importance of this vaccine. Patient has been advised to call insurance company to determine out of pocket expense if they have not yet received this vaccine. Advised may also receive vaccine at local pharmacy or Health Dept. Verbalized acceptance and understanding.  Screening Tests Health Maintenance  Topic Date Due   COVID-19 Vaccine (1) Never done   Zoster Vaccines- Shingrix (1 of 2) Never done   PAP SMEAR-Modifier  07/06/2016   FOOT EXAM  11/30/2021   OPHTHALMOLOGY EXAM  05/18/2022   Colonoscopy  11/06/2022   DEXA SCAN  01/28/2023   HEMOGLOBIN A1C  02/02/2023   INFLUENZA VACCINE  05/03/2023   Pneumonia Vaccine 67+ Years old (2 of 2 - PPSV23 or PCV20) 08/05/2023  (Originally 07/24/2018)   Diabetic kidney evaluation - eGFR measurement  09/21/2023 (Originally 08/05/2023)   Diabetic kidney evaluation - Urine ACR  08/05/2023   Medicare Annual Wellness (AWV)  05/03/2024   MAMMOGRAM  12/19/2024   DTaP/Tdap/Td (2 - Td or Tdap) 05/29/2028   Hepatitis C Screening  Completed   HIV Screening  Completed   HPV VACCINES  Aged Out    Health Maintenance  Health Maintenance Due  Topic Date Due   COVID-19 Vaccine (1) Never done   Zoster Vaccines- Shingrix (1 of 2) Never done   PAP SMEAR-Modifier  07/06/2016   FOOT EXAM  11/30/2021   OPHTHALMOLOGY EXAM  05/18/2022   Colonoscopy  11/06/2022   DEXA SCAN  01/28/2023   HEMOGLOBIN A1C  02/02/2023   INFLUENZA VACCINE  05/03/2023    Colorectal cancer screening: Referral to GI placed 05/04/2023. Pt aware the office will call re: appt.  Mammogram status: Completed 12/20/2022. Repeat every year  Bone Density status: Ordered 05/04/2023. Pt provided with contact info and advised to call to schedule appt.  Lung Cancer Screening: (Low Dose CT Chest recommended if Age 2-80 years, 20 pack-year currently smoking OR have quit w/in 15years.) does qualify.   Lung Cancer Screening Referral: referral 05/04/2023  Additional Screening:  Hepatitis C Screening: does not qualify; Completed 02/02/2021  Vision Screening: Recommended annual ophthalmology exams for early detection of glaucoma and other disorders of the eye. Is the patient up to date with their annual eye exam?  Yes  Who is the provider or what is the name of the office in which the patient attends annual eye exams? Dr.le  If pt is not established with a provider, would they like to be referred to a provider to establish care? No .   Dental Screening: Recommended annual dental exams for proper oral hygiene    Community Resource Referral / Chronic Care Management: CRR required this visit?  No   CCM required this visit?  No     Plan:     I have  personally reviewed and noted the following in the patient's chart:   Medical and social history Use of alcohol, tobacco or illicit drugs  Current medications and supplements including opioid prescriptions. Patient is not currently taking opioid prescriptions. Functional ability and status Nutritional status Physical activity Advanced directives List of other physicians Hospitalizations, surgeries, and ER visits in previous 12 months Vitals Screenings to include cognitive, depression, and falls Referrals and appointments  In addition, I have reviewed and discussed with patient certain preventive protocols, quality metrics, and best practice recommendations. A written personalized care plan for preventive services as well as general preventive health recommendations were provided to patient.     Lorrene Reid, LPN   10/10/1476   After Visit Summary: (MyChart) Due to this being a telephonic visit, the after visit summary with patients personalized plan was offered to patient via MyChart   Nurse Notes: Declines any vaccines

## 2023-05-07 ENCOUNTER — Encounter: Payer: Self-pay | Admitting: *Deleted

## 2023-05-09 ENCOUNTER — Ambulatory Visit (INDEPENDENT_AMBULATORY_CARE_PROVIDER_SITE_OTHER): Payer: Medicare HMO

## 2023-05-09 ENCOUNTER — Encounter: Payer: Self-pay | Admitting: Family Medicine

## 2023-05-09 ENCOUNTER — Other Ambulatory Visit: Payer: Medicare HMO

## 2023-05-09 ENCOUNTER — Ambulatory Visit (INDEPENDENT_AMBULATORY_CARE_PROVIDER_SITE_OTHER): Payer: Medicare HMO | Admitting: Family Medicine

## 2023-05-09 VITALS — BP 114/60 | HR 73 | Temp 98.3°F | Ht <= 58 in | Wt 120.0 lb

## 2023-05-09 DIAGNOSIS — E1159 Type 2 diabetes mellitus with other circulatory complications: Secondary | ICD-10-CM

## 2023-05-09 DIAGNOSIS — K219 Gastro-esophageal reflux disease without esophagitis: Secondary | ICD-10-CM

## 2023-05-09 DIAGNOSIS — Z5320 Procedure and treatment not carried out because of patient's decision for unspecified reasons: Secondary | ICD-10-CM

## 2023-05-09 DIAGNOSIS — E119 Type 2 diabetes mellitus without complications: Secondary | ICD-10-CM | POA: Diagnosis not present

## 2023-05-09 DIAGNOSIS — M810 Age-related osteoporosis without current pathological fracture: Secondary | ICD-10-CM | POA: Diagnosis not present

## 2023-05-09 DIAGNOSIS — J410 Simple chronic bronchitis: Secondary | ICD-10-CM | POA: Diagnosis not present

## 2023-05-09 DIAGNOSIS — E785 Hyperlipidemia, unspecified: Secondary | ICD-10-CM

## 2023-05-09 DIAGNOSIS — I152 Hypertension secondary to endocrine disorders: Secondary | ICD-10-CM

## 2023-05-09 DIAGNOSIS — E1169 Type 2 diabetes mellitus with other specified complication: Secondary | ICD-10-CM

## 2023-05-09 DIAGNOSIS — Z130 Encounter for screening for diseases of the blood and blood-forming organs and certain disorders involving the immune mechanism: Secondary | ICD-10-CM

## 2023-05-09 DIAGNOSIS — Z1211 Encounter for screening for malignant neoplasm of colon: Secondary | ICD-10-CM

## 2023-05-09 DIAGNOSIS — Z780 Asymptomatic menopausal state: Secondary | ICD-10-CM

## 2023-05-09 DIAGNOSIS — Z0001 Encounter for general adult medical examination with abnormal findings: Secondary | ICD-10-CM | POA: Diagnosis not present

## 2023-05-09 DIAGNOSIS — Z0000 Encounter for general adult medical examination without abnormal findings: Secondary | ICD-10-CM

## 2023-05-09 LAB — CBC
Hematocrit: 44 % (ref 34.0–46.6)
Hemoglobin: 14.6 g/dL (ref 11.1–15.9)
MCH: 30 pg (ref 26.6–33.0)
MCHC: 33.2 g/dL (ref 31.5–35.7)
MCV: 91 fL (ref 79–97)
Platelets: 460 10*3/uL — ABNORMAL HIGH (ref 150–450)
RBC: 4.86 x10E6/uL (ref 3.77–5.28)
RDW: 13.1 % (ref 11.7–15.4)
WBC: 11.7 10*3/uL — ABNORMAL HIGH (ref 3.4–10.8)

## 2023-05-09 LAB — CMP14+EGFR

## 2023-05-09 LAB — LIPID PANEL

## 2023-05-09 LAB — BAYER DCA HB A1C WAIVED: HB A1C (BAYER DCA - WAIVED): 6.3 % — ABNORMAL HIGH (ref 4.8–5.6)

## 2023-05-09 LAB — TSH

## 2023-05-09 MED ORDER — TRULICITY 1.5 MG/0.5ML ~~LOC~~ SOAJ
1.5000 mg | SUBCUTANEOUS | 3 refills | Status: DC
Start: 2023-05-09 — End: 2023-11-20

## 2023-05-09 MED ORDER — ESOMEPRAZOLE MAGNESIUM 40 MG PO CPDR
40.0000 mg | DELAYED_RELEASE_CAPSULE | Freq: Two times a day (BID) | ORAL | 0 refills | Status: DC
Start: 2023-05-09 — End: 2023-11-20

## 2023-05-09 MED ORDER — ALBUTEROL SULFATE HFA 108 (90 BASE) MCG/ACT IN AERS
2.0000 | INHALATION_SPRAY | Freq: Four times a day (QID) | RESPIRATORY_TRACT | 1 refills | Status: DC | PRN
Start: 2023-05-09 — End: 2024-02-07

## 2023-05-09 NOTE — Patient Instructions (Signed)
Preventive Care 65 Years and Older, Female Preventive care refers to lifestyle choices and visits with your health care provider that can promote health and wellness. Preventive care visits are also called wellness exams. What can I expect for my preventive care visit? Counseling Your health care provider may ask you questions about your: Medical history, including: Past medical problems. Family medical history. Pregnancy and menstrual history. History of falls. Current health, including: Memory and ability to understand (cognition). Emotional well-being. Home life and relationship well-being. Sexual activity and sexual health. Lifestyle, including: Alcohol, nicotine or tobacco, and drug use. Access to firearms. Diet, exercise, and sleep habits. Work and work environment. Sunscreen use. Safety issues such as seatbelt and bike helmet use. Physical exam Your health care provider will check your: Height and weight. These may be used to calculate your BMI (body mass index). BMI is a measurement that tells if you are at a healthy weight. Waist circumference. This measures the distance around your waistline. This measurement also tells if you are at a healthy weight and may help predict your risk of certain diseases, such as type 2 diabetes and high blood pressure. Heart rate and blood pressure. Body temperature. Skin for abnormal spots. What immunizations do I need?  Vaccines are usually given at various ages, according to a schedule. Your health care provider will recommend vaccines for you based on your age, medical history, and lifestyle or other factors, such as travel or where you work. What tests do I need? Screening Your health care provider may recommend screening tests for certain conditions. This may include: Lipid and cholesterol levels. Hepatitis C test. Hepatitis B test. HIV (human immunodeficiency virus) test. STI (sexually transmitted infection) testing, if you are at  risk. Lung cancer screening. Colorectal cancer screening. Diabetes screening. This is done by checking your blood sugar (glucose) after you have not eaten for a while (fasting). Mammogram. Talk with your health care provider about how often you should have regular mammograms. BRCA-related cancer screening. This may be done if you have a family history of breast, ovarian, tubal, or peritoneal cancers. Bone density scan. This is done to screen for osteoporosis. Talk with your health care provider about your test results, treatment options, and if necessary, the need for more tests. Follow these instructions at home: Eating and drinking  Eat a diet that includes fresh fruits and vegetables, whole grains, lean protein, and low-fat dairy products. Limit your intake of foods with high amounts of sugar, saturated fats, and salt. Take vitamin and mineral supplements as recommended by your health care provider. Do not drink alcohol if your health care provider tells you not to drink. If you drink alcohol: Limit how much you have to 0-1 drink a day. Know how much alcohol is in your drink. In the [place] one drink equals one 12 oz bottle of beer (355 mL), one 5 oz glass of wine (148 mL), or one 1 oz glass of hard liquor (44 mL). Lifestyle Brush your teeth every morning and night with fluoride toothpaste. Floss one time each day. Exercise for at least 30 minutes 5 or more days each week. Do not use any products that contain nicotine or tobacco. These products include cigarettes, chewing tobacco, and vaping devices, such as e-cigarettes. If you need help quitting, ask your health care provider. Do not use drugs. If you are sexually active, practice safe sex. Use a condom or other form of protection in order to prevent STIs. Take aspirin only as told by   your health care provider. Make sure that you understand how much to take and what form to take. Work with your health care provider to find out whether it  is safe and beneficial for you to take aspirin daily. Ask your health care provider if you need to take a cholesterol-lowering medicine (statin). Find healthy ways to manage stress, such as: Meditation, yoga, or listening to music. Journaling. Talking to a trusted person. Spending time with friends and family. Minimize exposure to UV radiation to reduce your risk of skin cancer. Safety Always wear your seat belt while driving or riding in a vehicle. Do not drive: If you have been drinking alcohol. Do not ride with someone who has been drinking. When you are tired or distracted. While texting. If you have been using any mind-altering substances or drugs. Wear a helmet and other protective equipment during sports activities. If you have firearms in your house, make sure you follow all gun safety procedures. What's next? Visit your health care provider once a year for an annual wellness visit. Ask your health care provider how often you should have your eyes and teeth checked. Stay up to date on all vaccines. This information is not intended to replace advice given to you by your health care provider. Make sure you discuss any questions you have with your health care provider. Document Revised: 03/16/2021 Document Reviewed: 03/16/2021 Elsevier Patient Education  2024 [employer]  

## 2023-05-09 NOTE — Progress Notes (Signed)
Lori Schroeder is a 67 y.o. female presents to office today for annual physical exam examination.  She is accompanied by her grandson.  Concerns today include: 1. Type 2 Diabetes with hyperlipidemia and diet controlled HTN:  She admits that she eats sweets at least a couple of times per week.  Her blood sugars have been averaging 115 in the morning and postprandial 130s.  She is compliant with her Lipitor, Trulicity.  Last eye exam: needs Last foot exam: needs Last A1c:  Lab Results  Component Value Date   HGBA1C 6.6 (H) 08/04/2022   Nephropathy screen indicated?: 08/2023 Last flu, zoster and/or pneumovax:  Immunization History  Administered Date(s) Administered   Influenza-Unspecified 10/02/2018   Pneumococcal Conjugate-13 05/29/2018   Tdap 05/29/2018    ROS: Denies dizziness, LOC, polyuria, polydipsia, unintended weight loss/gain, foot ulcerations, numbness or tingling in extremities, shortness of breath or chest pain.   Occupation: retired,  Substance use: Smokes 1/2-3/4 of a pack per day.  Denies any hemoptysis, unplanned weight loss, difficulty swallowing, change in breathing.  Has chronic cough.  On Breztri. Health Maintenance Due  Topic Date Due   COVID-19 Vaccine (1) Never done   Zoster Vaccines- Shingrix (1 of 2) Never done   PAP SMEAR-Modifier  07/06/2016   FOOT EXAM  11/30/2021   OPHTHALMOLOGY EXAM  05/18/2022   Colonoscopy  11/06/2022   DEXA SCAN  01/28/2023   HEMOGLOBIN A1C  02/02/2023   INFLUENZA VACCINE  05/03/2023   Refills needed today: all  Immunization History  Administered Date(s) Administered   Influenza-Unspecified 10/02/2018   Pneumococcal Conjugate-13 05/29/2018   Tdap 05/29/2018   Past Medical History:  Diagnosis Date   Blood transfusion without reported diagnosis    External hemorrhoids without mention of complication 04-22-2002   Colonoscopy   Family history of malignant neoplasm of gastrointestinal tract    Family history of malignant  neoplasm of gastrointestinal tract    GERD (gastroesophageal reflux disease)    Hyperlipemia    Hypertension    Social History   Socioeconomic History   Marital status: Married    Spouse name: Marcial Pacas   Number of children: 2   Years of education: 14   Highest education level: Associate degree: occupational, Scientist, product/process development, or vocational program  Occupational History   Occupation: disability    Employer: DISABLED  Tobacco Use   Smoking status: Every Day    Current packs/day: 0.50    Average packs/day: 0.5 packs/day for 10.0 years (5.0 ttl pk-yrs)    Types: Cigarettes   Smokeless tobacco: Never  Vaping Use   Vaping status: Never Used  Substance and Sexual Activity   Alcohol use: No   Drug use: No   Sexual activity: Not on file  Other Topics Concern   Not on file  Social History Narrative   Not on file   Social Determinants of Health   Financial Resource Strain: Low Risk  (05/04/2023)   Overall Financial Resource Strain (CARDIA)    Difficulty of Paying Living Expenses: Not hard at all  Food Insecurity: No Food Insecurity (05/04/2023)   Hunger Vital Sign    Worried About Running Out of Food in the Last Year: Never true    Ran Out of Food in the Last Year: Never true  Transportation Needs: No Transportation Needs (05/04/2023)   PRAPARE - Administrator, Civil Service (Medical): No    Lack of Transportation (Non-Medical): No  Physical Activity: Inactive (05/04/2023)   Exercise Vital Sign  Days of Exercise per Week: 0 days    Minutes of Exercise per Session: 0 min  Stress: No Stress Concern Present (05/04/2023)   [employer] of Occupational Health - Occupational Stress Questionnaire    Feeling of Stress : Not at all  Social Connections: Socially Integrated (05/04/2023)   Social Connection and Isolation Panel [NHANES]    Frequency of Communication with Friends and Family: More than three times a week    Frequency of Social Gatherings with Friends and Family: More  than three times a week    Attends Religious Services: More than 4 times per year    Active Member of [employer] or Organizations: Yes    Attends Engineer, structural: More than 4 times per year    Marital Status: Married  Catering manager Violence: Not At Risk (05/04/2023)   Humiliation, Afraid, Rape, and Kick questionnaire    Fear of Current or Ex-Partner: No    Emotionally Abused: No    Physically Abused: No    Sexually Abused: No   Past Surgical History:  Procedure Laterality Date   ABDOMINAL HYSTERECTOMY     BACK SURGERY     BLADDER SURGERY     Bladder Tact   CARPAL TUNNEL RELEASE Bilateral    COLONOSCOPY     FINGER SURGERY Right    Small finger fracture surgery   NECK SURGERY     Family History  Problem Relation Age of Onset   Colon cancer Maternal Grandmother    Arthritis Maternal Grandmother    Alzheimer's disease Mother    Hyperlipidemia Mother    Hypertension Mother    Cancer Father    Alcohol abuse Father    Diabetes Daughter        Type 1   Diabetes Son    Breast cancer Neg Hx    Celiac disease Neg Hx    Cirrhosis Neg Hx    Clotting disorder Neg Hx    Colitis Neg Hx    Colon polyps Neg Hx    Crohn's disease Neg Hx    Cystic fibrosis Neg Hx    Esophageal cancer Neg Hx    Heart disease Neg Hx    Hemochromatosis Neg Hx    Inflammatory bowel disease Neg Hx    Irritable bowel syndrome Neg Hx    Kidney disease Neg Hx    Liver cancer Neg Hx    Liver disease Neg Hx    Ovarian cancer Neg Hx    Pancreatic cancer Neg Hx    Prostate cancer Neg Hx    Rectal cancer Neg Hx    Stomach cancer Neg Hx    Ulcerative colitis Neg Hx    Uterine cancer Neg Hx    Wilson's disease Neg Hx     Current Outpatient Medications:    albuterol (VENTOLIN HFA) 108 (90 Base) MCG/ACT inhaler, INHALE 2 PUFFS INTO LUNGS EVERY 6 HOURS AS NEEDED FOR COUGH & WHEEZING, Disp: 8.5 g, Rfl: 2   atorvastatin (LIPITOR) 10 MG tablet, Take 1 tablet (10 mg total) by mouth daily., Disp: 90  tablet, Rfl: 3   Budeson-Glycopyrrol-Formoterol (BREZTRI AEROSPHERE) 160-9-4.8 MCG/ACT AERO, Inhale 2 puffs into the lungs in the morning and at bedtime., Disp: 32.1 g, Rfl: 6   clindamycin (CLEOCIN-T) 1 % external solution, Apply topically 2 (two) times daily. Until clear, Disp: 30 mL, Rfl: 12   cyclobenzaprine (FLEXERIL) 5 MG tablet, Take 5 mg by mouth at bedtime as needed., Disp: , Rfl:  Dulaglutide (TRULICITY) 1.5 MG/0.5ML SOPN, Inject 1.5 mg into the skin once a week. INJECT 1.5MG  ONCE A WEEK, Disp: 6 mL, Rfl: 3   esomeprazole (NEXIUM) 40 MG capsule, Take 1 capsule (40 mg total) by mouth 2 (two) times daily before a meal., Disp: 180 capsule, Rfl: 3   fluticasone (FLONASE) 50 MCG/ACT nasal spray, Place 2 sprays into both nostrils daily., Disp: 16 g, Rfl: 6   guaiFENesin (MUCINEX) 600 MG 12 hr tablet, Take 1 tablet (600 mg total) by mouth 2 (two) times daily., Disp: 30 tablet, Rfl: 0   HYDROcodone-acetaminophen (NORCO/VICODIN) 5-325 MG tablet, SMARTSIG:0.5-1 Tablet(s) By Mouth 1-2 Times Daily PRN, Disp: , Rfl:    Multiple Vitamin (MULTIVITAMIN ADULT PO), Take 1 tablet by mouth daily., Disp: , Rfl:    Multiple Vitamins-Minerals (HAIR SKIN AND NAILS FORMULA PO), Take by mouth., Disp: , Rfl:    naloxone (NARCAN) nasal spray 4 mg/0.1 mL, See admin instructions., Disp: , Rfl:    triamcinolone cream (KENALOG) 0.1 %, Apply 1 Application topically 2 (two) times daily., Disp: 30 g, Rfl: 0  Allergies  Allergen Reactions   Hydroxychloroquine Itching and Rash    Diffuse drug rash   Sulfa Antibiotics      ROS: Review of Systems Pertinent items noted in HPI and remainder of comprehensive ROS otherwise negative.    Physical exam BP 114/60   Pulse 73   Temp 98.3 F (36.8 C)   Ht 4\' 9"  (1.448 m)   Wt 120 lb (54.4 kg)   SpO2 95%   BMI 25.97 kg/m  General appearance: alert, cooperative, appears stated age, and no distress Head: Normocephalic, without obvious abnormality, atraumatic Eyes:  negative findings: lids and lashes normal, conjunctivae and sclerae normal, corneas clear, and pupils equal, round, reactive to light and accomodation Ears: normal TM's and external ear canals both ears Nose: Nares normal. Septum midline. Mucosa normal. No drainage or sinus tenderness. Throat:  Oropharynx without masses.  No sublingual masses.  Mucous membranes are moist. Neck: no adenopathy, no carotid bruit, supple, symmetrical, trachea midline, and thyroid not enlarged, symmetric, no tenderness/mass/nodules Back:  Increased kyphosis of the thoracic spine Lungs:  Mild reduction in breath sounds globally but normal work of breathing on room air with no wheezes, rhonchi or rales Heart: regular rate and rhythm, S1, S2 normal, no murmur, click, rub or gallop Abdomen: soft, non-tender; bowel sounds normal; no masses,  no organomegaly Extremities: extremities normal, atraumatic, no cyanosis or edema Pulses: 2+ and symmetric Skin:  Skin is dry on the lower extremities Lymph nodes: Cervical, supraclavicular, and axillary nodes normal. Neurologic: Grossly normal   Diabetic Foot Exam - Simple   Simple Foot Form Diabetic Foot exam was performed with the following findings: Yes 05/09/2023 11:01 AM  Visual Inspection No deformities, no ulcerations, no other skin breakdown bilaterally: Yes Sensation Testing Intact to touch and monofilament testing bilaterally: Yes Pulse Check Posterior Tibialis and Dorsalis pulse intact bilaterally: Yes Comments         05/04/2023   10:38 AM 11/23/2022    8:05 AM 08/04/2022   10:45 AM  Depression screen PHQ 2/9  Decreased Interest 0 0 0  Down, Depressed, Hopeless 0 0 0  PHQ - 2 Score 0 0 0  Altered sleeping  0   Tired, decreased energy  0   Change in appetite  0   Feeling bad or failure about yourself   0   Trouble concentrating  0   Moving slowly or fidgety/restless  0   Suicidal thoughts  0   PHQ-9 Score  0       11/23/2022    8:06 AM 08/04/2022    10:44 AM 07/17/2022   10:37 AM 02/22/2022   11:33 AM  GAD 7 : Generalized Anxiety Score  Nervous, Anxious, on Edge 0 0 0 0  Control/stop worrying 0 0 0 0  Worry too much - different things 0 0 0 0  Trouble relaxing 0 0 0 0  Restless 0 0 0 0  Easily annoyed or irritable 0 0 0 0  Afraid - awful might happen 0 0 0 0  Total GAD 7 Score 0 0 0 0  Anxiety Difficulty  Not difficult at all Not difficult at all Not difficult at all     Assessment/ Plan: Greggory Stallion here for annual physical exam.   Annual physical exam  Cervical cancer screening declined  Colon cancer screening  Controlled type 2 diabetes mellitus without complication, without long-term current use of insulin (HCC) - Plan: Bayer DCA Hb A1c Waived, Microalbumin / creatinine urine ratio, Dulaglutide (TRULICITY) 1.5 MG/0.5ML SOPN  Hyperlipidemia associated with type 2 diabetes mellitus (HCC) - Plan: CMP14+EGFR, Lipid Panel, TSH  Hypertension associated with diabetes (HCC) - Plan: CMP14+EGFR  Screening, anemia, deficiency, iron - Plan: CBC  Simple chronic bronchitis (HCC) - Plan: albuterol (VENTOLIN HFA) 108 (90 Base) MCG/ACT inhaler  Gastroesophageal reflux disease without esophagitis - Plan: esomeprazole (NEXIUM) 40 MG capsule  She will have her cervical cancer screening done with her OB/GYN.  She will return for DEXA scan today.  Declined shingles vaccination.  She has a referral to see her gastroenterologist in place for colon cancer screening  Sugar at goal.  No changes needed.  Continue current regimen.  Check urine microalbumin.  Foot exam performed.  ROI for diabetic eye exam requested  Fasting lipid collected.  Continue statin  Blood pressure is diet controlled.  Albuterol renewed for as needed use.  Continue Breztri  PPI renewed.  Defer further high dose regimen to GI if clinically appropriate  Counseled on healthy lifestyle choices, including diet (rich in fruits, vegetables and lean meats and low in salt  and simple carbohydrates) and exercise (at least 30 minutes of moderate physical activity daily).  Patient to follow up 71m for DM   M. Nadine Counts, DO

## 2023-05-22 ENCOUNTER — Other Ambulatory Visit: Payer: Self-pay | Admitting: *Deleted

## 2023-05-22 DIAGNOSIS — M810 Age-related osteoporosis without current pathological fracture: Secondary | ICD-10-CM

## 2023-05-23 ENCOUNTER — Other Ambulatory Visit: Payer: Medicare HMO

## 2023-05-23 DIAGNOSIS — M810 Age-related osteoporosis without current pathological fracture: Secondary | ICD-10-CM

## 2023-05-24 LAB — VITAMIN D 25 HYDROXY (VIT D DEFICIENCY, FRACTURES): Vit D, 25-Hydroxy: 34 ng/mL (ref 30.0–100.0)

## 2023-06-15 ENCOUNTER — Ambulatory Visit: Payer: Medicare HMO

## 2023-06-19 DIAGNOSIS — M79672 Pain in left foot: Secondary | ICD-10-CM | POA: Diagnosis not present

## 2023-06-19 DIAGNOSIS — D2372 Other benign neoplasm of skin of left lower limb, including hip: Secondary | ICD-10-CM | POA: Diagnosis not present

## 2023-06-20 ENCOUNTER — Ambulatory Visit (INDEPENDENT_AMBULATORY_CARE_PROVIDER_SITE_OTHER): Payer: Medicare HMO | Admitting: *Deleted

## 2023-06-20 DIAGNOSIS — E119 Type 2 diabetes mellitus without complications: Secondary | ICD-10-CM

## 2023-06-20 LAB — HM DIABETES EYE EXAM

## 2023-06-20 NOTE — Progress Notes (Signed)
Lori Schroeder arrived 06/20/2023 and has given verbal consent to obtain images and complete their overdue diabetic retinal screening.  The images have been sent to an ophthalmologist or optometrist for review and interpretation.  Results will be sent back to [name], DO for review.  Patient has been informed they will be contacted when we receive the results via telephone or MyChart

## 2023-07-11 ENCOUNTER — Other Ambulatory Visit: Payer: Self-pay

## 2023-07-11 DIAGNOSIS — Z122 Encounter for screening for malignant neoplasm of respiratory organs: Secondary | ICD-10-CM

## 2023-07-11 DIAGNOSIS — F17210 Nicotine dependence, cigarettes, uncomplicated: Secondary | ICD-10-CM

## 2023-07-11 DIAGNOSIS — Z87891 Personal history of nicotine dependence: Secondary | ICD-10-CM

## 2023-07-18 ENCOUNTER — Encounter: Payer: Self-pay | Admitting: Physician Assistant

## 2023-07-18 ENCOUNTER — Ambulatory Visit: Payer: Medicare HMO | Admitting: Physician Assistant

## 2023-07-18 DIAGNOSIS — F17210 Nicotine dependence, cigarettes, uncomplicated: Secondary | ICD-10-CM

## 2023-07-18 NOTE — Progress Notes (Signed)
Virtual Visit via Telephone Note  I connected with Lori Schroeder on 07/18/23 at  9:21 AM by telephone and verified that I am speaking with the correct person using two identifiers.  Location: Patient: home Provider: working virtually from home   I discussed the limitations, risks, security and privacy concerns of performing an evaluation and management service by telephone and the availability of in person appointments. I also discussed with the patient that there may be a patient responsible charge related to this service. The patient expressed understanding and agreed to proceed.     Shared Decision Making Visit Lung Cancer Screening Program [id number])   Eligibility: Age 67 Pack Years Smoking History Calculation 21 (# packs/per year x # years smoked) Recent History of coughing up blood  No Unexplained weight loss? No ( >Than 15 pounds within the last 6 months ) Prior History Lung / other cancer No (Diagnosis within the last 5 years already requiring surveillance chest CT Scans). Smoking Status Current Smoker  Visit Components: Discussion included one or more decision making aids. Yes Discussion included risk/benefits of screening. Yes Discussion included potential follow up diagnostic testing for abnormal scans. Yes Discussion included meaning and risk of over diagnosis. Yes Discussion included meaning and risk of False Positives. Yes Discussion included meaning of total radiation exposure. Yes  Counseling Included: Importance of adherence to annual lung cancer LDCT screening. Yes Impact of comorbidities on ability to participate in the program. Yes Ability and willingness to under diagnostic treatment: Yes  Smoking Cessation Counseling: Current Smokers:  Discussed importance of smoking cessation. Yes Information about tobacco cessation classes and interventions provided to patient. Yes Symptomatic Patient. No Diagnosis Code: Tobacco Use Z72.0 Asymptomatic Patient  Yes  Counseling (Intermediate counseling: > three minutes counseling) G6440 Information about tobacco cessation classes and interventions provided to patient. Yes Written Order for Lung Cancer Screening with LDCT placed in Epic. Yes (CT Chest Lung Cancer Screening Low Dose W/O CM) [id number] Z12.2-Screening of respiratory organs Z87.891-Personal history of nicotine dependence   I have spent 25 minutes of face to face/ virtual visit  time with the patient discussing the risks and benefits of lung cancer screening. We discussed the above noted topics. We paused at intervals to allow for questions to be asked and answered to ensure understanding.We discussed that the single most powerful action that anyone can take to decrease their risk of developing lung cancer is to quit smoking.  We discussed options for tools to aid in quitting smoking including nicotine replacement therapy, non-nicotine medications, support groups, Quit Smart classes, and behavior modification. We discussed that often times setting smaller, more achievable goals, such as eliminating 1 cigarette a day for a week and then 2 cigarettes a day for a week can be helpful in slowly decreasing the number of cigarettes smoked. I provided  them  with smoking cessation  information  with contact information for community resources, classes, free nicotine replacement therapy, and access to mobile apps, text messaging, and on-line smoking cessation help. I have also provided  them  the office contact information in the event they have any questions. We discussed the time and location of the scan, and that either [name] RN, [name], RN  or I will call / send a letter with the results within 24-72 hours of receiving them. The patient verbalized understanding of all of  the above and had no further questions upon leaving the office. They have my contact information in the event they have any further  questions.  I spent 3 minutes counseling  on smoking cessation and the health risks of continued tobacco abuse.  I explained to the patient that there has been a high incidence of coronary artery disease noted on these exams. I explained that this is a non-gated exam therefore degree or severity cannot be determined. This patient is on statin therapy. I have asked the patient to follow-up with their PCP regarding any incidental finding of coronary artery disease and management with diet or medication as their PCP  feels is clinically indicated. The patient verbalized understanding of the above and had no further questions upon completion of the visit.    Darcella Gasman Dereka Lueras, PA-C

## 2023-07-18 NOTE — Patient Instructions (Signed)
 Thank you for participating in the  Lung Cancer Screening Program. It was our pleasure to meet you today. We will call you with the results of your scan within the next few days. Your scan will be assigned a Lung RADS category score by the physicians reading the scans.  This Lung RADS score determines follow up scanning.  See below for description of categories, and follow up screening recommendations. We will be in touch to schedule your follow up screening annually or based on recommendations of our providers. We will fax a copy of your scan results to your Primary Care Physician, or the physician who referred you to the program, to ensure they have the results. Please call the office if you have any questions or concerns regarding your scanning experience or results.  Our office number is [phone]. Please speak with [name], RN., [name] RN, or Pietro Cassis RN. They are  our Lung Cancer Screening RN.'s If They are unavailable when you call, Please leave a message on the voice mail. We will return your call at our earliest convenience.This voice mail is monitored several times a day.  Remember, if your scan is normal, we will scan you annually as long as you continue to meet the criteria for the program. (Age 66-80, Current smoker or smoker who has quit within the last 15 years). If you are a smoker, remember, quitting is the single most powerful action that you can take to decrease your risk of lung cancer and other pulmonary, breathing related problems. We know quitting is hard, and we are here to help.  Please let us know if there is anything we can do to help you meet your goal of quitting. If you are a former smoker, Counselling psychologist. We are proud of you! Remain smoke free! Remember you can refer friends or family members through the number above.  We will screen them to make sure they meet criteria for the program. Thank you for helping [place] take better care of you  by participating in Lung Screening.   Lung RADS Categories:  Lung RADS 1: no nodules or definitely non-concerning nodules.  Recommendation is for a repeat annual scan in 12 months.  Lung RADS 2:  nodules that are non-concerning in appearance and behavior with a very low likelihood of becoming an active cancer. Recommendation is for a repeat annual scan in 12 months.  Lung RADS 3: nodules that are probably non-concerning , includes nodules with a low likelihood of becoming an active cancer.  Recommendation is for a 40-month repeat screening scan. Often noted after an upper respiratory illness. We will be in touch to make sure you have no questions, and to schedule your 66-month scan.  Lung RADS 4 A: nodules with concerning findings, recommendation is most often for a follow up scan in 3 months or additional testing based on our provider's assessment of the scan. We will be in touch to make sure you have no questions and to schedule the recommended 3 month follow up scan.  Lung RADS 4 B:  indicates findings that are concerning. We will be in touch with you to schedule additional diagnostic testing based on our provider's  assessment of the scan.  You can receive free nicotine replacement therapy ( patches, gum or mints) by calling 1-800-QUIT NOW. Please call so we can get you on the path to becoming  a non-smoker. I know it is hard, but you can do this!  Other options for assistance in  smoking cessation ( As covered by your insurance benefits)  Hypnosis for smoking cessation  [employer] [phone]  Acupuncture for smoking cessation  [employer] [phone]

## 2023-07-23 ENCOUNTER — Ambulatory Visit (HOSPITAL_COMMUNITY)
Admission: RE | Admit: 2023-07-23 | Discharge: 2023-07-23 | Disposition: A | Discharge status: Home / Self Care | Payer: Medicare HMO | Source: Ambulatory Visit | Attending: Family Medicine | Admitting: Family Medicine

## 2023-07-23 DIAGNOSIS — Z87891 Personal history of nicotine dependence: Secondary | ICD-10-CM | POA: Diagnosis not present

## 2023-07-23 DIAGNOSIS — F17210 Nicotine dependence, cigarettes, uncomplicated: Secondary | ICD-10-CM | POA: Insufficient documentation

## 2023-07-23 DIAGNOSIS — Z122 Encounter for screening for malignant neoplasm of respiratory organs: Secondary | ICD-10-CM | POA: Diagnosis not present

## 2023-08-09 ENCOUNTER — Other Ambulatory Visit: Payer: Self-pay | Admitting: Acute Care

## 2023-08-09 DIAGNOSIS — Z122 Encounter for screening for malignant neoplasm of respiratory organs: Secondary | ICD-10-CM

## 2023-08-09 DIAGNOSIS — Z87891 Personal history of nicotine dependence: Secondary | ICD-10-CM

## 2023-08-09 DIAGNOSIS — F17210 Nicotine dependence, cigarettes, uncomplicated: Secondary | ICD-10-CM

## 2023-09-04 ENCOUNTER — Telehealth: Payer: Self-pay | Admitting: Family Medicine

## 2023-09-04 DIAGNOSIS — M5451 Vertebrogenic low back pain: Secondary | ICD-10-CM | POA: Diagnosis not present

## 2023-09-04 NOTE — Telephone Encounter (Signed)
I've received no notes with this information. Please have orthopedics send me the notes and imaging results so I can make a better decision.  If she has osteoporosis, we need a DEXA scan to determine severity, vit d level if she has not had one and a CMP to look at calcium level/ renal function so we can choose appropriate bone medications.

## 2023-09-04 NOTE — Telephone Encounter (Signed)
Copied from CRM [id number]. Topic: Clinical - Medication Question >> Sep 04, 2023  4:10 PM Amy B wrote: Reason for CRM: Patient states her orthopedist advised her to call her PCP to be put on a different medication.  She requests a call back to discuss, [phone].

## 2023-09-04 NOTE — Telephone Encounter (Signed)
Xray showed osteoporosis in her back per patient. They want you to put her on something for osteoporosis. Please advise

## 2023-09-05 NOTE — Telephone Encounter (Signed)
Patient aware and verbalized understanding. Patient aware she is going to have them sent over she had her last Dexa 05/2023

## 2023-10-08 ENCOUNTER — Ambulatory Visit: Payer: Medicare HMO | Admitting: Family Medicine

## 2023-11-07 ENCOUNTER — Encounter (INDEPENDENT_AMBULATORY_CARE_PROVIDER_SITE_OTHER): Payer: Self-pay | Admitting: *Deleted

## 2023-11-20 ENCOUNTER — Ambulatory Visit (INDEPENDENT_AMBULATORY_CARE_PROVIDER_SITE_OTHER): Payer: Medicare HMO | Admitting: Family Medicine

## 2023-11-20 ENCOUNTER — Encounter: Payer: Self-pay | Admitting: Family Medicine

## 2023-11-20 VITALS — BP 139/61 | HR 75 | Temp 98.5°F | Ht <= 58 in | Wt 119.0 lb

## 2023-11-20 DIAGNOSIS — E119 Type 2 diabetes mellitus without complications: Secondary | ICD-10-CM | POA: Diagnosis not present

## 2023-11-20 DIAGNOSIS — K219 Gastro-esophageal reflux disease without esophagitis: Secondary | ICD-10-CM | POA: Diagnosis not present

## 2023-11-20 DIAGNOSIS — Z7985 Long-term (current) use of injectable non-insulin antidiabetic drugs: Secondary | ICD-10-CM

## 2023-11-20 DIAGNOSIS — E1169 Type 2 diabetes mellitus with other specified complication: Secondary | ICD-10-CM | POA: Diagnosis not present

## 2023-11-20 DIAGNOSIS — E1159 Type 2 diabetes mellitus with other circulatory complications: Secondary | ICD-10-CM | POA: Diagnosis not present

## 2023-11-20 DIAGNOSIS — I152 Hypertension secondary to endocrine disorders: Secondary | ICD-10-CM

## 2023-11-20 DIAGNOSIS — E785 Hyperlipidemia, unspecified: Secondary | ICD-10-CM | POA: Diagnosis not present

## 2023-11-20 LAB — BAYER DCA HB A1C WAIVED: HB A1C (BAYER DCA - WAIVED): 6.3 % — ABNORMAL HIGH (ref 4.8–5.6)

## 2023-11-20 MED ORDER — ESOMEPRAZOLE MAGNESIUM 40 MG PO CPDR
40.0000 mg | DELAYED_RELEASE_CAPSULE | Freq: Two times a day (BID) | ORAL | 3 refills | Status: AC
Start: 2023-11-20 — End: ?

## 2023-11-20 MED ORDER — TRULICITY 1.5 MG/0.5ML ~~LOC~~ SOAJ: 1.5000 mg | SUBCUTANEOUS | 3 refills | Status: DC

## 2023-11-20 MED ORDER — ATORVASTATIN CALCIUM 10 MG PO TABS
10.0000 mg | ORAL_TABLET | Freq: Every day | ORAL | 3 refills | Status: DC
Start: 2023-11-20 — End: 2024-02-07

## 2023-11-20 NOTE — Progress Notes (Signed)
Subjective: CC:DM PCP: [name], DO ZOX:WRUEA Lori Schroeder is a 68 y.o. female presenting to clinic today for:  1. Type 2 Diabetes with hypertension, hyperlipidemia:  She reports compliance to all medications.  Reports no hypo or hyperglycemic episodes. Diabetes Health Maintenance Due  Topic Date Due   HEMOGLOBIN A1C  11/09/2023   FOOT EXAM  05/08/2024   OPHTHALMOLOGY EXAM  06/19/2024    Last A1c:  Lab Results  Component Value Date   HGBA1C 6.3 (H) 05/09/2023    ROS: No reports of chest pain, shortness of breath or dizziness.  Overall she has been doing relatively well.   ROS: Per HPI  Allergies  Allergen Reactions   Hydroxychloroquine Itching and Rash    Diffuse drug rash   Sulfa Antibiotics    Past Medical History:  Diagnosis Date   Blood transfusion without reported diagnosis    External hemorrhoids without mention of complication 04-22-2002   Colonoscopy   Family history of malignant neoplasm of gastrointestinal tract    Family history of malignant neoplasm of gastrointestinal tract    GERD (gastroesophageal reflux disease)    Hyperlipemia    Hypertension     Current Outpatient Medications:    albuterol (VENTOLIN HFA) 108 (90 Base) MCG/ACT inhaler, Inhale 2 puffs into the lungs every 6 (six) hours as needed for wheezing or shortness of breath., Disp: 8.5 g, Rfl: 1   atorvastatin (LIPITOR) 10 MG tablet, Take 1 tablet (10 mg total) by mouth daily., Disp: 90 tablet, Rfl: 3   Budeson-Glycopyrrol-Formoterol (BREZTRI AEROSPHERE) 160-9-4.8 MCG/ACT AERO, Inhale 2 puffs into the lungs in the morning and at bedtime., Disp: 32.1 g, Rfl: 6   clindamycin (CLEOCIN-T) 1 % external solution, Apply topically 2 (two) times daily. Until clear, Disp: 30 mL, Rfl: 12   cyclobenzaprine (FLEXERIL) 5 MG tablet, Take 5 mg by mouth at bedtime as needed., Disp: , Rfl:    Dulaglutide (TRULICITY) 1.5 MG/0.5ML SOPN, Inject 1.5 mg into the skin once a week. INJECT 1.5MG  ONCE A WEEK, Disp:  6 mL, Rfl: 3   esomeprazole (NEXIUM) 40 MG capsule, Take 1 capsule (40 mg total) by mouth 2 (two) times daily before a meal., Disp: 180 capsule, Rfl: 0   fluticasone (FLONASE) 50 MCG/ACT nasal spray, Place 2 sprays into both nostrils daily., Disp: 16 g, Rfl: 6   guaiFENesin (MUCINEX) 600 MG 12 hr tablet, Take 1 tablet (600 mg total) by mouth 2 (two) times daily., Disp: 30 tablet, Rfl: 0   HYDROcodone-acetaminophen (NORCO/VICODIN) 5-325 MG tablet, SMARTSIG:0.5-1 Tablet(s) By Mouth 1-2 Times Daily PRN, Disp: , Rfl:    Multiple Vitamin (MULTIVITAMIN ADULT PO), Take 1 tablet by mouth daily., Disp: , Rfl:    Multiple Vitamins-Minerals (HAIR SKIN AND NAILS FORMULA PO), Take by mouth., Disp: , Rfl:    naloxone (NARCAN) nasal spray 4 mg/0.1 mL, See admin instructions., Disp: , Rfl:    triamcinolone cream (KENALOG) 0.1 %, Apply 1 Application topically 2 (two) times daily., Disp: 30 g, Rfl: 0 Social History   Socioeconomic History   Marital status: Married    Spouse name: Marcial Pacas   Number of children: 2   Years of education: 14   Highest education level: Associate degree: occupational, Scientist, product/process development, or vocational program  Occupational History   Occupation: disability    Employer: DISABLED  Tobacco Use   Smoking status: Every Day    Current packs/day: 0.75    Average packs/day: 0.8 packs/day for 28.0 years (21.0 ttl pk-yrs)  Types: Cigarettes   Smokeless tobacco: Never  Vaping Use   Vaping status: Never Used  Substance and Sexual Activity   Alcohol use: No   Drug use: No   Sexual activity: Not on file  Other Topics Concern   Not on file  Social History Narrative   Not on file   Social Drivers of Health   Financial Resource Strain: Low Risk  (05/04/2023)   Overall Financial Resource Strain (CARDIA)    Difficulty of Paying Living Expenses: Not hard at all  Food Insecurity: No Food Insecurity (05/04/2023)   Hunger Vital Sign    Worried About Running Out of Food in the Last Year: Never true     Ran Out of Food in the Last Year: Never true  Transportation Needs: No Transportation Needs (05/04/2023)   PRAPARE - Administrator, Civil Service (Medical): No    Lack of Transportation (Non-Medical): No  Physical Activity: Inactive (05/04/2023)   Exercise Vital Sign    Days of Exercise per Week: 0 days    Minutes of Exercise per Session: 0 min  Stress: No Stress Concern Present (05/04/2023)   [employer] of Occupational Health - Occupational Stress Questionnaire    Feeling of Stress : Not at all  Social Connections: Socially Integrated (05/04/2023)   Social Connection and Isolation Panel [NHANES]    Frequency of Communication with Friends and Family: More than three times a week    Frequency of Social Gatherings with Friends and Family: More than three times a week    Attends Religious Services: More than 4 times per year    Active Member of [employer] or Organizations: Yes    Attends Engineer, structural: More than 4 times per year    Marital Status: Married  Catering manager Violence: Not At Risk (05/04/2023)   Humiliation, Afraid, Rape, and Kick questionnaire    Fear of Current or Ex-Partner: No    Emotionally Abused: No    Physically Abused: No    Sexually Abused: No   Family History  Problem Relation Age of Onset   Colon cancer Maternal Grandmother    Arthritis Maternal Grandmother    Alzheimer's disease Mother    Hyperlipidemia Mother    Hypertension Mother    Cancer Father    Alcohol abuse Father    Diabetes Daughter        Type 1   Diabetes Son    Breast cancer Neg Hx    Celiac disease Neg Hx    Cirrhosis Neg Hx    Clotting disorder Neg Hx    Colitis Neg Hx    Colon polyps Neg Hx    Crohn's disease Neg Hx    Cystic fibrosis Neg Hx    Esophageal cancer Neg Hx    Heart disease Neg Hx    Hemochromatosis Neg Hx    Inflammatory bowel disease Neg Hx    Irritable bowel syndrome Neg Hx    Kidney disease Neg Hx    Liver cancer Neg Hx    Liver  disease Neg Hx    Ovarian cancer Neg Hx    Pancreatic cancer Neg Hx    Prostate cancer Neg Hx    Rectal cancer Neg Hx    Stomach cancer Neg Hx    Ulcerative colitis Neg Hx    Uterine cancer Neg Hx    Wilson's disease Neg Hx     Objective: Office vital signs reviewed. BP 139/61   Pulse 75  Temp 98.5 F (36.9 C)   Ht 4\' 9"  (1.448 m)   Wt 119 lb (54 kg)   SpO2 94%   BMI 25.75 kg/m   Physical Examination:  General: Awake, alert, well nourished, smells of tobacco.  No acute distress HEENT: scler white, MMM Cardio: regular rate and rhythm, S1S2 heard, no murmurs appreciated Pulm: Mild rhonchi noted on the right lung fields.  Otherwise normal work of breathing on room air with no wheezes or rails.  Assessment/ Plan: 68 y.o. female   Diabetes mellitus treated with injections of non-insulin medication (HCC) - Plan: Bayer DCA Hb A1c Waived, Dulaglutide (TRULICITY) 1.5 MG/0.5ML SOAJ  Hypertension associated with diabetes (HCC)  Hyperlipidemia associated with type 2 diabetes mellitus (HCC) - Plan: atorvastatin (LIPITOR) 10 MG tablet  Gastroesophageal reflux disease without esophagitis - Plan: esomeprazole (NEXIUM) 40 MG capsule  Trulicity renewed.  A1c controlled.  Blood pressure well-controlled without medication.  Her statin was renewed though not quite sure she is actually taking the medicine.  PPI renewed.  Use sparingly  [name], DO [facility] [phone]

## 2023-12-05 DIAGNOSIS — H52223 Regular astigmatism, bilateral: Secondary | ICD-10-CM | POA: Diagnosis not present

## 2024-01-28 ENCOUNTER — Other Ambulatory Visit: Payer: Self-pay

## 2024-01-28 DIAGNOSIS — Z1211 Encounter for screening for malignant neoplasm of colon: Secondary | ICD-10-CM

## 2024-01-29 ENCOUNTER — Encounter: Payer: Self-pay | Admitting: *Deleted

## 2024-02-07 ENCOUNTER — Telehealth: Payer: Self-pay | Admitting: *Deleted

## 2024-02-07 NOTE — Telephone Encounter (Signed)
  Procedure: COLONOSCOPY  Estimated body mass index is 25.75 kg/m as calculated from the following:   Height as of 11/20/23: 4\' 9"  (1.448 m).   Weight as of 11/20/23: 119 lb (54 kg).   Have you had a colonoscopy before?  Lb gi   Do you have family history of colon cancer?  YES GRANDFATHER  Do you have a family history of polyps? yes  Previous colonoscopy with polyps removed? NO  Do you have a history colorectal cancer?   NO  Are you diabetic?  YES TYPE 2  Do you have a prosthetic or mechanical heart valve? NO  Do you have a pacemaker/defibrillator?   NO  Have you had endocarditis/atrial fibrillation?  NO  Do you use supplemental oxygen/CPAP?  NO  Have you had joint replacement within the last 12 months?  NO  Do you tend to be constipated or have to use laxatives?  NO   Do you have history of alcohol use? If yes, how much and how often.  NO  Do you have history or are you using drugs? If yes, what do are you  using?  NO  Have you ever had a stroke/heart attack?  NO  Have you ever had a heart or other vascular stent placed,?NO  Do you take weight loss medication? NO  female patients,: have you had a hysterectomy? YES                              are you post menopausal?                                do you still have your menstrual cycle?     Date of last menstrual period?   Do you take any blood-thinning medications such as: (Plavix, aspirin, Coumadin, Aggrenox, Brilinta, Xarelto, Eliquis, Pradaxa, Savaysa or Effient)? NO  If yes we need the name, milligram, dosage and who is prescribing doctor:               Current Outpatient Medications  Medication Sig Dispense Refill   Dulaglutide  (TRULICITY ) 1.5 MG/0.5ML SOAJ Inject 1.5 mg into the skin once a week. INJECT 1.5MG  ONCE A WEEK 6 mL 3   esomeprazole  (NEXIUM ) 40 MG capsule Take 1 capsule (40 mg total) by mouth 2 (two) times daily before a meal. 180 capsule 3   No current facility-administered medications for this  visit.    Allergies  Allergen Reactions   Hydroxychloroquine  Itching and Rash    Diffuse drug rash   Sulfa  Antibiotics

## 2024-02-20 NOTE — Telephone Encounter (Signed)
 ASA 2  Hold trulicity  for 1 week prior

## 2024-02-22 MED ORDER — NA SULFATE-K SULFATE-MG SULF 17.5-3.13-1.6 GM/177ML PO SOLN: 1.0000 | Freq: Once | ORAL | 0 refills | Status: AC

## 2024-02-22 NOTE — Addendum Note (Signed)
 Addended by: Feliz Hosteller on: 02/22/2024 08:32 AM   Modules accepted: Orders

## 2024-02-22 NOTE — Telephone Encounter (Signed)
 Spoke with pt. Scheduled for 6/12. Aware will send instructions to her. Rx for prep to be sent to pharmacy   PA approved via cohere Authorization #[id number]  DOS:  03/13/2024 - 05/13/2024

## 2024-02-23 DIAGNOSIS — H524 Presbyopia: Secondary | ICD-10-CM | POA: Diagnosis not present

## 2024-02-27 ENCOUNTER — Encounter (INDEPENDENT_AMBULATORY_CARE_PROVIDER_SITE_OTHER): Payer: Self-pay | Admitting: *Deleted

## 2024-02-27 NOTE — Telephone Encounter (Signed)
 Referral completed, [facility] apt letter sent to PCP

## 2024-03-12 ENCOUNTER — Telehealth: Payer: Self-pay | Admitting: Pharmacist

## 2024-03-12 NOTE — Telephone Encounter (Signed)
   This patient is appearing on a report for being at risk of failing the adherence measure for diabetes medications this calendar year.   Medication: Trulicity  Last fill date: 02/07/24 for 28 day supply  MyChart message sent to patient. and Discussed barriers to adherence, which included re-enrollment with [facility]   Marvell Slider, PharmD, BCACP, CPP Clinical Pharmacist, [facility]

## 2024-03-13 ENCOUNTER — Ambulatory Visit (HOSPITAL_COMMUNITY)
Admission: RE | Admit: 2024-03-13 | Discharge: 2024-03-13 | Disposition: A | Discharge status: Home / Self Care | Attending: Internal Medicine | Admitting: Internal Medicine

## 2024-03-13 ENCOUNTER — Ambulatory Visit (HOSPITAL_COMMUNITY): Admitting: Anesthesiology

## 2024-03-13 ENCOUNTER — Encounter (HOSPITAL_COMMUNITY): Payer: Self-pay | Admitting: Internal Medicine

## 2024-03-13 ENCOUNTER — Other Ambulatory Visit: Payer: Self-pay

## 2024-03-13 ENCOUNTER — Other Ambulatory Visit: Payer: Self-pay | Admitting: Pharmacist

## 2024-03-13 ENCOUNTER — Encounter (HOSPITAL_COMMUNITY)
Admission: RE | Disposition: A | Discharge status: Home / Self Care | Payer: Self-pay | Source: Home / Self Care | Attending: Internal Medicine

## 2024-03-13 DIAGNOSIS — Z7985 Long-term (current) use of injectable non-insulin antidiabetic drugs: Secondary | ICD-10-CM | POA: Diagnosis not present

## 2024-03-13 DIAGNOSIS — F17210 Nicotine dependence, cigarettes, uncomplicated: Secondary | ICD-10-CM | POA: Insufficient documentation

## 2024-03-13 DIAGNOSIS — K640 First degree hemorrhoids: Secondary | ICD-10-CM

## 2024-03-13 DIAGNOSIS — J449 Chronic obstructive pulmonary disease, unspecified: Secondary | ICD-10-CM | POA: Diagnosis not present

## 2024-03-13 DIAGNOSIS — Z1211 Encounter for screening for malignant neoplasm of colon: Secondary | ICD-10-CM

## 2024-03-13 DIAGNOSIS — K219 Gastro-esophageal reflux disease without esophagitis: Secondary | ICD-10-CM | POA: Diagnosis not present

## 2024-03-13 DIAGNOSIS — E119 Type 2 diabetes mellitus without complications: Secondary | ICD-10-CM | POA: Insufficient documentation

## 2024-03-13 DIAGNOSIS — I10 Essential (primary) hypertension: Secondary | ICD-10-CM | POA: Diagnosis not present

## 2024-03-13 DIAGNOSIS — F17200 Nicotine dependence, unspecified, uncomplicated: Secondary | ICD-10-CM | POA: Diagnosis not present

## 2024-03-13 DIAGNOSIS — Z800 Family history of malignant neoplasm of digestive organs: Secondary | ICD-10-CM | POA: Diagnosis not present

## 2024-03-13 HISTORY — DX: Unspecified osteoarthritis, unspecified site: M19.90

## 2024-03-13 HISTORY — PX: COLONOSCOPY: SHX5424

## 2024-03-13 HISTORY — DX: Chronic obstructive pulmonary disease, unspecified: J44.9

## 2024-03-13 HISTORY — DX: Type 2 diabetes mellitus without complications: E11.9

## 2024-03-13 LAB — GLUCOSE, CAPILLARY: Glucose-Capillary: 89 mg/dL (ref 70–99)

## 2024-03-13 SURGERY — COLONOSCOPY
Anesthesia: General

## 2024-03-13 MED ORDER — PROPOFOL 10 MG/ML IV BOLUS
INTRAVENOUS | Status: DC | PRN
Start: 2024-03-13 — End: 2024-03-13
  Administered 2024-03-13: 75 mg via INTRAVENOUS

## 2024-03-13 MED ORDER — PROPOFOL 500 MG/50ML IV EMUL
INTRAVENOUS | Status: DC | PRN
  Administered 2024-03-13: 150 ug/kg/min via INTRAVENOUS

## 2024-03-13 MED ORDER — LACTATED RINGERS IV SOLN: INTRAVENOUS | Status: DC | PRN

## 2024-03-13 NOTE — Op Note (Signed)
 [facility] Patient Name: Lori Schroeder Procedure Date: 03/13/2024 11:55 AM MRN: [id number] Date of Birth: April 16, 1956 Attending MD: Gemma Kelp , MD, [id number] CSN: [id number] Age: 68 Admit Type: Outpatient Procedure:                Colonoscopy Indications:              Screening for colorectal malignant neoplasm Providers:                Gemma Kelp, MD, Troy Furnish. Hazeline Lister RN, RN,                            Annell Barrow Referring MD:              Medicines:                Propofol per Anesthesia Complications:            No immediate complications. Estimated Blood Loss:     Estimated blood loss: none. Procedure:                Pre-Anesthesia Assessment:                           - Prior to the procedure, a History and Physical                            was performed, and patient medications and                            allergies were reviewed. The patient's tolerance of                            previous anesthesia was also reviewed. The risks                            and benefits of the procedure and the sedation                            options and risks were discussed with the patient.                            All questions were answered, and informed consent                            was obtained. Prior Anticoagulants: The patient has                            taken no anticoagulant or antiplatelet agents. ASA                            Grade Assessment: II - A patient with mild systemic                            disease. After reviewing the risks and benefits,  the patient was deemed in satisfactory condition to                            undergo the procedure.                           After obtaining informed consent, the colonoscope                            was passed under direct vision. Throughout the                            procedure, the patient's blood pressure, pulse, and                             oxygen saturations were monitored continuously. The                            [id number]) scope was introduced through the                            anus and advanced to the the cecum, identified by                            appendiceal orifice and ileocecal valve. The                            colonoscopy was performed without difficulty. The                            patient tolerated the procedure well. The quality                            of the bowel preparation was adequate. The                            ileocecal valve, appendiceal orifice, and rectum                            were photographed. Scope In: 12:49:16 PM Scope Out: 1:02:44 PM Scope Withdrawal Time: 0 hours 6 minutes 31 seconds  Total Procedure Duration: 0 hours 13 minutes 28 seconds  Findings:      The perianal and digital rectal examinations were normal.      Non-bleeding internal hemorrhoids were found during retroflexion. The       hemorrhoids were mild, small and Grade I (internal hemorrhoids that do       not prolapse).      The colon (entire examined portion) appeared normal.      The exam was otherwise without abnormality on direct and retroflexion       views. Impression:               - Non-bleeding internal hemorrhoids.                           - The entire examined  colon is normal.                           - The examination was otherwise normal on direct                            and retroflexion views.                           - No specimens collected. Moderate Sedation:      Moderate (conscious) sedation was personally administered by an       anesthesia professional. The following parameters were monitored: oxygen       saturation, heart rate, blood pressure, respiratory rate, EKG, adequacy       of pulmonary ventilation, and response to care. Recommendation:           - Patient has a contact number available for                            emergencies. The signs and symptoms of  potential                            delayed complications were discussed with the                            patient. Return to normal activities tomorrow.                            Written discharge instructions were provided to the                            patient.                           - Advance diet as tolerated.                           - Continue present medications.                           - Repeat colonoscopy in 10 years for screening                            purposes.                           - Return to GI office PRN. Procedure Code(s):        --- Professional ---                           [phone], Colonoscopy, flexible; diagnostic, including                            collection of specimen(s) by brushing or washing,                            when performed (separate procedure) Diagnosis  Code(s):        --- Professional ---                           Z12.11, Encounter for screening for malignant                            neoplasm of colon                           K64.0, First degree hemorrhoids CPT copyright 2022 American Medical Association. All rights reserved. The codes documented in this report are preliminary and upon coder review may  be revised to meet current compliance requirements. Windsor Hatcher. Dezmond Downie, MD Gemma Kelp, MD 03/13/2024 2:01:11 PM This report has been signed electronically. Number of Addenda: 0

## 2024-03-13 NOTE — Anesthesia Preprocedure Evaluation (Signed)
 Anesthesia Evaluation  Patient identified by MRN, date of birth, ID band Patient awake    Reviewed: Allergy & Precautions, H&P , NPO status , Patient's Chart, lab work & pertinent test results, reviewed documented beta blocker date and time   Airway Mallampati: II  TM Distance: >3 FB Neck ROM: full    Dental no notable dental hx. (+) Dental Advisory Given, Teeth Intact   Pulmonary COPD, Current Smoker   Pulmonary exam normal breath sounds clear to auscultation       Cardiovascular Exercise Tolerance: Good hypertension, Normal cardiovascular exam Rhythm:regular Rate:Normal     Neuro/Psych  Neuromuscular disease  negative psych ROS   GI/Hepatic Neg liver ROS,GERD  ,,  Endo/Other  diabetes, Type 2    Renal/GU negative Renal ROS  negative genitourinary   Musculoskeletal  (+) Arthritis , Osteoarthritis,    Abdominal   Peds  Hematology negative hematology ROS (+)   Anesthesia Other Findings   Reproductive/Obstetrics negative OB ROS                             Anesthesia Physical Anesthesia Plan  ASA: 2  Anesthesia Plan: General   Post-op Pain Management: Minimal or no pain anticipated   Induction: Intravenous  PONV Risk Score and Plan: Propofol infusion  Airway Management Planned: Natural Airway and Nasal Cannula  Additional Equipment: None  Intra-op Plan:   Post-operative Plan:   Informed Consent: I have reviewed the patients History and Physical, chart, labs and discussed the procedure including the risks, benefits and alternatives for the proposed anesthesia with the patient or authorized representative who has indicated his/her understanding and acceptance.     Dental Advisory Given  Plan Discussed with: CRNA  Anesthesia Plan Comments:         Anesthesia Quick Evaluation

## 2024-03-13 NOTE — Discharge Instructions (Signed)
  Colonoscopy Discharge Instructions  Read the instructions outlined below and refer to this sheet in the next few weeks. These discharge instructions provide you with general information on caring for yourself after you leave the hospital. Your doctor may also give you specific instructions. While your treatment has been planned according to the most current medical practices available, unavoidable complications occasionally occur. If you have any problems or questions after discharge, call Dr. Riley Cheadle at [phone]. ACTIVITY You may resume your regular activity, but move at a slower pace for the next 24 hours.  Take frequent rest periods for the next 24 hours.  Walking will help get rid of the air and reduce the bloated feeling in your belly (abdomen).  No driving for 24 hours (because of the medicine (anesthesia) used during the test).   Do not sign any important legal documents or operate any machinery for 24 hours (because of the anesthesia used during the test).  NUTRITION Drink plenty of fluids.  You may resume your normal diet as instructed by your doctor.  Begin with a light meal and progress to your normal diet. Heavy or fried foods are harder to digest and may make you feel sick to your stomach (nauseated).  Avoid alcoholic beverages for 24 hours or as instructed.  MEDICATIONS You may resume your normal medications unless your doctor tells you otherwise.  WHAT YOU CAN EXPECT TODAY Some feelings of bloating in the abdomen.  Passage of more gas than usual.  Spotting of blood in your stool or on the toilet paper.  IF YOU HAD POLYPS REMOVED DURING THE COLONOSCOPY: No aspirin products for 7 days or as instructed.  No alcohol for 7 days or as instructed.  Eat a soft diet for the next 24 hours.  FINDING OUT THE RESULTS OF YOUR TEST Not all test results are available during your visit. If your test results are not back during the visit, make an appointment with your caregiver to find out the  results. Do not assume everything is normal if you have not heard from your caregiver or the medical facility. It is important for you to follow up on all of your test results.  SEEK IMMEDIATE MEDICAL ATTENTION IF: You have more than a spotting of blood in your stool.  Your belly is swollen (abdominal distention).  You are nauseated or vomiting.  You have a temperature over 101.  You have abdominal pain or discomfort that is severe or gets worse throughout the day.         Your colonoscopy was normal.  Your prep was great!         It is recommended you return for 1 more screening colonoscopy in 10 years

## 2024-03-13 NOTE — H&P (Addendum)
 @LOGO @   Primary Care Physician:  Eliodoro Guerin, DO Primary Gastroenterologist:  Dr. Riley Cheadle  Pre-Procedure History & Physical: HPI:  Lori Schroeder is a 68 y.o. female here for  screening colonoscopy.   grandmother with colon cancer at advanced age.  No first-degree relatives with colon cancer. Past Medical History:  Diagnosis Date   Arthritis    Blood transfusion without reported diagnosis    COPD (chronic obstructive pulmonary disease) (HCC)    Diabetes mellitus without complication (HCC)    External hemorrhoids without mention of complication 04/22/2002   Colonoscopy   Family history of malignant neoplasm of gastrointestinal tract    Family history of malignant neoplasm of gastrointestinal tract    GERD (gastroesophageal reflux disease)    Hyperlipemia    Hypertension     Past Surgical History:  Procedure Laterality Date   ABDOMINAL HYSTERECTOMY     BACK SURGERY     BLADDER SURGERY     Bladder Tact   CARPAL TUNNEL RELEASE Bilateral    COLONOSCOPY     FINGER SURGERY Right    Small finger fracture surgery   NECK SURGERY      Prior to Admission medications   Medication Sig Start Date End Date Taking? Authorizing Provider  albuterol  (VENTOLIN  HFA) 108 (90 Base) MCG/ACT inhaler Inhale into the lungs every 6 (six) hours as needed for wheezing or shortness of breath.   Yes [provider]  esomeprazole  (NEXIUM ) 40 MG capsule Take 1 capsule (40 mg total) by mouth 2 (two) times daily before a meal. 11/20/23  Yes Gottschalk, Ashly M, DO  Dulaglutide  (TRULICITY ) 1.5 MG/0.5ML SOAJ Inject 1.5 mg into the skin once a week. INJECT 1.5MG  ONCE A WEEK 11/20/23   Vicky Grange M, DO    Allergies as of 02/22/2024 - Review Complete 02/07/2024  Allergen Reaction Noted   Hydroxychloroquine  Itching and Rash 03/29/2021   Sulfa  antibiotics  04/29/2021    Family History  Problem Relation Age of Onset   Colon cancer Maternal Grandmother    Arthritis Maternal Grandmother     Alzheimer's disease Mother    Hyperlipidemia Mother    Hypertension Mother    Cancer Father    Alcohol abuse Father    Diabetes Daughter        Type 1   Diabetes Son    Breast cancer Neg Hx    Celiac disease Neg Hx    Cirrhosis Neg Hx    Clotting disorder Neg Hx    Colitis Neg Hx    Colon polyps Neg Hx    Crohn's disease Neg Hx    Cystic fibrosis Neg Hx    Esophageal cancer Neg Hx    Heart disease Neg Hx    Hemochromatosis Neg Hx    Inflammatory bowel disease Neg Hx    Irritable bowel syndrome Neg Hx    Kidney disease Neg Hx    Liver cancer Neg Hx    Liver disease Neg Hx    Ovarian cancer Neg Hx    Pancreatic cancer Neg Hx    Prostate cancer Neg Hx    Rectal cancer Neg Hx    Stomach cancer Neg Hx    Ulcerative colitis Neg Hx    Uterine cancer Neg Hx    Wilson's disease Neg Hx     Social History   Socioeconomic History   Marital status: Married    Spouse name: Emeterio Hansen   Number of children: 2   Years of education: 22  Highest education level: Associate degree: occupational, Scientist, product/process development, or vocational program  Occupational History   Occupation: disability    Employer: DISABLED  Tobacco Use   Smoking status: Every Day    Current packs/day: 0.75    Average packs/day: 0.8 packs/day for 28.0 years (21.0 ttl pk-yrs)    Types: Cigarettes   Smokeless tobacco: Never  Vaping Use   Vaping status: Never Used  Substance and Sexual Activity   Alcohol use: No   Drug use: No   Sexual activity: Not on file  Other Topics Concern   Not on file  Social History Narrative   Not on file   Social Drivers of Health   Financial Resource Strain: Low Risk  (05/04/2023)   Overall Financial Resource Strain (CARDIA)    Difficulty of Paying Living Expenses: Not hard at all  Food Insecurity: No Food Insecurity (05/04/2023)   Hunger Vital Sign    Worried About Running Out of Food in the Last Year: Never true    Ran Out of Food in the Last Year: Never true  Transportation Needs: No  Transportation Needs (05/04/2023)   PRAPARE - Administrator, Civil Service (Medical): No    Lack of Transportation (Non-Medical): No  Physical Activity: Inactive (05/04/2023)   Exercise Vital Sign    Days of Exercise per Week: 0 days    Minutes of Exercise per Session: 0 min  Stress: No Stress Concern Present (05/04/2023)   [employer] of Occupational Health - Occupational Stress Questionnaire    Feeling of Stress : Not at all  Social Connections: Socially Integrated (05/04/2023)   Social Connection and Isolation Panel    Frequency of Communication with Friends and Family: More than three times a week    Frequency of Social Gatherings with Friends and Family: More than three times a week    Attends Religious Services: More than 4 times per year    Active Member of [employer] or Organizations: Yes    Attends Engineer, structural: More than 4 times per year    Marital Status: Married  Catering manager Violence: Not At Risk (05/04/2023)   Humiliation, Afraid, Rape, and Kick questionnaire    Fear of Current or Ex-Partner: No    Emotionally Abused: No    Physically Abused: No    Sexually Abused: No    Review of Systems: See HPI, otherwise negative ROS  Physical Exam: BP 124/70   Pulse 93   Temp 98.3 F (36.8 C) (Oral)   Resp 12   Ht 4' 9 (1.448 m)   Wt 51.3 kg   SpO2 97%   BMI 24.45 kg/m  General:   Alert,  Well-developed, well-nourished, pleasant and cooperative in NAD Neck:  Supple; no masses or thyromegaly. No significant cervical adenopathy. Lungs:  Clear throughout to auscultation.   No wheezes, crackles, or rhonchi. No acute distress. Heart:  Regular rate and rhythm; no murmurs, clicks, rubs,  or gallops. Abdomen: Non-distended, normal bowel sounds.  Soft and nontender without appreciable mass or hepatosplenomegaly.   Impression/Plan:    68 year old lady here for screening colonoscopy last 1 appears to be 2008 which was negative.  No bowel symptoms.  She  tells me that her grandmother had colon cancer at advanced age no first-degree relatives.    I have offered the patient a screening colonoscopy today.  The risks, benefits, limitations, alternatives and imponderables have been reviewed with the patient. Questions have been answered. All parties are agreeable.  Notice: This dictation was prepared with Dragon dictation along with smaller phrase technology. Any transcriptional errors that result from this process are unintentional and may not be corrected upon review.

## 2024-03-13 NOTE — Anesthesia Postprocedure Evaluation (Signed)
 Anesthesia Post Note  Patient: Lori Schroeder  Procedure(s) Performed: COLONOSCOPY  Patient location during evaluation: Short Stay Anesthesia Type: General Level of consciousness: awake and alert Pain management: pain level controlled Vital Signs Assessment: post-procedure vital signs reviewed and stable Respiratory status: spontaneous breathing Cardiovascular status: blood pressure returned to baseline and stable Postop Assessment: no apparent nausea or vomiting Anesthetic complications: no   No notable events documented.   Last Vitals:  Vitals:   03/13/24 1307 03/13/24 1311  BP: (!) 98/33 (!) 93/56  Pulse: 77   Resp: 20   Temp: (!) 36.4 C   SpO2: 98%     Last Pain:  Vitals:   03/13/24 1307  TempSrc: Oral  PainSc: 0-No pain                 Gio Janoski

## 2024-03-13 NOTE — Transfer of Care (Addendum)
 Immediate Anesthesia Transfer of Care Note  Patient: Lori Schroeder  Procedure(s) Performed: COLONOSCOPY  Patient Location: PACU and Endoscopy Unit  Anesthesia Type:General  Level of Consciousness: awake  Airway & Oxygen Therapy: Patient Spontanous Breathing  Post-op Assessment: Report given to RN  Post vital signs: Reviewed and stable  Last Vitals:  Vitals Value Taken Time  BP 98/33   Temp 35.4   Pulse 77   Resp 20   SpO2 98     Last Pain:  Vitals:   03/13/24 1247  TempSrc:   PainSc: 3       Patients Stated Pain Goal: 5 (03/13/24 1112)  Complications: No notable events documented.

## 2024-03-13 NOTE — Progress Notes (Signed)
   03/13/2024 Name: Lori Schroeder MRN: [id number] DOB: 09-Nov-1955  T2DM  Patient was previously enrolled in [place] cares patient assistance program for Trulicity .  We will re-enroll patient for medication assistance.  She is stable on current therapy and controlled.     Medication Access/Adherence  Patient reports affordability concerns with their medications: Yes  Patient reports access/transportation concerns to their pharmacy: No  Patient reports adherence concerns with their medications:  No     Lab Results  Component Value Date   HGBA1C 6.3 (H) 11/20/2023    Lab Results  Component Value Date   CREATININE 0.72 05/09/2023   BUN 8 05/09/2023   NA 139 05/09/2023   K 4.5 05/09/2023   CL 99 05/09/2023   CO2 21 05/09/2023     Marvell Slider, PharmD, BCACP, CPP Clinical Pharmacist, [facility]

## 2024-03-14 ENCOUNTER — Encounter (HOSPITAL_COMMUNITY): Payer: Self-pay | Admitting: Internal Medicine

## 2024-03-17 ENCOUNTER — Telehealth: Payer: Self-pay

## 2024-03-17 NOTE — Progress Notes (Signed)
 Pharmacy Medication Assistance Program Note    04/08/2024  Patient ID: Lori Schroeder, female   DOB: 26-Nov-1955, 68 y.o.   MRN: [id number]     03/17/2024  Outreach [employer]  Manufacturer [employer]ication [employer] Retail buyer Drugs Trulicity   Type of Assistance Manufacturer Assistance  Date Application Sent to Patient 03/19/2024  Application Items Requested Application  Date Application Sent to Prescriber 04/08/2024  Date Application Received From Patient 04/08/2024  Application Items Received From Patient Application     Possible renewal?   Emailed RX/ PCP pages to [employer]. 04/08/24

## 2024-04-14 DIAGNOSIS — M5416 Radiculopathy, lumbar region: Secondary | ICD-10-CM | POA: Diagnosis not present

## 2024-04-14 DIAGNOSIS — M961 Postlaminectomy syndrome, not elsewhere classified: Secondary | ICD-10-CM | POA: Diagnosis not present

## 2024-04-14 DIAGNOSIS — Z981 Arthrodesis status: Secondary | ICD-10-CM | POA: Diagnosis not present

## 2024-04-29 NOTE — Telephone Encounter (Signed)
 Let me know if you need me to resend the provider/RX pages!

## 2024-04-29 NOTE — Telephone Encounter (Signed)
 Emailed PCP/RX pages to [employer].

## 2024-05-05 ENCOUNTER — Ambulatory Visit (INDEPENDENT_AMBULATORY_CARE_PROVIDER_SITE_OTHER): Payer: Medicare HMO

## 2024-05-05 VITALS — BP 139/61 | HR 75 | Ht <= 58 in | Wt 119.0 lb

## 2024-05-05 DIAGNOSIS — Z0000 Encounter for general adult medical examination without abnormal findings: Secondary | ICD-10-CM

## 2024-05-05 NOTE — Progress Notes (Signed)
 Subjective:   Lori Schroeder is a 68 y.o. who presents for a Medicare Wellness preventive visit.  As a reminder, Annual Wellness Visits don't include a physical exam, and some assessments may be limited, especially if this visit is performed virtually. We may recommend an in-person follow-up visit with your provider if needed.  Visit Complete: Virtual I connected with  Lori Schroeder on 05/05/24 by a audio enabled telemedicine application and verified that I am speaking with the correct person using two identifiers.  Patient Location: Home  Provider Location: Home Office  I discussed the limitations of evaluation and management by telemedicine. The patient expressed understanding and agreed to proceed.  Vital Signs: Because this visit was a virtual/telehealth visit, some criteria may be missing or patient reported. Any vitals not documented were not able to be obtained and vitals that have been documented are patient reported.  VideoDeclined- This patient declined Librarian, academic. Therefore the visit was completed with audio only.  Persons Participating in Visit: Patient.  AWV Questionnaire: No: Patient Medicare AWV questionnaire was not completed prior to this visit.  Cardiac Risk Factors include: advanced age (>78men, >75 women);dyslipidemia;hypertension;smoking/ tobacco exposure     Objective:    Today's Vitals   05/05/24 1320  BP: 139/61  Pulse: 75  Weight: 119 lb (54 kg)  Height: 4' 9 (1.448 m)   Body mass index is 25.75 kg/m.     05/05/2024    1:23 PM 03/13/2024   11:07 AM 05/04/2023   10:39 AM 05/02/2022   11:08 AM 04/29/2021    1:31 PM 04/28/2020    1:23 PM 03/19/2019    3:11 PM  Advanced Directives  Does Patient Have a Medical Advance Directive? Yes Yes Yes No No No No  Type of Estate agent of [place];Living will Healthcare Power of [place];Living will Healthcare Power of [place];Living will      Copy of Healthcare  Power of Attorney in Chart? No - copy requested  No - copy requested      Would patient like information on creating a medical advance directive?    No - Patient declined No - Patient declined No - Patient declined Yes (MAU/Ambulatory/Procedural Areas - Information given)      Data saved with a previous flowsheet row definition    Current Medications (verified) Outpatient Encounter Medications as of 05/05/2024  Medication Sig   albuterol  (VENTOLIN  HFA) 108 (90 Base) MCG/ACT inhaler Inhale into the lungs every 6 (six) hours as needed for wheezing or shortness of breath.   Dulaglutide  (TRULICITY ) 1.5 MG/0.5ML SOAJ Inject 1.5 mg into the skin once a week. INJECT 1.5MG  ONCE A WEEK   esomeprazole  (NEXIUM ) 40 MG capsule Take 1 capsule (40 mg total) by mouth 2 (two) times daily before a meal.   No facility-administered encounter medications on file as of 05/05/2024.    Allergies (verified) Hydroxychloroquine  and Sulfa  antibiotics   History: Past Medical History:  Diagnosis Date   Arthritis    Blood transfusion without reported diagnosis    COPD (chronic obstructive pulmonary disease) (HCC)    Diabetes mellitus without complication (HCC)    External hemorrhoids without mention of complication 04/22/2002   Colonoscopy   Family history of malignant neoplasm of gastrointestinal tract    Family history of malignant neoplasm of gastrointestinal tract    GERD (gastroesophageal reflux disease)    Hyperlipemia    Hypertension    Past Surgical History:  Procedure Laterality Date   ABDOMINAL HYSTERECTOMY  BACK SURGERY     BLADDER SURGERY     Bladder Tact   CARPAL TUNNEL RELEASE Bilateral    COLONOSCOPY     COLONOSCOPY [name] 03/13/2024   Procedure: COLONOSCOPY;  Surgeon: Shaaron Lamar HERO, MD;  Location: [facility];  Service: Endoscopy;  Laterality: [name];  1245pm, asa 2   FINGER SURGERY Right    Small finger fracture surgery   NECK SURGERY     Family History  Problem Relation Age of Onset    Colon cancer Maternal Grandmother    Arthritis Maternal Grandmother    Alzheimer's disease Mother    Hyperlipidemia Mother    Hypertension Mother    Cancer Father    Alcohol abuse Father    Diabetes Daughter        Type 1   Diabetes Son    Breast cancer Neg Hx    Celiac disease Neg Hx    Cirrhosis Neg Hx    Clotting disorder Neg Hx    Colitis Neg Hx    Colon polyps Neg Hx    Crohn's disease Neg Hx    Cystic fibrosis Neg Hx    Esophageal cancer Neg Hx    Heart disease Neg Hx    Hemochromatosis Neg Hx    Inflammatory bowel disease Neg Hx    Irritable bowel syndrome Neg Hx    Kidney disease Neg Hx    Liver cancer Neg Hx    Liver disease Neg Hx    Ovarian cancer Neg Hx    Pancreatic cancer Neg Hx    Prostate cancer Neg Hx    Rectal cancer Neg Hx    Stomach cancer Neg Hx    Ulcerative colitis Neg Hx    Uterine cancer Neg Hx    Wilson's disease Neg Hx    Social History   Socioeconomic History   Marital status: Married    Spouse name: Timothy   Number of children: 2   Years of education: 14   Highest education level: Associate degree: occupational, Scientist, product/process development, or vocational program  Occupational History   Occupation: disability    Employer: DISABLED  Tobacco Use   Smoking status: Every Day    Current packs/day: 0.75    Average packs/day: 0.8 packs/day for 28.0 years (21.0 ttl pk-yrs)    Types: Cigarettes   Smokeless tobacco: Never  Vaping Use   Vaping status: Never Used  Substance and Sexual Activity   Alcohol use: No   Drug use: No   Sexual activity: Not on file  Other Topics Concern   Not on file  Social History Narrative   Not on file   Social Drivers of Health   Financial Resource Strain: Low Risk  (05/05/2024)   Overall Financial Resource Strain (CARDIA)    Difficulty of Paying Living Expenses: Not hard at all  Food Insecurity: No Food Insecurity (05/05/2024)   Hunger Vital Sign    Worried About Running Out of Food in the Last Year: Never true    Ran  Out of Food in the Last Year: Never true  Transportation Needs: No Transportation Needs (05/05/2024)   PRAPARE - Administrator, Civil Service (Medical): No    Lack of Transportation (Non-Medical): No  Physical Activity: Inactive (05/05/2024)   Exercise Vital Sign    Days of Exercise per Week: 0 days    Minutes of Exercise per Session: 0 min  Stress: No Stress Concern Present (05/05/2024)   [employer] of Occupational Health -  Occupational Stress Questionnaire    Feeling of Stress: Not at all  Social Connections: Moderately Isolated (05/05/2024)   Social Connection and Isolation Panel    Frequency of Communication with Friends and Family: Once a week    Frequency of Social Gatherings with Friends and Family: Once a week    Attends Religious Services: 1 to 4 times per year    Active Member of [employer] or Organizations: No    Attends Engineer, structural: Never    Marital Status: Married    Tobacco Counseling Ready to quit: No Counseling given: Yes    Clinical Intake:  Pre-visit preparation completed: Yes  Pain : No/denies pain     BMI - recorded: 25.75 Nutritional Status: BMI 25 -29 Overweight Nutritional Risks: None Diabetes: Yes  Lab Results  Component Value Date   HGBA1C 6.3 (H) 11/20/2023   HGBA1C 6.3 (H) 05/09/2023   HGBA1C 6.6 (H) 08/04/2022     How often do you need to have someone help you when you read instructions, pamphlets, or other written materials from your doctor or pharmacy?: 1 - Never  Interpreter Needed?: No  Information entered by :: alia t/cma   Activities of Daily Living     05/05/2024    1:23 PM  In your present state of health, do you have any difficulty performing the following activities:  Hearing? 0  Vision? 0  Difficulty concentrating or making decisions? 0  Walking or climbing stairs? 0  Dressing or bathing? 0  Doing errands, shopping? 0  Preparing Food and eating ? N  Using the Toilet? N  In the past six  months, have you accidently leaked urine? N  Do you have problems with loss of bowel control? N  Managing your Medications? N  Managing your Finances? N  Housekeeping or managing your Housekeeping? N    Patient Care Team: Jolinda Norene HERO, DO as PCP - General (Family Medicine) Shari Easter, MD as Consulting Physician (Orthopedic Surgery) Rosalva Eleanor Pina, NP as Nurse Practitioner (Anesthesiology) Ob/Gyn, Landy Posey Daniels, Ross Lacy Cohn, MD as Referring Physician (Optometry) [facility], [facility] as Pharmacist (Family Medicine)  I have updated your Care Teams any recent Medical Services you may have received from other providers in the past year.     Assessment:   This is a routine wellness examination for [facility].  Hearing/Vision screen Hearing Screening - Comments:: Pt denies hearing dif Vision Screening - Comments:: Pt wear glasses/pt [facility] [place]/last ov 2025   Goals Addressed   None    Depression Screen     05/05/2024    1:28 PM 11/20/2023   10:46 AM 05/09/2023    8:46 AM 05/04/2023   10:38 AM 11/23/2022    8:05 AM 08/04/2022   10:45 AM 07/17/2022   10:37 AM  PHQ 2/9 Scores  PHQ - 2 Score 0 0 0 0 0 0 0  PHQ- 9 Score  0 0  0      Fall Risk     11/20/2023   10:46 AM 05/09/2023    8:45 AM 05/04/2023   10:37 AM 11/23/2022    8:05 AM 08/04/2022   10:45 AM  Fall Risk   Falls in the past year? 0 0 0 0 0  Number falls in past yr: 0 0 0    Injury with Fall? 0 0 0    Risk for fall due to : No Fall Risks No Fall Risks No  Fall Risks    Follow up Falls evaluation completed Education provided Falls prevention discussed      MEDICARE RISK AT HOME:  Medicare Risk at Home Any stairs in or around the home?: Yes If so, are there any without handrails?: Yes Home free of loose throw rugs in walkways, pet beds, electrical cords, etc?: Yes Adequate lighting in your home to reduce risk of falls?: Yes Life alert?: No Use of a cane, walker  or w/c?: No Grab bars in the bathroom?: Yes Shower chair or bench in shower?: No Elevated toilet seat or a handicapped toilet?: No  TIMED UP AND GO:  Was the test performed?  no  Cognitive Function: 6CIT completed    08/01/2021    8:17 AM  MMSE - Mini Mental State Exam  Orientation to time 5  Orientation to Place 5  Registration 3  Attention/ Calculation 5  Recall 3  Language- name 2 objects 2  Language- repeat 1  Language- follow 3 step command 3  Language- read & follow direction 1  Write a sentence 1  Copy design 1  Total score 30        05/05/2024    1:24 PM 05/04/2023   10:39 AM 05/02/2022   11:10 AM 04/28/2020    1:28 PM 03/19/2019    3:13 PM  6CIT Screen  What Year? 0 points 0 points 0 points 0 points 0 points  What month? 0 points 0 points 0 points 0 points 0 points  What time? 0 points 0 points 0 points 0 points 0 points  Count back from 20 0 points 0 points 0 points 0 points 0 points  Months in reverse 0 points 0 points 0 points 0 points 0 points  Repeat phrase 0 points 0 points 0 points 0 points 0 points  Total Score 0 points 0 points 0 points 0 points 0 points    Immunizations Immunization History  Administered Date(s) Administered   Influenza-Unspecified 10/02/2018   Pneumococcal Conjugate-13 05/29/2018   Tdap 05/29/2018    Screening Tests Health Maintenance  Topic Date Due   COVID-19 Vaccine (1) Never done   Zoster Vaccines- Shingrix (1 of 2) Never done   INFLUENZA VACCINE  05/02/2024   Diabetic kidney evaluation - eGFR measurement  05/08/2024   Diabetic kidney evaluation - Urine ACR  05/08/2024   Pneumococcal Vaccine: 50+ Years (2 of 2 - PPSV23, PCV20, or PCV21) 11/19/2024 (Originally 07/24/2018)   FOOT EXAM  05/08/2024   HEMOGLOBIN A1C  05/19/2024   OPHTHALMOLOGY EXAM  06/19/2024   Lung Cancer Screening  07/22/2024   MAMMOGRAM  12/19/2024   Medicare Annual Wellness (AWV)  05/05/2025   DEXA SCAN  05/08/2025   DTaP/Tdap/Td (2 - Td or Tdap)  05/29/2028   Colonoscopy  03/13/2034   Hepatitis C Screening  Completed   Hepatitis B Vaccines  Aged Out   HPV VACCINES  Aged Out   Meningococcal B Vaccine  Aged Out    Health Maintenance  Health Maintenance Due  Topic Date Due   COVID-19 Vaccine (1) Never done   Zoster Vaccines- Shingrix (1 of 2) Never done   INFLUENZA VACCINE  05/02/2024   Diabetic kidney evaluation - eGFR measurement  05/08/2024   Diabetic kidney evaluation - Urine ACR  05/08/2024   Health Maintenance Items Addressed: See Nurse Notes at the end of this note  Additional Screening:  Vision Screening: Recommended annual ophthalmology exams for early detection of glaucoma and other disorders of the eye.  Would you like a referral to an eye doctor? No    Dental Screening: Recommended annual dental exams for proper oral hygiene  Community Resource Referral / Chronic Care Management: CRR required this visit?  No   CCM required this visit?  No   Plan:    I have personally reviewed and noted the following in the patient's chart:   Medical and social history Use of alcohol, tobacco or illicit drugs  Current medications and supplements including opioid prescriptions. Patient is not currently taking opioid prescriptions. Functional ability and status Nutritional status Physical activity Advanced directives List of other physicians Hospitalizations, surgeries, and ER visits in previous 12 months Vitals Screenings to include cognitive, depression, and falls Referrals and appointments  In addition, I have reviewed and discussed with patient certain preventive protocols, quality metrics, and best practice recommendations. A written personalized care plan for preventive services as well as general preventive health recommendations were provided to patient.   Ozie Ned, CMA   05/05/2024   After Visit Summary: (MyChart) Due to this being a telephonic visit, the after visit summary with patients personalized  plan was offered to patient via MyChart   Notes: PCP Follow Up Recommendations: Pt is aware  and due for the following: UrinsACR/eGFR/Flu/Shingles vaccine- per pt don't any vaccines

## 2024-05-05 NOTE — Patient Instructions (Signed)
 Ms. Laubach , Thank you for taking time out of your busy schedule to complete your Annual Wellness Visit with me. I enjoyed our conversation and look forward to speaking with you again next year. I, as well as your care team,  appreciate your ongoing commitment to your health goals. Please review the following plan we discussed and let me know if I can assist you in the future. Your Game plan/ To Do List    Referrals: If you haven't heard from the office you've been referred to, please reach out to them at the phone provided.   Follow up Visits: We will see or speak with you next year for your Next Medicare AWV with our clinical staff on 05/06/25 at 1:10p.m. Have you seen your provider in the last 6 months (3 months if uncontrolled diabetes)? Yes  Clinician Recommendations:  Aim for 30 minutes of exercise or brisk walking, 6-8 glasses of water, and 5 servings of fruits and vegetables each day.       This is a list of the screenings recommended for you:  Health Maintenance  Topic Date Due   COVID-19 Vaccine (1) Never done   Zoster (Shingles) Vaccine (1 of 2) Never done   Medicare Annual Wellness Visit  05/03/2024   Flu Shot  05/02/2024   Yearly kidney function blood test for diabetes  05/08/2024   Yearly kidney health urinalysis for diabetes  05/08/2024   Pneumococcal Vaccine for age over 76 (2 of 2 - PPSV23, PCV20, or PCV21) 11/19/2024*   Complete foot exam   05/08/2024   Hemoglobin A1C  05/19/2024   Eye exam for diabetics  06/19/2024   Screening for Lung Cancer  07/22/2024   Mammogram  12/19/2024   DEXA scan (bone density measurement)  05/08/2025   DTaP/Tdap/Td vaccine (2 - Td or Tdap) 05/29/2028   Colon Cancer Screening  03/13/2034   Hepatitis C Screening  Completed   Hepatitis B Vaccine  Aged Out   HPV Vaccine  Aged Out   Meningitis B Vaccine  Aged Out  *Topic was postponed. The date shown is not the original due date.    Advanced directives: (Declined) Advance directive  discussed with you today. Even though you declined this today, please call our office should you change your mind, and we can give you the proper paperwork for you to fill out. Advance Care Planning is important because it:  [x]  Makes sure you receive the medical care that is consistent with your values, goals, and preferences  [x]  It provides guidance to your family and loved ones and reduces their decisional burden about whether or not they are making the right decisions based on your wishes.  Follow the link provided in your after visit summary or read over the paperwork we have mailed to you to help you started getting your Advance Directives in place. If you need assistance in completing these, please reach out to us  so that we can help you!  See attachments for Preventive Care and Fall Prevention Tips.

## 2024-06-03 ENCOUNTER — Ambulatory Visit (INDEPENDENT_AMBULATORY_CARE_PROVIDER_SITE_OTHER): Admitting: Family Medicine

## 2024-06-03 ENCOUNTER — Encounter: Payer: Self-pay | Admitting: Family Medicine

## 2024-06-03 VITALS — BP 137/79 | HR 80 | Temp 97.5°F | Ht <= 58 in | Wt 120.1 lb

## 2024-06-03 DIAGNOSIS — E119 Type 2 diabetes mellitus without complications: Secondary | ICD-10-CM

## 2024-06-03 DIAGNOSIS — Z716 Tobacco abuse counseling: Secondary | ICD-10-CM

## 2024-06-03 DIAGNOSIS — H538 Other visual disturbances: Secondary | ICD-10-CM

## 2024-06-03 DIAGNOSIS — Z7985 Long-term (current) use of injectable non-insulin antidiabetic drugs: Secondary | ICD-10-CM

## 2024-06-03 DIAGNOSIS — J410 Simple chronic bronchitis: Secondary | ICD-10-CM

## 2024-06-03 DIAGNOSIS — F17200 Nicotine dependence, unspecified, uncomplicated: Secondary | ICD-10-CM | POA: Diagnosis not present

## 2024-06-03 DIAGNOSIS — R94111 Abnormal electroretinogram [ERG]: Secondary | ICD-10-CM | POA: Diagnosis not present

## 2024-06-03 MED ORDER — NICOTINE 14 MG/24HR TD PT24: 14.0000 mg | MEDICATED_PATCH | Freq: Every day | TRANSDERMAL | 0 refills | Status: AC

## 2024-06-03 MED ORDER — NICOTINE 21 MG/24HR TD PT24: 21.0000 mg | MEDICATED_PATCH | Freq: Every day | TRANSDERMAL | 0 refills | Status: AC

## 2024-06-03 MED ORDER — NICOTINE 7 MG/24HR TD PT24: 7.0000 mg | MEDICATED_PATCH | Freq: Every day | TRANSDERMAL | 0 refills | Status: AC

## 2024-06-03 NOTE — Progress Notes (Signed)
 Subjective: CC: blurred vision PCP: Jolinda Lori HERO, DO YEP:Lori Schroeder is a 68 y.o. female presenting to clinic today for:  Discussed the use of AI scribe software for clinical note transcription with the patient, who gave verbal consent to proceed.  History of Present Illness   Lori Schroeder is a 68 year old female with diabetes who presents with worsening blurry vision and eye discomfort.  She has been experiencing blurry vision, itching, and occasional pain in her eyes for approximately six months, with a notable worsening in the past couple of weeks. The left eye is worse than the right. No flashes of light, starbursts, or black spots in her vision.  She has a history of diabetes and is concerned that her vision issues may be related to this condition. She received new glasses three months ago but continues to experience symptoms. A previous eye examination suggested a possible retinal issue, as the retina appeared 'unattached,' but no definitive diagnosis of diabetic damage was made.  Her blood pressure at home is around 138/74, and she is on medication for hypertension. She checks her blood pressure regularly at home.     Interested in smoking cessation. Previously tried Chantix  but made her feel weird so discontinued.  Looking at alternatives.  Currently smoking 1ppd. No hemoptysis or unplanned weight loss but does occasionally have some shortness of breath.  Known COPD, previously treated with Breztri , which she used to get from PAP with [facility].  Hasn't used consistently because hasn't been symptomatic.  Not using/ requiring Albuterol  regularly either.  ROS: Per HPI  Allergies  Allergen Reactions   Hydroxychloroquine  Itching and Rash    Diffuse drug rash   Sulfa  Antibiotics    Past Medical History:  Diagnosis Date   Arthritis    Blood transfusion without reported diagnosis    COPD (chronic obstructive pulmonary disease) (HCC)    Diabetes mellitus without complication  (HCC)    External hemorrhoids without mention of complication 04/22/2002   Colonoscopy   Family history of malignant neoplasm of gastrointestinal tract    Family history of malignant neoplasm of gastrointestinal tract    GERD (gastroesophageal reflux disease)    Hyperlipemia    Hypertension     Current Outpatient Medications:    albuterol  (VENTOLIN  HFA) 108 (90 Base) MCG/ACT inhaler, Inhale into the lungs every 6 (six) hours as needed for wheezing or shortness of breath., Disp: , Rfl:    Dulaglutide  (TRULICITY ) 1.5 MG/0.5ML SOAJ, Inject 1.5 mg into the skin once a week. INJECT 1.5MG  ONCE A WEEK, Disp: 6 mL, Rfl: 3   esomeprazole  (NEXIUM ) 40 MG capsule, Take 1 capsule (40 mg total) by mouth 2 (two) times daily before a meal., Disp: 180 capsule, Rfl: 3 Social History   Socioeconomic History   Marital status: Married    Spouse name: Lori Schroeder   Number of children: 2   Years of education: 14   Highest education level: Associate degree: occupational, Scientist, product/process development, or vocational program  Occupational History   Occupation: disability    Employer: DISABLED  Tobacco Use   Smoking status: Every Day    Current packs/day: 0.75    Average packs/day: 0.8 packs/day for 28.0 years (21.0 ttl pk-yrs)    Types: Cigarettes   Smokeless tobacco: Never  Vaping Use   Vaping status: Never Used  Substance and Sexual Activity   Alcohol use: No   Drug use: No   Sexual activity: Not on file  Other Topics Concern   Not on file  Social History Narrative   Not on file   Social Drivers of Health   Financial Resource Strain: Low Risk  (05/05/2024)   Overall Financial Resource Strain (CARDIA)    Difficulty of Paying Living Expenses: Not hard at all  Food Insecurity: No Food Insecurity (05/05/2024)   Hunger Vital Sign    Worried About Running Out of Food in the Last Year: Never true    Ran Out of Food in the Last Year: Never true  Transportation Needs: No Transportation Needs (05/05/2024)   PRAPARE -  Administrator, Civil Service (Medical): No    Lack of Transportation (Non-Medical): No  Physical Activity: Inactive (05/05/2024)   Exercise Vital Sign    Days of Exercise per Week: 0 days    Minutes of Exercise per Session: 0 min  Stress: No Stress Concern Present (05/05/2024)   [employer] of Occupational Health - Occupational Stress Questionnaire    Feeling of Stress: Not at all  Social Connections: Moderately Isolated (05/05/2024)   Social Connection and Isolation Panel    Frequency of Communication with Friends and Family: Once a week    Frequency of Social Gatherings with Friends and Family: Once a week    Attends Religious Services: 1 to 4 times per year    Active Member of [employer] or Organizations: No    Attends Banker Meetings: Never    Marital Status: Married  Catering manager Violence: Not At Risk (05/05/2024)   Humiliation, Afraid, Rape, and Kick questionnaire    Fear of Current or Ex-Partner: No    Emotionally Abused: No    Physically Abused: No    Sexually Abused: No   Family History  Problem Relation Age of Onset   Colon cancer Maternal Grandmother    Arthritis Maternal Grandmother    Alzheimer's disease Mother    Hyperlipidemia Mother    Hypertension Mother    Cancer Father    Alcohol abuse Father    Diabetes Daughter        Type 1   Diabetes Son    Breast cancer Neg Hx    Celiac disease Neg Hx    Cirrhosis Neg Hx    Clotting disorder Neg Hx    Colitis Neg Hx    Colon polyps Neg Hx    Crohn's disease Neg Hx    Cystic fibrosis Neg Hx    Esophageal cancer Neg Hx    Heart disease Neg Hx    Hemochromatosis Neg Hx    Inflammatory bowel disease Neg Hx    Irritable bowel syndrome Neg Hx    Kidney disease Neg Hx    Liver cancer Neg Hx    Liver disease Neg Hx    Ovarian cancer Neg Hx    Pancreatic cancer Neg Hx    Prostate cancer Neg Hx    Rectal cancer Neg Hx    Stomach cancer Neg Hx    Ulcerative colitis Neg Hx    Uterine cancer  Neg Hx    Wilson's disease Neg Hx     Objective: Office vital signs reviewed. BP 137/79   Pulse 80   Temp (!) 97.5 F (36.4 C)   Ht 4' 9 (1.448 m)   Wt 120 lb 2 oz (54.5 kg)   SpO2 96%   BMI 25.99 kg/m   Physical Examination:  General: Awake, alert, well nourished, No acute distress HEENT: sclera white, MMM, PERRL, EOMI, no conjunctival injection/ foreign bodies or abnormalities  Cardio: regular  rate and rhythm, S1S2 heard, no murmurs appreciated Pulm: clear to auscultation bilaterally, no wheezes, rhonchi or rales; normal work of breathing on room air    Assessment/ Plan: 68 y.o. female   Blurry vision - Plan: Ambulatory referral to Ophthalmology  Retinal function studies abnormal - Plan: Ambulatory referral to Ophthalmology  Tobacco use disorder - Plan: nicotine  (NICODERM CQ  - DOSED IN MG/24 HOURS) 21 mg/24hr patch, nicotine  (NICODERM CQ  - DOSED IN MG/24 HOURS) 14 mg/24hr patch, nicotine  (NICODERM CQ  - DOSED IN MG/24 HR) 7 mg/24hr patch  Encounter for smoking cessation counseling - Plan: nicotine  (NICODERM CQ  - DOSED IN MG/24 HOURS) 21 mg/24hr patch, nicotine  (NICODERM CQ  - DOSED IN MG/24 HOURS) 14 mg/24hr patch, nicotine  (NICODERM CQ  - DOSED IN MG/24 HR) 7 mg/24hr patch  Simple chronic bronchitis (HCC) - Plan: AMB Referral [facility] Care Management  Diabetes mellitus treated with injections of non-insulin medication (HCC) - Plan: AMB Referral [facility] Care Management  Assessment and Plan    Visual disturbance Visual disturbance for six months, worsening recently. Symptoms include blurriness, itching, running, and occasional pain, mainly in the left eye. Differential includes chronic dry eye and potential diabetic retinopathy. No signs of retinal detachment or glaucoma. - Refer to ophthalmologist specializing in retinal conditions for urgent evaluation. - Advise use of over-the-counter lubricating eye drops regularly.  Hypertension Blood pressure at home approximately 138/74  mmHg. Possible white coat hypertension noted. Emphasized importance of monitoring due to potential impact on vision. - Continue monitoring blood pressure at home.     Tobacco use d/o; COPD - Referral to clinical pharmacy for Breztri  and Trulicity  (didn't discuss DM but needs PAP) -Resume home Breztri  - Nicotine  patches, discussed removal before bedtime and reapplication each morning - Failed Chantix  due to intolerance.  Lori CHRISTELLA Fielding, DO [facility] [phone]

## 2024-06-09 ENCOUNTER — Encounter (INDEPENDENT_AMBULATORY_CARE_PROVIDER_SITE_OTHER): Admitting: Ophthalmology

## 2024-06-13 ENCOUNTER — Ambulatory Visit (INDEPENDENT_AMBULATORY_CARE_PROVIDER_SITE_OTHER): Admitting: Ophthalmology

## 2024-06-13 ENCOUNTER — Encounter (INDEPENDENT_AMBULATORY_CARE_PROVIDER_SITE_OTHER): Payer: Self-pay | Admitting: Ophthalmology

## 2024-06-13 DIAGNOSIS — H04123 Dry eye syndrome of bilateral lacrimal glands: Secondary | ICD-10-CM | POA: Diagnosis not present

## 2024-06-13 DIAGNOSIS — H25813 Combined forms of age-related cataract, bilateral: Secondary | ICD-10-CM | POA: Diagnosis not present

## 2024-06-13 DIAGNOSIS — E119 Type 2 diabetes mellitus without complications: Secondary | ICD-10-CM | POA: Diagnosis not present

## 2024-06-13 DIAGNOSIS — Z7985 Long-term (current) use of injectable non-insulin antidiabetic drugs: Secondary | ICD-10-CM | POA: Diagnosis not present

## 2024-06-13 DIAGNOSIS — H43821 Vitreomacular adhesion, right eye: Secondary | ICD-10-CM | POA: Diagnosis not present

## 2024-06-13 DIAGNOSIS — I10 Essential (primary) hypertension: Secondary | ICD-10-CM | POA: Diagnosis not present

## 2024-06-13 DIAGNOSIS — H35033 Hypertensive retinopathy, bilateral: Secondary | ICD-10-CM | POA: Diagnosis not present

## 2024-06-13 NOTE — Progress Notes (Addendum)
 [facility] - Clinic Note  06/13/2024   CHIEF COMPLAINT Patient presents for Retina Evaluation  HISTORY OF PRESENT ILLNESS: Lori Schroeder is a 68 y.o. female who presents to the clinic today for:  HPI     Retina Evaluation   I, the attending physician,  performed the HPI with the patient and updated documentation appropriately.        Comments   Retina Eval per Dr.Gottschalk Patient has had Diabetes for 5 years. Patient complains of blurred vision distant,near and driving. Patient slao goes to [facility] eye Doctor in [place]. Blood sugar 120 A1C 6.7      Last edited by Valdemar Rogue, MD on 06/20/2024  7:14 PM.     Patient feels the vision is deceasing in the right eye. The vision in the right eye is blurry even with new glasses.   Referring physician: Jolinda Norene HERO, DO [address] [place],  [place] [address]  HISTORICAL INFORMATION:  Selected notes from the MEDICAL RECORD NUMBER Referred by Dr. Jolinda for DM exam anddecreased vision OD LEE:  Ocular Hx- PMH-   CURRENT MEDICATIONS: No current outpatient medications on file. (Ophthalmic Drugs)   No current facility-administered medications for this visit. (Ophthalmic Drugs)   Current Outpatient Medications (Other)  Medication Sig   albuterol  (VENTOLIN  HFA) 108 (90 Base) MCG/ACT inhaler Inhale into the lungs every 6 (six) hours as needed for wheezing or shortness of breath.   budesonide-glycopyrrolate-formoterol (BREZTRI  AEROSPHERE) 160-9-4.8 MCG/ACT AERO inhaler Inhale 2 puffs into the lungs 2 (two) times daily.   Dulaglutide  (TRULICITY ) 1.5 MG/0.5ML SOAJ Inject 1.5 mg into the skin once a week. INJECT 1.5MG  ONCE A WEEK   esomeprazole  (NEXIUM ) 40 MG capsule Take 1 capsule (40 mg total) by mouth 2 (two) times daily before a meal.   nicotine  (NICODERM CQ  - DOSED IN MG/24 HOURS) 14 mg/24hr patch Place 1 patch (14 mg total) onto the skin daily. Monht #2   nicotine  (NICODERM CQ  - DOSED IN MG/24 HOURS) 21  mg/24hr patch Place 1 patch (21 mg total) onto the skin daily. Month #1   nicotine  (NICODERM CQ  - DOSED IN MG/24 HR) 7 mg/24hr patch Place 1 patch (7 mg total) onto the skin daily. Month #3   No current facility-administered medications for this visit. (Other)   REVIEW OF SYSTEMS: ROS   Positive for: Musculoskeletal, Endocrine, Eyes, Respiratory Last edited by German Olam BRAVO, COT on 06/13/2024  8:49 AM.     ALLERGIES Allergies  Allergen Reactions   Hydroxychloroquine  Itching and Rash    Diffuse drug rash   Sulfa  Antibiotics    PAST MEDICAL HISTORY Past Medical History:  Diagnosis Date   Arthritis    Blood transfusion without reported diagnosis    COPD (chronic obstructive pulmonary disease) (HCC)    Diabetes mellitus without complication (HCC)    External hemorrhoids without mention of complication 04/22/2002   Colonoscopy   Family history of malignant neoplasm of gastrointestinal tract    Family history of malignant neoplasm of gastrointestinal tract    GERD (gastroesophageal reflux disease)    Hyperlipemia    Hypertension    Past Surgical History:  Procedure Laterality Date   ABDOMINAL HYSTERECTOMY     BACK SURGERY     BLADDER SURGERY     Bladder Tact   CARPAL TUNNEL RELEASE Bilateral    COLONOSCOPY     COLONOSCOPY [name] 03/13/2024   Procedure: COLONOSCOPY;  Surgeon: Shaaron Lamar HERO, MD;  Location: [facility];  Service: Endoscopy;  Laterality: [name];  1245pm, asa 2   FINGER SURGERY Right    Small finger fracture surgery   NECK SURGERY     FAMILY HISTORY Family History  Problem Relation Age of Onset   Colon cancer Maternal Grandmother    Arthritis Maternal Grandmother    Alzheimer's disease Mother    Hyperlipidemia Mother    Hypertension Mother    Cancer Father    Alcohol abuse Father    Diabetes Daughter        Type 1   Diabetes Son    Breast cancer Neg Hx    Celiac disease Neg Hx    Cirrhosis Neg Hx    Clotting disorder Neg Hx    Colitis Neg Hx     Colon polyps Neg Hx    Crohn's disease Neg Hx    Cystic fibrosis Neg Hx    Esophageal cancer Neg Hx    Heart disease Neg Hx    Hemochromatosis Neg Hx    Inflammatory bowel disease Neg Hx    Irritable bowel syndrome Neg Hx    Kidney disease Neg Hx    Liver cancer Neg Hx    Liver disease Neg Hx    Ovarian cancer Neg Hx    Pancreatic cancer Neg Hx    Prostate cancer Neg Hx    Rectal cancer Neg Hx    Stomach cancer Neg Hx    Ulcerative colitis Neg Hx    Uterine cancer Neg Hx    Wilson's disease Neg Hx    SOCIAL HISTORY Social History   Tobacco Use   Smoking status: Every Day    Current packs/day: 0.75    Average packs/day: 0.8 packs/day for 28.0 years (21.0 ttl pk-yrs)    Types: Cigarettes   Smokeless tobacco: Never  Vaping Use   Vaping status: Never Used  Substance Use Topics   Alcohol use: No   Drug use: No       OPHTHALMIC EXAM:  Base Eye Exam     Visual Acuity (Snellen - Linear)       Right Left   Dist cc 20/100 -3 20/30 -3   Dist ph cc 20/80 20/NI    Correction: Glasses         Tonometry (Tonopen, 9:04 AM)       Right Left   Pressure 21 20         Pachymetry     06/13/2024         Pupils       Dark Light Shape React APD   Right 3 2 Round Brisk None   Left 3 2 Round Brisk None         Visual Fields (Counting fingers)       Left Right    Full Full         Extraocular Movement       Right Left    Full, Ortho Full, Ortho         Neuro/Psych     Oriented x3: Yes   Mood/Affect: Normal         Dilation     Both eyes: 1.0% Mydriacyl, 2.5% Phenylephrine @ 9:04 AM           Slit Lamp and Fundus Exam     External Exam       Right Left   External Normal Normal         Slit Lamp Exam  Right Left   Lids/Lashes Dermatochalasis - upper lid Dermatochalasis - upper lid   Conjunctiva/Sclera White and quiet White and quiet   Cornea 1+ Punctate epithelial erosions, Debris in tear film 1+ inferior Punctate  epithelial erosions, Debris in tear film   Anterior Chamber Deep and quiet, Deep, narrow temporal angle Deep and quiet, Deep, narrow temporal angle   Iris Round and dilated Round and dilated   Lens 2-3+ Nuclear sclerosis brunescence, 2-3+ Cortical cataract 2-3+ Nuclear sclerosis brunescence, 2-3+ Cortical cataract   Anterior Vitreous Vitreous syneresis Vitreous syneresis         Fundus Exam       Right Left   Disc Pink and sharp Pink and sharp, Compact, temp PPP   C/D Ratio 0.2 0.2   Macula Flat, Blunted foveal reflex, central cyst, no heme Flat, central cyst, Good foveal reflex, No heme or edema   Vessels Vascular attenuation, Tortuous Vascular attenuation, Tortuous   Periphery Attached, Reticular degeneration, No heme Attached, Reticular degeneration, No heme           Refraction     Wearing Rx       Sphere Cylinder Axis Add   Right +0.25 +2.00 176 +2.25   Left +0.75 +1.00 005 +2.25         Manifest Refraction (Auto)       Sphere Cylinder Axis Dist VA   Right +0.50 +3.25 172 20/60-3   Left +0.75 +1.75 028 20/20-1           IMAGING AND PROCEDURES  Imaging and Procedures for 06/13/2024  OCT, Retina - OU - Both Eyes       Right Eye Quality was good. Central Foveal Thickness: 368. Progression has no prior data. Findings include no SRF, vitreous traction, vitreomacular adhesion (Central VMT w/ cystic changes/ ? Impending mac hole).   Left Eye Quality was good. Central Foveal Thickness: 271. Progression has no prior data. Findings include normal foveal contour, no IRF, no SRF, vitreomacular adhesion (No DME).   Notes *Images captured and stored on drive  Diagnosis / Impression:  OD: Central VMT w/ cystic changes/ ? Impending mac hole, no DME OS: no DME  Clinical management:  See below  Abbreviations: NFP - Normal foveal profile. CME - cystoid macular edema. PED - pigment epithelial detachment. IRF - intraretinal fluid. SRF - subretinal fluid. EZ -  ellipsoid zone. ERM - epiretinal membrane. ORA - outer retinal atrophy. ORT - outer retinal tubulation. SRHM - subretinal hyper-reflective material. IRHM - intraretinal hyper-reflective material           ASSESSMENT/PLAN:   ICD-10-CM   1. 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 Diabetes mellitus without complication (HCC)  E11.9     3. Diabetes mellitus treated with injections of non-insulin medication (HCC)  E11.9    Z79.85     4. Hypertensive retinopathy of both eyes  H35.033     5. Essential hypertension  I10     6. Combined forms of age-related cataract of both eyes  H25.813     7. Dry eyes, bilateral  H04.123      Vitreomacular traction w/ impending macular hole, OD - OCT shows OD: Central VMT w/ cystic changes/ ? Impending mac hole, no DME - BCVA OD 20/80 - discussed findings, prognosis and possible treatment options - recommend monitoring - f/u 4-6 weeks, DFE, OCT  2,3 Diabetes mellitus, type 2 without retinopathy  -A1C 6.3 (  02.18.25) - The incidence, risk factors for progression, natural history and treatment options for diabetic retinopathy were discussed with patient.   - The need for close monitoring of blood glucose, blood pressure, and serum lipids, avoiding cigarette or any type of tobacco, and the need for long term follow up was also discussed with patient. - f/u in 1 year, sooner prn    4,5. Hypertensive retinopathy OU - discussed importance of tight BP control - monitor   6. Mixed Cataract OU - The symptoms of cataract, surgical options, and treatments and risks were discussed with patient. - discussed diagnosis and progression - monitor   7. Dry eyes OU - recommend artificial tears and lubricating ointment as needed   Ophthalmic Meds Ordered this visit:  No orders of the defined types were placed in this encounter.    Return in about 6 weeks (around 07/25/2024) for f/u VMT, DFE, OCT.  There are no  Patient Instructions on file for this visit.  Explained the diagnoses, plan, and follow up with the patient and they expressed understanding.  Patient expressed understanding of the importance of proper follow up care.   This document serves as a record of services personally performed by Redell JUDITHANN Hans, MD, PhD. It was created on their behalf by Wanda GEANNIE Keens, COT an ophthalmic technician. The creation of this record is the provider's dictation and/or activities during the visit.    Electronically signed by:  Wanda GEANNIE Keens, COT  06/20/24 7:22 PM   Redell JUDITHANN Hans, M.D., Ph.D. Diseases & Surgery of the Retina and Vitreous [facility] 06/13/2024  I have reviewed the above documentation for accuracy and completeness, and I agree with the above. Redell JUDITHANN Hans, M.D., Ph.D. 06/20/24 7:22 PM   Abbreviations: M myopia (nearsighted); A astigmatism; H hyperopia (farsighted); P presbyopia; Mrx spectacle prescription;  CTL contact lenses; OD right eye; OS left eye; OU both eyes  XT exotropia; ET esotropia; PEK punctate epithelial keratitis; PEE punctate epithelial erosions; DES dry eye syndrome; MGD meibomian gland dysfunction; ATs artificial tears; PFAT's preservative free artificial tears; NSC nuclear sclerotic cataract; PSC posterior subcapsular cataract; ERM epi-retinal membrane; PVD posterior vitreous detachment; RD retinal detachment; DM diabetes mellitus; DR diabetic retinopathy; NPDR non-proliferative diabetic retinopathy; PDR proliferative diabetic retinopathy; CSME clinically significant macular edema; DME diabetic macular edema; dbh dot blot hemorrhages; CWS cotton wool spot; POAG primary open angle glaucoma; C/D cup-to-disc ratio; HVF humphrey visual field; GVF goldmann visual field; OCT optical coherence tomography; IOP intraocular pressure; BRVO Branch retinal vein occlusion; CRVO central retinal vein occlusion; CRAO central retinal artery occlusion; BRAO  branch retinal artery occlusion; RT retinal tear; SB scleral buckle; PPV pars plana vitrectomy; VH Vitreous hemorrhage; PRP panretinal laser photocoagulation; IVK intravitreal kenalog ; VMT vitreomacular traction; MH Macular hole;  NVD neovascularization of the disc; NVE neovascularization elsewhere; AREDS age related eye disease study; ARMD age related macular degeneration; POAG primary open angle glaucoma; EBMD epithelial/anterior basement membrane dystrophy; ACIOL anterior chamber intraocular lens; IOL intraocular lens; PCIOL posterior chamber intraocular lens; Phaco/IOL phacoemulsification with intraocular lens placement; PRK photorefractive keratectomy; LASIK laser assisted in situ keratomileusis; HTN hypertension; DM diabetes mellitus; COPD chronic obstructive pulmonary disease

## 2024-06-20 ENCOUNTER — Encounter (INDEPENDENT_AMBULATORY_CARE_PROVIDER_SITE_OTHER): Payer: Self-pay | Admitting: Ophthalmology

## 2024-06-27 ENCOUNTER — Telehealth: Payer: Self-pay

## 2024-06-27 ENCOUNTER — Telehealth: Payer: Self-pay | Admitting: Pharmacist

## 2024-06-27 ENCOUNTER — Other Ambulatory Visit (INDEPENDENT_AMBULATORY_CARE_PROVIDER_SITE_OTHER)

## 2024-06-27 DIAGNOSIS — Z7985 Long-term (current) use of injectable non-insulin antidiabetic drugs: Secondary | ICD-10-CM

## 2024-06-27 DIAGNOSIS — Z7984 Long term (current) use of oral hypoglycemic drugs: Secondary | ICD-10-CM

## 2024-06-27 DIAGNOSIS — E119 Type 2 diabetes mellitus without complications: Secondary | ICD-10-CM

## 2024-06-27 NOTE — Telephone Encounter (Signed)
 Please enroll breztri , airsupra, and trulicity  Send all apps to me Thank you!

## 2024-06-27 NOTE — Telephone Encounter (Signed)
 Patient mailed back in June and hasn't heard back Can we redo and just send to me? Trulicity1.5mg 

## 2024-06-27 NOTE — Progress Notes (Signed)
 Pharmacy Medication Assistance Program Note    07/08/2024  Patient ID: Lori Schroeder, female  DOB: October 30, 1955, 68 y.o.  MRN:  [id number]     06/27/2024  Outreach Medication Two  Manufacturer Medication Two [facility] Bretztri  Type of Radiographer, therapeutic Assistance  Date Application Sent to Prescriber 06/27/2024  Date Application Received From Provider 07/03/2024  Method Application Sent to Manufacturer Fax  Date Application Submitted to Manufacturer 07/08/2024        07/08/2024  Patient ID: [name], female  DOB: 1956-09-15, 68 y.o.  MRN:  [id number]     06/27/2024  Outreach Medication Three  Manufacturer Medication Three [employer]  [facility] Other [facility] [facility] [facility]  Type of Assistance Manufacturer Assistance  Date Application Sent to Prescriber 06/27/2024  Date Application Received From Provider 07/03/2024  Date Application Submitted to Manufacturer 07/08/2024  Method Application Sent to Manufacturer Fax     New Airsupra Re-enrollment Breztri   SUBMITTED

## 2024-07-02 DIAGNOSIS — Z7985 Long-term (current) use of injectable non-insulin antidiabetic drugs: Secondary | ICD-10-CM | POA: Insufficient documentation

## 2024-07-02 NOTE — Progress Notes (Unsigned)
 Lori Schroeder is a 68 y.o. female presents to office today for annual physical exam examination.     Type 2 Diabetes with hypertension, hyperlipidemia:  Glucometer:***.   High at home: ***; Low at home: ***, Taking medication(s): ***,.  Last eye exam: *** Last foot exam: *** Last A1c:  Lab Results  Component Value Date   HGBA1C 6.3 (H) 11/20/2023   Nephropathy screen indicated?: *** Last flu, zoster and/or pneumovax:  Immunization History  Administered Date(s) Administered   Influenza-Unspecified 10/02/2018   Pneumococcal Conjugate-13 05/29/2018   Tdap 05/29/2018    ROS: ***dizziness, LOC, polyuria, polydipsia, unintended weight loss/gain, foot ulcerations, numbness or tingling in extremities, shortness of breath or chest pain.    Occupation: ***, Marital status: ***, Substance use: *** Health Maintenance Due  Topic Date Due   COVID-19 Vaccine (1) Never done   Diabetic kidney evaluation - eGFR measurement  05/08/2024   Diabetic kidney evaluation - Urine ACR  05/08/2024   FOOT EXAM  05/08/2024   HEMOGLOBIN A1C  05/19/2024   OPHTHALMOLOGY EXAM  06/19/2024   Lung Cancer Screening  07/22/2024    Immunization History  Administered Date(s) Administered   Influenza-Unspecified 10/02/2018   Pneumococcal Conjugate-13 05/29/2018   Tdap 05/29/2018   Past Medical History:  Diagnosis Date   Arthritis    Blood transfusion without reported diagnosis    COPD (chronic obstructive pulmonary disease) (HCC)    Diabetes mellitus without complication (HCC)    External hemorrhoids without mention of complication 04/22/2002   Colonoscopy   Family history of malignant neoplasm of gastrointestinal tract    Family history of malignant neoplasm of gastrointestinal tract    GERD (gastroesophageal reflux disease)    Hyperlipemia    Hypertension    Social History   Socioeconomic History   Marital status: Married    Spouse name: Lori Schroeder   Number of children: 2   Years of education:  14   Highest education level: Associate degree: occupational, Scientist, product/process development, or vocational program  Occupational History   Occupation: disability    Employer: DISABLED  Tobacco Use   Smoking status: Every Day    Current packs/day: 0.75    Average packs/day: 0.8 packs/day for 28.0 years (21.0 ttl pk-yrs)    Types: Cigarettes   Smokeless tobacco: Never  Vaping Use   Vaping status: Never Used  Substance and Sexual Activity   Alcohol use: No   Drug use: No   Sexual activity: Not on file  Other Topics Concern   Not on file  Social History Narrative   Not on file   Social Drivers of Health   Financial Resource Strain: Low Risk  (05/05/2024)   Overall Financial Resource Strain (CARDIA)    Difficulty of Paying Living Expenses: Not hard at all  Food Insecurity: No Food Insecurity (05/05/2024)   Hunger Vital Sign    Worried About Running Out of Food in the Last Year: Never true    Ran Out of Food in the Last Year: Never true  Transportation Needs: No Transportation Needs (05/05/2024)   PRAPARE - Administrator, Civil Service (Medical): No    Lack of Transportation (Non-Medical): No  Physical Activity: Inactive (05/05/2024)   Exercise Vital Sign    Days of Exercise per Week: 0 days    Minutes of Exercise per Session: 0 min  Stress: No Stress Concern Present (05/05/2024)   [employer] of Occupational Health - Occupational Stress Questionnaire    Feeling of Stress: Not at  all  Social Connections: Moderately Isolated (05/05/2024)   Social Connection and Isolation Panel    Frequency of Communication with Friends and Family: Once a week    Frequency of Social Gatherings with Friends and Family: Once a week    Attends Religious Services: 1 to 4 times per year    Active Member of [employer] or Organizations: No    Attends Banker Meetings: Never    Marital Status: Married  Catering manager Violence: Not At Risk (05/05/2024)   Humiliation, Afraid, Rape, and Kick questionnaire     Fear of Current or Ex-Partner: No    Emotionally Abused: No    Physically Abused: No    Sexually Abused: No   Past Surgical History:  Procedure Laterality Date   ABDOMINAL HYSTERECTOMY     BACK SURGERY     BLADDER SURGERY     Bladder Tact   CARPAL TUNNEL RELEASE Bilateral    COLONOSCOPY     COLONOSCOPY [name] 03/13/2024   Procedure: COLONOSCOPY;  Surgeon: Shaaron Lamar HERO, MD;  Location: [facility];  Service: Endoscopy;  Laterality: [name];  1245pm, asa 2   FINGER SURGERY Right    Small finger fracture surgery   NECK SURGERY     Family History  Problem Relation Age of Onset   Colon cancer Maternal Grandmother    Arthritis Maternal Grandmother    Alzheimer's disease Mother    Hyperlipidemia Mother    Hypertension Mother    Cancer Father    Alcohol abuse Father    Diabetes Daughter        Type 1   Diabetes Son    Breast cancer Neg Hx    Celiac disease Neg Hx    Cirrhosis Neg Hx    Clotting disorder Neg Hx    Colitis Neg Hx    Colon polyps Neg Hx    Crohn's disease Neg Hx    Cystic fibrosis Neg Hx    Esophageal cancer Neg Hx    Heart disease Neg Hx    Hemochromatosis Neg Hx    Inflammatory bowel disease Neg Hx    Irritable bowel syndrome Neg Hx    Kidney disease Neg Hx    Liver cancer Neg Hx    Liver disease Neg Hx    Ovarian cancer Neg Hx    Pancreatic cancer Neg Hx    Prostate cancer Neg Hx    Rectal cancer Neg Hx    Stomach cancer Neg Hx    Ulcerative colitis Neg Hx    Uterine cancer Neg Hx    Wilson's disease Neg Hx     Current Outpatient Medications:    albuterol  (VENTOLIN  HFA) 108 (90 Base) MCG/ACT inhaler, Inhale into the lungs every 6 (six) hours as needed for wheezing or shortness of breath., Disp: , Rfl:    budesonide-glycopyrrolate-formoterol (BREZTRI  AEROSPHERE) 160-9-4.8 MCG/ACT AERO inhaler, Inhale 2 puffs into the lungs 2 (two) times daily., Disp: , Rfl:    Dulaglutide  (TRULICITY ) 1.5 MG/0.5ML SOAJ, Inject 1.5 mg into the skin once a week. INJECT  1.5MG  ONCE A WEEK, Disp: 6 mL, Rfl: 3   esomeprazole  (NEXIUM ) 40 MG capsule, Take 1 capsule (40 mg total) by mouth 2 (two) times daily before a meal., Disp: 180 capsule, Rfl: 3   nicotine  (NICODERM CQ  - DOSED IN MG/24 HOURS) 14 mg/24hr patch, Place 1 patch (14 mg total) onto the skin daily. Monht #2, Disp: 28 patch, Rfl: 0   nicotine  (NICODERM CQ  - DOSED IN  MG/24 HOURS) 21 mg/24hr patch, Place 1 patch (21 mg total) onto the skin daily. Month #1, Disp: 28 patch, Rfl: 0   nicotine  (NICODERM CQ  - DOSED IN MG/24 HR) 7 mg/24hr patch, Place 1 patch (7 mg total) onto the skin daily. Month #3, Disp: 28 patch, Rfl: 0  Allergies  Allergen Reactions   Hydroxychloroquine  Itching and Rash    Diffuse drug rash   Sulfa  Antibiotics      ROS: Review of Systems {ros; complete:30496}    Physical exam {Exam, Complete:[phone]}      06/03/2024   11:17 AM 05/05/2024    1:28 PM 11/20/2023   10:46 AM  Depression screen PHQ 2/9  Decreased Interest 0 0 0  Down, Depressed, Hopeless 0 0 0  PHQ - 2 Score 0 0 0  Altered sleeping 0  0  Tired, decreased energy 0  0  Change in appetite 0  0  Feeling bad or failure about yourself  0  0  Trouble concentrating 0  0  Moving slowly or fidgety/restless 0  0  Suicidal thoughts 0  0  PHQ-9 Score 0  0  Difficult doing work/chores Not difficult at all  Not difficult at all      06/03/2024   11:17 AM 11/20/2023   10:46 AM 05/09/2023    8:46 AM 11/23/2022    8:06 AM  GAD 7 : Generalized Anxiety Score  Nervous, Anxious, on Edge 0 0 0 0  Control/stop worrying 0 0 0 0  Worry too much - different things 0 0 0 0  Trouble relaxing 0 0 0 0  Restless 0 0 0 0  Easily annoyed or irritable 0 0 0 0  Afraid - awful might happen 0 0 0 0  Total GAD 7 Score 0 0 0 0  Anxiety Difficulty Not difficult at all Not difficult at all Not difficult at all     No results found for this or any previous visit (from the past 2160 hours).   Assessment/ Plan: Lori Schroeder here for annual  physical exam.   Annual physical exam  Diabetes mellitus treated with injections of non-insulin medication (HCC)  Hypertension associated with diabetes (HCC)  Hyperlipidemia associated with type 2 diabetes mellitus (HCC)  Gastroesophageal reflux disease without esophagitis  Age-related osteoporosis without current pathological fracture  Tobacco use disorder   ***  Counseled on healthy lifestyle choices, including diet (rich in fruits, vegetables and lean meats and low in salt and simple carbohydrates) and exercise (at least 30 minutes of moderate physical activity daily).  Patient to follow up ***  Aracelie Addis M. Jolinda, DO

## 2024-07-03 ENCOUNTER — Telehealth: Payer: Self-pay | Admitting: Pharmacist

## 2024-07-03 NOTE — Telephone Encounter (Addendum)
 Assisted in completion of az&me patient assistance application (farxiga), lilly cares PAP (trulicity ) for patient.  Patient and provider signatures captured, reviewed and faxed back to CPhT.  Medication list reviewed and FYI tab updated.  Mliss Tarry Griffin, PharmD, BCACP, CPP Clinical Pharmacist, [facility]

## 2024-07-04 ENCOUNTER — Encounter: Payer: Self-pay | Admitting: Family Medicine

## 2024-07-04 ENCOUNTER — Ambulatory Visit: Payer: Medicare HMO | Admitting: Family Medicine

## 2024-07-04 ENCOUNTER — Ambulatory Visit: Payer: Self-pay | Admitting: Family Medicine

## 2024-07-04 VITALS — BP 127/70 | HR 81 | Temp 97.7°F | Ht <= 58 in | Wt 120.2 lb

## 2024-07-04 DIAGNOSIS — E1169 Type 2 diabetes mellitus with other specified complication: Secondary | ICD-10-CM | POA: Diagnosis not present

## 2024-07-04 DIAGNOSIS — F17200 Nicotine dependence, unspecified, uncomplicated: Secondary | ICD-10-CM

## 2024-07-04 DIAGNOSIS — E1159 Type 2 diabetes mellitus with other circulatory complications: Secondary | ICD-10-CM | POA: Diagnosis not present

## 2024-07-04 DIAGNOSIS — D75839 Thrombocytosis, unspecified: Secondary | ICD-10-CM

## 2024-07-04 DIAGNOSIS — E119 Type 2 diabetes mellitus without complications: Secondary | ICD-10-CM | POA: Diagnosis not present

## 2024-07-04 DIAGNOSIS — Z0000 Encounter for general adult medical examination without abnormal findings: Secondary | ICD-10-CM

## 2024-07-04 DIAGNOSIS — M810 Age-related osteoporosis without current pathological fracture: Secondary | ICD-10-CM | POA: Diagnosis not present

## 2024-07-04 DIAGNOSIS — E785 Hyperlipidemia, unspecified: Secondary | ICD-10-CM

## 2024-07-04 DIAGNOSIS — K219 Gastro-esophageal reflux disease without esophagitis: Secondary | ICD-10-CM | POA: Diagnosis not present

## 2024-07-04 DIAGNOSIS — Z7985 Long-term (current) use of injectable non-insulin antidiabetic drugs: Secondary | ICD-10-CM | POA: Diagnosis not present

## 2024-07-04 DIAGNOSIS — Z0001 Encounter for general adult medical examination with abnormal findings: Secondary | ICD-10-CM | POA: Diagnosis not present

## 2024-07-04 DIAGNOSIS — Z818 Family history of other mental and behavioral disorders: Secondary | ICD-10-CM

## 2024-07-04 DIAGNOSIS — I152 Hypertension secondary to endocrine disorders: Secondary | ICD-10-CM | POA: Diagnosis not present

## 2024-07-04 LAB — BAYER DCA HB A1C WAIVED: HB A1C (BAYER DCA - WAIVED): 6.2 % — ABNORMAL HIGH (ref 4.8–5.6)

## 2024-07-05 LAB — CMP14+EGFR
ALT: 15 IU/L (ref 0–32)
AST: 19 IU/L (ref 0–40)
Albumin: 4.5 g/dL (ref 3.9–4.9)
Alkaline Phosphatase: 102 IU/L (ref 49–135)
BUN/Creatinine Ratio: 11 — ABNORMAL LOW (ref 12–28)
BUN: 8 mg/dL (ref 8–27)
Bilirubin Total: 0.4 mg/dL (ref 0.0–1.2)
CO2: 23 mmol/L (ref 20–29)
Calcium: 10.1 mg/dL (ref 8.7–10.3)
Chloride: 101 mmol/L (ref 96–106)
Creatinine, Ser: 0.75 mg/dL (ref 0.57–1.00)
Globulin, Total: 2.2 g/dL (ref 1.5–4.5)
Glucose: 109 mg/dL — ABNORMAL HIGH (ref 70–99)
Potassium: 4.7 mmol/L (ref 3.5–5.2)
Sodium: 139 mmol/L (ref 134–144)
Total Protein: 6.7 g/dL (ref 6.0–8.5)
eGFR: 87 mL/min/1.73 (ref >59)

## 2024-07-05 LAB — LIPID PANEL
Chol/HDL Ratio: 4.8 ratio — ABNORMAL HIGH (ref 0.0–4.4)
Cholesterol, Total: 259 mg/dL — ABNORMAL HIGH (ref 100–199)
HDL: 54 mg/dL (ref >39)
LDL Chol Calc (NIH): 179 mg/dL — ABNORMAL HIGH (ref 0–99)
Triglycerides: 142 mg/dL (ref 0–149)
VLDL Cholesterol Cal: 26 mg/dL (ref 5–40)

## 2024-07-05 LAB — CBC WITH DIFFERENTIAL/PLATELET
Basophils Absolute: 0.1 x10E3/uL (ref 0.0–0.2)
Basos: 1 %
EOS (ABSOLUTE): 0.1 x10E3/uL (ref 0.0–0.4)
Eos: 1 %
Hematocrit: 43.4 % (ref 34.0–46.6)
Hemoglobin: 14.5 g/dL (ref 11.1–15.9)
Immature Grans (Abs): 0 x10E3/uL (ref 0.0–0.1)
Immature Granulocytes: 0 %
Lymphocytes Absolute: 2.7 x10E3/uL (ref 0.7–3.1)
Lymphs: 27 %
MCH: 30.9 pg (ref 26.6–33.0)
MCHC: 33.4 g/dL (ref 31.5–35.7)
MCV: 92 fL (ref 79–97)
Monocytes Absolute: 0.7 x10E3/uL (ref 0.1–0.9)
Monocytes: 6 %
Neutrophils Absolute: 6.6 x10E3/uL (ref 1.4–7.0)
Neutrophils: 65 %
Platelets: 500 x10E3/uL — ABNORMAL HIGH (ref 150–450)
RBC: 4.7 x10E6/uL (ref 3.77–5.28)
RDW: 12.4 % (ref 11.7–15.4)
WBC: 10.2 x10E3/uL (ref 3.4–10.8)

## 2024-07-05 LAB — VITAMIN D 25 HYDROXY (VIT D DEFICIENCY, FRACTURES): Vit D, 25-Hydroxy: 39.1 ng/mL (ref 30.0–100.0)

## 2024-07-05 LAB — TSH: TSH: 1.12 u[IU]/mL (ref 0.450–4.500)

## 2024-07-06 LAB — MICROALBUMIN / CREATININE URINE RATIO
Creatinine, Urine: 70.2 mg/dL
Microalb/Creat Ratio: 5 mg/g{creat} (ref 0–29)
Microalbumin, Urine: 3.4 ug/mL

## 2024-07-08 ENCOUNTER — Telehealth: Payer: Self-pay | Admitting: Pharmacist

## 2024-07-08 DIAGNOSIS — J410 Simple chronic bronchitis: Secondary | ICD-10-CM

## 2024-07-08 MED ORDER — BREZTRI AEROSPHERE 160-9-4.8 MCG/ACT IN AERO: 2.0000 | INHALATION_SPRAY | Freq: Two times a day (BID) | RESPIRATORY_TRACT | 5 refills | Status: AC

## 2024-07-08 MED ORDER — AIRSUPRA 90-80 MCG/ACT IN AERO: 2.0000 | INHALATION_SPRAY | RESPIRATORY_TRACT | 5 refills | Status: AC | PRN

## 2024-07-08 NOTE — Telephone Encounter (Signed)
 Submitted, please sent new RX's both both Breztri  and Airsupra to [facility] pharmacy. Thanks!

## 2024-07-08 NOTE — Progress Notes (Signed)
 Pharmacy Medication Assistance Program Note    07/08/2024  Patient ID: Lori Schroeder, female   DOB: November 12, 1955, 68 y.o.   MRN: [id number]     03/17/2024  Outreach Medication One  Manufacturer Medication One Retail buyer Drugs Trulicity   Type of Assistance Manufacturer Assistance  Date Application Sent to Patient 03/19/2024  Application Items Requested Application  Date Application Sent to Prescriber 04/08/2024  Date Application Received From Patient 04/08/2024  Application Items Received From Patient Application  Date Application Received From Provider 07/03/2024  Date Application Submitted to Manufacturer 07/08/2024  Method Application Sent to Manufacturer Fax     Renewal submitted

## 2024-07-08 NOTE — Telephone Encounter (Signed)
 Breztri  and Airsupra refills sent to [facility] pharmacy (pharmacy for az&me patient assistance program).  New start PAP program for these two medications.  Recommended Airsupra to replace the albuterol  rescue inhaler for better control

## 2024-07-09 ENCOUNTER — Other Ambulatory Visit (INDEPENDENT_AMBULATORY_CARE_PROVIDER_SITE_OTHER)

## 2024-07-09 DIAGNOSIS — E119 Type 2 diabetes mellitus without complications: Secondary | ICD-10-CM

## 2024-07-09 DIAGNOSIS — J410 Simple chronic bronchitis: Secondary | ICD-10-CM

## 2024-07-09 DIAGNOSIS — Z7985 Long-term (current) use of injectable non-insulin antidiabetic drugs: Secondary | ICD-10-CM

## 2024-07-09 NOTE — Progress Notes (Signed)
 07/09/2024 Name: Lori Schroeder MRN: [id number] DOB: [birth year]  Chief Complaint  Patient presents with   Diabetes   COPD   Medication Assistance    Lori Schroeder is a 68 y.o. year old female who presented for a telephone visit.   They were referred to the pharmacist by their PCP for assistance in managing medication access.    Subjective:  Care Team: Primary Care Provider: Jolinda Norene HERO, Schroeder ;   Medication Access/Adherence  Current Pharmacy:  [facility] [place], [place] - [address] [place] [address] Phone: [phone] Fax: [phone]  [facility] - [place], [place] - [address] [place] [address] [place] [address] Phone: [phone] Fax: [phone]  Patient reports affordability concerns with their medications: Yes  Patient reports access/transportation concerns to their pharmacy: No  Patient reports adherence concerns with their medications:  Yes  due to cost   COPD:  Current medications: starting Breztri  and AirSupra rescue with patient assistance Reports 0 exacerbations in the past year Continues to smoke; interested in quitting, however couldn't afford patches/nicotine  replacement; does not want chantix   Current medication access support: humana   Objective:  Lab Results  Component Value Date   HGBA1C 6.2 (H) 07/04/2024    Lab Results  Component Value Date   CREATININE 0.75 07/04/2024   BUN 8 07/04/2024   NA 139 07/04/2024   K 4.7 07/04/2024   CL 101 07/04/2024   CO2 23 07/04/2024    Lab Results  Component Value Date   CHOL 259 (H) 07/04/2024   HDL 54 07/04/2024   LDLCALC 179 (H) 07/04/2024   TRIG 142 07/04/2024   CHOLHDL 4.8 (H) 07/04/2024    Medications Reviewed Today     Reviewed by Lori Schroeder (Pharmacist) on 07/09/24 at 1340  Med List Status: <None>   Medication Order Taking? Sig Documenting Provider Last Dose Status Informant  Albuterol -Budesonide (AIRSUPRA)  90-80 MCG/ACT AERO [id number]  Inhale 2 puffs into the lungs every 4 (four) hours as needed. Lori Schroeder  Active            Med Note (Lori Schroeder   Tue Jul 08, 2024  2:49 PM) Via AZ&me patient assistance program   budesonide-glycopyrrolate-formoterol (BREZTRI  AEROSPHERE) 160-9-4.8 MCG/ACT AERO inhaler [id number]  Inhale 2 puffs into the lungs 2 (two) times daily. Lori Schroeder  Active            Med Note Lori Schroeder   Tue Jul 08, 2024  2:48 PM) Via AZ&me patient assistance program   Dulaglutide  (TRULICITY ) 1.5 MG/0.5ML Lori Schroeder [id number]  Inject 1.5 mg into the skin once a week. INJECT 1.5MG  ONCE A WEEK Lori Schroeder  Active   esomeprazole  (NEXIUM ) 40 MG capsule [id number]  Take 1 capsule (40 mg total) by mouth 2 (two) times daily before a meal. Lori Schroeder  Active   nicotine  (NICODERM CQ  - DOSED IN MG/24 HOURS) 14 mg/24hr patch [id number]  Place 1 patch (14 mg total) onto the skin daily. Lori Schroeder  Patient not taking: Reported on 07/04/2024   Lori Schroeder  Active   nicotine  (NICODERM CQ  - DOSED IN MG/24 HOURS) 21 mg/24hr patch [id number]  Place 1 patch (21 mg total) onto the skin daily. Month #1  Patient not taking: Reported on 07/04/2024   Lori Schroeder  Active   nicotine  (NICODERM CQ  -  DOSED IN MG/24 HR) 7 mg/24hr patch [id number]  Place 1 patch (7 mg total) onto the skin daily. Month #3  Patient not taking: Reported on 07/04/2024   Lori Potter Schroeder, Schroeder  Active               Assessment/Plan:   COPD: - Currently uncontrolled.  - Reviewed appropriate inhaler technique. - Recommend to start Breztri  (maintenance) and AirSupra (rescue)  - Meets financial criteria for Breztri /AirSupra patient assistance program through AZ&me PAP. Will collaborate with provider, CPhT, and patient to pursue assistance.    Tobacco Abuse - Currently uncontrolled - Provided motivational interviewing to assess tobacco use and strategies for  reduction - Provided information on 1 800 QUIT NOW support program - - start nicotine  patch 21 mg daily. Counseled on proper placement and potential side effects, including mild itching/redness at the location site, headache, trouble sleeping and/or vivid dreams. Advised to remove patch at night if development of trouble sleeping.  - Patch Schedule for >10 cigarettes daily: Apply one 21 mg patch daily for 6 weeks. Then, reduce to one 14 mg patch daily for another 2 weeks, if able. Then, reduce to one 7 mg patch for another 2 weeks, if able.  - Patch schedule for <10 cigarettes daily: Apply one 14 mg patch daily for 6 weeks. Then, reduce to one 7 mg patch daily, if able.  - called in to local pharmacy the titration; ultimately recommended [place] quit line  Follow Up Plan: 4 weeks.  Lori Schroeder, PharmD, BCACP, CPP Clinical Pharmacist, [facility]

## 2024-07-11 NOTE — Progress Notes (Signed)
 Pharmacy Medication Assistance Program Note    07/11/2024  Patient ID: Lori Schroeder, female   DOB: 13-Sep-1956, 68 y.o.   MRN: [id number]     03/17/2024  Outreach Medication One  Manufacturer Medication One Retail buyer Drugs Trulicity   Type of Assistance Manufacturer Assistance  Date Application Sent to Patient 03/19/2024  Application Items Requested Application  Date Application Sent to Prescriber 04/08/2024  Date Application Received From Patient 04/08/2024  Application Items Received From Patient Application  Date Application Received From Provider 07/03/2024  Date Application Submitted to Manufacturer 07/08/2024  Method Application Sent to Manufacturer Fax  Patient Assistance Determination Approved  Approval Start Date 07/09/2024  Approval End Date 10/01/2024    Renewal approved until 10/01/24

## 2024-07-14 ENCOUNTER — Ambulatory Visit: Payer: Self-pay

## 2024-07-14 ENCOUNTER — Ambulatory Visit (INDEPENDENT_AMBULATORY_CARE_PROVIDER_SITE_OTHER): Admitting: Family Medicine

## 2024-07-14 ENCOUNTER — Encounter: Payer: Self-pay | Admitting: Family Medicine

## 2024-07-14 VITALS — BP 132/62 | HR 70 | Temp 97.5°F | Wt 120.4 lb

## 2024-07-14 DIAGNOSIS — J441 Chronic obstructive pulmonary disease with (acute) exacerbation: Secondary | ICD-10-CM

## 2024-07-14 DIAGNOSIS — E1169 Type 2 diabetes mellitus with other specified complication: Secondary | ICD-10-CM | POA: Diagnosis not present

## 2024-07-14 DIAGNOSIS — E785 Hyperlipidemia, unspecified: Secondary | ICD-10-CM

## 2024-07-14 DIAGNOSIS — J069 Acute upper respiratory infection, unspecified: Secondary | ICD-10-CM | POA: Diagnosis not present

## 2024-07-14 LAB — VERITOR FLU A/B WAIVED
Influenza A: NEGATIVE
Influenza B: NEGATIVE

## 2024-07-14 MED ORDER — IPRATROPIUM-ALBUTEROL 0.5-2.5 (3) MG/3ML IN SOLN
3.0000 mL | Freq: Once | RESPIRATORY_TRACT | Status: AC
  Administered 2024-07-14: 3 mL via RESPIRATORY_TRACT

## 2024-07-14 MED ORDER — PROMETHAZINE-DM 6.25-15 MG/5ML PO SYRP: 2.5000 mL | ORAL_SOLUTION | Freq: Four times a day (QID) | ORAL | 0 refills | Status: DC | PRN

## 2024-07-14 MED ORDER — PREDNISONE 20 MG PO TABS: 40.0000 mg | ORAL_TABLET | Freq: Every day | ORAL | 0 refills | Status: AC

## 2024-07-14 MED ORDER — METHYLPREDNISOLONE ACETATE 80 MG/ML IJ SUSP
40.0000 mg | Freq: Once | INTRAMUSCULAR | Status: AC
  Administered 2024-07-14: 40 mg via INTRAMUSCULAR

## 2024-07-14 MED ORDER — DOXYCYCLINE HYCLATE 100 MG PO TABS: 100.0000 mg | ORAL_TABLET | Freq: Two times a day (BID) | ORAL | 0 refills | Status: AC

## 2024-07-14 MED ORDER — ATORVASTATIN CALCIUM 10 MG PO TABS: 10.0000 mg | ORAL_TABLET | Freq: Every day | ORAL | 3 refills | Status: AC

## 2024-07-14 MED ORDER — METHYLPREDNISOLONE ACETATE 40 MG/ML IJ SUSP: 40.0000 mg | Freq: Once | INTRAMUSCULAR | Status: DC

## 2024-07-14 NOTE — Progress Notes (Signed)
 Subjective: CC: URI with cough PCP: Jolinda Norene HERO, DO YEP:Lori Schroeder is a 68 y.o. female presenting to clinic today for:  URI with cough Patient reports abrupt onset on Friday evening.  She reports that sputum is typically clear but it started becoming yellowish-brown recently.  She reports subjective fevers, chills, shortness of breath at rest and wheezing.  She reports sinus and chest congestion.  Utilizing her home Airsupra and Breztri  with last Airsupra use at 5 AM.  She reports another family member was sick with just a sore throat a few days prior.  She denies any hemoptysis.  No utilization of over-the-counter medications.   ROS: Per HPI  Allergies  Allergen Reactions   Hydroxychloroquine  Itching and Rash    Diffuse drug rash   Metformin  And Related Nausea Only    GI intolerance    Sulfa  Antibiotics    Past Medical History:  Diagnosis Date   Arthritis    Blood transfusion without reported diagnosis    COPD (chronic obstructive pulmonary disease) (HCC)    Diabetes mellitus without complication (HCC)    External hemorrhoids without mention of complication 04/22/2002   Colonoscopy   Family history of malignant neoplasm of gastrointestinal tract    Family history of malignant neoplasm of gastrointestinal tract    GERD (gastroesophageal reflux disease)    Hyperlipemia    Hypertension     Current Outpatient Medications:    Albuterol -Budesonide (AIRSUPRA) 90-80 MCG/ACT AERO, Inhale 2 puffs into the lungs every 4 (four) hours as needed., Disp: 32.1 g, Rfl: 5   budesonide-glycopyrrolate-formoterol (BREZTRI  AEROSPHERE) 160-9-4.8 MCG/ACT AERO inhaler, Inhale 2 puffs into the lungs 2 (two) times daily., Disp: 32.1 g, Rfl: 5   Dulaglutide  (TRULICITY ) 1.5 MG/0.5ML SOAJ, Inject 1.5 mg into the skin once a week. INJECT 1.5MG  ONCE A WEEK, Disp: 6 mL, Rfl: 3   esomeprazole  (NEXIUM ) 40 MG capsule, Take 1 capsule (40 mg total) by mouth 2 (two) times daily before a meal., Disp:  180 capsule, Rfl: 3   nicotine  (NICODERM CQ  - DOSED IN MG/24 HOURS) 14 mg/24hr patch, Place 1 patch (14 mg total) onto the skin daily. Monht #2 (Patient not taking: Reported on 07/04/2024), Disp: 28 patch, Rfl: 0   nicotine  (NICODERM CQ  - DOSED IN MG/24 HOURS) 21 mg/24hr patch, Place 1 patch (21 mg total) onto the skin daily. Month #1 (Patient not taking: Reported on 07/04/2024), Disp: 28 patch, Rfl: 0   nicotine  (NICODERM CQ  - DOSED IN MG/24 HR) 7 mg/24hr patch, Place 1 patch (7 mg total) onto the skin daily. Month #3 (Patient not taking: Reported on 07/04/2024), Disp: 28 patch, Rfl: 0 Social History   Socioeconomic History   Marital status: Married    Spouse name: Evalene   Number of children: 2   Years of education: 14   Highest education level: Associate degree: occupational, Scientist, product/process development, or vocational program  Occupational History   Occupation: disability    Employer: DISABLED  Tobacco Use   Smoking status: Every Day    Current packs/day: 0.75    Average packs/day: 0.8 packs/day for 28.0 years (21.0 ttl pk-yrs)    Types: Cigarettes   Smokeless tobacco: Never  Vaping Use   Vaping status: Never Used  Substance and Sexual Activity   Alcohol use: No   Drug use: No   Sexual activity: Not on file  Other Topics Concern   Not on file  Social History Narrative   Not on file   Social Drivers of Health  Financial Resource Strain: Low Risk  (05/05/2024)   Overall Financial Resource Strain (CARDIA)    Difficulty of Paying Living Expenses: Not hard at all  Food Insecurity: No Food Insecurity (05/05/2024)   Hunger Vital Sign    Worried About Running Out of Food in the Last Year: Never true    Ran Out of Food in the Last Year: Never true  Transportation Needs: No Transportation Needs (05/05/2024)   PRAPARE - Administrator, Civil Service (Medical): No    Lack of Transportation (Non-Medical): No  Physical Activity: Inactive (05/05/2024)   Exercise Vital Sign    Days of Exercise per  Week: 0 days    Minutes of Exercise per Session: 0 min  Stress: No Stress Concern Present (05/05/2024)   [employer] of Occupational Health - Occupational Stress Questionnaire    Feeling of Stress: Not at all  Social Connections: Moderately Isolated (05/05/2024)   Social Connection and Isolation Panel    Frequency of Communication with Friends and Family: Once a week    Frequency of Social Gatherings with Friends and Family: Once a week    Attends Religious Services: 1 to 4 times per year    Active Member of [employer] or Organizations: No    Attends Banker Meetings: Never    Marital Status: Married  Catering manager Violence: Not At Risk (05/05/2024)   Humiliation, Afraid, Rape, and Kick questionnaire    Fear of Current or Ex-Partner: No    Emotionally Abused: No    Physically Abused: No    Sexually Abused: No   Family History  Problem Relation Age of Onset   Alzheimer's disease Mother    Hyperlipidemia Mother    Hypertension Mother    Cancer Father    Alcohol abuse Father    Diabetes Daughter        Type 1   Diabetes Son    Colon cancer Maternal Grandmother    Arthritis Maternal Grandmother    Breast cancer Neg Hx    Celiac disease Neg Hx    Cirrhosis Neg Hx    Clotting disorder Neg Hx    Colitis Neg Hx    Colon polyps Neg Hx    Crohn's disease Neg Hx    Cystic fibrosis Neg Hx    Esophageal cancer Neg Hx    Heart disease Neg Hx    Hemochromatosis Neg Hx    Inflammatory bowel disease Neg Hx    Irritable bowel syndrome Neg Hx    Kidney disease Neg Hx    Liver cancer Neg Hx    Liver disease Neg Hx    Ovarian cancer Neg Hx    Pancreatic cancer Neg Hx    Prostate cancer Neg Hx    Rectal cancer Neg Hx    Stomach cancer Neg Hx    Ulcerative colitis Neg Hx    Uterine cancer Neg Hx    Wilson's disease Neg Hx     Objective: Office vital signs reviewed. BP 132/62   Pulse 70   Temp (!) 97.5 F (36.4 C)   Wt 120 lb 6.4 oz (54.6 kg)   SpO2 97%   BMI  26.05 kg/m   Physical Examination:  General: Awake, alert, tired but nontoxic-appearing female, No acute distress HEENT: Normal    Neck: No masses palpated. No lymphadenopathy    Ears: Tympanic membranes intact, normal light reflex, no erythema, no bulging    Eyes: PERRLA, extraocular membranes intact, sclera white  Nose: nasal turbinates moist, clear nasal discharge    Throat: moist mucus membranes, no erythema, no tonsillar exudate.  Airway is patent Cardio: regular rate and rhythm, S1S2 heard, no murmurs appreciated Pulm: Global decreased breath sounds with expiratory wheeze.  She is coughing intermittently.  Normal work of breathing on room air    Assessment/ Plan: 68 y.o. female   COPD with acute exacerbation (HCC) - Plan: doxycycline  (VIBRA -TABS) 100 MG tablet, predniSONE  (DELTASONE ) 20 MG tablet, promethazine -dextromethorphan (PROMETHAZINE -DM) 6.25-15 MG/5ML syrup, methylPREDNISolone  acetate (DEPO-MEDROL ) injection 40 mg, ipratropium-albuterol  (DUONEB) 0.5-2.5 (3) MG/3ML nebulizer solution 3 mL, DISCONTINUED: methylPREDNISolone  acetate (DEPO-MEDROL ) injection 40 mg  URI with cough and congestion - Plan: Novel Coronavirus, NAA ([facility]), Veritor Flu A/B Waived, ipratropium-albuterol  (DUONEB) 0.5-2.5 (3) MG/3ML nebulizer solution 3 mL  Hyperlipidemia associated with type 2 diabetes mellitus (HCC) - Plan: atorvastatin  (LIPITOR) 10 MG tablet   Suspect this is likely viral but given underlying COPD we will empirically treat with oral antibiotics and steroids as an exacerbation.  Testing for COVID.  Rapid flu negative.  She was given a nebulizer treatment here in office and this resolved her wheezes and improved air movement.  Home care instructions reviewed and reasons for reevaluation discussed  Additionally, she was amenable to restarting her Lipitor so this was ordered again today  Norene CHRISTELLA Fielding, DO [facility] [phone]

## 2024-07-14 NOTE — Progress Notes (Signed)
 06/27/24 Name: Lori Schroeder MRN: [id number] DOB: [birth year]  Chief Complaint  Patient presents with   Diabetes    Lori Schroeder is a 68 y.o. year old female who presented for a telephone visit.   They were referred to the pharmacist by their PCP for assistance in managing diabetes and medication access.    Subjective:  Care Team: Primary Care Provider: Jolinda Norene HERO, DO    Medication Access/Adherence  Current Pharmacy:  [facility] [place], [place] - [address] [place] [address] Phone: [phone] Fax: [phone]  [facility] - [place], [place] - [address] [place] [address] [place] [address] Phone: [phone] Fax: [phone]   Patient reports affordability concerns with their medications: Yes  Patient reports access/transportation concerns to their pharmacy: No  Patient reports adherence concerns with their medications:  Yes  due to cost   Diabetes:  Current medications: Trulicity  1.5mg  weekly  Medications tried in the past: metformin , Januvia   Current glucose readings: FBG<130  Current meal patterns:  Discussed meal planning options and Plate method for healthy eating Avoid sugary drinks and desserts Incorporate balanced protein, non starchy veggies, 1 serving of carbohydrate with each meal Increase water intake Increase physical activity as able  Current physical activity: encouraged as able  Current medication access support: will enroll in the Lilly Cares PAP for Trulicity   Macrovascular and Microvascular Risk Reduction:  Statin? No;patient scheduled for pharmd f/u for HLD; ACEi/ARB? no Last urinary albumin/creatinine ratio:  Lab Results  Component Value Date   MICRALBCREAT 5 07/04/2024   MICRALBCREAT <10 05/09/2023   MICRALBCREAT <12 08/04/2022   MICRALBCREAT <8 03/29/2021   Last eye exam:  Lab Results  Component Value Date   HMDIABEYEEXA No Retinopathy 06/20/2023   Last foot  exam: 07/04/2024 Tobacco Use:  Tobacco Use: High Risk (07/04/2024)   Patient History    Smoking Tobacco Use: Every Day    Smokeless Tobacco Use: Never    Passive Exposure: Not on file     Objective:  Lab Results  Component Value Date   HGBA1C 6.2 (H) 07/04/2024    Lab Results  Component Value Date   CREATININE 0.75 07/04/2024   BUN 8 07/04/2024   NA 139 07/04/2024   K 4.7 07/04/2024   CL 101 07/04/2024   CO2 23 07/04/2024    Lab Results  Component Value Date   CHOL 259 (H) 07/04/2024   HDL 54 07/04/2024   LDLCALC 179 (H) 07/04/2024   TRIG 142 07/04/2024   CHOLHDL 4.8 (H) 07/04/2024    Medications Reviewed Today     Reviewed by Lori Schroeder, [facility] (Pharmacist) on 07/14/24 at 0721  Med List Status: <None>   Medication Order Taking? Sig Documenting Provider Last Dose Status Informant  Albuterol -Budesonide (AIRSUPRA) 90-80 MCG/ACT AERO [id number]  Inhale 2 puffs into the lungs every 4 (four) hours as needed. Lori Norene HERO, DO  Active            Med Note (Lori Schroeder   Tue Jul 08, 2024  2:49 PM) Via [place]   budesonide-glycopyrrolate-formoterol (BREZTRI  AEROSPHERE) 160-9-4.8 MCG/ACT AERO inhaler [id number]  Inhale 2 puffs into the lungs 2 (two) times daily. Lori Norene HERO, DO  Active            Med Note Lori Schroeder   Tue Jul 08, 2024  2:48 PM) Via [place] patient [place]  Dulaglutide  (TRULICITY ) 1.5 MG/0.5ML SOAJ [id number]  Inject 1.5 mg into the skin once a week. INJECT 1.5MG  ONCE A WEEK Gottschalk, Lori M, DO  Active   esomeprazole  (NEXIUM ) 40 MG capsule [id number]  Take 1 capsule (40 mg total) by mouth 2 (two) times daily before a meal. Gottschalk, Lori M, DO  Active   nicotine  (NICODERM CQ  - DOSED IN MG/24 HOURS) 14 mg/24hr patch [id number]  Place 1 patch (14 mg total) onto the skin daily. Monht #2  Patient not taking: Reported on 07/04/2024   Lori Norene HERO, DO  Active            Med Note Lori, Tanasia Budzinski Schroeder    Wed Jul 09, 2024  3:00 PM) Recommended [place] quit line--free patches/gum  nicotine  (NICODERM CQ  - DOSED IN MG/24 HOURS) 21 mg/24hr patch [id number]  Place 1 patch (21 mg total) onto the skin daily. Month #1  Patient not taking: Reported on 07/04/2024   Lori Norene M, DO  Active   nicotine  (NICODERM CQ  - DOSED IN MG/24 HR) 7 mg/24hr patch [id number]  Place 1 patch (7 mg total) onto the skin daily. Month #3  Patient not taking: Reported on 07/04/2024   Lori Norene M, DO  Active              Assessment/Plan:   Diabetes: - Currently controlled; goal A1c <7%. Cardiorenal risk reduction is opportunities for improvement.. Blood pressure is at goal <130/80. LDL is not at goal.  - Reviewed long term cardiovascular and renal outcomes of uncontrolled blood sugar. and Reviewed goal A1c, goal fasting, and goal 2 hour post prandial glucose. Recommended to check glucose fasting or if symptomatic - Recommend to continue Trulicity , will need to enroll in the Lilly Cares PAP; still has a few weeks of Trulicity  remaining. - Patient denies personal or family history of multiple endocrine neoplasia type 2, medullary thyroid  cancer; personal history of pancreatitis or gallbladder disease., Discussed side effects of gastrointestinal upset/nausea; eating smaller meals, avoiding high-fat foods, and remaining upright after eating may reduce nausea. Discussed that overeating is a major trigger of nausea with this class of medications, as often times patients will start to feel full sooner and may need to decrease portion sizes from what they were previously accustomed to.   Follow Up Plan: see PHarmD for HLD; to be scheduled in 4 weeks  Lori Schroeder, PharmD, BCACP, CPP Clinical Pharmacist, [facility]

## 2024-07-14 NOTE — Progress Notes (Deleted)
 j

## 2024-07-14 NOTE — Telephone Encounter (Signed)
 Appt made.

## 2024-07-14 NOTE — Telephone Encounter (Signed)
 FYI Only or Action Required?: FYI only for provider.  Patient was last seen in primary care on 07/04/2024 by Jolinda Norene HERO, DO.  Called Nurse Triage reporting Fever, chest tightness, cough  Symptoms began several days ago.  Interventions attempted: Rest, hydration, or home remedies.  Symptoms are: gradually worsening.  Triage Disposition: See HCP Within 4 Hours (Or PCP Triage)  Patient/caregiver understands and will follow disposition?: Yes    Copied from CRM [id number]. Topic: Clinical - Red Word Triage >> Jul 14, 2024  7:50 AM Donna BRAVO wrote: Red Word that prompted transfer to Nurse Triage: patient, Symptoms Congestion  Can't take a deep breath - chest tightness Yellow mucus Head and ear pressure and pain Reason for Disposition  [1] Fever > 101 F (38.3 C) AND [2] age > 60 years  Answer Assessment - Initial Assessment Questions Patient is having to use both inhalers  1. TEMPERATURE: What is the most recent temperature?  How was it measured?      101 last night, 100.2 this morning  2. ONSET: When did the fever start?      Friday night 3. CHILLS: Do you have chills? If yes: How bad are they?  (e.g., none, mild, moderate, severe)     Yes 4. OTHER SYMPTOMS: Do you have any other symptoms besides the fever?  (e.g., abdomen pain, cough, diarrhea, earache, headache, sore throat, urination pain)     Chest congestion, chest tightness, yellow mucus with cough, head and ear pressure, pain, feeling like she is going to throw up 5. CAUSE: If there are no symptoms, ask: What do you think is causing the fever?      Patient is unsure 6. CONTACTS: Does anyone else in the family have an infection?     No 7. TREATMENT: What have you done so far to treat this fever? (e.g., OTC fever medicines)      No OTC medication  8. IMMUNOCOMPROMISE: Do you have any of the following: diabetes, HIV positive, splenectomy, cancer chemotherapy, chronic steroid treatment, transplant  patient, etc.?     Diabetes type 2  Protocols used: Fever-A-AH

## 2024-07-15 LAB — NOVEL CORONAVIRUS, NAA: SARS-CoV-2, NAA: NOT DETECTED

## 2024-07-16 ENCOUNTER — Ambulatory Visit: Payer: Self-pay | Admitting: Family Medicine

## 2024-07-21 ENCOUNTER — Ambulatory Visit (INDEPENDENT_AMBULATORY_CARE_PROVIDER_SITE_OTHER): Admitting: Family Medicine

## 2024-07-21 ENCOUNTER — Ambulatory Visit: Payer: Self-pay

## 2024-07-21 ENCOUNTER — Encounter: Payer: Self-pay | Admitting: Family Medicine

## 2024-07-21 VITALS — BP 140/77 | HR 93 | Temp 97.3°F | Ht <= 58 in | Wt 119.5 lb

## 2024-07-21 DIAGNOSIS — J441 Chronic obstructive pulmonary disease with (acute) exacerbation: Secondary | ICD-10-CM | POA: Diagnosis not present

## 2024-07-21 MED ORDER — GUAIFENESIN-CODEINE 100-10 MG/5ML PO SOLN: 5.0000 mL | Freq: Four times a day (QID) | ORAL | 0 refills | Status: AC | PRN

## 2024-07-21 MED ORDER — AZELASTINE HCL 0.1 % NA SOLN: 1.0000 | Freq: Two times a day (BID) | NASAL | 1 refills | Status: DC | PRN

## 2024-07-21 NOTE — Progress Notes (Signed)
 Subjective: CC: URI PCP: Jolinda Lori HERO, DO YEP:Lori Schroeder is a 68 y.o. female presenting to clinic today for:  Patient was seen on the 13th for COPD with acute exacerbation.  She was given doxycycline , prednisone  and cough syrup at that visit.  She notes that the cough syrup really is not helping.  She continues to have bilateral ear pain left greater than right, sinus headache, rhinorrhea and chills.  She is been having some left-sided rib pain that she thinks is from coughing.  Does not report any hemoptysis.  Continues to take her Breztri  and Airsupra with last use this morning.   ROS: Per HPI  Allergies  Allergen Reactions   Hydroxychloroquine  Itching and Rash    Diffuse drug rash   Metformin  And Related Nausea Only    GI intolerance    Sulfa  Antibiotics    Past Medical History:  Diagnosis Date   Arthritis    Blood transfusion without reported diagnosis    COPD (chronic obstructive pulmonary disease) (HCC)    Diabetes mellitus without complication (HCC)    External hemorrhoids without mention of complication 04/22/2002   Colonoscopy   Family history of malignant neoplasm of gastrointestinal tract    Family history of malignant neoplasm of gastrointestinal tract    GERD (gastroesophageal reflux disease)    Hyperlipemia    Hypertension     Current Outpatient Medications:    Albuterol -Budesonide (AIRSUPRA) 90-80 MCG/ACT AERO, Inhale 2 puffs into the lungs every 4 (four) hours as needed., Disp: 32.1 g, Rfl: 5   atorvastatin  (LIPITOR) 10 MG tablet, Take 1 tablet (10 mg total) by mouth daily., Disp: 90 tablet, Rfl: 3   budesonide-glycopyrrolate-formoterol (BREZTRI  AEROSPHERE) 160-9-4.8 MCG/ACT AERO inhaler, Inhale 2 puffs into the lungs 2 (two) times daily., Disp: 32.1 g, Rfl: 5   Dulaglutide  (TRULICITY ) 1.5 MG/0.5ML SOAJ, Inject 1.5 mg into the skin once a week. INJECT 1.5MG  ONCE A WEEK, Disp: 6 mL, Rfl: 3   esomeprazole  (NEXIUM ) 40 MG capsule, Take 1 capsule (40 mg  total) by mouth 2 (two) times daily before a meal., Disp: 180 capsule, Rfl: 3   promethazine -dextromethorphan (PROMETHAZINE -DM) 6.25-15 MG/5ML syrup, Take 2.5 mLs by mouth 4 (four) times daily as needed for cough., Disp: 118 mL, Rfl: 0   doxycycline  (VIBRA -TABS) 100 MG tablet, Take 1 tablet (100 mg total) by mouth 2 (two) times daily for 7 days. (Patient not taking: Reported on 07/21/2024), Disp: 14 tablet, Rfl: 0   nicotine  (NICODERM CQ  - DOSED IN MG/24 HOURS) 14 mg/24hr patch, Place 1 patch (14 mg total) onto the skin daily. Monht #2 (Patient not taking: Reported on 07/21/2024), Disp: 28 patch, Rfl: 0   nicotine  (NICODERM CQ  - DOSED IN MG/24 HOURS) 21 mg/24hr patch, Place 1 patch (21 mg total) onto the skin daily. Month #1 (Patient not taking: Reported on 07/21/2024), Disp: 28 patch, Rfl: 0   nicotine  (NICODERM CQ  - DOSED IN MG/24 HR) 7 mg/24hr patch, Place 1 patch (7 mg total) onto the skin daily. Month #3 (Patient not taking: Reported on 07/21/2024), Disp: 28 patch, Rfl: 0 Social History   Socioeconomic History   Marital status: Married    Spouse name: Evalene   Number of children: 2   Years of education: 14   Highest education level: Associate degree: occupational, Scientist, product/process development, or vocational program  Occupational History   Occupation: disability    Employer: DISABLED  Tobacco Use   Smoking status: Every Day    Current packs/day: 0.75  Average packs/day: 0.8 packs/day for 28.0 years (21.0 ttl pk-yrs)    Types: Cigarettes   Smokeless tobacco: Never  Vaping Use   Vaping status: Never Used  Substance and Sexual Activity   Alcohol use: No   Drug use: No   Sexual activity: Not on file  Other Topics Concern   Not on file  Social History Narrative   Not on file   Social Drivers of Health   Financial Resource Strain: Low Risk  (05/05/2024)   Overall Financial Resource Strain (CARDIA)    Difficulty of Paying Living Expenses: Not hard at all  Food Insecurity: No Food Insecurity  (05/05/2024)   Hunger Vital Sign    Worried About Running Out of Food in the Last Year: Never true    Ran Out of Food in the Last Year: Never true  Transportation Needs: No Transportation Needs (05/05/2024)   PRAPARE - Administrator, Civil Service (Medical): No    Lack of Transportation (Non-Medical): No  Physical Activity: Inactive (05/05/2024)   Exercise Vital Sign    Days of Exercise per Week: 0 days    Minutes of Exercise per Session: 0 min  Stress: No Stress Concern Present (05/05/2024)   [employer] of Occupational Health - Occupational Stress Questionnaire    Feeling of Stress: Not at all  Social Connections: Moderately Isolated (05/05/2024)   Social Connection and Isolation Panel    Frequency of Communication with Friends and Family: Once a week    Frequency of Social Gatherings with Friends and Family: Once a week    Attends Religious Services: 1 to 4 times per year    Active Member of [employer] or Organizations: No    Attends Banker Meetings: Never    Marital Status: Married  Catering manager Violence: Not At Risk (05/05/2024)   Humiliation, Afraid, Rape, and Kick questionnaire    Fear of Current or Ex-Partner: No    Emotionally Abused: No    Physically Abused: No    Sexually Abused: No   Family History  Problem Relation Age of Onset   Alzheimer's disease Mother    Hyperlipidemia Mother    Hypertension Mother    Cancer Father    Alcohol abuse Father    Diabetes Daughter        Type 1   Diabetes Son    Colon cancer Maternal Grandmother    Arthritis Maternal Grandmother    Breast cancer Neg Hx    Celiac disease Neg Hx    Cirrhosis Neg Hx    Clotting disorder Neg Hx    Colitis Neg Hx    Colon polyps Neg Hx    Crohn's disease Neg Hx    Cystic fibrosis Neg Hx    Esophageal cancer Neg Hx    Heart disease Neg Hx    Hemochromatosis Neg Hx    Inflammatory bowel disease Neg Hx    Irritable bowel syndrome Neg Hx    Kidney disease Neg Hx    Liver  cancer Neg Hx    Liver disease Neg Hx    Ovarian cancer Neg Hx    Pancreatic cancer Neg Hx    Prostate cancer Neg Hx    Rectal cancer Neg Hx    Stomach cancer Neg Hx    Ulcerative colitis Neg Hx    Uterine cancer Neg Hx    Wilson's disease Neg Hx     Objective: Office vital signs reviewed. BP (!) 140/77   Pulse 93  Temp (!) 97.3 F (36.3 C)   Ht 4' 9 (1.448 m)   Wt 119 lb 8 oz (54.2 kg)   SpO2 96%   BMI 25.86 kg/m   Physical Examination:  General: Awake, alert, nontoxic female, No acute distress HEENT: Normal    Neck: No masses palpated. No lymphadenopathy    Ears: Tympanic membranes intact, left TM with desiccated appearance and decreased light reflex but no purulence behind eardrum and no erythema appreciated    Eyes: PERRLA, extraocular membranes intact, sclera white    Nose: nasal turbinates moist, clear nasal discharge    Throat: moist mucus membranes, no erythema,   Airway is patent Cardio: regular rate and rhythm, S1S2 heard, no murmurs appreciated Pulm: clear to auscultation bilaterally, no wheezes, rhonchi or rales; normal work of breathing on room air.  Intermittently coughing    Assessment/ Plan: 68 y.o. female   COPD with acute exacerbation (HCC) - Plan: DG Chest 2 View, guaiFENesin -codeine 100-10 MG/5ML syrup, azelastine (ASTELIN) 0.1 % nasal spray   Stat chest x-ray ordered.  COVID and flu testing were negative.  We do not have x-ray in-house today so patient will have done at [facility].  I changed her cough syrup to guaifenesin  with codeine.  Reiterated that this can cause sleepiness and she should not drive while taking.  Astelin nasal spray for rhinorrhea.   The Narcotic Database has been reviewed.  There were no red flags.    Lori CHRISTELLA Fielding, DO [facility] [phone]

## 2024-07-21 NOTE — Telephone Encounter (Signed)
 FYI Only or Action Required?: Action required by provider: request for appointment.  Patient was last seen in primary care on 07/14/2024 by Lori Norene HERO, DO.  Called Nurse Triage reporting Cough.  Symptoms began several weeks ago.  Interventions attempted: Prescription medications: has completed.  Symptoms are: unchanged.Seen 07/14/24 and no better.  Triage Disposition: See HCP Within 4 Hours (Or PCP Triage)  Patient/caregiver understands and will follow disposition?: Yes    Copied from CRM #[id number]. Topic: Clinical - Red Word Triage >> Jul 21, 2024  7:47 AM Rosaria BRAVO wrote: Red Word that prompted transfer to Nurse Triage: Difficulty breathing, a lot of congestion, and left ear pain.    ----------------------------------------------------------------------- From previous Reason for Contact - Scheduling: Patient/patient representative is calling to schedule an appointment. Refer to attachments for appointment information. Answer Assessment - Initial Assessment Questions 1. ONSET: When did the cough begin?      2 weeks ago 2. SEVERITY: How bad is the cough today?      severe 3. SPUTUM: Describe the color of your sputum (e.g., none, dry cough; clear, white, yellow, green)     yellow 4. HEMOPTYSIS: Are you coughing up any blood? If Yes, ask: How much? (e.g., flecks, streaks, tablespoons, etc.)     no 5. DIFFICULTY BREATHING: Are you having difficulty breathing? If Yes, ask: How bad is it? (e.g., mild, moderate, severe)      mild 6. FEVER: Do you have a fever? If Yes, ask: What is your temperature, how was it measured, and when did it start?     no 7. CARDIAC HISTORY: Do you have any history of heart disease? (e.g., heart attack, congestive heart failure)      NO 8. LUNG HISTORY: Do you have any history of lung disease?  (e.g., pulmonary embolus, asthma, emphysema)     COPD 9. PE RISK FACTORS: Do you have a history of blood clots? (or: recent  major surgery, recent prolonged travel, bedridden)     no 10. OTHER SYMPTOMS: Do you have any other symptoms? (e.g., runny nose, wheezing, chest pain)       Ear pain, wheezig 11. PREGNANCY: Is there any chance you are pregnant? When was your last menstrual period?       no 12. TRAVEL: Have you traveled out of the country in the last month? (e.g., travel history, exposures)       no  Protocols used: Cough - Acute Productive-A-AH  Reason for Disposition  [1] MILD difficulty breathing (e.g., minimal/no SOB at rest, SOB with walking, pulse < 100) AND [2] still present when not coughing  Answer Assessment - Initial Assessment Questions 1. ONSET: When did the cough begin?      2 weeks ago 2. SEVERITY: How bad is the cough today?      severe 3. SPUTUM: Describe the color of your sputum (e.g., none, dry cough; clear, white, yellow, green)     yellow 4. HEMOPTYSIS: Are you coughing up any blood? If Yes, ask: How much? (e.g., flecks, streaks, tablespoons, etc.)     no 5. DIFFICULTY BREATHING: Are you having difficulty breathing? If Yes, ask: How bad is it? (e.g., mild, moderate, severe)      mild 6. FEVER: Do you have a fever? If Yes, ask: What is your temperature, how was it measured, and when did it start?     no 7. CARDIAC HISTORY: Do you have any history of heart disease? (e.g., heart attack, congestive heart failure)  NO 8. LUNG HISTORY: Do you have any history of lung disease?  (e.g., pulmonary embolus, asthma, emphysema)     COPD 9. PE RISK FACTORS: Do you have a history of blood clots? (or: recent major surgery, recent prolonged travel, bedridden)     no 10. OTHER SYMPTOMS: Do you have any other symptoms? (e.g., runny nose, wheezing, chest pain)       Ear pain, wheezig 11. PREGNANCY: Is there any chance you are pregnant? When was your last menstrual period?       no 12. TRAVEL: Have you traveled out of the country in the last month?  (e.g., travel history, exposures)       no  Protocols used: Cough - Acute Productive-A-AH

## 2024-07-21 NOTE — Telephone Encounter (Signed)
 Appt made.

## 2024-07-22 ENCOUNTER — Ambulatory Visit (HOSPITAL_COMMUNITY)
Admission: RE | Admit: 2024-07-22 | Discharge: 2024-07-22 | Disposition: A | Discharge status: Home / Self Care | Source: Ambulatory Visit | Attending: Family Medicine | Admitting: Family Medicine

## 2024-07-22 ENCOUNTER — Ambulatory Visit: Payer: Self-pay | Admitting: Family Medicine

## 2024-07-22 DIAGNOSIS — J449 Chronic obstructive pulmonary disease, unspecified: Secondary | ICD-10-CM | POA: Diagnosis not present

## 2024-07-22 DIAGNOSIS — J441 Chronic obstructive pulmonary disease with (acute) exacerbation: Secondary | ICD-10-CM | POA: Insufficient documentation

## 2024-07-22 DIAGNOSIS — R053 Chronic cough: Secondary | ICD-10-CM | POA: Diagnosis not present

## 2024-07-22 DIAGNOSIS — R0602 Shortness of breath: Secondary | ICD-10-CM | POA: Diagnosis not present

## 2024-07-22 DIAGNOSIS — Z981 Arthrodesis status: Secondary | ICD-10-CM | POA: Diagnosis not present

## 2024-07-23 NOTE — Progress Notes (Signed)
 [facility] - Clinic Note  07/25/2024   CHIEF COMPLAINT Patient presents for Retina Follow Up  HISTORY OF PRESENT ILLNESS: Lori Schroeder is a 68 y.o. female who presents to the clinic today for:  HPI     Retina Follow Up   Patient presents with  Other.  In right eye.  This started 6 weeks ago.  I, the attending physician,  performed the HPI with the patient and updated documentation appropriately.        Comments   Patient here for 6 weeks retina follow up for VMT OD. Patient states vision not any better. Can't tell any difference. Has some eye pain OS. Getting over a respiratory infection.       Last edited by Valdemar Rogue, MD on 07/31/2024  2:46 PM.    Patient states the vision is getting worse, she feels the vision is blurrier.   Referring physician: Jolinda Norene HERO, DO [address] [place],  [place] [address]  HISTORICAL INFORMATION:  Selected notes from the MEDICAL RECORD NUMBER Referred by Dr. Jolinda for DM exam anddecreased vision OD LEE:  Ocular Hx- PMH-   CURRENT MEDICATIONS: No current outpatient medications on file. (Ophthalmic Drugs)   No current facility-administered medications for this visit. (Ophthalmic Drugs)   Current Outpatient Medications (Other)  Medication Sig   Albuterol -Budesonide (AIRSUPRA) 90-80 MCG/ACT AERO Inhale 2 puffs into the lungs every 4 (four) hours as needed.   atorvastatin  (LIPITOR) 10 MG tablet Take 1 tablet (10 mg total) by mouth daily.   azelastine (ASTELIN) 0.1 % nasal spray Place 1 spray into both nostrils 2 (two) times daily as needed for rhinitis.   budesonide-glycopyrrolate-formoterol (BREZTRI  AEROSPHERE) 160-9-4.8 MCG/ACT AERO inhaler Inhale 2 puffs into the lungs 2 (two) times daily.   Dulaglutide  (TRULICITY ) 1.5 MG/0.5ML SOAJ Inject 1.5 mg into the skin once a week. INJECT 1.5MG  ONCE A WEEK   esomeprazole  (NEXIUM ) 40 MG capsule Take 1 capsule (40 mg total) by mouth 2 (two) times daily before a  meal.   guaiFENesin -codeine 100-10 MG/5ML syrup Take 5 mLs by mouth every 6 (six) hours as needed for cough.   promethazine -dextromethorphan (PROMETHAZINE -DM) 6.25-15 MG/5ML syrup Take 2.5 mLs by mouth 4 (four) times daily as needed for cough.   nicotine  (NICODERM CQ  - DOSED IN MG/24 HOURS) 14 mg/24hr patch Place 1 patch (14 mg total) onto the skin daily. Monht #2 (Patient not taking: Reported on 07/25/2024)   nicotine  (NICODERM CQ  - DOSED IN MG/24 HOURS) 21 mg/24hr patch Place 1 patch (21 mg total) onto the skin daily. Month #1 (Patient not taking: Reported on 07/25/2024)   nicotine  (NICODERM CQ  - DOSED IN MG/24 HR) 7 mg/24hr patch Place 1 patch (7 mg total) onto the skin daily. Month #3 (Patient not taking: Reported on 07/25/2024)   No current facility-administered medications for this visit. (Other)   REVIEW OF SYSTEMS: ROS   Positive for: Musculoskeletal, Endocrine, Eyes, Respiratory Last edited by Orval Asberry RAMAN, COA on 07/25/2024  9:34 AM.      ALLERGIES Allergies  Allergen Reactions   Hydroxychloroquine  Itching and Rash    Diffuse drug rash   Metformin  And Related Nausea Only    GI intolerance    Sulfa  Antibiotics    PAST MEDICAL HISTORY Past Medical History:  Diagnosis Date   Arthritis    Blood transfusion without reported diagnosis    COPD (chronic obstructive pulmonary disease) (HCC)    Diabetes mellitus without complication (HCC)  External hemorrhoids without mention of complication 04/22/2002   Colonoscopy   Family history of malignant neoplasm of gastrointestinal tract    Family history of malignant neoplasm of gastrointestinal tract    GERD (gastroesophageal reflux disease)    Hyperlipemia    Hypertension    Past Surgical History:  Procedure Laterality Date   ABDOMINAL HYSTERECTOMY     BACK SURGERY     BLADDER SURGERY     Bladder Tact   CARPAL TUNNEL RELEASE Bilateral    COLONOSCOPY     COLONOSCOPY [name] 03/13/2024   Procedure: COLONOSCOPY;  Surgeon:  Shaaron Lamar HERO, MD;  Location: [facility];  Service: Endoscopy;  Laterality: [name];  1245pm, asa 2   FINGER SURGERY Right    Small finger fracture surgery   NECK SURGERY     FAMILY HISTORY Family History  Problem Relation Age of Onset   Alzheimer's disease Mother    Hyperlipidemia Mother    Hypertension Mother    Cancer Father    Alcohol abuse Father    Diabetes Daughter        Type 1   Diabetes Son    Colon cancer Maternal Grandmother    Arthritis Maternal Grandmother    Breast cancer Neg Hx    Celiac disease Neg Hx    Cirrhosis Neg Hx    Clotting disorder Neg Hx    Colitis Neg Hx    Colon polyps Neg Hx    Crohn's disease Neg Hx    Cystic fibrosis Neg Hx    Esophageal cancer Neg Hx    Heart disease Neg Hx    Hemochromatosis Neg Hx    Inflammatory bowel disease Neg Hx    Irritable bowel syndrome Neg Hx    Kidney disease Neg Hx    Liver cancer Neg Hx    Liver disease Neg Hx    Ovarian cancer Neg Hx    Pancreatic cancer Neg Hx    Prostate cancer Neg Hx    Rectal cancer Neg Hx    Stomach cancer Neg Hx    Ulcerative colitis Neg Hx    Uterine cancer Neg Hx    Wilson's disease Neg Hx    SOCIAL HISTORY Social History   Tobacco Use   Smoking status: Every Day    Current packs/day: 0.75    Average packs/day: 0.8 packs/day for 28.0 years (21.0 ttl pk-yrs)    Types: Cigarettes   Smokeless tobacco: Never  Vaping Use   Vaping status: Never Used  Substance Use Topics   Alcohol use: No   Drug use: No       OPHTHALMIC EXAM:  Base Eye Exam     Visual Acuity (Snellen - Linear)       Right Left   Dist cc 20/150 +2 20/25 -1   Dist ph cc 20/80 -2     Correction: Glasses         Tonometry (Tonopen, 9:32 AM)       Right Left   Pressure 19 17         Pupils       Dark Light Shape React APD   Right 3 2 Round Brisk None   Left 3 2 Round Brisk None         Visual Fields (Counting fingers)       Left Right    Full Full         Extraocular  Movement       Right Left  Full, Ortho Full, Ortho         Neuro/Psych     Oriented x3: Yes   Mood/Affect: Normal         Dilation     Both eyes: 1.0% Mydriacyl, 2.5% Phenylephrine @ 9:32 AM           Slit Lamp and Fundus Exam     External Exam       Right Left   External Normal Normal         Slit Lamp Exam       Right Left   Lids/Lashes Dermatochalasis - upper lid Dermatochalasis - upper lid   Conjunctiva/Sclera White and quiet White and quiet   Cornea 1+ Punctate epithelial erosions, Debris in tear film 1+ inferior Punctate epithelial erosions, Debris in tear film   Anterior Chamber Deep and quiet, Deep, narrow temporal angle Deep and quiet, Deep, narrow temporal angle   Iris Round and dilated, No NVI Round and dilated, No NVI   Lens 2-3+ Nuclear sclerosis brunescence, 2-3+ Cortical cataract 2-3+ Nuclear sclerosis brunescence, 2-3+ Cortical cataract   Anterior Vitreous Vitreous syneresis Vitreous syneresis         Fundus Exam       Right Left   Disc Pink and sharp Pink and sharp, Compact, temp PPP   C/D Ratio 0.2 0.2   Macula Flat, Blunted foveal reflex, central cyst- slightly improved, no heme Flat, central cyst, Good foveal reflex, No heme or edema   Vessels Vascular attenuation, Tortuous Vascular attenuation, Tortuous   Periphery Attached, Reticular degeneration, No heme Attached, Reticular degeneration, No heme           Refraction     Wearing Rx       Sphere Cylinder Axis Add   Right +0.25 +2.00 176 +2.25   Left +0.75 +1.00 005 +2.25           IMAGING AND PROCEDURES  Imaging and Procedures for 07/25/2024  OCT, Retina - OU - Both Eyes       Right Eye Quality was good. Central Foveal Thickness: 321. Progression has improved. Findings include no SRF, vitreous traction, vitreomacular adhesion (Central VMT w/ mild interval improvement in cystic changes-- ? VMT releasing).   Left Eye Quality was good. Central Foveal Thickness:  271. Progression has been stable. Findings include normal foveal contour, no IRF, no SRF, vitreomacular adhesion (No DME).   Notes *Images captured and stored on drive  Diagnosis / Impression:  OD: Central VMT w/ mild interval improvement in cystic changes--  ?VMT releasing OS: no DME  Clinical management:  See below  Abbreviations: NFP - Normal foveal profile. CME - cystoid macular edema. PED - pigment epithelial detachment. IRF - intraretinal fluid. SRF - subretinal fluid. EZ - ellipsoid zone. ERM - epiretinal membrane. ORA - outer retinal atrophy. ORT - outer retinal tubulation. SRHM - subretinal hyper-reflective material. IRHM - intraretinal hyper-reflective material            ASSESSMENT/PLAN:   ICD-10-CM   1. 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 Diabetes mellitus without complication (HCC)  E11.9     3. Diabetes mellitus treated with injections of non-insulin medication (HCC)  E11.9    Z79.85     4. Hypertensive retinopathy of both eyes  H35.033     5. Essential hypertension  I10     6. Combined forms of age-related cataract of both eyes  H25.813  7. Dry eyes, bilateral  H04.123      Vitreomacular traction w/ impending macular hole, OD - OCT shows OD: Central VMT w/ mild interval improvement in cystic changes--  ?VMT releasing - BCVA OD 20/80 - stable - discussed findings, prognosis and possible treatment options - recommend monitoring -- will f/u a little sooner - f/u in 6 weeks, sooner prn - DFE, OCT  2,3 Diabetes mellitus, type 2 without retinopathy  -A1C 6.2 (10.03.25), 6.3 (02.18.25) - The incidence, risk factors for progression, natural history and treatment options for diabetic retinopathy were discussed with patient.   - The need for close monitoring of blood glucose, blood pressure, and serum lipids, avoiding cigarette or any type of tobacco, and the need for long term follow up was also discussed with  patient. - f/u in 1 year, sooner prn    4,5. Hypertensive retinopathy OU - discussed importance of tight BP control - monitor   6. Mixed Cataract OU - The symptoms of cataract, surgical options, and treatments and risks were discussed with patient. - discussed diagnosis and progression - monitor   7. Dry eyes OU - recommend artificial tears and lubricating ointment as needed   Ophthalmic Meds Ordered this visit:  No orders of the defined types were placed in this encounter.    Return in about 6 weeks (around 09/05/2024) for f/u VMT, OD, DFE, OCT.  There are no Patient Instructions on file for this visit.  This document serves as a record of services personally performed by Redell JUDITHANN Hans, MD, PhD. It was created on their behalf by Delon Newness COT, an ophthalmic technician. The creation of this record is the provider's dictation and/or activities during the visit.    Electronically signed by: Delon Newness COT 10.22.25  2:47 PM  This document serves as a record of services personally performed by Redell JUDITHANN Hans, MD, PhD. It was created on their behalf by Wanda GEANNIE Keens, COT an ophthalmic technician. The creation of this record is the provider's dictation and/or activities during the visit.    Electronically signed by:  Wanda GEANNIE Keens, COT  07/31/24 2:47 PM  Redell JUDITHANN Hans, M.D., Ph.D. Diseases & Surgery of the Retina and Vitreous [facility] 07/25/2024   I have reviewed the above documentation for accuracy and completeness, and I agree with the above. Redell JUDITHANN Hans, M.D., Ph.D. 07/31/24 2:48 PM   Abbreviations: M myopia (nearsighted); A astigmatism; H hyperopia (farsighted); P presbyopia; Mrx spectacle prescription;  CTL contact lenses; OD right eye; OS left eye; OU both eyes  XT exotropia; ET esotropia; PEK punctate epithelial keratitis; PEE punctate epithelial erosions; DES dry eye syndrome; MGD meibomian gland dysfunction;  ATs artificial tears; PFAT's preservative free artificial tears; NSC nuclear sclerotic cataract; PSC posterior subcapsular cataract; ERM epi-retinal membrane; PVD posterior vitreous detachment; RD retinal detachment; DM diabetes mellitus; DR diabetic retinopathy; NPDR non-proliferative diabetic retinopathy; PDR proliferative diabetic retinopathy; CSME clinically significant macular edema; DME diabetic macular edema; dbh dot blot hemorrhages; CWS cotton wool spot; POAG primary open angle glaucoma; C/D cup-to-disc ratio; HVF humphrey visual field; GVF goldmann visual field; OCT optical coherence tomography; IOP intraocular pressure; BRVO Branch retinal vein occlusion; CRVO central retinal vein occlusion; CRAO central retinal artery occlusion; BRAO branch retinal artery occlusion; RT retinal tear; SB scleral buckle; PPV pars plana vitrectomy; VH Vitreous hemorrhage; PRP panretinal laser photocoagulation; IVK intravitreal kenalog ; VMT vitreomacular traction; MH Macular hole;  NVD neovascularization of the disc; NVE neovascularization elsewhere; AREDS  age related eye disease study; ARMD age related macular degeneration; POAG primary open angle glaucoma; EBMD epithelial/anterior basement membrane dystrophy; ACIOL anterior chamber intraocular lens; IOL intraocular lens; PCIOL posterior chamber intraocular lens; Phaco/IOL phacoemulsification with intraocular lens placement; PRK photorefractive keratectomy; LASIK laser assisted in situ keratomileusis; HTN hypertension; DM diabetes mellitus; COPD chronic obstructive pulmonary disease

## 2024-07-25 ENCOUNTER — Encounter (INDEPENDENT_AMBULATORY_CARE_PROVIDER_SITE_OTHER): Payer: Self-pay | Admitting: Ophthalmology

## 2024-07-25 ENCOUNTER — Ambulatory Visit (INDEPENDENT_AMBULATORY_CARE_PROVIDER_SITE_OTHER): Admitting: Ophthalmology

## 2024-07-25 DIAGNOSIS — H04123 Dry eye syndrome of bilateral lacrimal glands: Secondary | ICD-10-CM

## 2024-07-25 DIAGNOSIS — E119 Type 2 diabetes mellitus without complications: Secondary | ICD-10-CM

## 2024-07-25 DIAGNOSIS — I10 Essential (primary) hypertension: Secondary | ICD-10-CM

## 2024-07-25 DIAGNOSIS — H35033 Hypertensive retinopathy, bilateral: Secondary | ICD-10-CM

## 2024-07-25 DIAGNOSIS — H25813 Combined forms of age-related cataract, bilateral: Secondary | ICD-10-CM | POA: Diagnosis not present

## 2024-07-25 DIAGNOSIS — H43821 Vitreomacular adhesion, right eye: Secondary | ICD-10-CM | POA: Diagnosis not present

## 2024-07-25 DIAGNOSIS — Z7985 Long-term (current) use of injectable non-insulin antidiabetic drugs: Secondary | ICD-10-CM | POA: Diagnosis not present

## 2024-07-31 ENCOUNTER — Encounter (INDEPENDENT_AMBULATORY_CARE_PROVIDER_SITE_OTHER): Payer: Self-pay | Admitting: Ophthalmology

## 2024-08-13 ENCOUNTER — Other Ambulatory Visit (INDEPENDENT_AMBULATORY_CARE_PROVIDER_SITE_OTHER): Payer: Self-pay

## 2024-08-13 DIAGNOSIS — E785 Hyperlipidemia, unspecified: Secondary | ICD-10-CM

## 2024-08-13 DIAGNOSIS — E1169 Type 2 diabetes mellitus with other specified complication: Secondary | ICD-10-CM

## 2024-08-13 NOTE — Progress Notes (Signed)
 08/13/2024 Name: Lori Schroeder MRN: [id number] DOB: 1956-09-22  Chief Complaint  Patient presents with   Hyperlipidemia    Lori Schroeder is a 68 y.o. year old female who presented for a telephone visit.   They were referred to the pharmacist by their PCP for assistance in managing medication access.    Subjective:  Care Team: Primary Care Provider: Jolinda Norene HERO, Schroeder   Medication Access/Adherence  Current Pharmacy:  [facility]ori Schro[place], [place] - [address] [place] [address] Phone: [phone] Fax: [phone]  [facility] - [place], [place] - [address] [place] [address] [place] [address] Phone: [phone] Fax: [phone]  Patient reports affordability concerns with their medications: Yes  Patient reports access/transportation concerns to their pharmacy: No  Patient reports adherence concerns with their medications:  Yes  due to cost   Hyperlipidemia/ASCVD Risk Reduction  Current lipid lowering medications: atorvastatin  10mg  Medications tried in the past: n/a  Antiplatelet regimen:   ASCVD History: CAD on imaging Family History:  Risk Factors: smoking, HTN, T2DM  Current physical activity: encouraged as able   Objective:  Lab Results  Component Value Date   HGBA1C 6.2 (H) 07/04/2024    Lab Results  Component Value Date   CREATININE 0.75 07/04/2024   BUN 8 07/04/2024   NA 139 07/04/2024   K 4.7 07/04/2024   CL 101 07/04/2024   CO2 23 07/04/2024    Lab Results  Component Value Date   CHOL 259 (H) 07/04/2024   HDL 54 07/04/2024   LDLCALC 179 (H) 07/04/2024   TRIG 142 07/04/2024   CHOLHDL 4.8 (H) 07/04/2024    Medications Reviewed Today     Reviewed by Lori Schroeder, [facility] (Pharmacist) on 08/31/24 at 1240  Med List Status: <None>   Medication Order Taking? Sig Documenting Provider Last Dose Status Informant  Albuterol -Budesonide (AIRSUPRA ) 90-80 MCG/ACT AERO [id number]  Inhale 2  puffs into the lungs every 4 (four) hours as needed. Lori Norene HERO, Schroeder  Active            Med Note Lori Schroeder, Lori Schroeder   Tue Jul 08, 2024  2:49 PM) Via [place] patient assistance program   atorvastatin  (LIPITOR) 10 MG tablet [id number]  Take 1 tablet (10 mg total) by mouth daily. Lori Schroeder  Active   azelastine  (ASTELIN ) 0.1 % nasal spray [id number]  Place 1 spray into both nostrils 2 (two) times daily as needed for rhinitis. Lori Schroeder  Active   budesonide-glycopyrrolate-formoterol (BREZTRI  AEROSPHERE) 160-9-4.8 MCG/ACT AERO inhaler [id number]  Inhale 2 puffs into the lungs 2 (two) times daily. Lori Norene HERO, Schroeder  Active            Med Note Lori Schroeder Lori Schroeder   Tue Jul 08, 2024  2:48 PM) Via [place] patient assistance program   Dulaglutide  (TRULICITY ) 1.5 MG/0.5ML EMMANUEL [id number]  Inject 1.5 mg into the skin once a week. INJECT 1.5MG  ONCE A WEEK Lori Schroeder  Active            Med Note Lori Schroeder, Rylan Kaufmann Schroeder   Sun Aug 31, 2024 12:40 PM) Via [employer] patient assistance program    esomeprazole  (NEXIUM ) 40 MG capsule [id number]  Take 1 capsule (40 mg total) by mouth 2 (two) times daily before a meal. Lori Schroeder  Active   guaiFENesin -codeine  100-10 MG/5ML syrup [id number]  Take 5 mLs  by mouth every 6 (six) hours as needed for cough. Lori Potter M, Schroeder  Active   nicotine  (NICODERM CQ  - DOSED IN MG/24 HOURS) 14 mg/24hr patch [id number]  Place 1 patch (14 mg total) onto the skin daily. Lori Schroeder  Patient not taking: Reported on 07/25/2024   Lori Potter HERO, Schroeder  Active            Med Note Lori Schroeder, Kanden Carey Schroeder   Wed Jul 09, 2024  3:00 PM) Recommended Lori Schroeder quit line--free patches/gum  nicotine  (NICODERM CQ  - DOSED IN MG/24 HOURS) 21 mg/24hr patch [id number]  Place 1 patch (21 mg total) onto the skin daily. Month #1  Patient not taking: Reported on 07/25/2024   Lori Potter M, Schroeder  Active   nicotine  (NICODERM CQ  - DOSED IN MG/24 HR) 7 mg/24hr patch [id number]   Place 1 patch (7 mg total) onto the skin daily. Month #3  Patient not taking: Reported on 07/25/2024   Lori Potter M, Schroeder  Active   promethazine -dextromethorphan (PROMETHAZINE -DM) 6.25-15 MG/5ML syrup [id number]  Take 2.5 mLs by mouth 4 (four) times daily as needed for cough. Lori Potter M, Schroeder  Active             Assessment/Plan:  -Continue atorvastatin , patient needs high intensity statin given LDL 179; patient to double up on statin (filled 90 day supply) to see if tolerated; will f/u to call in new RX -Consider Repatha     Follow Up Plan: 4 weeks.  Aariana Shankland Dattero Elandra Powell, PharmD, BCACP, CPP Clinical Pharmacist, [facility]

## 2024-08-22 NOTE — Telephone Encounter (Signed)
 PAP: Patient assistance application for AIRSUPRA  and Breztri  has been approved by PAP Companies: [employer] until 10/01/25.  Medication should be delivered to PAP Delivery: Home.   For further shipping updates, please contact [employer] ([employer]) at [phone].   Patient ID is: [id number]

## 2024-09-03 NOTE — Progress Notes (Shared)
 [facility] - Clinic Note  09/05/2024   CHIEF COMPLAINT Patient presents for No chief complaint on file.  HISTORY OF PRESENT ILLNESS: Lori Schroeder is a 68 y.o. female who presents to the clinic today for:   Patient states the vision is getting worse, she feels the vision is blurrier.   Referring physician: Jolinda Norene HERO, DO [address] [place],  [place] [address]  HISTORICAL INFORMATION:  Selected notes from the MEDICAL RECORD NUMBER Referred by Dr. Jolinda for DM exam anddecreased vision OD LEE:  Ocular Hx- PMH-   CURRENT MEDICATIONS: No current outpatient medications on file. (Ophthalmic Drugs)   No current facility-administered medications for this visit. (Ophthalmic Drugs)   Current Outpatient Medications (Other)  Medication Sig   Albuterol -Budesonide (AIRSUPRA ) 90-80 MCG/ACT AERO Inhale 2 puffs into the lungs every 4 (four) hours as needed.   atorvastatin  (LIPITOR) 10 MG tablet Take 1 tablet (10 mg total) by mouth daily.   azelastine  (ASTELIN ) 0.1 % nasal spray Place 1 spray into both nostrils 2 (two) times daily as needed for rhinitis.   budesonide-glycopyrrolate-formoterol (BREZTRI  AEROSPHERE) 160-9-4.8 MCG/ACT AERO inhaler Inhale 2 puffs into the lungs 2 (two) times daily.   Dulaglutide  (TRULICITY ) 1.5 MG/0.5ML SOAJ Inject 1.5 mg into the skin once a week. INJECT 1.5MG  ONCE A WEEK   esomeprazole  (NEXIUM ) 40 MG capsule Take 1 capsule (40 mg total) by mouth 2 (two) times daily before a meal.   guaiFENesin -codeine  100-10 MG/5ML syrup Take 5 mLs by mouth every 6 (six) hours as needed for cough.   nicotine  (NICODERM CQ  - DOSED IN MG/24 HOURS) 14 mg/24hr patch Place 1 patch (14 mg total) onto the skin daily. Monht #2 (Patient not taking: Reported on 07/25/2024)   nicotine  (NICODERM CQ  - DOSED IN MG/24 HOURS) 21 mg/24hr patch Place 1 patch (21 mg total) onto the skin daily. Month #1 (Patient not taking: Reported on 07/25/2024)   nicotine  (NICODERM CQ  -  DOSED IN MG/24 HR) 7 mg/24hr patch Place 1 patch (7 mg total) onto the skin daily. Month #3 (Patient not taking: Reported on 07/25/2024)   promethazine -dextromethorphan (PROMETHAZINE -DM) 6.25-15 MG/5ML syrup Take 2.5 mLs by mouth 4 (four) times daily as needed for cough.   No current facility-administered medications for this visit. (Other)   REVIEW OF SYSTEMS:    ALLERGIES Allergies  Allergen Reactions   Hydroxychloroquine  Itching and Rash    Diffuse drug rash   Metformin  And Related Nausea Only    GI intolerance    Sulfa  Antibiotics    PAST MEDICAL HISTORY Past Medical History:  Diagnosis Date   Arthritis    Blood transfusion without reported diagnosis    COPD (chronic obstructive pulmonary disease) (HCC)    Diabetes mellitus without complication (HCC)    External hemorrhoids without mention of complication 04/22/2002   Colonoscopy   Family history of malignant neoplasm of gastrointestinal tract    Family history of malignant neoplasm of gastrointestinal tract    GERD (gastroesophageal reflux disease)    Hyperlipemia    Hypertension    Past Surgical History:  Procedure Laterality Date   ABDOMINAL HYSTERECTOMY     BACK SURGERY     BLADDER SURGERY     Bladder Tact   CARPAL TUNNEL RELEASE Bilateral    COLONOSCOPY     COLONOSCOPY [name] 03/13/2024   Procedure: COLONOSCOPY;  Surgeon: Shaaron Lamar HERO, MD;  Location: [facility];  Service: Endoscopy;  Laterality: [name];  1245pm, asa 2  FINGER SURGERY Right    Small finger fracture surgery   NECK SURGERY     FAMILY HISTORY Family History  Problem Relation Age of Onset   Alzheimer's disease Mother    Hyperlipidemia Mother    Hypertension Mother    Cancer Father    Alcohol abuse Father    Diabetes Daughter        Type 1   Diabetes Son    Colon cancer Maternal Grandmother    Arthritis Maternal Grandmother    Breast cancer Neg Hx    Celiac disease Neg Hx    Cirrhosis Neg Hx    Clotting disorder Neg Hx    Colitis  Neg Hx    Colon polyps Neg Hx    Crohn's disease Neg Hx    Cystic fibrosis Neg Hx    Esophageal cancer Neg Hx    Heart disease Neg Hx    Hemochromatosis Neg Hx    Inflammatory bowel disease Neg Hx    Irritable bowel syndrome Neg Hx    Kidney disease Neg Hx    Liver cancer Neg Hx    Liver disease Neg Hx    Ovarian cancer Neg Hx    Pancreatic cancer Neg Hx    Prostate cancer Neg Hx    Rectal cancer Neg Hx    Stomach cancer Neg Hx    Ulcerative colitis Neg Hx    Uterine cancer Neg Hx    Wilson's disease Neg Hx    SOCIAL HISTORY Social History   Tobacco Use   Smoking status: Every Day    Current packs/day: 0.75    Average packs/day: 0.8 packs/day for 28.0 years (21.0 ttl pk-yrs)    Types: Cigarettes   Smokeless tobacco: Never  Vaping Use   Vaping status: Never Used  Substance Use Topics   Alcohol use: No   Drug use: No       OPHTHALMIC EXAM:  Not recorded    IMAGING AND PROCEDURES  Imaging and Procedures for 09/05/2024          ASSESSMENT/PLAN: No diagnosis found.  Vitreomacular traction w/ impending macular hole, OD - OCT shows OD: Central VMT w/ mild interval improvement in cystic changes--  ?VMT releasing - BCVA OD 20/80 - stable - discussed findings, prognosis and possible treatment options - recommend monitoring -- will f/u a little sooner - f/u in 6 weeks, sooner prn - DFE, OCT  2,3 Diabetes mellitus, type 2 without retinopathy  -A1C 6.2 (10.03.25), 6.3 (02.18.25) - The incidence, risk factors for progression, natural history and treatment options for diabetic retinopathy were discussed with patient.   - The need for close monitoring of blood glucose, blood pressure, and serum lipids, avoiding cigarette or any type of tobacco, and the need for long term follow up was also discussed with patient. - f/u in 1 year, sooner prn    4,5. Hypertensive retinopathy OU - discussed importance of tight BP control - monitor   6. Mixed Cataract OU - The  symptoms of cataract, surgical options, and treatments and risks were discussed with patient. - discussed diagnosis and progression - monitor   7. Dry eyes OU - recommend artificial tears and lubricating ointment as needed   Ophthalmic Meds Ordered this visit:  No orders of the defined types were placed in this encounter.    No follow-ups on file.  There are no Patient Instructions on file for this visit.  This document serves as a record of services personally performed by  Redell JUDITHANN Hans, MD, PhD. It was created on their behalf by Delon Newness COT, an ophthalmic technician. The creation of this record is the provider's dictation and/or activities during the visit.    Electronically signed by: Delon Newness COT 12.03.25  10:44 AM   Abbreviations: M myopia (nearsighted); A astigmatism; H hyperopia (farsighted); P presbyopia; Mrx spectacle prescription;  CTL contact lenses; OD right eye; OS left eye; OU both eyes  XT exotropia; ET esotropia; PEK punctate epithelial keratitis; PEE punctate epithelial erosions; DES dry eye syndrome; MGD meibomian gland dysfunction; ATs artificial tears; PFAT's preservative free artificial tears; NSC nuclear sclerotic cataract; PSC posterior subcapsular cataract; ERM epi-retinal membrane; PVD posterior vitreous detachment; RD retinal detachment; DM diabetes mellitus; DR diabetic retinopathy; NPDR non-proliferative diabetic retinopathy; PDR proliferative diabetic retinopathy; CSME clinically significant macular edema; DME diabetic macular edema; dbh dot blot hemorrhages; CWS cotton wool spot; POAG primary open angle glaucoma; C/D cup-to-disc ratio; HVF humphrey visual field; GVF goldmann visual field; OCT optical coherence tomography; IOP intraocular pressure; BRVO Branch retinal vein occlusion; CRVO central retinal vein occlusion; CRAO central retinal artery occlusion; BRAO branch retinal artery occlusion; RT retinal tear; SB scleral buckle; PPV pars  plana vitrectomy; VH Vitreous hemorrhage; PRP panretinal laser photocoagulation; IVK intravitreal kenalog ; VMT vitreomacular traction; MH Macular hole;  NVD neovascularization of the disc; NVE neovascularization elsewhere; AREDS age related eye disease study; ARMD age related macular degeneration; POAG primary open angle glaucoma; EBMD epithelial/anterior basement membrane dystrophy; ACIOL anterior chamber intraocular lens; IOL intraocular lens; PCIOL posterior chamber intraocular lens; Phaco/IOL phacoemulsification with intraocular lens placement; PRK photorefractive keratectomy; LASIK laser assisted in situ keratomileusis; HTN hypertension; DM diabetes mellitus; COPD chronic obstructive pulmonary disease

## 2024-09-04 NOTE — Progress Notes (Shared)
 [facility] - Clinic Note  09/08/2024   CHIEF COMPLAINT Patient presents for No chief complaint on file.  HISTORY OF PRESENT ILLNESS: Lori Schroeder is a 68 y.o. female who presents to the clinic today for:   Patient states the vision is getting worse, she feels the vision is blurrier.   Referring physician: Jolinda Norene HERO, DO [address] [place],  [place] [address]  HISTORICAL INFORMATION:  Selected notes from the MEDICAL RECORD NUMBER Referred by Dr. Jolinda for DM exam anddecreased vision OD LEE:  Ocular Hx- PMH-   CURRENT MEDICATIONS: No current outpatient medications on file. (Ophthalmic Drugs)   No current facility-administered medications for this visit. (Ophthalmic Drugs)   Current Outpatient Medications (Other)  Medication Sig   Albuterol -Budesonide (AIRSUPRA ) 90-80 MCG/ACT AERO Inhale 2 puffs into the lungs every 4 (four) hours as needed.   atorvastatin  (LIPITOR) 10 MG tablet Take 1 tablet (10 mg total) by mouth daily.   azelastine  (ASTELIN ) 0.1 % nasal spray Place 1 spray into both nostrils 2 (two) times daily as needed for rhinitis.   budesonide-glycopyrrolate-formoterol (BREZTRI  AEROSPHERE) 160-9-4.8 MCG/ACT AERO inhaler Inhale 2 puffs into the lungs 2 (two) times daily.   Dulaglutide  (TRULICITY ) 1.5 MG/0.5ML SOAJ Inject 1.5 mg into the skin once a week. INJECT 1.5MG  ONCE A WEEK   esomeprazole  (NEXIUM ) 40 MG capsule Take 1 capsule (40 mg total) by mouth 2 (two) times daily before a meal.   guaiFENesin -codeine  100-10 MG/5ML syrup Take 5 mLs by mouth every 6 (six) hours as needed for cough.   nicotine  (NICODERM CQ  - DOSED IN MG/24 HOURS) 14 mg/24hr patch Place 1 patch (14 mg total) onto the skin daily. Monht #2 (Patient not taking: Reported on 07/25/2024)   nicotine  (NICODERM CQ  - DOSED IN MG/24 HOURS) 21 mg/24hr patch Place 1 patch (21 mg total) onto the skin daily. Month #1 (Patient not taking: Reported on 07/25/2024)   nicotine  (NICODERM CQ  -  DOSED IN MG/24 HR) 7 mg/24hr patch Place 1 patch (7 mg total) onto the skin daily. Month #3 (Patient not taking: Reported on 07/25/2024)   promethazine -dextromethorphan (PROMETHAZINE -DM) 6.25-15 MG/5ML syrup Take 2.5 mLs by mouth 4 (four) times daily as needed for cough.   No current facility-administered medications for this visit. (Other)   REVIEW OF SYSTEMS:    ALLERGIES Allergies  Allergen Reactions   Hydroxychloroquine  Itching and Rash    Diffuse drug rash   Metformin  And Related Nausea Only    GI intolerance    Sulfa  Antibiotics    PAST MEDICAL HISTORY Past Medical History:  Diagnosis Date   Arthritis    Blood transfusion without reported diagnosis    COPD (chronic obstructive pulmonary disease) (HCC)    Diabetes mellitus without complication (HCC)    External hemorrhoids without mention of complication 04/22/2002   Colonoscopy   Family history of malignant neoplasm of gastrointestinal tract    Family history of malignant neoplasm of gastrointestinal tract    GERD (gastroesophageal reflux disease)    Hyperlipemia    Hypertension    Past Surgical History:  Procedure Laterality Date   ABDOMINAL HYSTERECTOMY     BACK SURGERY     BLADDER SURGERY     Bladder Tact   CARPAL TUNNEL RELEASE Bilateral    COLONOSCOPY     COLONOSCOPY [name] 03/13/2024   Procedure: COLONOSCOPY;  Surgeon: Shaaron Lamar HERO, MD;  Location: [facility];  Service: Endoscopy;  Laterality: [name];  1245pm, asa 2  FINGER SURGERY Right    Small finger fracture surgery   NECK SURGERY     FAMILY HISTORY Family History  Problem Relation Age of Onset   Alzheimer's disease Mother    Hyperlipidemia Mother    Hypertension Mother    Cancer Father    Alcohol abuse Father    Diabetes Daughter        Type 1   Diabetes Son    Colon cancer Maternal Grandmother    Arthritis Maternal Grandmother    Breast cancer Neg Hx    Celiac disease Neg Hx    Cirrhosis Neg Hx    Clotting disorder Neg Hx    Colitis  Neg Hx    Colon polyps Neg Hx    Crohn's disease Neg Hx    Cystic fibrosis Neg Hx    Esophageal cancer Neg Hx    Heart disease Neg Hx    Hemochromatosis Neg Hx    Inflammatory bowel disease Neg Hx    Irritable bowel syndrome Neg Hx    Kidney disease Neg Hx    Liver cancer Neg Hx    Liver disease Neg Hx    Ovarian cancer Neg Hx    Pancreatic cancer Neg Hx    Prostate cancer Neg Hx    Rectal cancer Neg Hx    Stomach cancer Neg Hx    Ulcerative colitis Neg Hx    Uterine cancer Neg Hx    Wilson's disease Neg Hx    SOCIAL HISTORY Social History   Tobacco Use   Smoking status: Every Day    Current packs/day: 0.75    Average packs/day: 0.8 packs/day for 28.0 years (21.0 ttl pk-yrs)    Types: Cigarettes   Smokeless tobacco: Never  Vaping Use   Vaping status: Never Used  Substance Use Topics   Alcohol use: No   Drug use: No       OPHTHALMIC EXAM:  Not recorded    IMAGING AND PROCEDURES  Imaging and Procedures for 09/08/2024          ASSESSMENT/PLAN:   ICD-10-CM   1. Vitreomacular adhesion of right eye  H43.821     2. Diabetes mellitus without complication (HCC)  E11.9     3. Diabetes mellitus treated with injections of non-insulin medication (HCC)  E11.9    Z79.85     4. Hypertensive retinopathy of both eyes  H35.033     5. Essential hypertension  I10     6. Combined forms of age-related cataract of both eyes  H25.813     7. Dry eyes, bilateral  H04.123       Vitreomacular traction w/ impending macular hole, OD - OCT shows OD: Central VMT w/ mild interval improvement in cystic changes--  ?VMT releasing - BCVA OD 20/80 - stable - discussed findings, prognosis and possible treatment options - recommend monitoring -- will f/u a little sooner - f/u in 6 weeks, sooner prn - DFE, OCT  2,3 Diabetes mellitus, type 2 without retinopathy  -A1C 6.2 (10.03.25), 6.3 (02.18.25) - The incidence, risk factors for progression, natural history and treatment options  for diabetic retinopathy were discussed with patient.   - The need for close monitoring of blood glucose, blood pressure, and serum lipids, avoiding cigarette or any type of tobacco, and the need for long term follow up was also discussed with patient. - f/u in 1 year, sooner prn    4,5. Hypertensive retinopathy OU - discussed importance of tight BP control -  monitor   6. Mixed Cataract OU - The symptoms of cataract, surgical options, and treatments and risks were discussed with patient. - discussed diagnosis and progression - monitor   7. Dry eyes OU - recommend artificial tears and lubricating ointment as needed   Ophthalmic Meds Ordered this visit:  No orders of the defined types were placed in this encounter.    No follow-ups on file.  There are no Patient Instructions on file for this visit.  Explained the diagnoses, plan, and follow up with the patient and they expressed understanding.  Patient expressed understanding of the importance of proper follow up care.   This document serves as a record of services personally performed by Redell JUDITHANN Hans, MD, PhD. It was created on their behalf by Avelina Pereyra, COA an ophthalmic technician. The creation of this record is the provider's dictation and/or activities during the visit.   Electronically signed by: Avelina GORMAN Pereyra, COT  09/08/24  7:34 AM   This document serves as a record of services personally performed by Redell JUDITHANN Hans, MD, PhD. It was created on their behalf by Wanda GEANNIE Keens, COT an ophthalmic technician. The creation of this record is the provider's dictation and/or activities during the visit.    Electronically signed by:  Wanda GEANNIE Keens, COT  09/08/24 7:35 AM   Redell JUDITHANN Hans, M.D., Ph.D. Diseases & Surgery of the Retina and Vitreous [facility] 09/08/2024  Abbreviations: M myopia (nearsighted); A astigmatism; H hyperopia (farsighted); P presbyopia; Mrx spectacle  prescription;  CTL contact lenses; OD right eye; OS left eye; OU both eyes  XT exotropia; ET esotropia; PEK punctate epithelial keratitis; PEE punctate epithelial erosions; DES dry eye syndrome; MGD meibomian gland dysfunction; ATs artificial tears; PFAT's preservative free artificial tears; NSC nuclear sclerotic cataract; PSC posterior subcapsular cataract; ERM epi-retinal membrane; PVD posterior vitreous detachment; RD retinal detachment; DM diabetes mellitus; DR diabetic retinopathy; NPDR non-proliferative diabetic retinopathy; PDR proliferative diabetic retinopathy; CSME clinically significant macular edema; DME diabetic macular edema; dbh dot blot hemorrhages; CWS cotton wool spot; POAG primary open angle glaucoma; C/D cup-to-disc ratio; HVF humphrey visual field; GVF goldmann visual field; OCT optical coherence tomography; IOP intraocular pressure; BRVO Branch retinal vein occlusion; CRVO central retinal vein occlusion; CRAO central retinal artery occlusion; BRAO branch retinal artery occlusion; RT retinal tear; SB scleral buckle; PPV pars plana vitrectomy; VH Vitreous hemorrhage; PRP panretinal laser photocoagulation; IVK intravitreal kenalog ; VMT vitreomacular traction; MH Macular hole;  NVD neovascularization of the disc; NVE neovascularization elsewhere; AREDS age related eye disease study; ARMD age related macular degeneration; POAG primary open angle glaucoma; EBMD epithelial/anterior basement membrane dystrophy; ACIOL anterior chamber intraocular lens; IOL intraocular lens; PCIOL posterior chamber intraocular lens; Phaco/IOL phacoemulsification with intraocular lens placement; PRK photorefractive keratectomy; LASIK laser assisted in situ keratomileusis; HTN hypertension; DM diabetes mellitus; COPD chronic obstructive pulmonary disease

## 2024-09-05 ENCOUNTER — Encounter (INDEPENDENT_AMBULATORY_CARE_PROVIDER_SITE_OTHER): Admitting: Ophthalmology

## 2024-09-08 ENCOUNTER — Encounter (INDEPENDENT_AMBULATORY_CARE_PROVIDER_SITE_OTHER): Admitting: Ophthalmology

## 2024-09-08 DIAGNOSIS — H43821 Vitreomacular adhesion, right eye: Secondary | ICD-10-CM

## 2024-09-08 DIAGNOSIS — H25813 Combined forms of age-related cataract, bilateral: Secondary | ICD-10-CM

## 2024-09-08 DIAGNOSIS — H35033 Hypertensive retinopathy, bilateral: Secondary | ICD-10-CM

## 2024-09-08 DIAGNOSIS — I10 Essential (primary) hypertension: Secondary | ICD-10-CM

## 2024-09-08 DIAGNOSIS — E119 Type 2 diabetes mellitus without complications: Secondary | ICD-10-CM

## 2024-09-08 DIAGNOSIS — H04123 Dry eye syndrome of bilateral lacrimal glands: Secondary | ICD-10-CM

## 2024-09-11 NOTE — Progress Notes (Shared)
 [facility] - Clinic Note  09/16/2024   CHIEF COMPLAINT Patient presents for No chief complaint on file.  HISTORY OF PRESENT ILLNESS: Lori Schroeder is a 68 y.o. female who presents to the clinic today for:   Patient states the vision is getting worse, she feels the vision is blurrier.   Referring physician: Jolinda Norene HERO, DO [address] [place],  [place] [address]  HISTORICAL INFORMATION:  Selected notes from the MEDICAL RECORD NUMBER Referred by Dr. Jolinda for DM exam anddecreased vision OD LEE:  Ocular Hx- PMH-   CURRENT MEDICATIONS: No current outpatient medications on file. (Ophthalmic Drugs)   No current facility-administered medications for this visit. (Ophthalmic Drugs)   Current Outpatient Medications (Other)  Medication Sig   Albuterol -Budesonide (AIRSUPRA ) 90-80 MCG/ACT AERO Inhale 2 puffs into the lungs every 4 (four) hours as needed.   atorvastatin  (LIPITOR) 10 MG tablet Take 1 tablet (10 mg total) by mouth daily.   azelastine  (ASTELIN ) 0.1 % nasal spray Place 1 spray into both nostrils 2 (two) times daily as needed for rhinitis.   budesonide-glycopyrrolate-formoterol (BREZTRI  AEROSPHERE) 160-9-4.8 MCG/ACT AERO inhaler Inhale 2 puffs into the lungs 2 (two) times daily.   Dulaglutide  (TRULICITY ) 1.5 MG/0.5ML SOAJ Inject 1.5 mg into the skin once a week. INJECT 1.5MG  ONCE A WEEK   esomeprazole  (NEXIUM ) 40 MG capsule Take 1 capsule (40 mg total) by mouth 2 (two) times daily before a meal.   guaiFENesin -codeine  100-10 MG/5ML syrup Take 5 mLs by mouth every 6 (six) hours as needed for cough.   nicotine  (NICODERM CQ  - DOSED IN MG/24 HOURS) 14 mg/24hr patch Place 1 patch (14 mg total) onto the skin daily. Monht #2 (Patient not taking: Reported on 07/25/2024)   nicotine  (NICODERM CQ  - DOSED IN MG/24 HOURS) 21 mg/24hr patch Place 1 patch (21 mg total) onto the skin daily. Month #1 (Patient not taking: Reported on 07/25/2024)   nicotine  (NICODERM CQ  -  DOSED IN MG/24 HR) 7 mg/24hr patch Place 1 patch (7 mg total) onto the skin daily. Month #3 (Patient not taking: Reported on 07/25/2024)   promethazine -dextromethorphan (PROMETHAZINE -DM) 6.25-15 MG/5ML syrup Take 2.5 mLs by mouth 4 (four) times daily as needed for cough.   No current facility-administered medications for this visit. (Other)   REVIEW OF SYSTEMS:    ALLERGIES Allergies  Allergen Reactions   Hydroxychloroquine  Itching and Rash    Diffuse drug rash   Metformin  And Related Nausea Only    GI intolerance    Sulfa  Antibiotics    PAST MEDICAL HISTORY Past Medical History:  Diagnosis Date   Arthritis    Blood transfusion without reported diagnosis    COPD (chronic obstructive pulmonary disease) (HCC)    Diabetes mellitus without complication (HCC)    External hemorrhoids without mention of complication 04/22/2002   Colonoscopy   Family history of malignant neoplasm of gastrointestinal tract    Family history of malignant neoplasm of gastrointestinal tract    GERD (gastroesophageal reflux disease)    Hyperlipemia    Hypertension    Past Surgical History:  Procedure Laterality Date   ABDOMINAL HYSTERECTOMY     BACK SURGERY     BLADDER SURGERY     Bladder Tact   CARPAL TUNNEL RELEASE Bilateral    COLONOSCOPY     COLONOSCOPY [name] 03/13/2024   Procedure: COLONOSCOPY;  Surgeon: Shaaron Lamar HERO, MD;  [facility]: [facility];  Service: Endoscopy;  Laterality: [name];  1245pm, asa 2  FINGER SURGERY Right    Small finger fracture surgery   NECK SURGERY     FAMILY HISTORY Family History  Problem Relation Age of Onset   Alzheimer's disease Mother    Hyperlipidemia Mother    Hypertension Mother    Cancer Father    Alcohol abuse Father    Diabetes Daughter        Type 1   Diabetes Son    Colon cancer Maternal Grandmother    Arthritis Maternal Grandmother    Breast cancer Neg Hx    Celiac disease Neg Hx    Cirrhosis Neg Hx    Clotting disorder Neg Hx    Colitis  Neg Hx    Colon polyps Neg Hx    Crohn's disease Neg Hx    Cystic fibrosis Neg Hx    Esophageal cancer Neg Hx    Heart disease Neg Hx    Hemochromatosis Neg Hx    Inflammatory bowel disease Neg Hx    Irritable bowel syndrome Neg Hx    Kidney disease Neg Hx    Liver cancer Neg Hx    Liver disease Neg Hx    Ovarian cancer Neg Hx    Pancreatic cancer Neg Hx    Prostate cancer Neg Hx    Rectal cancer Neg Hx    Stomach cancer Neg Hx    Ulcerative colitis Neg Hx    Uterine cancer Neg Hx    Wilson's disease Neg Hx    SOCIAL HISTORY Social History   Tobacco Use   Smoking status: Every Day    Current packs/day: 0.75    Average packs/day: 0.8 packs/day for 28.0 years (21.0 ttl pk-yrs)    Types: Cigarettes   Smokeless tobacco: Never  Vaping Use   Vaping status: Never Used  Substance Use Topics   Alcohol use: No   Drug use: No       OPHTHALMIC EXAM:  Not recorded    IMAGING AND PROCEDURES  Imaging and Procedures for 09/16/2024          ASSESSMENT/PLAN: No diagnosis found.   Vitreomacular traction w/ impending macular hole, OD - OCT shows OD: Central VMT w/ mild interval improvement in cystic changes--  ?VMT releasing - BCVA OD 20/80 - stable - discussed findings, prognosis and possible treatment options - recommend monitoring -- will f/u a little sooner - f/u in 6 weeks, sooner prn - DFE, OCT  2,3 Diabetes mellitus, type 2 without retinopathy  -A1C 6.2 (10.03.25), 6.3 (02.18.25) - The incidence, risk factors for progression, natural history and treatment options for diabetic retinopathy were discussed with patient.   - The need for close monitoring of blood glucose, blood pressure, and serum lipids, avoiding cigarette or any type of tobacco, and the need for long term follow up was also discussed with patient. - f/u in 1 year, sooner prn    4,5. Hypertensive retinopathy OU - discussed importance of tight BP control - monitor   6. Mixed Cataract OU - The  symptoms of cataract, surgical options, and treatments and risks were discussed with patient. - discussed diagnosis and progression - monitor   7. Dry eyes OU - recommend artificial tears and lubricating ointment as needed   Ophthalmic Meds Ordered this visit:  No orders of the defined types were placed in this encounter.    No follow-ups on file.  There are no Patient Instructions on file for this visit.  Explained the diagnoses, plan, and follow up with the patient  and they expressed understanding.  Patient expressed understanding of the importance of proper follow up care.    This document serves as a record of services personally performed by Redell JUDITHANN Hans, MD, PhD. It was created on their behalf by Wanda GEANNIE Keens, COT an ophthalmic technician. The creation of this record is the provider's dictation and/or activities during the visit.    Electronically signed by:  Wanda GEANNIE Keens, COT  09/11/2024 7:28 AM   Redell JUDITHANN Hans, M.D., Ph.D. Diseases & Surgery of the Retina and Vitreous [facility] 09/16/2024  Abbreviations: M myopia (nearsighted); A astigmatism; H hyperopia (farsighted); P presbyopia; Mrx spectacle prescription;  CTL contact lenses; OD right eye; OS left eye; OU both eyes  XT exotropia; ET esotropia; PEK punctate epithelial keratitis; PEE punctate epithelial erosions; DES dry eye syndrome; MGD meibomian gland dysfunction; ATs artificial tears; PFAT's preservative free artificial tears; NSC nuclear sclerotic cataract; PSC posterior subcapsular cataract; ERM epi-retinal membrane; PVD posterior vitreous detachment; RD retinal detachment; DM diabetes mellitus; DR diabetic retinopathy; NPDR non-proliferative diabetic retinopathy; PDR proliferative diabetic retinopathy; CSME clinically significant macular edema; DME diabetic macular edema; dbh dot blot hemorrhages; CWS cotton wool spot; POAG primary open angle glaucoma; C/D cup-to-disc ratio;  HVF humphrey visual field; GVF goldmann visual field; OCT optical coherence tomography; IOP intraocular pressure; BRVO Branch retinal vein occlusion; CRVO central retinal vein occlusion; CRAO central retinal artery occlusion; BRAO branch retinal artery occlusion; RT retinal tear; SB scleral buckle; PPV pars plana vitrectomy; VH Vitreous hemorrhage; PRP panretinal laser photocoagulation; IVK intravitreal kenalog ; VMT vitreomacular traction; MH Macular hole;  NVD neovascularization of the disc; NVE neovascularization elsewhere; AREDS age related eye disease study; ARMD age related macular degeneration; POAG primary open angle glaucoma; EBMD epithelial/anterior basement membrane dystrophy; ACIOL anterior chamber intraocular lens; IOL intraocular lens; PCIOL posterior chamber intraocular lens; Phaco/IOL phacoemulsification with intraocular lens placement; PRK photorefractive keratectomy; LASIK laser assisted in situ keratomileusis; HTN hypertension; DM diabetes mellitus; COPD chronic obstructive pulmonary disease

## 2024-09-16 ENCOUNTER — Encounter (INDEPENDENT_AMBULATORY_CARE_PROVIDER_SITE_OTHER): Admitting: Ophthalmology

## 2024-09-16 NOTE — Telephone Encounter (Signed)
 See separated PharmD encounter

## 2024-10-06 NOTE — Progress Notes (Signed)
 " [facility] - Clinic Note  10/09/2024   CHIEF COMPLAINT Patient presents for Retina Follow Up  HISTORY OF PRESENT ILLNESS: Lori Schroeder is a 69 y.o. female who presents to the clinic today for:  HPI     Retina Follow Up   Patient presents with  Other.  In right eye.  This started 11 weeks ago.  I, the attending physician,  performed the HPI with the patient and updated documentation appropriately.        Comments   Patient here for 11 weeks retina follow up for VMT OD. Patient states she fell 12.5.25 and hit right side of face. Pt c/o VA being worse since fall and having headaches. Pt uses AT's PRN OU.       Last edited by Valdemar Rogue, MD on 10/19/2024  3:17 PM.     Patient states she feels VA is blurry especially when driving or reading. Signs are all blurry. Slight distortion in OD when she looks at her alarm clock. Had a fall and bruised OD, her brother also passed which is why she's delayed.   Referring physician: Jolinda Norene HERO, DO [address] [place],  [place] [address]  HISTORICAL INFORMATION:  Selected notes from the MEDICAL RECORD NUMBER Referred by Dr. Jolinda for DM exam anddecreased vision OD LEE:  Ocular Hx- PMH-   CURRENT MEDICATIONS: No current outpatient medications on file. (Ophthalmic Drugs)   No current facility-administered medications for this visit. (Ophthalmic Drugs)   Current Outpatient Medications (Other)  Medication Sig   Albuterol -Budesonide (AIRSUPRA ) 90-80 MCG/ACT AERO Inhale 2 puffs into the lungs every 4 (four) hours as needed.   atorvastatin  (LIPITOR) 10 MG tablet Take 1 tablet (10 mg total) by mouth daily.   azelastine  (ASTELIN ) 0.1 % nasal spray Place 1 spray into both nostrils 2 (two) times daily as needed for rhinitis.   budesonide-glycopyrrolate-formoterol (BREZTRI  AEROSPHERE) 160-9-4.8 MCG/ACT AERO inhaler Inhale 2 puffs into the lungs 2 (two) times daily.   Dulaglutide  (TRULICITY ) 1.5 MG/0.5ML SOAJ Inject  1.5 mg into the skin once a week. INJECT 1.5MG  ONCE A WEEK   esomeprazole  (NEXIUM ) 40 MG capsule Take 1 capsule (40 mg total) by mouth 2 (two) times daily before a meal.   guaiFENesin -codeine  100-10 MG/5ML syrup Take 5 mLs by mouth every 6 (six) hours as needed for cough. (Patient not taking: Reported on 10/13/2024)   nicotine  (NICODERM CQ  - DOSED IN MG/24 HOURS) 14 mg/24hr patch Place 1 patch (14 mg total) onto the skin daily. Monht #2 (Patient not taking: Reported on 10/13/2024)   nicotine  (NICODERM CQ  - DOSED IN MG/24 HOURS) 21 mg/24hr patch Place 1 patch (21 mg total) onto the skin daily. Month #1 (Patient not taking: Reported on 10/13/2024)   nicotine  (NICODERM CQ  - DOSED IN MG/24 HR) 7 mg/24hr patch Place 1 patch (7 mg total) onto the skin daily. Month #3 (Patient not taking: Reported on 10/13/2024)   promethazine -dextromethorphan (PROMETHAZINE -DM) 6.25-15 MG/5ML syrup Take 2.5 mLs by mouth 4 (four) times daily as needed for cough. (Patient not taking: Reported on 10/13/2024)   No current facility-administered medications for this visit. (Other)   REVIEW OF SYSTEMS:    ALLERGIES Allergies  Allergen Reactions   Hydroxychloroquine  Itching and Rash    Diffuse drug rash   Metformin  And Related Nausea Only    GI intolerance    Sulfa  Antibiotics    PAST MEDICAL HISTORY Past Medical History:  Diagnosis Date   Arthritis  Blood transfusion without reported diagnosis    Cataract    COPD (chronic obstructive pulmonary disease) (HCC)    Diabetes mellitus without complication (HCC)    External hemorrhoids without mention of complication 04/22/2002   Colonoscopy   Family history of malignant neoplasm of gastrointestinal tract    Family history of malignant neoplasm of gastrointestinal tract    GERD (gastroesophageal reflux disease)    Hyperlipemia    Hypertension    Past Surgical History:  Procedure Laterality Date   ABDOMINAL HYSTERECTOMY     BACK SURGERY     BLADDER SURGERY      Bladder Tact   CARPAL TUNNEL RELEASE Bilateral    COLONOSCOPY     COLONOSCOPY [name] 03/13/2024   Procedure: COLONOSCOPY;  Surgeon: Shaaron Lamar HERO, MD;  Location: [facility];  Service: Endoscopy;  Laterality: [name];  1245pm, asa 2   FINGER SURGERY Right    Small finger fracture surgery   NECK SURGERY     FAMILY HISTORY Family History  Problem Relation Age of Onset   Alzheimer's disease Mother    Hyperlipidemia Mother    Hypertension Mother    Cancer Father    Alcohol abuse Father    Diabetes Daughter        Type 1   Diabetes Son    Colon cancer Maternal Grandmother    Arthritis Maternal Grandmother    Breast cancer Neg Hx    Celiac disease Neg Hx    Cirrhosis Neg Hx    Clotting disorder Neg Hx    Colitis Neg Hx    Colon polyps Neg Hx    Crohn's disease Neg Hx    Cystic fibrosis Neg Hx    Esophageal cancer Neg Hx    Heart disease Neg Hx    Hemochromatosis Neg Hx    Inflammatory bowel disease Neg Hx    Irritable bowel syndrome Neg Hx    Kidney disease Neg Hx    Liver cancer Neg Hx    Liver disease Neg Hx    Ovarian cancer Neg Hx    Pancreatic cancer Neg Hx    Prostate cancer Neg Hx    Rectal cancer Neg Hx    Stomach cancer Neg Hx    Ulcerative colitis Neg Hx    Uterine cancer Neg Hx    Wilson's disease Neg Hx    SOCIAL HISTORY Social History   Tobacco Use   Smoking status: Every Day    Current packs/day: 0.75    Average packs/day: 0.8 packs/day for 28.0 years (21.0 ttl pk-yrs)    Types: Cigarettes   Smokeless tobacco: Never  Vaping Use   Vaping status: Never Used  Substance Use Topics   Alcohol use: No   Drug use: No       OPHTHALMIC EXAM:  Base Eye Exam     Visual Acuity (Snellen - Linear)       Right Left   Dist cc 20/60 -3 20/30 -3   Dist ph cc 20/50 -2 20/20    Correction: Glasses         Tonometry (Tonopen, 10:24 AM)       Right Left   Pressure 16 17         Pupils       Dark Light Shape React APD   Right 3 2 Round Brisk None    Left 3 2 Round Brisk None         Visual Fields  Left Right    Full Full         Extraocular Movement       Right Left    Full, Ortho Full, Ortho         Neuro/Psych     Oriented x3: Yes   Mood/Affect: Normal         Dilation     Both eyes: 1.0% Mydriacyl, 2.5% Phenylephrine @ 10:24 AM           Slit Lamp and Fundus Exam     External Exam       Right Left   External Normal Normal         Slit Lamp Exam       Right Left   Lids/Lashes Dermatochalasis - upper lid Dermatochalasis - upper lid   Conjunctiva/Sclera White and quiet White and quiet   Cornea 1+ Punctate epithelial erosions, Debris in tear film 1+ Punctate epithelial erosions, Debris in tear film   Anterior Chamber Deep and quiet, Deep, narrow temporal angle Deep and quiet, Deep, narrow temporal angle   Iris Round and dilated, No NVI Round and dilated, No NVI   Lens 2-3+ Nuclear sclerosis brunescence, 2-3+ Cortical cataract 2-3+ Nuclear sclerosis brunescence, 2-3+ Cortical cataract, focal pigment deposition on central anterior capsule   Anterior Vitreous Vitreous syneresis Vitreous syneresis         Fundus Exam       Right Left   Disc Pink and sharp Pink and sharp, Compact, temp PPP   C/D Ratio 0.2 0.2   Macula Flat, Blunted foveal reflex, RPE mottling, central cyst- slightly improved, no heme Flat, central cyst, Good foveal reflex, No heme or edema   Vessels Vascular attenuation, Tortuous Vascular attenuation, Tortuous   Periphery Attached, Reticular degeneration, No heme Attached, Reticular degeneration, No heme           Refraction     Wearing Rx       Sphere Cylinder Axis Add   Right +0.25 +2.00 176 +2.25   Left +0.75 +1.00 005 +2.25           IMAGING AND PROCEDURES  Imaging and Procedures for 10/09/2024  OCT, Retina - OU - Both Eyes       Right Eye Quality was good. Central Foveal Thickness: 297. Findings include no SRF, vitreous traction, vitreomacular  adhesion (Central VMT w/ mild interval improvement in cystic changes--less traction; focal ellipsoid disruption centrally--?impending mac hole).   Left Eye Quality was good. Central Foveal Thickness: 269. Progression has been stable. Findings include normal foveal contour, no IRF, no SRF, vitreomacular adhesion (No DME).   Notes *Images captured and stored on drive  Diagnosis / Impression:  OD: Central VMT w/ mild interval improvement in cystic changes--less traction; focal ellipsoid disruption centrally--?impending mac hole OS: no DME  Clinical management:  See below  Abbreviations: NFP - Normal foveal profile. CME - cystoid macular edema. PED - pigment epithelial detachment. IRF - intraretinal fluid. SRF - subretinal fluid. EZ - ellipsoid zone. ERM - epiretinal membrane. ORA - outer retinal atrophy. ORT - outer retinal tubulation. SRHM - subretinal hyper-reflective material. IRHM - intraretinal hyper-reflective material             ASSESSMENT/PLAN:   ICD-10-CM   1. 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 Diabetes mellitus without complication (HCC)  E11.9     3. Diabetes mellitus treated with injections of non-insulin medication (HCC)  E11.9    Z79.85     4. Hypertensive retinopathy of both eyes  H35.033     5. Essential hypertension  I10     6. Combined forms of age-related cataract of both eyes  H25.813     7. Dry eyes, bilateral  H04.123      Vitreomacular traction w/ impending macular hole, OD - OCT shows OD: Central VMT w/ mild interval improvement in cystic changes--less traction; focal ellipsoid disruption centrally--?impending mac hole at 10 weeks - BCVA OD 20/80 - stable - discussed findings, prognosis and possible treatment options - recommend monitoring -- will f/u a little sooner - f/u in 4-6 weeks, sooner prn - DFE, OCT  2,3 Diabetes mellitus, type 2 without retinopathy  -A1C 6.2 (10.03.25), 6.3 (02.18.25) - The  incidence, risk factors for progression, natural history and treatment options for diabetic retinopathy were discussed with patient.   - The need for close monitoring of blood glucose, blood pressure, and serum lipids, avoiding cigarette or any type of tobacco, and the need for long term follow up was also discussed with patient. - f/u in 1 year, sooner prn    4,5. Hypertensive retinopathy OU - discussed importance of tight BP control - monitor   6. Mixed Cataract OU  - 2-3 NS w/ brunescence, 2-3 cortical OU - The symptoms of cataract, surgical options, and treatments and risks were discussed with patient. - discussed diagnosis and progression - Will refer patient to [facility] in [place] for cataract eval   7. Dry eyes OU - recommend artificial tears and lubricating ointment as needed   Ophthalmic Meds Ordered this visit:  No orders of the defined types were placed in this encounter.    Return for 4-6 weeks VMT OD, DFE, OCT.  There are no Patient Instructions on file for this visit.  This document serves as a record of services personally performed by Redell JUDITHANN Hans, MD, PhD. It was created on their behalf by Avelina Pereyra, COA an ophthalmic technician. The creation of this record is the provider's dictation and/or activities during the visit.   Electronically signed by: Avelina GORMAN Pereyra, COT  10/19/24  3:18 PM   This document serves as a record of services personally performed by Redell JUDITHANN Hans, MD, PhD. It was created on their behalf by Almetta Pesa, an ophthalmic technician. The creation of this record is the provider's dictation and/or activities during the visit.    Electronically signed by: Almetta Pesa, OA, 10/19/24  3:18 PM  Redell JUDITHANN Hans, M.D., Ph.D. Diseases & Surgery of the Retina and Vitreous [facility] 10/09/2024  I have reviewed the above documentation for accuracy and completeness, and I agree with the above. Redell JUDITHANN Hans, M.D., Ph.D. 10/19/24 3:19 PM   Abbreviations: M myopia (nearsighted); A astigmatism; H hyperopia (farsighted); P presbyopia; Mrx spectacle prescription;  CTL contact lenses; OD right eye; OS left eye; OU both eyes  XT exotropia; ET esotropia; PEK punctate epithelial keratitis; PEE punctate epithelial erosions; DES dry eye syndrome; MGD meibomian gland dysfunction; ATs artificial tears; PFAT's preservative free artificial tears; NSC nuclear sclerotic cataract; PSC posterior subcapsular cataract; ERM epi-retinal membrane; PVD posterior vitreous detachment; RD retinal detachment; DM diabetes mellitus; DR diabetic retinopathy; NPDR non-proliferative diabetic retinopathy; PDR proliferative diabetic retinopathy; CSME clinically significant macular edema; DME diabetic macular edema; dbh dot blot hemorrhages; CWS cotton wool spot; POAG primary open angle glaucoma; C/D cup-to-disc ratio; HVF humphrey visual field; GVF goldmann visual  field; OCT optical coherence tomography; IOP intraocular pressure; BRVO Branch retinal vein occlusion; CRVO central retinal vein occlusion; CRAO central retinal artery occlusion; BRAO branch retinal artery occlusion; RT retinal tear; SB scleral buckle; PPV pars plana vitrectomy; VH Vitreous hemorrhage; PRP panretinal laser photocoagulation; IVK intravitreal kenalog ; VMT vitreomacular traction; MH Macular hole;  NVD neovascularization of the disc; NVE neovascularization elsewhere; AREDS age related eye disease study; ARMD age related macular degeneration; POAG primary open angle glaucoma; EBMD epithelial/anterior basement membrane dystrophy; ACIOL anterior chamber intraocular lens; IOL intraocular lens; PCIOL posterior chamber intraocular lens; Phaco/IOL phacoemulsification with intraocular lens placement; PRK photorefractive keratectomy; LASIK laser assisted in situ keratomileusis; HTN hypertension; DM diabetes mellitus; COPD chronic obstructive pulmonary disease  "

## 2024-10-07 ENCOUNTER — Telehealth: Payer: Self-pay | Admitting: Family Medicine

## 2024-10-07 NOTE — Telephone Encounter (Signed)
 Noted patient was added to wait list sent to you just as FYI
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
## 2024-10-07 NOTE — Telephone Encounter (Signed)
 Ok to double book her into one of the virtual visits tomorrow.

## 2024-10-08 NOTE — Telephone Encounter (Signed)
 Appointment scheduled for Monday the 12th at 10:00 am with Dr. Jolinda

## 2024-10-09 ENCOUNTER — Encounter (INDEPENDENT_AMBULATORY_CARE_PROVIDER_SITE_OTHER): Payer: Self-pay | Admitting: Ophthalmology

## 2024-10-09 ENCOUNTER — Ambulatory Visit (INDEPENDENT_AMBULATORY_CARE_PROVIDER_SITE_OTHER): Admitting: Ophthalmology

## 2024-10-09 DIAGNOSIS — I10 Essential (primary) hypertension: Secondary | ICD-10-CM | POA: Diagnosis not present

## 2024-10-09 DIAGNOSIS — E119 Type 2 diabetes mellitus without complications: Secondary | ICD-10-CM | POA: Diagnosis not present

## 2024-10-09 DIAGNOSIS — H35033 Hypertensive retinopathy, bilateral: Secondary | ICD-10-CM | POA: Diagnosis not present

## 2024-10-09 DIAGNOSIS — Z7985 Long-term (current) use of injectable non-insulin antidiabetic drugs: Secondary | ICD-10-CM | POA: Diagnosis not present

## 2024-10-09 DIAGNOSIS — H25813 Combined forms of age-related cataract, bilateral: Secondary | ICD-10-CM | POA: Diagnosis not present

## 2024-10-09 DIAGNOSIS — H43821 Vitreomacular adhesion, right eye: Secondary | ICD-10-CM | POA: Diagnosis not present

## 2024-10-09 DIAGNOSIS — H04123 Dry eye syndrome of bilateral lacrimal glands: Secondary | ICD-10-CM | POA: Diagnosis not present

## 2024-10-13 ENCOUNTER — Encounter: Payer: Self-pay | Admitting: Family Medicine

## 2024-10-13 ENCOUNTER — Ambulatory Visit: Admitting: Family Medicine

## 2024-10-13 VITALS — BP 144/72 | HR 82 | Temp 97.2°F | Ht <= 58 in | Wt 121.4 lb

## 2024-10-13 DIAGNOSIS — Z7985 Long-term (current) use of injectable non-insulin antidiabetic drugs: Secondary | ICD-10-CM | POA: Diagnosis not present

## 2024-10-13 DIAGNOSIS — J3489 Other specified disorders of nose and nasal sinuses: Secondary | ICD-10-CM | POA: Diagnosis not present

## 2024-10-13 DIAGNOSIS — S060X0A Concussion without loss of consciousness, initial encounter: Secondary | ICD-10-CM

## 2024-10-13 DIAGNOSIS — E119 Type 2 diabetes mellitus without complications: Secondary | ICD-10-CM | POA: Diagnosis not present

## 2024-10-13 MED ORDER — AZELASTINE HCL 0.1 % NA SOLN: 1.0000 | Freq: Two times a day (BID) | NASAL | 1 refills | Status: AC | PRN

## 2024-10-13 NOTE — Progress Notes (Addendum)
 "  Subjective: CC: Headache PCP: Jolinda Norene HERO, DO Lori Schroeder is a 69 y.o. female presenting to clinic today for:  Patient reports that she hit her head on one of her trash cans when she was bending over about a month ago.  She had diffuse bruising on the right side of her upper forehead.  She reports that she has had a daily, albeit slightly improving, headache ever since then.  She does not wake up with a headache and denies any associated symptoms including visual disturbance (outside of simply not seeing well at baseline), unilateral sensory or motor changes, no difficulty swallowing or speaking.  No balance issues.  She is been utilizing 2 Tylenol's and 1 ibuprofen per day.   ROS: Per HPI  Allergies[1] Past Medical History:  Diagnosis Date   Arthritis    Blood transfusion without reported diagnosis    COPD (chronic obstructive pulmonary disease) (HCC)    Diabetes mellitus without complication (HCC)    External hemorrhoids without mention of complication 04/22/2002   Colonoscopy   Family history of malignant neoplasm of gastrointestinal tract    Family history of malignant neoplasm of gastrointestinal tract    GERD (gastroesophageal reflux disease)    Hyperlipemia    Hypertension    Current Medications[2] Social History   Socioeconomic History   Marital status: Married    Spouse name: Evalene   Number of children: 2   Years of education: 14   Highest education level: Associate degree: occupational, scientist, product/process development, or vocational program  Occupational History   Occupation: disability    Employer: DISABLED  Tobacco Use   Smoking status: Every Day    Current packs/day: 0.75    Average packs/day: 0.8 packs/day for 28.0 years (21.0 ttl pk-yrs)    Types: Cigarettes   Smokeless tobacco: Never  Vaping Use   Vaping status: Never Used  Substance and Sexual Activity   Alcohol use: No   Drug use: No   Sexual activity: Not on file  Other Topics Concern   Not on file   Social History Narrative   Not on file   Social Drivers of Health   Tobacco Use: High Risk (10/09/2024)   Patient History    Smoking Tobacco Use: Every Day    Smokeless Tobacco Use: Never    Passive Exposure: Not on file  Financial Resource Strain: Low Risk (05/05/2024)   Overall Financial Resource Strain (CARDIA)    Difficulty of Paying Living Expenses: Not hard at all  Food Insecurity: No Food Insecurity (05/05/2024)   Epic    Worried About Radiation Protection Practitioner of Food in the Last Year: Never true    Ran Out of Food in the Last Year: Never true  Transportation Needs: No Transportation Needs (05/05/2024)   Epic    Lack of Transportation (Medical): No    Lack of Transportation (Non-Medical): No  Physical Activity: Inactive (05/05/2024)   Exercise Vital Sign    Days of Exercise per Week: 0 days    Minutes of Exercise per Session: 0 min  Stress: No Stress Concern Present (05/05/2024)   [employer] of Occupational Health - Occupational Stress Questionnaire    Feeling of Stress: Not at all  Social Connections: Moderately Isolated (05/05/2024)   Social Connection and Isolation Panel    Frequency of Communication with Friends and Family: Once a week    Frequency of Social Gatherings with Friends and Family: Once a week    Attends Religious Services: 1 to 4 times per year  Active Member of Clubs or Organizations: No    Attends Banker Meetings: Never    Marital Status: Married  Catering Manager Violence: Not At Risk (05/05/2024)   Epic    Fear of Current or Ex-Partner: No    Emotionally Abused: No    Physically Abused: No    Sexually Abused: No  Depression (PHQ2-9): Low Risk (07/14/2024)   Depression (PHQ2-9)    PHQ-2 Score: 0  Alcohol Screen: Low Risk (05/05/2024)   Alcohol Screen    Last Alcohol Screening Score (AUDIT): 0  Housing: Unknown (05/05/2024)   Epic    Unable to Pay for Housing in the Last Year: No    Number of Times Moved in the Last Year: Not on file    Homeless  in the Last Year: No  Utilities: Not At Risk (05/05/2024)   Epic    Threatened with loss of utilities: No  Health Literacy: Adequate Health Literacy (05/05/2024)   B1300 Health Literacy    Frequency of need for help with medical instructions: Never   Family History  Problem Relation Age of Onset   Alzheimer's disease Mother    Hyperlipidemia Mother    Hypertension Mother    Cancer Father    Alcohol abuse Father    Diabetes Daughter        Type 1   Diabetes Son    Colon cancer Maternal Grandmother    Arthritis Maternal Grandmother    Breast cancer Neg Hx    Celiac disease Neg Hx    Cirrhosis Neg Hx    Clotting disorder Neg Hx    Colitis Neg Hx    Colon polyps Neg Hx    Crohn's disease Neg Hx    Cystic fibrosis Neg Hx    Esophageal cancer Neg Hx    Heart disease Neg Hx    Hemochromatosis Neg Hx    Inflammatory bowel disease Neg Hx    Irritable bowel syndrome Neg Hx    Kidney disease Neg Hx    Liver cancer Neg Hx    Liver disease Neg Hx    Ovarian cancer Neg Hx    Pancreatic cancer Neg Hx    Prostate cancer Neg Hx    Rectal cancer Neg Hx    Stomach cancer Neg Hx    Ulcerative colitis Neg Hx    Uterine cancer Neg Hx    Wilson's disease Neg Hx     Objective: Office vital signs reviewed. BP (!) 144/72   Pulse 82   Temp (!) 97.2 F (36.2 C)   Ht 4' 9 (1.448 m)   Wt 121 lb 6 oz (55.1 kg)   SpO2 98%   BMI 26.27 kg/m   Physical Examination:  General: Awake, alert, well nourished, No acute distress HEENT: NCAT, PERRLA, EOMI.  Sclera white MSK: Ambulating independently with normal gait and station Neuro: Current nerves II through XII grossly intact.  Normal cerebellar testing.  5/5 upper and lower extremity strength with light touch sensation grossly intact.  No focal neurologic deficits.  Assessment/ Plan: 69 y.o. female   Concussion without loss of consciousness, initial encounter  Rhinorrhea - Plan: azelastine  (ASTELIN ) 0.1 % nasal spray  Diabetes mellitus  treated with injections of non-insulin medication (HCC) - Plan: AMB Referral [facility] Care Management   Suspect postconcussive headache.  It is improving but she has not done any of the postconcussive measures to allow for full brain rest and we discussed what those were today.  Encourage p.o.  hydration.  We discussed red flag signs and symptoms warranting further evaluation.  Neurologic exam was totally unremarkable today.  Will plan for referral to neurology if symptoms are persisting or she is demonstrating signs of chronic postconcussive symptoms.  Additionally, did not discuss rhinorrhea but needed Astelin  renewed  Referral back to [facility] for assistance with Trulicity .  Lilly care foundation information placed on [employer] today   Norene CHRISTELLA Fielding, DO [facility] [phone]     [1]  Allergies Allergen Reactions   Hydroxychloroquine  Itching and Rash    Diffuse drug rash   Metformin  And Related Nausea Only    GI intolerance    Sulfa  Antibiotics   [2]  Current Outpatient Medications:    Albuterol -Budesonide (AIRSUPRA ) 90-80 MCG/ACT AERO, Inhale 2 puffs into the lungs every 4 (four) hours as needed., Disp: 32.1 g, Rfl: 5   atorvastatin  (LIPITOR) 10 MG tablet, Take 1 tablet (10 mg total) by mouth daily., Disp: 90 tablet, Rfl: 3   azelastine  (ASTELIN ) 0.1 % nasal spray, Place 1 spray into both nostrils 2 (two) times daily as needed for rhinitis., Disp: 30 mL, Rfl: 1   budesonide-glycopyrrolate-formoterol (BREZTRI  AEROSPHERE) 160-9-4.8 MCG/ACT AERO inhaler, Inhale 2 puffs into the lungs 2 (two) times daily., Disp: 32.1 g, Rfl: 5   Dulaglutide  (TRULICITY ) 1.5 MG/0.5ML SOAJ, Inject 1.5 mg into the skin once a week. INJECT 1.5MG  ONCE A WEEK, Disp: 6 mL, Rfl: 3   esomeprazole  (NEXIUM ) 40 MG capsule, Take 1 capsule (40 mg total) by mouth 2 (two) times daily before a meal., Disp: 180 capsule, Rfl: 3   guaiFENesin -codeine  100-10 MG/5ML syrup, Take 5 mLs by mouth  every 6 (six) hours as needed for cough., Disp: 120 mL, Rfl: 0   nicotine  (NICODERM CQ  - DOSED IN MG/24 HOURS) 14 mg/24hr patch, Place 1 patch (14 mg total) onto the skin daily. Monht #2 (Patient not taking: Reported on 07/25/2024), Disp: 28 patch, Rfl: 0   nicotine  (NICODERM CQ  - DOSED IN MG/24 HOURS) 21 mg/24hr patch, Place 1 patch (21 mg total) onto the skin daily. Month #1 (Patient not taking: Reported on 07/25/2024), Disp: 28 patch, Rfl: 0   nicotine  (NICODERM CQ  - DOSED IN MG/24 HR) 7 mg/24hr patch, Place 1 patch (7 mg total) onto the skin daily. Month #3 (Patient not taking: Reported on 07/25/2024), Disp: 28 patch, Rfl: 0   promethazine -dextromethorphan (PROMETHAZINE -DM) 6.25-15 MG/5ML syrup, Take 2.5 mLs by mouth 4 (four) times daily as needed for cough., Disp: 118 mL, Rfl: 0  "

## 2024-10-13 NOTE — Addendum Note (Signed)
 Addended by: JOLINDA NORENE HERO on: 10/13/2024 10:43 AM   Modules accepted: Orders

## 2024-10-13 NOTE — Patient Instructions (Signed)
Concussion, Adult  A concussion is a brain injury from a hard, direct hit (trauma) to your head or body. This hit causes your brain to quickly shake back and forth inside your skull. A concussion may also be called a mild traumatic brain injury (TBI). Healing from this injury can take time. The effects of a concussion can be serious. If you have a concussion, you should be very careful to avoid having a second concussion. What are the causes? This condition is caused by: A direct hit to your head. A quick and sudden movement of the head or neck, such as in a car crash. What are the signs or symptoms? The signs of a concussion can be hard to notice. They may be missed by you, family members, and doctors. You may look fine on the outside but may not act or feel normal. Physical symptoms Headaches or feeling dizzy. Problems with body balance. Being sensitive to light or noise. Vomiting or feeling like you may vomit. Being tired. Problems seeing or hearing. Seizure. Mental and emotional symptoms Feeling grouchy (irritable) or having mood changes. Problems remembering things. Trouble focusing your mind (concentrating), organizing, or making decisions. Not sleeping or eating as you used to. Being slow to think, act, react, speak, or read. Feeling worried or nervous (anxious). Feeling sad (depressed). How is this treated? This condition may be treated by: Stopping sports or activity if you are injured. Resting your body and your mind. Being watched carefully, often at home. Medicines to help with symptoms such as: Headaches. Feeling like you may vomit. Problems with sleep. You may need to go to a concussion clinic or a place to help you recover (rehab). Follow these instructions at home: Activity Limit activities that need a lot of thought or focus, such as: Homework or work for your job. Watching TV. Using the computer or phone. Playing memory games and puzzles. Get rest because  this helps your brain heal. Make sure you: Get plenty of sleep. Most adults should get 7-9 hours of sleep each night. Rest during the day. Take naps or breaks when you feel tired. Avoid activity or exercise that takes a lot of effort until your doctor says it is safe. Stop any activity that makes symptoms worse. Your doctor may tell you to do light exercise like walking. Do not do activities that could cause a second concussion, such as riding a bike or playing sports. Ask your doctor when you can return to your normal activities, such as school, work, sports, and driving. Your ability to react may be slower. Do not do these activities if you are dizzy. General instructions  Take over-the-counter and prescription medicines only as told by your doctor. Avoid taking strong pain medicines (opioids) after a concussion. Do not drink alcohol until your doctor says you can. Watch your symptoms and tell other people to do the same. Other problems can occur after a concussion. Tell your work manager, teachers, school nurse, school counselor, coach, or athletic trainer about your injury and symptoms. Tell them about what you can or cannot do. See a mental health therapist if you keep feeling worried and nervous or sad. Keep all follow-up visits. Your doctor will check on your recovery and give you a plan for returning to activities. How is this prevented? It is very important that you do not get another brain injury. In rare cases, another injury can cause brain damage that will not go away, brain swelling, or death. The risk of this   is greatest in the first 7-10 days after a head injury. To avoid injuries: Stop activities that could lead to a second concussion, such as contact sports, until your doctor says it is okay. When you return to sports or activities: Do not crash into other players. This is how most concussions happen. Follow the rules. Respect other players. Do not engage in violent  behavior while playing. Get regular exercise. Do strength and balance training. Wear a helmet that fits you well during sports, biking, or other activities. Helmets can help protect you from serious skull and brain injuries, but they may not protect you from a concussion. Even when wearing a helmet, you should avoid being hit in the head. Where to find more information Centers for Disease Control and Prevention: cdc.gov Contact a doctor if: Your symptoms do not get better or get worse. You have new symptoms. You have another injury. Your balance gets worse. You have changes in how you act. Get help right away if: You have very bad headaches or your headaches get worse. You have any of these problems: Feeling weak or numb in any part of your body. Slurred speech. Changes in how you see (vision). Feeling mixed up (confused). You vomit often. You faint or other people have trouble waking you up. You have a seizure. These symptoms may be an emergency. Get help right away. Call 911. Do not wait to see if the symptoms will go away. Do not drive yourself to the hospital. Also, get help right away if: You have thoughts of hurting yourself or others. Take one of these steps if you feel like you may hurt yourself or others, or have thoughts about taking your own life: Go to your nearest emergency room. Call 911. Call the National Suicide Prevention Lifeline at 1-800-273-8255 or 988. This is open 24 hours a day. Text the Crisis Text Line at 741741. This information is not intended to replace advice given to you by your health care provider. Make sure you discuss any questions you have with your health care provider. Document Revised: 02/10/2022 Document Reviewed: 02/10/2022 Elsevier Patient Education  2024 Elsevier Inc.  

## 2024-10-15 ENCOUNTER — Telehealth: Payer: Self-pay

## 2024-10-15 NOTE — Progress Notes (Signed)
 Care Guide Pharmacy Note  10/15/2024 Name: Lori Schroeder MRN: [id number] DOB: 1956/04/17  Referred By: Jolinda Norene HERO, DO Reason for referral: Complex Care Management (Outreach to schedule with Pharm d )   Lori Schroeder is a 69 y.o. year old female who is a primary care patient of Jolinda Norene HERO, DO.  Lori Schroeder was referred to the pharmacist for assistance related to: DMII  Successful contact was made with the patient to discuss pharmacy services including being ready for the pharmacist to call at least 5 minutes before the scheduled appointment time and to have medication bottles and any blood pressure readings ready for review. The patient agreed to meet with the pharmacist via telephone visit on (date/time).10/29/2024  Jeoffrey Buffalo , RMA     [place]  [facility], [facility] Guide  Direct Dial: [phone]  Website: [email or web address]

## 2024-10-19 ENCOUNTER — Encounter (INDEPENDENT_AMBULATORY_CARE_PROVIDER_SITE_OTHER): Payer: Self-pay | Admitting: Ophthalmology

## 2024-10-27 ENCOUNTER — Ambulatory Visit: Admitting: Family Medicine

## 2024-10-29 ENCOUNTER — Telehealth: Payer: Self-pay | Admitting: Pharmacist

## 2024-10-29 ENCOUNTER — Other Ambulatory Visit (INDEPENDENT_AMBULATORY_CARE_PROVIDER_SITE_OTHER)

## 2024-10-29 DIAGNOSIS — E119 Type 2 diabetes mellitus without complications: Secondary | ICD-10-CM

## 2024-10-29 DIAGNOSIS — Z7985 Long-term (current) use of injectable non-insulin antidiabetic drugs: Secondary | ICD-10-CM

## 2024-10-29 MED ORDER — TRULICITY 1.5 MG/0.5ML ~~LOC~~ SOAJ: 1.5000 mg | SUBCUTANEOUS | 4 refills | Status: AC

## 2024-10-29 NOTE — Telephone Encounter (Signed)
" ° °  Please re-enroll patient in the Lilly Cares (trulicity ) PAP and the [place]&me patient assistance program for Breztri /AirSupra .  I will send updated eRXs as requested.  Patient is stable on current regimen.  Please route PAP to PharmD.  Appreciate assistance.  Danyael Alipio Dattero Garik Diamant, PharmD, BCACP, CPP Clinical Pharmacist, [facility]  "

## 2024-10-29 NOTE — Progress Notes (Signed)
 "  10/29/2024 Name: Lori Schroeder MRN: [id number] DOB: 14-Mar-1956  Chief Complaint  Patient presents with   Diabetes   Medication Assistance    Lori Schroeder is a 69 y.o. year old female who presented for a telephone visit.  I connected with  Lori Schroeder on 10/29/24 by telephone and verified that I am speaking with the correct person using two identifiers.  I discussed the limitations of evaluation and management by telemedicine. The patient expressed understanding and agreed to proceed.  Patient was located in her home and PharmD in PCP office during this visit.   They were referred to the pharmacist by their PCP for assistance in managing diabetes and medication access.    Subjective:  Patient presents to re-enroll in Lily cares PAP for Trulicity .  She reports she is stable on current regimen and has ~1 month of supply remaining.  Care Team: Primary Care Provider: Jolinda Norene HERO, DO   Medication Access/Adherence  Current Pharmacy:  [facility] [place], [place] - [address] [place] [address] Phone: [phone] Fax: [phone]  [facility] - [place], [place] - [address] [place] [address] [place] [address] Phone: [phone] Fax: [phone]  [facility] [place], [place] - [address] [place] [address] Phone: [phone] Fax: [phone]   Patient reports affordability concerns with their medications: Yes  Patient reports access/transportation concerns to their pharmacy: No  Patient reports adherence concerns with their medications:  Yes  due to cost   Diabetes:  Current medications: Trulicity  1.5mg  weekly  (stable) Medications tried in the past: metformin , Januvia   Current glucose readings: FBG<130  Current meal patterns:  Discussed meal planning options and Plate method for healthy eating Avoid sugary drinks and desserts Incorporate balanced  protein, non starchy veggies, 1 serving of carbohydrate with each meal Increase water intake Increase physical activity as able  Current physical activity: encouraged as able  Current medication access support: will enroll in the Lilly Cares PAP for Trulicity   Macrovascular and Microvascular Risk Reduction:  Statin? No;patient scheduled for pharmd f/u for HLD; ACEi/ARB? no Last urinary albumin/creatinine ratio:  Lab Results  Component Value Date   MICRALBCREAT 5 07/04/2024   MICRALBCREAT <10 05/09/2023   MICRALBCREAT <12 08/04/2022   MICRALBCREAT <8 03/29/2021   Last eye exam:  Lab Results  Component Value Date   HMDIABEYEEXA No Retinopathy 06/20/2023   Last foot exam: 07/04/2024 Tobacco Use:  Tobacco Use: High Risk (10/19/2024)   Patient History    Smoking Tobacco Use: Every Day    Smokeless Tobacco Use: Never    Passive Exposure: Not on file     Objective:  Lab Results  Component Value Date   HGBA1C 6.2 (H) 07/04/2024    Lab Results  Component Value Date   CREATININE 0.75 07/04/2024   BUN 8 07/04/2024   NA 139 07/04/2024   K 4.7 07/04/2024   CL 101 07/04/2024   CO2 23 07/04/2024    Lab Results  Component Value Date   CHOL 259 (H) 07/04/2024   HDL 54 07/04/2024   LDLCALC 179 (H) 07/04/2024   TRIG 142 07/04/2024   CHOLHDL 4.8 (H) 07/04/2024    Medications Reviewed Today     Reviewed by Fujie Dickison D, RPH-CPP (Pharmacist) on 10/29/24 at [phone]  Med List Status: <None>   Medication Order Taking? Sig Documenting Provider Last Dose  Status Informant  Albuterol -Budesonide (AIRSUPRA ) 90-80 MCG/ACT AERO [id number]  Inhale 2 puffs into the lungs every 4 (four) hours as needed. Jolinda Norene HERO, DO  Active            Med Note TENA, Tafari Humiston D   Tue Jul 08, 2024  2:49 PM) Via AZ&me patient assistance program   atorvastatin  (LIPITOR) 10 MG tablet [id number]  Take 1 tablet (10 mg total) by mouth daily. Jolinda Norene M, DO  Active   azelastine  (ASTELIN ) 0.1  % nasal spray [id number]  Place 1 spray into both nostrils 2 (two) times daily as needed for rhinitis. Jolinda Norene M, DO  Active   budesonide-glycopyrrolate-formoterol (BREZTRI  AEROSPHERE) 160-9-4.8 MCG/ACT AERO inhaler [id number]  Inhale 2 puffs into the lungs 2 (two) times daily. Jolinda Norene HERO, DO  Active            Med Note TENA CLARITY D   Tue Jul 08, 2024  2:48 PM) Via AZ&me patient assistance program   Dulaglutide  (TRULICITY ) 1.5 MG/0.5ML EMMANUEL [id number]  Inject 1.5 mg into the skin once a week. Jolinda Norene M, DO  Active   esomeprazole  (NEXIUM ) 40 MG capsule [id number]  Take 1 capsule (40 mg total) by mouth 2 (two) times daily before a meal. Jolinda Norene M, DO  Active   guaiFENesin -codeine  100-10 MG/5ML syrup [id number]  Take 5 mLs by mouth every 6 (six) hours as needed for cough.  Patient not taking: Reported on 10/13/2024   Jolinda Norene M, DO  Active   nicotine  (NICODERM CQ  - DOSED IN MG/24 HOURS) 14 mg/24hr patch [id number]  Place 1 patch (14 mg total) onto the skin daily. Monht #2  Patient not taking: Reported on 10/13/2024   Jolinda Norene HERO, DO  Active            Med Note TENA, Savanna Dooley D   Wed Jul 09, 2024  3:00 PM) Recommended Bur[place]um quit line--free patches/gum  nicotine  (NICODERM CQ  - DOSED IN MG/24 HOURS) 21 mg/24hr patch [id number]  Place 1 patch (21 mg total) onto the skin daily. Month #1  Patient not taking: Reported on 10/13/2024   Jolinda Norene M, DO  Active   nicotine  (NICODERM CQ  - DOSED IN MG/24 HR) 7 mg/24hr patch [id number]  Place 1 patch (7 mg total) onto the skin daily. Month #3  Patient not taking: Reported on 10/13/2024   Jolinda Norene HERO, DO  Active    Patient not taking:   Discontinued 10/29/24 0943 (Completed Course)              Assessment/Plan:   Diabetes: - Currently controlled; goal A1c <7%. Cardiorenal risk reduction is opportunities for improvement.. Blood pressure is at goal <130/80. LDL is not at goal.  Statin increased  will recheck lipids in April.   - Reviewed long term cardiovascular and renal outcomes of uncontrolled blood sugar. and Reviewed goal A1c, goal fasting, and goal 2 hour post prandial glucose. Recommended to check glucose fasting or if symptomatic - Recommend to continue Trulicity , will need to enroll for 2026 in the Lilly Cares PAP; still has 1 month of Trulicity  remaining   Encounter routed to CPhT to follow.  New RX escribed to [facility] pharmacy on behalf of lilly cares PAP - Patient denies personal or family history of multiple endocrine neoplasia type 2, medullary thyroid  cancer; personal history of pancreatitis or gallbladder disease., Discussed side effects of gastrointestinal upset/nausea; eating smaller meals, avoiding high-fat foods, and remaining upright after eating may reduce nausea.  Discussed that overeating is a major trigger of nausea with this class of medications, as often times patients will start to feel full sooner and may need to decrease portion sizes from what they were previously accustomed to.   Follow Up Plan: PharmD in 6-8 weeks  Mliss Tarry Griffin, PharmD, BCACP, CPP Clinical Pharmacist, [facility]   "

## 2024-11-05 ENCOUNTER — Telehealth: Payer: Self-pay

## 2024-11-05 NOTE — Telephone Encounter (Signed)
 PAP: Patient assistance re-enrollment application for Trulicity  through [employer] has been emailed to [employer]

## 2024-11-21 ENCOUNTER — Encounter (INDEPENDENT_AMBULATORY_CARE_PROVIDER_SITE_OTHER): Admitting: Ophthalmology

## 2025-01-05 ENCOUNTER — Ambulatory Visit: Payer: Self-pay | Admitting: Family Medicine

## 2025-05-06 ENCOUNTER — Ambulatory Visit: Payer: Self-pay

## 2025-07-07 ENCOUNTER — Encounter: Payer: Self-pay | Admitting: Family Medicine
